# Patient Record
Sex: Female | Born: 1970 | Race: Black or African American | Hispanic: No | Smoking: Never smoker
Health system: Southern US, Community
[De-identification: ages and names within clinical notes are randomized; demographics above are authoritative.]

## PROBLEM LIST (undated history)

## (undated) DIAGNOSIS — M419 Scoliosis, unspecified: Secondary | ICD-10-CM

## (undated) DIAGNOSIS — M1990 Unspecified osteoarthritis, unspecified site: Secondary | ICD-10-CM

## (undated) DIAGNOSIS — Z789 Other specified health status: Secondary | ICD-10-CM

## (undated) DIAGNOSIS — T8859XA Other complications of anesthesia, initial encounter: Secondary | ICD-10-CM

## (undated) DIAGNOSIS — M542 Cervicalgia: Secondary | ICD-10-CM

## (undated) DIAGNOSIS — D219 Benign neoplasm of connective and other soft tissue, unspecified: Secondary | ICD-10-CM

## (undated) DIAGNOSIS — D649 Anemia, unspecified: Secondary | ICD-10-CM

## (undated) DIAGNOSIS — R51 Headache: Secondary | ICD-10-CM

## (undated) DIAGNOSIS — T4145XA Adverse effect of unspecified anesthetic, initial encounter: Secondary | ICD-10-CM

## (undated) DIAGNOSIS — I82409 Acute embolism and thrombosis of unspecified deep veins of unspecified lower extremity: Secondary | ICD-10-CM

## (undated) HISTORY — DX: Anemia, unspecified: D64.9

## (undated) HISTORY — PX: WISDOM TOOTH EXTRACTION: SHX21

## (undated) HISTORY — DX: Cervicalgia: M54.2

## (undated) HISTORY — PX: NECK SURGERY: SHX720

---

## 1999-02-05 ENCOUNTER — Inpatient Hospital Stay (HOSPITAL_COMMUNITY): Admission: AD | Admit: 1999-02-05 | Discharge: 1999-02-05 | Payer: Self-pay | Admitting: Obstetrics

## 1999-02-08 ENCOUNTER — Inpatient Hospital Stay (HOSPITAL_COMMUNITY): Admission: AD | Admit: 1999-02-08 | Discharge: 1999-02-08 | Payer: Self-pay | Admitting: Obstetrics & Gynecology

## 1999-07-05 ENCOUNTER — Emergency Department (HOSPITAL_COMMUNITY): Admission: EM | Admit: 1999-07-05 | Discharge: 1999-07-06 | Payer: Self-pay | Admitting: *Deleted

## 1999-09-16 ENCOUNTER — Inpatient Hospital Stay (HOSPITAL_COMMUNITY): Admission: EM | Admit: 1999-09-16 | Discharge: 1999-09-16 | Payer: Self-pay | Admitting: *Deleted

## 2000-08-30 ENCOUNTER — Other Ambulatory Visit: Admission: RE | Admit: 2000-08-30 | Discharge: 2000-08-30 | Payer: Self-pay | Admitting: *Deleted

## 2001-02-21 ENCOUNTER — Other Ambulatory Visit: Admission: RE | Admit: 2001-02-21 | Discharge: 2001-02-21 | Payer: Self-pay | Admitting: Obstetrics and Gynecology

## 2002-02-27 ENCOUNTER — Other Ambulatory Visit: Admission: RE | Admit: 2002-02-27 | Discharge: 2002-02-27 | Payer: Self-pay | Admitting: Obstetrics and Gynecology

## 2003-03-05 ENCOUNTER — Other Ambulatory Visit: Admission: RE | Admit: 2003-03-05 | Discharge: 2003-03-05 | Payer: Self-pay | Admitting: Obstetrics and Gynecology

## 2003-10-23 ENCOUNTER — Emergency Department (HOSPITAL_COMMUNITY): Admission: EM | Admit: 2003-10-23 | Discharge: 2003-10-24 | Payer: Self-pay | Admitting: Emergency Medicine

## 2004-03-11 ENCOUNTER — Other Ambulatory Visit: Admission: RE | Admit: 2004-03-11 | Discharge: 2004-03-11 | Payer: Self-pay | Admitting: Obstetrics and Gynecology

## 2004-08-23 ENCOUNTER — Emergency Department (HOSPITAL_COMMUNITY): Admission: EM | Admit: 2004-08-23 | Discharge: 2004-08-24 | Payer: Self-pay | Admitting: Family Medicine

## 2004-09-26 ENCOUNTER — Ambulatory Visit (HOSPITAL_COMMUNITY): Admission: RE | Admit: 2004-09-26 | Discharge: 2004-09-26 | Payer: Self-pay | Admitting: Gastroenterology

## 2005-03-12 ENCOUNTER — Other Ambulatory Visit: Admission: RE | Admit: 2005-03-12 | Discharge: 2005-03-12 | Payer: Self-pay | Admitting: Obstetrics and Gynecology

## 2005-11-19 ENCOUNTER — Ambulatory Visit (HOSPITAL_COMMUNITY): Admission: AD | Admit: 2005-11-19 | Discharge: 2005-11-20 | Payer: Self-pay | Admitting: Obstetrics and Gynecology

## 2005-11-19 ENCOUNTER — Encounter (INDEPENDENT_AMBULATORY_CARE_PROVIDER_SITE_OTHER): Payer: Self-pay | Admitting: Specialist

## 2006-04-26 ENCOUNTER — Inpatient Hospital Stay (HOSPITAL_COMMUNITY): Admission: AD | Admit: 2006-04-26 | Discharge: 2006-04-26 | Payer: Self-pay | Admitting: Obstetrics and Gynecology

## 2006-09-12 ENCOUNTER — Emergency Department (HOSPITAL_COMMUNITY): Admission: EM | Admit: 2006-09-12 | Discharge: 2006-09-12 | Payer: Self-pay | Admitting: Emergency Medicine

## 2007-10-03 ENCOUNTER — Emergency Department (HOSPITAL_COMMUNITY): Admission: EM | Admit: 2007-10-03 | Discharge: 2007-10-03 | Payer: Self-pay | Admitting: Emergency Medicine

## 2008-03-23 HISTORY — PX: CERVICAL SPINE SURGERY: SHX589

## 2008-06-11 ENCOUNTER — Emergency Department (HOSPITAL_COMMUNITY): Admission: EM | Admit: 2008-06-11 | Discharge: 2008-06-11 | Payer: Self-pay | Admitting: Family Medicine

## 2009-01-02 ENCOUNTER — Emergency Department (HOSPITAL_COMMUNITY): Admission: EM | Admit: 2009-01-02 | Discharge: 2009-01-02 | Payer: Self-pay | Admitting: Family Medicine

## 2009-01-22 ENCOUNTER — Emergency Department (HOSPITAL_COMMUNITY): Admission: EM | Admit: 2009-01-22 | Discharge: 2009-01-22 | Payer: Self-pay | Admitting: Family Medicine

## 2009-02-01 ENCOUNTER — Encounter
Admission: RE | Admit: 2009-02-01 | Discharge: 2009-03-01 | Payer: Self-pay | Admitting: Physical Medicine and Rehabilitation

## 2009-04-03 ENCOUNTER — Inpatient Hospital Stay (HOSPITAL_COMMUNITY): Admission: RE | Admit: 2009-04-03 | Discharge: 2009-04-05 | Payer: Self-pay | Admitting: Orthopedic Surgery

## 2009-06-07 ENCOUNTER — Encounter: Admission: RE | Admit: 2009-06-07 | Discharge: 2009-06-27 | Payer: Self-pay | Admitting: Orthopedic Surgery

## 2009-06-21 ENCOUNTER — Emergency Department (HOSPITAL_COMMUNITY): Admission: EM | Admit: 2009-06-21 | Discharge: 2009-06-21 | Payer: Self-pay | Admitting: Family Medicine

## 2009-11-01 DIAGNOSIS — E669 Obesity, unspecified: Secondary | ICD-10-CM | POA: Insufficient documentation

## 2009-11-01 DIAGNOSIS — D649 Anemia, unspecified: Secondary | ICD-10-CM | POA: Insufficient documentation

## 2010-03-14 ENCOUNTER — Encounter
Admission: RE | Admit: 2010-03-14 | Discharge: 2010-03-14 | Payer: Self-pay | Source: Home / Self Care | Attending: Family Medicine | Admitting: Family Medicine

## 2010-04-21 DIAGNOSIS — I10 Essential (primary) hypertension: Secondary | ICD-10-CM | POA: Insufficient documentation

## 2010-04-23 DIAGNOSIS — E559 Vitamin D deficiency, unspecified: Secondary | ICD-10-CM | POA: Insufficient documentation

## 2010-05-23 ENCOUNTER — Inpatient Hospital Stay (INDEPENDENT_AMBULATORY_CARE_PROVIDER_SITE_OTHER)
Admission: RE | Admit: 2010-05-23 | Discharge: 2010-05-23 | Disposition: A | Discharge status: Home / Self Care | Payer: Medicaid Other | Source: Ambulatory Visit | Attending: Family Medicine | Admitting: Family Medicine

## 2010-05-23 DIAGNOSIS — S239XXA Sprain of unspecified parts of thorax, initial encounter: Secondary | ICD-10-CM

## 2010-05-29 DIAGNOSIS — M545 Low back pain, unspecified: Secondary | ICD-10-CM | POA: Insufficient documentation

## 2010-06-02 ENCOUNTER — Inpatient Hospital Stay (INDEPENDENT_AMBULATORY_CARE_PROVIDER_SITE_OTHER)
Admission: RE | Admit: 2010-06-02 | Discharge: 2010-06-02 | Disposition: A | Discharge status: Home / Self Care | Payer: Medicaid Other | Source: Ambulatory Visit | Attending: Emergency Medicine | Admitting: Emergency Medicine

## 2010-06-02 DIAGNOSIS — M545 Low back pain: Secondary | ICD-10-CM

## 2010-06-02 LAB — POCT PREGNANCY, URINE: Preg Test, Ur: NEGATIVE

## 2010-06-02 LAB — POCT URINALYSIS DIPSTICK
Ketones, ur: NEGATIVE mg/dL
Protein, ur: NEGATIVE mg/dL
Urobilinogen, UA: 0.2 mg/dL (ref 0.0–1.0)

## 2010-06-08 LAB — POCT I-STAT 4, (NA,K, GLUC, HGB,HCT): Glucose, Bld: 80 mg/dL (ref 70–99)

## 2010-08-08 NOTE — H&P (Signed)
[name], [name]               ACCOUNT NO.:  [id number]   MEDICAL RECORD NO.:  [id number]          PATIENT TYPE:  AMB   LOCATION:  SDC                           FACILITY:  [facility]   PHYSICIAN:  Hal Morales, M.D.DATE OF BIRTH:  August 23, 1970   DATE OF ADMISSION:  11/19/2005  DATE OF DISCHARGE:                                HISTORY & PHYSICAL   HISTORY OF PRESENT ILLNESS:  The patient is a 40 year old black female, para  1-0-0-1, who presents for hysteroscopy and resection of any submucosal  fibroids for the treatment of menorrhagia.  The patient has had problems  with menorrhagia since at least 2005.  At that time, she was placed on oral  contraceptive pills to try and control the amount of bleeding that she had  with her menses.  She was already noted at that time to be anemic and was on  iron.  She did not have the desired improvement on oral contraceptive pills  and was subsequently placed on Depo-Provera in hopes of having improvement  in her periods.  She still had no significant improvement in that she  continued to bleed heavily and with the Depo-Provera, unpredictably.  She  had a complete anemia workup with her primary care physician, Dr. Renae Gloss,  including blood indices and panendoscopy.  These were all within normal  limits and the blood workup showed evidence of iron-deficiency anemia.  She  had a normal TSH as well.  The patient has known about fibroids in her  uterus since 2003.  She had a followup ultrasound for evaluation of those  fibroids in January 2007, at which time 4 fibroids could be measured with 1  thought to be submucosal and measuring 2.2-cm.   The patient wishes to maintain childbearing potential, however, does not  desire pregnancy at this time.  She had her last Depo-Provera injection in  July 2007.   PAST MEDICAL HISTORY:   GYNECOLOGIC:  1. The patient underwent menarche at age 27 and had menses every month,      lasting for approximately 4  days, up until she began having difficulty      with her menses in 2005.  2. She has had normal Pap smears with her last Pap smear done December      2006.  3. She has recently had negative GC and Chlamydia cultures and has no      history of sexually transmitted infection.  4. Her only means of birth control has been condoms, birth control pills,      and Depo-Provera.   MEDICAL HISTORY:  The patient denies history of hypertension or diabetes.   FAMILY HISTORY:  Essentially negative.   SURGICAL HISTORY:  C-section in 1996.   OBSTETRICAL HISTORY:  Single pregnancy, delivered by cesarean section in  1996, without other complications of pregnancy.   CURRENT MEDICATIONS:  1. Iron.  2. Motrin 800 mg p.r.n. dysmenorrhea.  3. Vicodin p.r.n. dysmenorrhea.   SOCIAL HISTORY:  The patient works as a Social worker.  She is single.  She  does not smoke cigarettes nor does she drink  alcohol.   DRUG ALLERGIES:  1. IODINE.  2. IVP CONTRAST.   REVIEW OF SYSTEMS:  Positive for menstrual cramps and the aforementioned  menorrhagia.  The patient specifically denies any lightheadedness or  fatigue.   PHYSICAL EXAMINATION:  GENERAL:  The patient is an obese black female in no  acute distress.  VITAL SIGNS:  The blood pressure is 120/80.  LUNGS:  Clear.  HEART:  Regular rate and rhythm.  ABDOMEN:  Obese without palpable masses or organomegaly, though the  examination is limited by body habitus.  PELVIC:  EG/BUS within normal limits.  The vagina is rugose.  The cervix is  without gross lesions.  The uterus does not feel significantly enlarged  though the examination is limited by body habitus.  Adnexa no separable  masses.  Rectovaginal:  No masses.   IMPRESSION:  1. Menorrhagia which has not been responsive to oral contraceptive pills      or Depo-Provera.  2. Uterine fibroids, probably submucosal.  3. Anemia.  4. Desire to maintain fertility.   DISPOSITION:  Several discussions have  been held with the patient concerning  options for management and she has decided on hysteroscopy D&C with fibroid  resection.  The risks of anesthesia, bleeding, infection, damage to adjacent  organs, and the possibility of uterine perforation have been discussed with  the patient and she wishes to proceed with hysteroscopy D&C, fibroid  resection on November 19, 2005.      Hal Morales, M.D.  Electronically Signed     VPH/MEDQ  D:  11/18/2005  T:  11/18/2005  Job:  [id number]

## 2010-08-08 NOTE — Op Note (Signed)
[name], [name]                ACCOUNT NO.:  [id number]   MEDICAL RECORD NO.:  [id number]          PATIENT TYPE:  AMB   LOCATION:  ENDO                         FACILITY:  [facility]   PHYSICIAN:  Anselmo Rod, M.D.  DATE OF BIRTH:  [birth year]   DATE OF PROCEDURE:  09/26/2004  DATE OF DISCHARGE:                                 OPERATIVE REPORT   PROCEDURE PERFORMED:  Esophagogastroduodenoscopy.   ENDOSCOPIST:  Anselmo Rod, M.D.   INSTRUMENT USED:  Olympus video panendoscope.   INDICATIONS FOR PROCEDURE:  A 40 year old [place] American female with iron  deficiency and rectal bleeding during the prep.  Rule out peptic ulcer  disease, esophagitis, gastritis, etc.   PREPROCEDURE PREPARATION:  Informed consent was procured from the patient.  The patient fasted for eight hours prior to the procedure and prepped with a  bottle of magnesium citrate and a gallon of GoLYTELY the night prior to the  procedure.  Risks and benefits of the procedure were discussed with the  patient as well.   PREPROCEDURE PHYSICAL EXAMINATION:  VITAL SIGNS:  Stable.  NECK:  Supple.  CHEST:  Clear to auscultation.  CARDIOVASCULAR:  S1 and S2 regular.  ABDOMEN: Soft, non-tender with NABS.   DESCRIPTION OF PROCEDURE:  The patient was placed in left lateral decubitus  position, sedated with 50 mg of Demerol and 5 mg of Versed in slow  incremental doses.  Once the patient was adequately sedated and maintained  on low flow oxygen and continuous cardiac monitoring, the Olympus video  panendoscope was advanced through the mouthpiece, over the tongue and into  the esophagus under direct vision.  The entire esophagus appeared normal  with no evidence of ring, stricture, masses, esophagitis or Barrett's  mucosa.  The scope was then advanced into the stomach.  The entire gastric  mucosa and the proximal small-bowel appeared normal.  Retroflexion in the  high cardia revealed no abnormalities.   IMPRESSION:   Normal esophagogastroduodenoscopy.   RECOMMENDATIONS:  Proceed with colonoscopy at this time.  Further  recommendation will be made thereafter.       JNM/MEDQ  D:  09/26/2004  T:  09/26/2004  Job:  [id number]   cc:   Merlene Laughter. Renae Gloss, M.D.  [address]  [address]  [place]  [place] [address]  Fax: [phone]

## 2010-08-08 NOTE — Op Note (Signed)
[name], [name]                ACCOUNT NO.:  [id number]   MEDICAL RECORD NO.:  [id number]          PATIENT TYPE:  AMB   LOCATION:                               FACILITY:  [facility]   PHYSICIAN:  Anselmo Rod, M.D.  DATE OF BIRTH:  [birth year]   DATE OF PROCEDURE:  09/26/2004  DATE OF DISCHARGE:                                 OPERATIVE REPORT   PROCEDURE:  Colonoscopy and endoscopy.   ENDOSCOPIST:  Anselmo Rod, M.D.   INSTRUMENT:  Olympus videocolonoscope.   INDICATIONS FOR PROCEDURE:  A 40 year old African-American female with the  history of iron-deficiency anemia to rule out colonic polyps, masses,  etcetera.  The patient had significant rectal bleeding during the  preparation for the colonoscopy yesterday.   PREPROCEDURE PREPARATION:  Informed consent was procured from the patient.  The patient was fasted for 8 hours prior to the procedure and prepped with a  bottle of magnesium citrate in a gallon of GoLYTELY the night prior to the  procedure.  The risks and benefits of the procedure including a 10% missed  rate of cancer and polyps was discussed with her as well.   PREPROCEDURE PHYSICAL:  VITAL SIGNS: The patient with stable vital signs.  NECK:  Supple.  CHEST:  Clear to auscultation.  HEART:  S1, S2 regular.  ABDOMEN:  Soft with normal bowel sounds.   DESCRIPTION OF PROCEDURE:  The patient was placed in the left lateral  decubitus position and sedated with 50 mg of Demerol and 5 mg of Versed in  addition to the medications she received for the EGD.  Once the patient was  adequately sedated and maintained on low-flow oxygen, and continuous cardiac  monitoring; the Olympus videocolonoscope was advanced from the rectum to the  cecum.  The appendiceal orifice and ileocecal valve were visualized and  photographed.  No masses, polyps, erosions, or ulcerations; except we saw a  couple of diverticula in the right colon.  The rest of the exam was  unremarkable.   Retroflexion in the rectum revealed no small internal  hemorrhoids.  A large external hemorrhoid was seen on anal inspection which  is the source of the patient's rectal bleeding which she had during the  prep.  The patient tolerated the procedure well without complications.   IMPRESSION:  1.  A few right-sided diverticula.  2.  Small internal hemorrhoids.  3.  Large external hemorrhoid.   RECOMMENDATIONS:  1.  Serial CBCs will be done.  2.  Outpatient followup in the next 4 weeks for further recommendations.  3.  Sitz baths along with local application of Anusol HC 2.5% cream has been      advised.       JNM/MEDQ  D:  09/26/2004  T:  09/26/2004  Job:  [id number]   cc:   Merlene Laughter. Renae Gloss, M.D.  [address]  [address]  [place]  [place] [address]  Fax: [phone]

## 2010-08-08 NOTE — Op Note (Signed)
[name], [name]               ACCOUNT NO.:  [id number]   MEDICAL RECORD NO.:  [id number]          PATIENT TYPE:  OBV   LOCATION:  9309                          FACILITY:  [facility]   PHYSICIAN:  Hal Morales, M.D.DATE OF BIRTH:  01/05/1971   DATE OF PROCEDURE:  11/19/2005  DATE OF DISCHARGE:                                 OPERATIVE REPORT   PREOPERATIVE DIAGNOSES:  1. Abnormal uterine bleeding.  2. Uterine fibroids.  3. Left vulvar lesion of 2 days' duration.   POSTOPERATIVE DIAGNOSES:  1. Abnormal uterine bleeding.  2. Uterine fibroids.  3. Left vulvar lesion of 2 days' duration.   OPERATIONS:  1. Hysteroscopy.  2. Uterine fibroid resection.   SURGEON:  Dr. Dierdre Forth.   ANESTHESIA:  General orotracheal.   ESTIMATED BLOOD LOSS:  Less than 50 mL.   COMPLICATIONS:  None.   FINDINGS:  The uterus sounded to 8 cm.  The cavity contained a small,  approximately 2 cm, fibroid in the left posterior uterus.  No other lesions  were noted.   PROCEDURE:  The patient was taken to the operating room, after appropriate  identification, and placed on operating table.  After the attainment of  adequate general anesthesia, she was placed in the lithotomy position.  The  perineum and vagina were prepped with multiple layers of Techni-Care.  A red  Robinson catheter was used to empty the bladder.  Perineum was draped as a  sterile field.  A Graves' speculum was placed in the vagina and a  paracervical block achieved with a total of 10 mL of 2% Xylocaine in the 5  and 7 o'clock positions.  A single-tooth tenaculum was placed on the  anterior cervix.  The uterus was sounded.  This cervix was then dilated to  accommodate the diagnostic hysteroscope, and it was used to document the  above noted findings.  The resectoscope was then placed, after the cervix  had been further dilated, and the fibroid was resected.  A sharp curette was  then used to curet all quadrants of the uterus.   Once hemostasis was noted  to be adequate, all instruments were removed from the vagina.  There was  noted, on the left labium, an ulcerated lesion which was approximately 3 mm.  All instruments were removed from the vagina and the patient was awakened  from general anesthesia and taken to the  recovery room in satisfactory condition, having tolerated the procedure  well, with sponge and instrument counts correct.   SPECIMENS TO PATHOLOGY:  Endometrial curettings and uterine fibroids.      Hal Morales, M.D.  Electronically Signed     VPH/MEDQ  D:  11/19/2005  T:  11/20/2005  Job:  [id number]

## 2011-02-14 ENCOUNTER — Encounter: Payer: Self-pay | Admitting: Emergency Medicine

## 2011-02-14 ENCOUNTER — Emergency Department (HOSPITAL_COMMUNITY)
Admission: EM | Admit: 2011-02-14 | Discharge: 2011-02-14 | Disposition: A | Discharge status: Home / Self Care | Payer: Medicaid Other | Source: Home / Self Care | Attending: Family Medicine | Admitting: Family Medicine

## 2011-02-14 DIAGNOSIS — R609 Edema, unspecified: Secondary | ICD-10-CM

## 2011-02-14 DIAGNOSIS — R600 Localized edema: Secondary | ICD-10-CM

## 2011-02-14 MED ORDER — HYDROCODONE-ACETAMINOPHEN 5-325 MG PO TABS: ORAL_TABLET | ORAL | Status: AC

## 2011-02-14 NOTE — ED Notes (Signed)
C/o right leg pain.  No known injury.  Patient reports right leg pain onset last week, pain from the knee down.  Pain was constant.  Lower leg numb today, now more painful than numb, swollen, pulses present.  Patient and mother noted area of bruising anterior right leg, below knee.

## 2011-02-14 NOTE — ED Provider Notes (Signed)
History     CSN: [id number] Arrival date & time: 02/14/2011  2:57 PM   First MD Initiated Contact with Patient 02/14/11 1442      Chief Complaint  Patient presents with  . 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 female presenting with leg pain. The history is provided by the patient.  Leg Pain  The incident occurred more than 1 week ago. The pain is present in the right leg and right knee. The pain is moderate.    History reviewed. No pertinent past medical history.  Past Surgical History  Procedure Date  . Neck surgery     History reviewed. No pertinent family history.  History  Substance Use Topics  . Smoking status: Never Smoker   . Smokeless tobacco: Not on file  . Alcohol Use: No    OB History    Grav Para Term Preterm Abortions TAB SAB Ect Mult Living                  Review of Systems  Constitutional: Negative.   HENT: Negative.   Eyes: Negative.   Respiratory: Negative.   Cardiovascular: Negative.   Gastrointestinal: Negative.   Genitourinary: Negative.   Musculoskeletal: Positive for myalgias and arthralgias.  Skin: Negative.   Neurological: Negative.   Psychiatric/Behavioral: Negative.     Allergies  Iodine  Home Medications   Current Outpatient Rx  Name Route Sig Dispense Refill  . HYDROCODONE-ACETAMINOPHEN 5-325 MG PO TABS  Take one to two tablets every 4 to 6 hours as needed for pain 20 tablet 0    BP 116/75  Pulse 83  Temp(Src) 98.9 F (37.2 C) (Oral)  Resp 18  SpO2 99%  LMP 12/15/2010  Physical  Exam  Nursing note and vitals reviewed. Constitutional: She is oriented to person, place, and time. She appears well-developed and well-nourished.  HENT:  Head: Normocephalic and atraumatic.  Eyes: EOM are normal.  Neck: Normal range of motion.  Musculoskeletal:       Legs: Neurological: She is alert and oriented to person, place, and time.  Skin: Skin is warm and dry.    ED Course  Procedures (including critical care time)  Labs Reviewed - No data to display No results found.   1. Edema leg       MDM          Richardo Priest, MD 02/14/11 2049

## 2011-03-09 ENCOUNTER — Encounter (HOSPITAL_COMMUNITY): Payer: Self-pay | Admitting: *Deleted

## 2011-03-09 ENCOUNTER — Inpatient Hospital Stay (HOSPITAL_COMMUNITY): Payer: Medicaid Other

## 2011-03-09 ENCOUNTER — Inpatient Hospital Stay (HOSPITAL_COMMUNITY)
Admission: AD | Admit: 2011-03-09 | Discharge: 2011-03-09 | Disposition: A | Discharge status: Home / Self Care | Payer: Medicaid Other | Source: Ambulatory Visit | Attending: Obstetrics & Gynecology | Admitting: Obstetrics & Gynecology

## 2011-03-09 DIAGNOSIS — D219 Benign neoplasm of connective and other soft tissue, unspecified: Secondary | ICD-10-CM

## 2011-03-09 DIAGNOSIS — D259 Leiomyoma of uterus, unspecified: Secondary | ICD-10-CM

## 2011-03-09 DIAGNOSIS — N949 Unspecified condition associated with female genital organs and menstrual cycle: Secondary | ICD-10-CM | POA: Insufficient documentation

## 2011-03-09 DIAGNOSIS — N898 Other specified noninflammatory disorders of vagina: Secondary | ICD-10-CM

## 2011-03-09 DIAGNOSIS — I82409 Acute embolism and thrombosis of unspecified deep veins of unspecified lower extremity: Secondary | ICD-10-CM | POA: Insufficient documentation

## 2011-03-09 DIAGNOSIS — N939 Abnormal uterine and vaginal bleeding, unspecified: Secondary | ICD-10-CM

## 2011-03-09 DIAGNOSIS — N938 Other specified abnormal uterine and vaginal bleeding: Secondary | ICD-10-CM | POA: Insufficient documentation

## 2011-03-09 HISTORY — DX: Benign neoplasm of connective and other soft tissue, unspecified: D21.9

## 2011-03-09 HISTORY — DX: Acute embolism and thrombosis of unspecified deep veins of unspecified lower extremity: I82.409

## 2011-03-09 LAB — DIFFERENTIAL
Basophils Absolute: 0 10*3/uL (ref 0.0–0.1)
Eosinophils Absolute: 0.1 10*3/uL (ref 0.0–0.7)
Eosinophils Relative: 1 % (ref 0–5)

## 2011-03-09 LAB — URINALYSIS, ROUTINE W REFLEX MICROSCOPIC
Glucose, UA: NEGATIVE mg/dL
Specific Gravity, Urine: 1.025 (ref 1.005–1.030)

## 2011-03-09 LAB — PROTIME-INR: Prothrombin Time: 13.2 seconds (ref 11.6–15.2)

## 2011-03-09 LAB — CBC
MCH: 29.5 pg (ref 26.0–34.0)
MCV: 89 fL (ref 78.0–100.0)
Platelets: 208 10*3/uL (ref 150–400)
RDW: 13.2 % (ref 11.5–15.5)

## 2011-03-09 LAB — WET PREP, GENITAL
Trich, Wet Prep: NONE SEEN
Yeast Wet Prep HPF POC: NONE SEEN

## 2011-03-09 LAB — POCT PREGNANCY, URINE: Preg Test, Ur: NEGATIVE

## 2011-03-09 NOTE — Progress Notes (Signed)
Pt states heavy bleeding x2 weeks, changing pad/tampon q65minutes-1hour. Large clots noted. Denies vag d/c changes. Lower abd pain rates 10/10.

## 2011-03-09 NOTE — Progress Notes (Signed)
States bleeding started when IUD removed 2 wks ago. Had for 2 years and was placed by Dr. Pennie Rushing. Removed by Dr. Altamese Dilling.

## 2011-03-09 NOTE — ED Provider Notes (Signed)
History     CSN: [id number] Arrival date & time: 03/09/2011 11:11 AM   None     Chief Complaint  Patient presents with  . Vaginal Bleeding    HPI [name]  Is a 40 y.o. female who presents to [facility] for vaginal bleeding. Last sexual intercourse 4 months ago. Current sex partner x 2 years. No history of STD's. She has used IUD for birth control for almost 3 years. Last month she went to Urgent Care for pain and swelling in her right calf. She was scheduled for doppler study and found to have a DVT. She followed up with her PCP and had the IUD removed and started on Coumadin 2 weeks ago. Since that time she has had vaginal bleeding. She has a history of uterine fibroid that was removed by Dr. Pennie Rushing a few years ago and was having irregular bleeding at that time. Her PCP instructed her to come to [facility] for evaluation since she does not currently have a GYN. The history was provided by the patient.  Past Medical History  Diagnosis Date  . DVT (deep venous thrombosis)   . Fibroids     Past Surgical History  Procedure Date  . Neck surgery   . Cervical spine surgery   . Myomectomy   . Cesarean section   . Wisdom tooth extraction     Family History  Problem Relation Age of Onset  . Anesthesia problems Neg Hx     History  Substance Use Topics  . Smoking status: Never Smoker   . Smokeless tobacco: Never Used  . Alcohol Use: No    OB History    Grav Para Term Preterm Abortions TAB SAB Ect Mult Living   2 1 1  0 1 0 1 0 0 1      Review of Systems  Constitutional: Negative for fever, chills, diaphoresis and fatigue.       Obese   HENT: Negative for ear pain, congestion, sore throat, facial swelling, neck pain, neck stiffness, dental problem and sinus pressure.   Eyes: Negative for photophobia, pain and discharge.  Respiratory: Negative for cough, chest tightness and wheezing.   Cardiovascular: Positive for leg swelling. Negative for chest pain and  palpitations.  Gastrointestinal: Negative for nausea, vomiting, diarrhea, constipation and abdominal distention.       Occasional cramping  Genitourinary: Positive for vaginal bleeding. Negative for dysuria, frequency, flank pain and difficulty urinating.  Musculoskeletal: Negative for myalgias, back pain and gait problem.  Skin: Negative for color change and rash.  Neurological: Negative for dizziness, speech difficulty, weakness, light-headedness, numbness and headaches.  Psychiatric/Behavioral: Negative for confusion and agitation. The patient is not nervous/anxious.     Allergies  Iodine and Shrimp  Home Medications  No current outpatient prescriptions on file.  BP 123/82  Pulse 69  Temp(Src) 98.2 F (36.8 C) (Oral)  Resp 16  Ht 5\' 5"  (1.651 m)  Wt 235 lb 4 oz (106.709 kg)  BMI 39.15 kg/m2  SpO2 97%  LMP 02/21/2011  Physical Exam  Nursing note and vitals reviewed. Constitutional: She is oriented to person, place, and time. She appears well-developed and well-nourished.  HENT:  Head: Normocephalic.  Eyes: EOM are normal.  Neck: Neck supple.  Cardiovascular: Normal rate.   Pulmonary/Chest: Effort normal. No respiratory distress.  Abdominal: Soft. There is no tenderness.  Genitourinary:       External genitalia without lesions. Small amount of blood in vaginal vault. No CMT, no adnexal  mass or tenderness palpated. Uterus non tender.  Musculoskeletal: Normal range of motion. She exhibits no edema.  Neurological: She is alert and oriented to person, place, and time. No cranial nerve deficit.  Skin: Skin is warm and dry.  Psychiatric: She has a normal mood and affect. Her behavior is normal. Judgment and thought content normal.   Results for orders placed during the hospital encounter of 03/09/11 (from the past 24 hour(s))  URINALYSIS, ROUTINE W REFLEX MICROSCOPIC     Status: Abnormal   Collection Time   03/09/11 11:45 AM      Component Value Range   Color, Urine YELLOW   YELLOW    APPearance HAZY (*) CLEAR    Specific Gravity, Urine 1.025  1.005 - 1.030    pH 6.0  5.0 - 8.0    Glucose, UA NEGATIVE  NEGATIVE (mg/dL)   Hgb urine dipstick LARGE (*) NEGATIVE    Bilirubin Urine NEGATIVE  NEGATIVE    Ketones, ur NEGATIVE  NEGATIVE (mg/dL)   Protein, ur NEGATIVE  NEGATIVE (mg/dL)   Urobilinogen, UA 2.0 (*) 0.0 - 1.0 (mg/dL)   Nitrite NEGATIVE  NEGATIVE    Leukocytes, UA NEGATIVE  NEGATIVE   URINE MICROSCOPIC-ADD ON     Status: Abnormal   Collection Time   03/09/11 11:45 AM      Component Value Range   Squamous Epithelial / LPF FEW (*) RARE    Crystals CA OXALATE CRYSTALS (*) NEGATIVE   POCT PREGNANCY, URINE     Status: Normal   Collection Time   03/09/11 11:53 AM      Component Value Range   Preg Test, Ur NEGATIVE    WET PREP, GENITAL     Status: Abnormal   Collection Time   03/09/11 12:09 PM      Component Value Range   Yeast, Wet Prep NONE SEEN  NONE SEEN    Trich, Wet Prep NONE SEEN  NONE SEEN    Clue Cells, Wet Prep FEW (*) NONE SEEN    WBC, Wet Prep HPF POC NONE SEEN  NONE SEEN   CBC     Status: Abnormal   Collection Time   03/09/11 12:12 PM      Component Value Range   WBC 5.4  4.0 - 10.5 (K/uL)   RBC 3.83 (*) 3.87 - 5.11 (MIL/uL)   Hemoglobin 11.3 (*) 12.0 - 15.0 (g/dL)   HCT 40.9 (*) 81.1 - 46.0 (%)   MCV 89.0  78.0 - 100.0 (fL)   MCH 29.5  26.0 - 34.0 (pg)   MCHC 33.1  30.0 - 36.0 (g/dL)   RDW 91.4  78.2 - 95.6 (%)   Platelets 208  150 - 400 (K/uL)  DIFFERENTIAL     Status: Normal   Collection Time   03/09/11 12:12 PM      Component Value Range   Neutrophils Relative 58  43 - 77 (%)   Neutro Abs 3.1  1.7 - 7.7 (K/uL)   Lymphocytes Relative 30  12 - 46 (%)   Lymphs Abs 1.6  0.7 - 4.0 (K/uL)   Monocytes Relative 11  3 - 12 (%)   Monocytes Absolute 0.6  0.1 - 1.0 (K/uL)   Eosinophils Relative 1  0 - 5 (%)   Eosinophils Absolute 0.1  0.0 - 0.7 (K/uL)   Basophils Relative 1  0 - 1 (%)   Basophils Absolute 0.0  0.0 - 0.1 (K/uL)    PROTIME-INR     Status:  Normal   Collection Time   03/09/11 12:12 PM      Component Value Range   Prothrombin Time 13.2  11.6 - 15.2 (seconds)   INR 0.98  0.00 - 1.49     US Transvaginal Non-ob  03/09/2011  *RADIOLOGY REPORT*  Clinical Data: Bleeding for 2 weeks with clots post Mirena removed 02/28/2011  TRANSABDOMINAL AND TRANSVAGINAL ULTRASOUND OF PELVIS Technique:  Both transabdominal and transvaginal ultrasound examinations of the pelvis were performed. Transabdominal technique was performed for global imaging of the pelvis including uterus, ovaries, adnexal regions, and pelvic cul-de-sac.  Comparison: None.   It was necessary to proceed with endovaginal exam following the transabdominal exam to visualize the myometrium, endometrium and adnexa.  Findings:  Uterus: Has a sagittal length of 9.0 cm, AP depth of 4.8 cm and a transverse width of 5.6 cm.  Several focal fibroids are identified with the largest mural, located in the left fundal region measuring 2.6 x 2.6 x 3.6 cm.  Two smaller mural fibroids are identified in the right lateral upper uterine segment measuring 1.8 x 1.7 x 2.1 cm and in the right lateral lower uterine segment measuring 1.9 x 1.8 x 1.7 cm.  Endometrium: Appears thin and echogenic with an AP width of 2.9 mm. No areas of focal thickening or heterogeneity are seen.  Right ovary:  Has a normal appearance measuring 2.3 x 1.3 x 2.0 cm  Left ovary: Has a normal appearance measuring 2.2 x 1.7 x 1.4 cm  Other findings: No pelvic fluid or separate adnexal masses are seen.  IMPRESSION: Fibroid uterus with fibroid sizes and locations as noted above.  Normal endometrial stripe and ovaries.  Original Report Authenticated By: Bertha Stakes, M.D.   US Pelvis Complete  03/09/2011  *RADIOLOGY REPORT*  Clinical Data: Bleeding for 2 weeks with clots post Mirena removed 02/28/2011  TRANSABDOMINAL AND TRANSVAGINAL ULTRASOUND OF PELVIS Technique:  Both transabdominal and transvaginal  ultrasound examinations of the pelvis were performed. Transabdominal technique was performed for global imaging of the pelvis including uterus, ovaries, adnexal regions, and pelvic cul-de-sac.  Comparison: None.   It was necessary to proceed with endovaginal exam following the transabdominal exam to visualize the myometrium, endometrium and adnexa.  Findings:  Uterus: Has a sagittal length of 9.0 cm, AP depth of 4.8 cm and a transverse width of 5.6 cm.  Several focal fibroids are identified with the largest mural, located in the left fundal region measuring 2.6 x 2.6 x 3.6 cm.  Two smaller mural fibroids are identified in the right lateral upper uterine segment measuring 1.8 x 1.7 x 2.1 cm and in the right lateral lower uterine segment measuring 1.9 x 1.8 x 1.7 cm.  Endometrium: Appears thin and echogenic with an AP width of 2.9 mm. No areas of focal thickening or heterogeneity are seen.  Right ovary:  Has a normal appearance measuring 2.3 x 1.3 x 2.0 cm  Left ovary: Has a normal appearance measuring 2.2 x 1.7 x 1.4 cm  Other findings: No pelvic fluid or separate adnexal masses are seen.  IMPRESSION: Fibroid uterus with fibroid sizes and locations as noted above.  Normal endometrial stripe and ovaries.  Original Report Authenticated By: Bertha Stakes, M.D.    ED Course: discussed with Dr. Debroah Loop  Procedures   Assessment: Uterine fibroids   Abnormal vaginal bleeding   DVT right leg (currently under treatment)  Plan:  Follow up with GYN Clinic   Return here as needed. MDM  [place], NP 03/09/11 [address], NP 03/09/11 1446

## 2011-03-10 LAB — GC/CHLAMYDIA PROBE AMP, GENITAL
Chlamydia, DNA Probe: NEGATIVE
GC Probe Amp, Genital: NEGATIVE

## 2011-03-20 ENCOUNTER — Encounter: Payer: Self-pay | Admitting: Obstetrics & Gynecology

## 2011-03-20 ENCOUNTER — Ambulatory Visit (INDEPENDENT_AMBULATORY_CARE_PROVIDER_SITE_OTHER): Payer: Medicaid Other | Admitting: Obstetrics & Gynecology

## 2011-03-20 VITALS — BP 120/82 | HR 85 | Temp 97.1°F | Ht 65.0 in | Wt 234.9 lb

## 2011-03-20 DIAGNOSIS — D259 Leiomyoma of uterus, unspecified: Secondary | ICD-10-CM

## 2011-03-20 NOTE — Patient Instructions (Signed)
Fibroids You have been diagnosed as having a fibroid. Fibroids are smooth muscle lumps (tumors) which can occur any place in a woman's body. They are usually in the womb (uterus). The most common problem (symptom) of fibroids is bleeding. Over time this may cause low red blood cells (anemia). Other symptoms include feelings of pressure and pain in the pelvis. The diagnosis (learning what is wrong) of fibroids is made by physical exam. Sometimes tests such as an ultrasound are used. This is helpful when fibroids are felt around the ovaries and to look for tumors. TREATMENT   Most fibroids do not need surgical or medical treatment. Sometimes a tissue sample (biopsy) of the lining of the uterus is done to rule out cancer. If there is no cancer and only a small amount of bleeding, the problem can be watched.   Hormonal treatment can improve the problem.   When surgery is needed, it can consist of removing the fibroid. Vaginal birth may not be possible after the removal of fibroids. This depends on where they are and the extent of surgery. When pregnancy occurs with fibroids it is usually normal.   Your caregiver can help decide which treatments are best for you.  HOME CARE INSTRUCTIONS   Do not use aspirin as this may increase bleeding problems.   If your periods (menses) are heavy, record the number of pads or tampons used per month. Bring this information to your caregiver. This can help them determine the best treatment for you.  SEEK IMMEDIATE MEDICAL CARE IF:  You have pelvic pain or cramps not controlled with medications, or experience a sudden increase in pain.   You have an increase of pelvic bleeding between and during menses.   You feel lightheaded or have fainting spells.   You develop worsening belly (abdominal) pain.  Document Released: 03/06/2000 Document Revised: 11/19/2010 Document Reviewed: 10/27/2007 [employer] Patient Information 2012 [employer]. 

## 2011-03-20 NOTE — Progress Notes (Signed)
Subjective:    Patient ID: [name], female    DOB: 02-26-1971, 40 y.o.   MRN: [id number]  HPI Patient's last menstrual period was 02/21/2011. [id number] The patient comes today after having her Mirena removed about 2 weeks ago. She bled up until yesterday after that was removed. She also had some lower abdominal pain. She says she is currently not sexually active. She doesn't need any other birth control. Past Medical History  Diagnosis Date  . DVT (deep venous thrombosis)   . Fibroids   . Anemia    Past Surgical History  Procedure Date  . Neck surgery   . Cervical spine surgery   . Myomectomy   . Cesarean section   . Wisdom tooth extraction    Current Outpatient Prescriptions on File Prior to Visit  Medication Sig Dispense Refill  . IRON PO Take 1 tablet by mouth 2 (two) times daily.        Marland Kitchen warfarin (COUMADIN) 5 MG tablet Take 5 mg by mouth at bedtime. Pt takes 5mg  only on Monday, Friday, Saturday & Sundays        Allergies  Allergen Reactions  . Iodine Hives  . Shrimp (Shellfish Allergy) Hives   Family History  Problem Relation Age of Onset  . Anesthesia problems Neg Hx   . Hypertension Maternal Grandmother    History   Social History  . Marital Status: Legally Separated    Spouse Name: N/A    Number of Children: N/A  . Years of Education: N/A   Occupational History  . Not on file.   Social History Main Topics  . Smoking status: Never Smoker   . Smokeless tobacco: Never Used  . Alcohol Use: No  . Drug Use: No  . Sexually Active: Not Currently    Birth Control/ Protection: None     IUD removed 2 wks ago.   Other Topics Concern  . Not on file   Social History Narrative  . No narrative on file   Patient is a nonsmoker and she denies alcohol drug use.      Review of Systems Patient states that with the Mirena in place she would have to menstrual periods a month. No bleeding now. Still has some slight lower abdominal pain. No abnormal  discharge.                             Objective:   Physical Exam Filed Vitals:   03/20/11 1017  Height: 5\' 5"  (1.651 m)  Weight: 234 lb 14.4 oz (106.55 kg)   no acute distress normal affect Pel  *RADIOLOGY REPORT*  Clinical Data: Bleeding for 2 weeks with clots post Mirena removed  02/28/2011  TRANSABDOMINAL AND TRANSVAGINAL ULTRASOUND OF PELVIS  Technique: Both transabdominal and transvaginal ultrasound  examinations of the pelvis were performed. Transabdominal technique  was performed for global imaging of the pelvis including uterus,  ovaries, adnexal regions, and pelvic cul-de-sac.  Comparison: None.  It was necessary to proceed with endovaginal exam following the  transabdominal exam to visualize the myometrium, endometrium and  adnexa.  Findings:  Uterus: Has a sagittal length of 9.0 cm, AP depth of 4.8 cm and a  transverse width of 5.6 cm. Several focal fibroids are identified  with the largest mural, located in the left fundal region measuring  2.6 x 2.6 x 3.6 cm. Two smaller mural fibroids are identified in  the right lateral upper uterine segment  measuring 1.8 x 1.7 x 2.1  cm and in the right lateral lower uterine segment measuring 1.9 x  1.8 x 1.7 cm.  Endometrium: Appears thin and echogenic with an AP width of 2.9 mm.  No areas of focal thickening or heterogeneity are seen.  Right ovary: Has a normal appearance measuring 2.3 x 1.3 x 2.0 cm  Left ovary: Has a normal appearance measuring 2.2 x 1.7 x 1.4 cm  Other findings: No pelvic fluid or separate adnexal masses are  seen.  IMPRESSION:  Fibroid uterus with fibroid sizes and locations as noted above.  Normal endometrial stripe and ovaries.  Original Report Authenticated By: Bertha Stakes, M.D.      vic: External genitalia vagina and cervix appeared normal. Uterus is about 4-6 weeks size no adnexal masses or tenderness.         Assessment & Plan:  Patient has several  intramural fibroids no submucous fibroid on ultrasound. She was usually diagnosed with a blood clot in her right leg patient describes this as being superficial pretibial area. She is currently on Coumadin. She recently had her Mirena removed. She needs never control currently. I asked her to keep a menstrual calendar. She'll followup with her primary care doctors and she may request a consult from a vein specialist. To return here in 2 months. Dr. Scheryl Darter 03/20/2011

## 2011-03-24 HISTORY — PX: MYOMECTOMY: SHX85

## 2011-04-05 ENCOUNTER — Emergency Department (HOSPITAL_COMMUNITY)
Admission: EM | Admit: 2011-04-05 | Discharge: 2011-04-06 | Disposition: A | Discharge status: Home / Self Care | Payer: Medicaid Other | Attending: Emergency Medicine | Admitting: Emergency Medicine

## 2011-04-05 ENCOUNTER — Emergency Department (HOSPITAL_COMMUNITY): Payer: Medicaid Other

## 2011-04-05 ENCOUNTER — Encounter (HOSPITAL_COMMUNITY): Payer: Self-pay | Admitting: *Deleted

## 2011-04-05 DIAGNOSIS — R070 Pain in throat: Secondary | ICD-10-CM | POA: Insufficient documentation

## 2011-04-05 DIAGNOSIS — R05 Cough: Secondary | ICD-10-CM | POA: Insufficient documentation

## 2011-04-05 DIAGNOSIS — Z79899 Other long term (current) drug therapy: Secondary | ICD-10-CM | POA: Insufficient documentation

## 2011-04-05 DIAGNOSIS — R0602 Shortness of breath: Secondary | ICD-10-CM | POA: Insufficient documentation

## 2011-04-05 DIAGNOSIS — R059 Cough, unspecified: Secondary | ICD-10-CM | POA: Insufficient documentation

## 2011-04-05 DIAGNOSIS — N898 Other specified noninflammatory disorders of vagina: Secondary | ICD-10-CM | POA: Insufficient documentation

## 2011-04-05 DIAGNOSIS — M545 Low back pain, unspecified: Secondary | ICD-10-CM | POA: Insufficient documentation

## 2011-04-05 DIAGNOSIS — Z9889 Other specified postprocedural states: Secondary | ICD-10-CM | POA: Insufficient documentation

## 2011-04-05 DIAGNOSIS — R509 Fever, unspecified: Secondary | ICD-10-CM | POA: Insufficient documentation

## 2011-04-05 DIAGNOSIS — B9789 Other viral agents as the cause of diseases classified elsewhere: Secondary | ICD-10-CM | POA: Insufficient documentation

## 2011-04-05 DIAGNOSIS — J988 Other specified respiratory disorders: Secondary | ICD-10-CM

## 2011-04-05 DIAGNOSIS — Z86718 Personal history of other venous thrombosis and embolism: Secondary | ICD-10-CM | POA: Insufficient documentation

## 2011-04-05 MED ORDER — HYDROCODONE-ACETAMINOPHEN 5-325 MG PO TABS
2.0000 | ORAL_TABLET | Freq: Once | ORAL | Status: AC
  Administered 2011-04-05: 2 via ORAL
  Filled 2011-04-05: qty 2

## 2011-04-05 MED ORDER — HYDROMORPHONE HCL PF 2 MG/ML IJ SOLN
2.0000 mg | Freq: Once | INTRAMUSCULAR | Status: AC
  Administered 2011-04-05: 2 mg via INTRAMUSCULAR

## 2011-04-05 MED ORDER — ALBUTEROL SULFATE HFA 108 (90 BASE) MCG/ACT IN AERS
2.0000 | INHALATION_SPRAY | RESPIRATORY_TRACT | Status: DC | PRN
  Administered 2011-04-05: 2 via RESPIRATORY_TRACT
  Filled 2011-04-05: qty 6.7

## 2011-04-05 MED ORDER — HYDROMORPHONE HCL PF 2 MG/ML IJ SOLN
INTRAMUSCULAR | Status: AC
  Administered 2011-04-05: 2 mg via INTRAMUSCULAR
  Filled 2011-04-05: qty 1

## 2011-04-05 NOTE — ED Provider Notes (Signed)
Subjective patient with vomiting multiple times most likely secondary to gastroparesis Objective alert no distress Glasgow Coma Score 15 appears comfortable Assessment based on laboratory value a hypoglycemic episode in the emergency department ., Patient is mildly dehydrated Plan CDU protocol for dehydration, frequent Accu-Chek   Doug Sou, MD 04/05/11 2145

## 2011-04-05 NOTE — ED Notes (Signed)
Patient complaining of lower back pain; states that she came to the ED to rule out pneumonia.  Patient states that the lower back pain began this morning; rates pain 10/10 on the numerical pain scale.  Describes pain as "aching" and "sharp".  Complaining of cough and flu-like symptoms over the last week.  Patient given mask to wear.  Patient states that she has taken Flexeril, so she feels that it is not related to muscle-type pain.  Requesting x-ray of her lower back; patient denies injury to her lower back.  Patient alert and oriented x4; PERRL present.  Will continue to monitor.

## 2011-04-05 NOTE — ED Notes (Signed)
Patient currently sitting up in bed; no respiratory or acute distress noted.  Updated patient on plan of care; informed patient that we are currently waiting on EDP to write orders.  Patient has no other questions or concerns at this time; will continue to monitor.

## 2011-04-05 NOTE — ED Notes (Signed)
She has pain in her lower back since this am when she woke up.  She has had a cough for one week also

## 2011-04-05 NOTE — ED Provider Notes (Signed)
Please void note from 04/05/2011 at 9:43 PM in correct patient  Doug Sou, MD 04/05/11 2146

## 2011-04-05 NOTE — ED Provider Notes (Addendum)
History     CSN: [id number]  Arrival date & time 04/05/11  [name]   First MD Initiated Contact with Patient 04/05/11 2041      Chief Complaint  Patient presents with  . Back Pain    (Consider location/radiation/quality/duration/timing/severity/associated sxs/prior treatment) HPI Complains of nonproductive cough with fever for onset 3 days ago also complains of low nonradiating back pain onset 2 days ago .also complains a sore throat. No other complaint  Back pain is worse with moving improved with lying still no incontinence, no radiation no other associated symptoms pain is moderate to severe. She has treated herself with tussin and Tylenol without adequate relief Past Medical History  Diagnosis Date  . DVT (deep venous thrombosis)   . Fibroids   . Anemia     Past Surgical History  Procedure Date  . Neck surgery   . Cervical spine surgery   . Myomectomy   . Cesarean section   . Wisdom tooth extraction     Family History  Problem Relation Age of Onset  . Anesthesia problems Neg Hx   . Hypertension Maternal Grandmother     History  Substance Use Topics  . Smoking status: Never Smoker   . Smokeless tobacco: Never Used  . Alcohol Use: No    OB History    Grav Para Term Preterm Abortions TAB SAB Ect Mult Living   2 1 1  0 1 0 1 0 0 1      Review of Systems  Constitutional: Negative.   HENT: Negative.        Sore throat  Respiratory: Positive for cough.   Cardiovascular: Negative.   Gastrointestinal: Negative.   Musculoskeletal: Positive for back pain.  Skin: Negative.   Neurological: Negative.   Hematological: Negative.   Psychiatric/Behavioral: Negative.   All other systems reviewed and are negative.    Allergies  Iodine and Shrimp  Home Medications   Current Outpatient Rx  Name Route Sig Dispense Refill  . CYCLOBENZAPRINE HCL 5 MG PO TABS Oral Take 5 mg by mouth daily as needed. For muscle spasms    . IRON PO Oral Take 1 tablet by mouth 2 (two)  times daily.       BP 122/70  Pulse 97  Temp(Src) 98.4 F (36.9 C) (Oral)  Resp 16  SpO2 100%  LMP 04/01/2011  Physical Exam  Nursing note and vitals reviewed. Constitutional: She appears well-developed and well-nourished.  HENT:  Head: Normocephalic and atraumatic.  Eyes: Conjunctivae are normal. Pupils are equal, round, and reactive to light.  Neck: Neck supple. No tracheal deviation present. No thyromegaly present.  Cardiovascular: Normal rate and regular rhythm.   No murmur heard. Pulmonary/Chest: Effort normal. No respiratory distress.       Scant diffuse rhonchi  Abdominal: Soft. Bowel sounds are normal. She exhibits no distension. There is no tenderness.       OBese  Musculoskeletal: Normal range of motion. She exhibits no edema and no tenderness.       Entire spine nontender, and planes of pain at lumbar back when sitting up from a supine position  Neurological: She is alert. She displays normal reflexes. No cranial nerve deficit. Coordination normal.  Skin: Skin is warm and dry. No rash noted.  Psychiatric: She has a normal mood and affect.   gait is normal  ED Course  Procedures (including critical care time) Feels improved after treatment withLabs Reviewed - No data to display No results found. Intramuscular dilaudid and hydrocodone-A.  Pap No diagnosis found.  Results for orders placed during the hospital encounter of 03/09/11  URINALYSIS, ROUTINE W REFLEX MICROSCOPIC      Component Value Range   Color, Urine YELLOW  YELLOW    APPearance HAZY (*) CLEAR    Specific Gravity, Urine 1.025  1.005 - 1.030    pH 6.0  5.0 - 8.0    Glucose, UA NEGATIVE  NEGATIVE (mg/dL)   Hgb urine dipstick LARGE (*) NEGATIVE    Bilirubin Urine NEGATIVE  NEGATIVE    Ketones, ur NEGATIVE  NEGATIVE (mg/dL)   Protein, ur NEGATIVE  NEGATIVE (mg/dL)   Urobilinogen, UA 2.0 (*) 0.0 - 1.0 (mg/dL)   Nitrite NEGATIVE  NEGATIVE    Leukocytes, UA NEGATIVE  NEGATIVE   POCT PREGNANCY, URINE        Component Value Range   Preg Test, Ur NEGATIVE    GC/CHLAMYDIA PROBE AMP, GENITAL      Component Value Range   GC Probe Amp, Genital NEGATIVE  NEGATIVE    Chlamydia, DNA Probe NEGATIVE  NEGATIVE   WET PREP, GENITAL      Component Value Range   Yeast, Wet Prep NONE SEEN  NONE SEEN    Trich, Wet Prep NONE SEEN  NONE SEEN    Clue Cells, Wet Prep FEW (*) NONE SEEN    WBC, Wet Prep HPF POC NONE SEEN  NONE SEEN   CBC      Component Value Range   WBC 5.4  4.0 - 10.5 (K/uL)   RBC 3.83 (*) 3.87 - 5.11 (MIL/uL)   Hemoglobin 11.3 (*) 12.0 - 15.0 (g/dL)   HCT 46.9 (*) 62.9 - 46.0 (%)   MCV 89.0  78.0 - 100.0 (fL)   MCH 29.5  26.0 - 34.0 (pg)   MCHC 33.1  30.0 - 36.0 (g/dL)   RDW 52.8  41.3 - 24.4 (%)   Platelets 208  150 - 400 (K/uL)  DIFFERENTIAL      Component Value Range   Neutrophils Relative 58  43 - 77 (%)   Neutro Abs 3.1  1.7 - 7.7 (K/uL)   Lymphocytes Relative 30  12 - 46 (%)   Lymphs Abs 1.6  0.7 - 4.0 (K/uL)   Monocytes Relative 11  3 - 12 (%)   Monocytes Absolute 0.6  0.1 - 1.0 (K/uL)   Eosinophils Relative 1  0 - 5 (%)   Eosinophils Absolute 0.1  0.0 - 0.7 (K/uL)   Basophils Relative 1  0 - 1 (%)   Basophils Absolute 0.0  0.0 - 0.1 (K/uL)  PROTIME-INR      Component Value Range   Prothrombin Time 13.2  11.6 - 15.2 (seconds)   INR 0.98  0.00 - 1.49   URINE MICROSCOPIC-ADD ON      Component Value Range   Squamous Epithelial / LPF FEW (*) RARE    Crystals CA OXALATE CRYSTALS (*) NEGATIVE    Dg Chest 2 View  04/05/2011  *RADIOLOGY REPORT*  Clinical Data: Shortness of breath, cough, fever  CHEST - 2 VIEW  Comparison: None.  Findings: Decreased lung volumes with basilar atelectasis.  Right hemidiaphragm elevated.  Negative for pneumonia, collapse, consolidation, effusion or pneumothorax.  Lower cervical fusion hardware noted.  Trachea midline.  IMPRESSION: Bibasilar atelectasis.  Low volume exam.  Original Report Authenticated By: Judie Petit. Ruel Favors, M.D.   US  Transvaginal Non-ob  03/09/2011  *RADIOLOGY REPORT*  Clinical Data: Bleeding for 2 weeks with clots post Mirena removed  02/28/2011  TRANSABDOMINAL AND TRANSVAGINAL ULTRASOUND OF PELVIS Technique:  Both transabdominal and transvaginal ultrasound examinations of the pelvis were performed. Transabdominal technique was performed for global imaging of the pelvis including uterus, ovaries, adnexal regions, and pelvic cul-de-sac.  Comparison: None.   It was necessary to proceed with endovaginal exam following the transabdominal exam to visualize the myometrium, endometrium and adnexa.  Findings:  Uterus: Has a sagittal length of 9.0 cm, AP depth of 4.8 cm and a transverse width of 5.6 cm.  Several focal fibroids are identified with the largest mural, located in the left fundal region measuring 2.6 x 2.6 x 3.6 cm.  Two smaller mural fibroids are identified in the right lateral upper uterine segment measuring 1.8 x 1.7 x 2.1 cm and in the right lateral lower uterine segment measuring 1.9 x 1.8 x 1.7 cm.  Endometrium: Appears thin and echogenic with an AP width of 2.9 mm. No areas of focal thickening or heterogeneity are seen.  Right ovary:  Has a normal appearance measuring 2.3 x 1.3 x 2.0 cm  Left ovary: Has a normal appearance measuring 2.2 x 1.7 x 1.4 cm  Other findings: No pelvic fluid or separate adnexal masses are seen.  IMPRESSION: Fibroid uterus with fibroid sizes and locations as noted above.  Normal endometrial stripe and ovaries.  Original Report Authenticated By: Bertha Stakes, M.D.   US Pelvis Complete  03/09/2011  *RADIOLOGY REPORT*  Clinical Data: Bleeding for 2 weeks with clots post Mirena removed 02/28/2011  TRANSABDOMINAL AND TRANSVAGINAL ULTRASOUND OF PELVIS Technique:  Both transabdominal and transvaginal ultrasound examinations of the pelvis were performed. Transabdominal technique was performed for global imaging of the pelvis including uterus, ovaries, adnexal regions, and pelvic cul-de-sac.   Comparison: None.   It was necessary to proceed with endovaginal exam following the transabdominal exam to visualize the myometrium, endometrium and adnexa.  Findings:  Uterus: Has a sagittal length of 9.0 cm, AP depth of 4.8 cm and a transverse width of 5.6 cm.  Several focal fibroids are identified with the largest mural, located in the left fundal region measuring 2.6 x 2.6 x 3.6 cm.  Two smaller mural fibroids are identified in the right lateral upper uterine segment measuring 1.8 x 1.7 x 2.1 cm and in the right lateral lower uterine segment measuring 1.9 x 1.8 x 1.7 cm.  Endometrium: Appears thin and echogenic with an AP width of 2.9 mm. No areas of focal thickening or heterogeneity are seen.  Right ovary:  Has a normal appearance measuring 2.3 x 1.3 x 2.0 cm  Left ovary: Has a normal appearance measuring 2.2 x 1.7 x 1.4 cm  Other findings: No pelvic fluid or separate adnexal masses are seen.  IMPRESSION: Fibroid uterus with fibroid sizes and locations as noted above.  Normal endometrial stripe and ovaries.  Original Report Authenticated By: Bertha Stakes, M.D.     MDM  X-rays of lumbar spine not indicated as patient has no red flags for back pain. Discussed with patient and her mother who agree. Back pain felt to be muscular in etiology. Suspect influenza as etiology of cough Plan prescription and Percocet Albuterol inhaler to go to use 2 puffs every 4 hours when necessary cough or shortness of breath Follow up with Dr. Renae Gloss if not improving in 3 or 4 day Diagnosis #1 viral respiratory illness #2 low back pain        Doug Sou, MD 04/06/11 0004  Doug Sou, MD 04/06/11 0009

## 2011-04-05 NOTE — ED Notes (Signed)
Dr. Jacubowitz at bedside 

## 2011-04-05 NOTE — ED Notes (Signed)
Patient back from x-ray; currently sitting up in bed; no respiratory or acute distress noted.  Patient updated on plan of care; informed patient that we are currently waiting on x-ray results.  Patient has no other questions or concerns at this time.  Will continue to monitor.

## 2011-04-05 NOTE — ED Notes (Signed)
Patient currently sitting up in bed; no respiratory or acute distress noted.  Updated patient on plan of care; informed patient that we are currently waiting for x-ray tech to come and transport patient to x-ray.  Patient has no other questions or concerns at this time.  Will continue to monitor.

## 2011-04-06 MED ORDER — OXYCODONE-ACETAMINOPHEN 5-325 MG PO TABS: 2.0000 | ORAL_TABLET | ORAL | Status: AC | PRN

## 2011-04-06 MED ORDER — AEROCHAMBER PLUS W/MASK MISC
Status: AC
  Administered 2011-04-06
  Filled 2011-04-06: qty 1

## 2011-04-06 NOTE — ED Notes (Signed)
Patient currently sitting up in bed; no respiratory or acute distress noted; updated patient on plan of care. Informed patient that we are currently waiting on EDP to write discharge instructions.  Patient has no other questions or concerns at this time.  Will continue to monitor.

## 2011-04-06 NOTE — ED Notes (Signed)
Patient given copy of discharge paperwork; went over discharge instructions with patient.  Instructed patient to take prescriptions as directed, to follow up with primary care physician, and to return to the ED for new, worsening, or concerning symptoms.

## 2011-04-06 NOTE — ED Notes (Signed)
Verbal order from Dr. Ethelda Chick to give patient spacer to go home with.

## 2011-05-21 ENCOUNTER — Ambulatory Visit (INDEPENDENT_AMBULATORY_CARE_PROVIDER_SITE_OTHER): Payer: Medicaid Other | Admitting: Obstetrics & Gynecology

## 2011-05-21 ENCOUNTER — Encounter: Payer: Self-pay | Admitting: Obstetrics & Gynecology

## 2011-05-21 DIAGNOSIS — I82409 Acute embolism and thrombosis of unspecified deep veins of unspecified lower extremity: Secondary | ICD-10-CM

## 2011-05-21 DIAGNOSIS — N920 Excessive and frequent menstruation with regular cycle: Secondary | ICD-10-CM

## 2011-05-21 DIAGNOSIS — N921 Excessive and frequent menstruation with irregular cycle: Secondary | ICD-10-CM | POA: Insufficient documentation

## 2011-05-21 DIAGNOSIS — D259 Leiomyoma of uterus, unspecified: Secondary | ICD-10-CM

## 2011-05-21 MED ORDER — MEDROXYPROGESTERONE ACETATE 10 MG PO TABS: 20.0000 mg | ORAL_TABLET | Freq: Every day | ORAL | Status: DC

## 2011-05-21 MED ORDER — IBUPROFEN 600 MG PO TABS: 600.0000 mg | ORAL_TABLET | Freq: Four times a day (QID) | ORAL | Status: AC | PRN

## 2011-05-21 NOTE — Progress Notes (Signed)
Patient here for follow up visit from 03/20/11 visit for fibroids, has history of blood clot in leg , not on coumadin any longer.   Declines flu shot

## 2011-05-21 NOTE — Progress Notes (Signed)
History:  41 y.o. G2P1011 here today for follow up discussion about fibroids and menometrorrhagia.  History is remarkable for R leg DVT in 01/2011 and she has completed treatment for this.  Patient underwent a hysteroscopy, D&C, resection of submucosal fibroid by Dr. Pennie Rushing in 2007 and was on Mirena for three years.  She reports her bleeding got worse again over time and she had the Mirena IUD removed a few months ago, as "it was not working".  Bleeding has still been heavy and irregular since Mirena IUD removal.  Patient feels weak, occasional lightheadedness.  She is interested in a supracervical hysterectomy after doing a lot of research about her options.  The following portions of the patient's history were reviewed and updated as appropriate: allergies, current medications, past family history, past medical history, past social history, past surgical history and problem list. She has a history of one cesarean section, normal paps (last one in 2011), no STIs.   Objective:  Physical Exam Blood pressure 124/91, pulse 89, temperature 97.6 F (36.4 C), temperature source Oral, height 5\' 5"  (1.651 m), weight 240 lb (108.863 kg), last menstrual period 02/27/2011. Deferred; patient declines endometrial biopsy today, will return to clinic for biopsy and pap smear.  03/09/11 TRANSABDOMINAL AND TRANSVAGINAL ULTRASOUND OF PELVIS Findings: Uterus: Has a sagittal length of 9.0 cm, AP depth of 4.8 cm and a transverse width of 5.6 cm. Several focal fibroids are identified with the largest mural, located in the left fundal region measuring 2.6 x 2.6 x 3.6 cm. Two smaller mural fibroids are identified in the right lateral upper uterine segment measuring 1.8 x 1.7 x 2.1 cm and in the right lateral lower uterine segment measuring 1.9 x 1.8 x 1.7 cm. Endometrium: Appears thin and echogenic with an AP width of 2.9 mm. No areas of focal thickening or heterogeneity are seen. Right ovary: Has a normal appearance measuring  2.3 x 1.3 x 2.0 cm.  Left ovary: Has a normal appearance measuring 2.2 x 1.7 x 1.4 cm Other findings: No pelvic fluid or separate adnexal masses are seen. IMPRESSION: Fibroid uterus with fibroid sizes and locations as noted above. Normal endometrial stripe and ovaries.   Assessment & Plan:  Patient wants a supracervical hysterectomy.  Recommended robotic assisted [facility] or laparoscopic [facility] with bilateral salpingectomy.  Risks reviewed for both modalities.  Patient is interested in robot assisted [facility].  She will be scheduled to meet with Dr. Nicholaus Bloom for further discussion as she is the only one in our practice who is currently credentialed to perform robot assisted procedures.  She will also need a preoperative visit for endometrial biopsy and pap smear, we will call her with this appointment.  General information about hysterectomy given to the patient to review at home.  She was prescribed Provera as needed for heavy bleeding and Ibuprofen for cramping; CBC and TSH also checked today.  Bleeding and pain precautions reviewed.

## 2011-05-21 NOTE — Patient Instructions (Signed)
Supracervical Hysterectomy The uterus is the female organ that produces menstrual periods and carries a baby. The body of the uterus is the top part that is in the pelvis. The cervix (the opening or neck) is the bottom part that protrudes into the top of the vagina. A supracervical hysterectomy is the removal of the body of the uterus but not the cervix. This procedure can be performed with a large abdominal incision. It can also be done with a long tube with a light (laparoscopy) inserted into two small incisions in the lower abdomen.  It can also be done with robotic assistance.  The tubes and ovaries can be removed during this operation if necessary. You will not have menstrual periods or be able to get pregnant after having this procedure.  Women who are going to have this procedure should be tested to make sure they have a normal Pap test and no signs of precancer or cancer disease of the cervix or uterus. You should also know about the possibility of problems developing on the cervix later, which may need more surgery to remove the cervix.  Reasons this procedure is done:  Endometriosis. This is when the lining of the inside of the uterus is misplaced outside of its normal location.   Adenomyosis. This is when endometrial tissue goes in the muscle of the uterus.   Persistent abnormal bleeding.   Lasting (chronic) pelvic pain.   Uterine prolapse. This is when the uterus falls down into the vagina.  LET YOUR CAREGIVER KNOW ABOUT:  Allergies especially to any medications.   All the medications you are taking including over-the-counter medication, herbs, eye drops and creams.   Past problems with anesthesia or novocaine.   Possibility of being pregnant.   All past surgeries.   History of blood clots or bleeding problems.   Other medical problems (diabetes, kidney, heart or lung problems).  RISKS AND COMPLICATIONS  Blood clots of the leg, heart or lung.   Infection.   Severe  bleeding.   Injury to other surrounding organs.   Early menopause.   Problems with the anesthesia.   More surgery later to remove the cervix if you have problems with the cervix.  BEFORE THE PROCEDURE  Do not take aspirin or blood thinners a week before the surgery or as told by your caregiver.   Do not eat or drink anything the night before the surgery or as told by your caregiver.   Let your caregiver know if you develop a cold or any other infection.   If you are admitted the day of the surgery, show up 60 minutes before the surgery or as directed by your caregiver.  PROCEDURE  An intravenous (IV) will be placed in your arm. You will be given an anesthetic to make you sleep during the surgery. You may be given a spinal anesthesia to numb your body from the waist down. When you wake up, you will still have the IV and also have a Foley catheter in you. A Foley catheter will drain your bladder for a day or two.  AFTER THE PROCEDURE  You will be taken to the recovery room for a couple of hours until it is safe to take you to your hospital room. Usually, you will remain in the hospital for 3 to 5 days. You may be given a medicine that kills germs (antibiotic) during and after the surgery. Pain medication will be ordered for you by your caregiver while you are in the  hospital and for when you go home. HOME CARE INSTRUCTIONS  Healing will take time. You will have discomfort, tenderness, swelling and bruising at the operative site for a couple of weeks. This is normal and will get better as time goes on.   Only take over-the-counter or prescription medicines for pain, discomfort or fever as directed by your caregiver.   Do not take aspirin. It can cause bleeding.   Do not drive when taking pain medication.   Follow your caregiver's advice regarding diet, exercise, lifting, driving and general activities.   Resume your usual diet as directed and allowed.   Get plenty of rest and sleep.     Do not douche, use tampons, or have sexual intercourse until your caregiver gives you permission.   Change your bandages (dressings) as directed.   Take your temperature twice a day. Write it down.   Your caregiver may recommend showers instead of baths for a few weeks.   Do not drink alcohol until your caregiver gives you permission.   If you develop constipation, you may take a mild laxative with your caregiver's permission. Bran foods and drinking fluids helps with constipation problems.   Try to have someone home with you for a week or two to help with the household activities.   Make sure you and your family understands everything about your operation and recovery.   Do not sign any legal documents until you feel normal again.   Keep all your follow-up appointments as recommended by your caregiver.  SEEK MEDICAL CARE IF:   There is swelling, redness or increasing pain in the wound area.   Pus is coming from the wound.   You notice a bad smell from the wound or surgical dressing.   You have pain, redness and swelling from the intravenous site.   The wound is breaking open (the edges are not staying together).   You feel dizzy or feel like fainting.   You develop pain or bleeding when you urinate.   You develop diarrhea.   You develop nausea and vomiting.   You develop abnormal vaginal discharge.   You develop a rash.   You have any type of abnormal reaction or develop an allergy to your medication.   You need stronger pain medication for your pain.  SEEK IMMEDIATE MEDICAL CARE IF:  You have an oral temperature above 102 F (38.9 C), not controlled by medicine.   You develop abdominal pain.   You develop chest pain.   You develop shortness of breath.   You pass out.   You develop pain, swelling or redness of your leg.   You develop heavy vaginal bleeding with or without blood clots.  Document Released: 08/26/2007 Document Revised: 07/04/2010  Document Reviewed: 07/26/2007 [facility] Patient Information 2012 [place], [place].

## 2011-05-22 LAB — CBC
HCT: 35.2 % — ABNORMAL LOW (ref 36.0–46.0)
MCH: 29.6 pg (ref 26.0–34.0)
MCHC: 33.2 g/dL (ref 30.0–36.0)
MCV: 89.1 fL (ref 78.0–100.0)
RDW: 13.2 % (ref 11.5–15.5)

## 2011-06-03 ENCOUNTER — Other Ambulatory Visit (HOSPITAL_COMMUNITY)
Admission: RE | Admit: 2011-06-03 | Discharge: 2011-06-03 | Disposition: A | Discharge status: Home / Self Care | Payer: Medicaid Other | Source: Ambulatory Visit | Attending: Obstetrics & Gynecology | Admitting: Obstetrics & Gynecology

## 2011-06-03 ENCOUNTER — Encounter: Payer: Self-pay | Admitting: Obstetrics & Gynecology

## 2011-06-03 ENCOUNTER — Ambulatory Visit (INDEPENDENT_AMBULATORY_CARE_PROVIDER_SITE_OTHER): Payer: Medicaid Other | Admitting: Obstetrics & Gynecology

## 2011-06-03 VITALS — BP 124/82 | HR 92 | Temp 98.1°F | Resp 20 | Ht 65.0 in | Wt 240.6 lb

## 2011-06-03 DIAGNOSIS — N938 Other specified abnormal uterine and vaginal bleeding: Secondary | ICD-10-CM

## 2011-06-03 DIAGNOSIS — Z01419 Encounter for gynecological examination (general) (routine) without abnormal findings: Secondary | ICD-10-CM | POA: Insufficient documentation

## 2011-06-03 DIAGNOSIS — N939 Abnormal uterine and vaginal bleeding, unspecified: Secondary | ICD-10-CM

## 2011-06-03 DIAGNOSIS — N949 Unspecified condition associated with female genital organs and menstrual cycle: Secondary | ICD-10-CM | POA: Insufficient documentation

## 2011-06-03 NOTE — Progress Notes (Signed)
Addended by: Faythe Casa on: 06/03/2011 02:39 PM   Modules accepted: Orders

## 2011-06-03 NOTE — Progress Notes (Signed)
  Subjective:    Patient ID: [name], female    DOB: July 05, 1970, 41 y.o.   MRN: [id number]  HPI  [name] is here for her pap smear and pre op endometrial biopsy.  Review of Systems     Objective:   Physical Exam  Pap done Cervix prepped with betadine, grasped with tenaculem, Pipelle passed twice with a moderate amount of tissue obtained.      Assessment & Plan:  DUB- despite ablation, Mirena. She wants a [facility] and prefers the robotic approach. Risks discussed. We will leave her ovaries in situ.

## 2011-06-08 ENCOUNTER — Encounter (HOSPITAL_COMMUNITY): Payer: Self-pay | Admitting: Pharmacist

## 2011-06-22 ENCOUNTER — Encounter (HOSPITAL_COMMUNITY): Payer: Self-pay

## 2011-06-22 ENCOUNTER — Encounter (HOSPITAL_COMMUNITY)
Admission: RE | Admit: 2011-06-22 | Discharge: 2011-06-22 | Disposition: A | Discharge status: Home / Self Care | Payer: Medicaid Other | Source: Ambulatory Visit | Attending: Obstetrics & Gynecology | Admitting: Obstetrics & Gynecology

## 2011-06-22 HISTORY — DX: Other specified health status: Z78.9

## 2011-06-22 HISTORY — DX: Headache: R51

## 2011-06-22 LAB — CBC
Hemoglobin: 12.6 g/dL (ref 12.0–15.0)
MCH: 29.4 pg (ref 26.0–34.0)
MCV: 89 fL (ref 78.0–100.0)
RBC: 4.28 MIL/uL (ref 3.87–5.11)

## 2011-06-22 NOTE — Patient Instructions (Addendum)
YOUR PROCEDURE IS SCHEDULED ON:06/29/11 ENTER THROUGH THE MAIN ENTRANCE OF [facility] AT:6am  USE DESK PHONE AND DIAL [phone] TO INFORM us OF YOUR ARRIVAL  CALL [phone] IF YOU HAVE ANY QUESTIONS OR PROBLEMS PRIOR TO YOUR ARRIVAL.  REMEMBER: DO NOT EAT OR DRINK AFTER MIDNIGHT :Sunday  SPECIAL INSTRUCTIONS:   YOU MAY BRUSH YOUR TEETH THE MORNING OF SURGERY   TAKE THESE MEDICINES THE DAY OF SURGERY WITH SIP OF WATER:none   DO NOT WEAR JEWELRY, EYE MAKEUP, LIPSTICK OR DARK FINGERNAIL POLISH DO NOT WEAR LOTIONS  DO NOT SHAVE FOR 48 HOURS PRIOR TO SURGERY  YOU WILL NOT BE ALLOWED TO DRIVE YOURSELF HOME.  NAME OF DRIVER: Mother- [phone]

## 2011-06-29 ENCOUNTER — Encounter (HOSPITAL_COMMUNITY): Payer: Self-pay | Admitting: Anesthesiology

## 2011-06-29 ENCOUNTER — Encounter (HOSPITAL_COMMUNITY)
Admission: RE | Disposition: A | Discharge status: Home / Self Care | Payer: Self-pay | Source: Ambulatory Visit | Attending: Obstetrics & Gynecology

## 2011-06-29 ENCOUNTER — Ambulatory Visit (HOSPITAL_COMMUNITY)
Admission: RE | Admit: 2011-06-29 | Discharge: 2011-06-29 | Disposition: A | Discharge status: Home / Self Care | Payer: Medicaid Other | Source: Ambulatory Visit | Attending: Obstetrics & Gynecology | Admitting: Obstetrics & Gynecology

## 2011-06-29 ENCOUNTER — Ambulatory Visit (HOSPITAL_COMMUNITY): Payer: Medicaid Other | Admitting: Anesthesiology

## 2011-06-29 DIAGNOSIS — D259 Leiomyoma of uterus, unspecified: Secondary | ICD-10-CM | POA: Insufficient documentation

## 2011-06-29 DIAGNOSIS — N949 Unspecified condition associated with female genital organs and menstrual cycle: Secondary | ICD-10-CM | POA: Insufficient documentation

## 2011-06-29 DIAGNOSIS — Z01812 Encounter for preprocedural laboratory examination: Secondary | ICD-10-CM | POA: Insufficient documentation

## 2011-06-29 DIAGNOSIS — N938 Other specified abnormal uterine and vaginal bleeding: Secondary | ICD-10-CM | POA: Insufficient documentation

## 2011-06-29 DIAGNOSIS — N920 Excessive and frequent menstruation with regular cycle: Secondary | ICD-10-CM | POA: Insufficient documentation

## 2011-06-29 DIAGNOSIS — Z01818 Encounter for other preprocedural examination: Secondary | ICD-10-CM | POA: Insufficient documentation

## 2011-06-29 HISTORY — PX: ABDOMINAL HYSTERECTOMY: SHX81

## 2011-06-29 SURGERY — ROBOTIC ASSISTED TOTAL HYSTERECTOMY
Anesthesia: General | Site: Abdomen | Wound class: Clean Contaminated

## 2011-06-29 MED ORDER — LACTATED RINGERS IV SOLN
INTRAVENOUS | Status: DC
  Administered 2011-06-29: 10:00:00 via INTRAVENOUS

## 2011-06-29 MED ORDER — SODIUM CHLORIDE 0.9 % IJ SOLN
INTRAMUSCULAR | Status: DC | PRN
  Administered 2011-06-29: 60 mL via INTRAVENOUS

## 2011-06-29 MED ORDER — ROPIVACAINE HCL 5 MG/ML IJ SOLN
INTRAMUSCULAR | Status: DC | PRN
  Administered 2011-06-29: 60 mL via EPIDURAL

## 2011-06-29 MED ORDER — INDIGOTINDISULFONATE SODIUM 8 MG/ML IJ SOLN
INTRAMUSCULAR | Status: AC
  Filled 2011-06-29: qty 5

## 2011-06-29 MED ORDER — GLYCOPYRROLATE 0.2 MG/ML IJ SOLN
INTRAMUSCULAR | Status: DC | PRN
  Administered 2011-06-29: 0.6 mg via INTRAVENOUS

## 2011-06-29 MED ORDER — INDIGOTINDISULFONATE SODIUM 8 MG/ML IJ SOLN
INTRAMUSCULAR | Status: DC | PRN
  Administered 2011-06-29: 5 mL via INTRAVENOUS

## 2011-06-29 MED ORDER — STERILE WATER FOR IRRIGATION IR SOLN
Status: DC | PRN
  Administered 2011-06-29: 1000 mL

## 2011-06-29 MED ORDER — MIDAZOLAM HCL 2 MG/2ML IJ SOLN
INTRAMUSCULAR | Status: AC
  Filled 2011-06-29: qty 2

## 2011-06-29 MED ORDER — FENTANYL CITRATE 0.05 MG/ML IJ SOLN
INTRAMUSCULAR | Status: AC
  Filled 2011-06-29: qty 2

## 2011-06-29 MED ORDER — ROCURONIUM BROMIDE 100 MG/10ML IV SOLN
INTRAVENOUS | Status: DC | PRN
  Administered 2011-06-29: 50 mg via INTRAVENOUS

## 2011-06-29 MED ORDER — IBUPROFEN 800 MG PO TABS
800.0000 mg | ORAL_TABLET | Freq: Three times a day (TID) | ORAL | Status: DC | PRN
  Administered 2011-06-29: 800 mg via ORAL
  Filled 2011-06-29: qty 1

## 2011-06-29 MED ORDER — ACETAMINOPHEN 10 MG/ML IV SOLN
1000.0000 mg | Freq: Once | INTRAVENOUS | Status: AC
  Administered 2011-06-29: 1000 mg via INTRAVENOUS
  Filled 2011-06-29: qty 100

## 2011-06-29 MED ORDER — DEXAMETHASONE SODIUM PHOSPHATE 10 MG/ML IJ SOLN
INTRAMUSCULAR | Status: DC | PRN
  Administered 2011-06-29: 10 mg via INTRAVENOUS

## 2011-06-29 MED ORDER — SIMETHICONE 80 MG PO CHEW: 80.0000 mg | CHEWABLE_TABLET | Freq: Four times a day (QID) | ORAL | Status: DC | PRN

## 2011-06-29 MED ORDER — ROCURONIUM BROMIDE 50 MG/5ML IV SOLN
INTRAVENOUS | Status: AC
  Filled 2011-06-29: qty 1

## 2011-06-29 MED ORDER — LACTATED RINGERS IV SOLN
INTRAVENOUS | Status: DC
  Administered 2011-06-29: 125 mL/h via INTRAVENOUS
  Administered 2011-06-29 (×2): via INTRAVENOUS

## 2011-06-29 MED ORDER — DEXTROSE IN LACTATED RINGERS 5 % IV SOLN: INTRAVENOUS | Status: DC

## 2011-06-29 MED ORDER — CEFAZOLIN SODIUM 1-5 GM-% IV SOLN
INTRAVENOUS | Status: AC
  Filled 2011-06-29: qty 50

## 2011-06-29 MED ORDER — IBUPROFEN 800 MG PO TABS: 800.0000 mg | ORAL_TABLET | Freq: Three times a day (TID) | ORAL | Status: AC | PRN

## 2011-06-29 MED ORDER — LACTATED RINGERS IR SOLN
Status: DC | PRN
  Administered 2011-06-29: 3000 mL

## 2011-06-29 MED ORDER — SCOPOLAMINE 1 MG/3DAYS TD PT72
1.0000 | MEDICATED_PATCH | TRANSDERMAL | Status: DC
  Administered 2011-06-29: 1.5 mg via TRANSDERMAL

## 2011-06-29 MED ORDER — PROPOFOL 10 MG/ML IV EMUL
INTRAVENOUS | Status: AC
  Filled 2011-06-29: qty 40

## 2011-06-29 MED ORDER — ONDANSETRON HCL 4 MG/2ML IJ SOLN
INTRAMUSCULAR | Status: AC
  Filled 2011-06-29: qty 2

## 2011-06-29 MED ORDER — ONDANSETRON HCL 4 MG/2ML IJ SOLN
INTRAMUSCULAR | Status: DC | PRN
  Administered 2011-06-29: 4 mg via INTRAVENOUS

## 2011-06-29 MED ORDER — SUFENTANIL CITRATE 50 MCG/ML IV SOLN
INTRAVENOUS | Status: AC
  Filled 2011-06-29: qty 1

## 2011-06-29 MED ORDER — SUFENTANIL CITRATE 50 MCG/ML IV SOLN
INTRAVENOUS | Status: DC | PRN
  Administered 2011-06-29 (×3): 10 ug via INTRAVENOUS
  Administered 2011-06-29: 20 ug via INTRAVENOUS

## 2011-06-29 MED ORDER — LIDOCAINE HCL (CARDIAC) 20 MG/ML IV SOLN
INTRAVENOUS | Status: DC | PRN
  Administered 2011-06-29: 60 mg via INTRAVENOUS

## 2011-06-29 MED ORDER — MIDAZOLAM HCL 5 MG/5ML IJ SOLN
INTRAMUSCULAR | Status: DC | PRN
  Administered 2011-06-29: 2 mg via INTRAVENOUS

## 2011-06-29 MED ORDER — SCOPOLAMINE 1 MG/3DAYS TD PT72
MEDICATED_PATCH | TRANSDERMAL | Status: AC
  Administered 2011-06-29: 1.5 mg via TRANSDERMAL
  Filled 2011-06-29: qty 1

## 2011-06-29 MED ORDER — KETOROLAC TROMETHAMINE 30 MG/ML IJ SOLN
INTRAMUSCULAR | Status: AC
  Administered 2011-06-29: 30 mg
  Filled 2011-06-29: qty 1

## 2011-06-29 MED ORDER — LIDOCAINE HCL (CARDIAC) 20 MG/ML IV SOLN
INTRAVENOUS | Status: AC
  Filled 2011-06-29: qty 5

## 2011-06-29 MED ORDER — ARTIFICIAL TEARS OP OINT
TOPICAL_OINTMENT | OPHTHALMIC | Status: DC | PRN
  Administered 2011-06-29: 1 via OPHTHALMIC

## 2011-06-29 MED ORDER — DEXAMETHASONE SODIUM PHOSPHATE 10 MG/ML IJ SOLN
INTRAMUSCULAR | Status: AC
  Filled 2011-06-29: qty 1

## 2011-06-29 MED ORDER — ROPIVACAINE HCL 5 MG/ML IJ SOLN
INTRAMUSCULAR | Status: AC
  Filled 2011-06-29: qty 60

## 2011-06-29 MED ORDER — NEOSTIGMINE METHYLSULFATE 1 MG/ML IJ SOLN
INTRAMUSCULAR | Status: AC
  Filled 2011-06-29: qty 10

## 2011-06-29 MED ORDER — GLYCOPYRROLATE 0.2 MG/ML IJ SOLN
INTRAMUSCULAR | Status: AC
  Filled 2011-06-29: qty 2

## 2011-06-29 MED ORDER — CEFAZOLIN SODIUM 1-5 GM-% IV SOLN
1.0000 g | INTRAVENOUS | Status: AC
  Administered 2011-06-29: 1 g via INTRAVENOUS

## 2011-06-29 MED ORDER — OXYCODONE-ACETAMINOPHEN 5-325 MG PO TABS
1.0000 | ORAL_TABLET | ORAL | Status: DC | PRN
  Administered 2011-06-29 (×3): 2 via ORAL
  Filled 2011-06-29 (×3): qty 2

## 2011-06-29 MED ORDER — NEOSTIGMINE METHYLSULFATE 1 MG/ML IJ SOLN
INTRAMUSCULAR | Status: DC | PRN
  Administered 2011-06-29: 3 mg via INTRAVENOUS

## 2011-06-29 MED ORDER — PROPOFOL 10 MG/ML IV EMUL
INTRAVENOUS | Status: DC | PRN
  Administered 2011-06-29: 170 mg via INTRAVENOUS

## 2011-06-29 MED ORDER — OXYCODONE-ACETAMINOPHEN 5-325 MG PO TABS: 1.0000 | ORAL_TABLET | ORAL | Status: AC | PRN

## 2011-06-29 MED ORDER — FENTANYL CITRATE 0.05 MG/ML IJ SOLN
25.0000 ug | INTRAMUSCULAR | Status: DC | PRN
  Administered 2011-06-29 (×3): 50 ug via INTRAVENOUS

## 2011-06-29 SURGICAL SUPPLY — 68 items
BAG URINE DRAINAGE (UROLOGICAL SUPPLIES) ×2 IMPLANT
BARRIER ADHS 3X4 INTERCEED (GAUZE/BANDAGES/DRESSINGS) IMPLANT
BENZOIN TINCTURE PRP APPL 2/3 (GAUZE/BANDAGES/DRESSINGS) ×2 IMPLANT
BLADE LAPAROSCOPIC MORCELL KIT (BLADE) IMPLANT
CABLE HIGH FREQUENCY MONO STRZ (ELECTRODE) ×2 IMPLANT
CATH FOLEY 3WAY  5CC 16FR (CATHETERS) ×1
CATH FOLEY 3WAY 5CC 16FR (CATHETERS) ×1 IMPLANT
CHLORAPREP W/TINT 26ML (MISCELLANEOUS) ×2 IMPLANT
CLOTH BEACON ORANGE TIMEOUT ST (SAFETY) ×2 IMPLANT
CONT PATH 16OZ SNAP LID 3702 (MISCELLANEOUS) ×2 IMPLANT
COVER MAYO STAND STRL (DRAPES) ×2 IMPLANT
COVER TABLE BACK 60X90 (DRAPES) ×4 IMPLANT
COVER TIP SHEARS 8 DVNC (MISCELLANEOUS) ×1 IMPLANT
COVER TIP SHEARS 8MM DA VINCI (MISCELLANEOUS) ×1
DECANTER SPIKE VIAL GLASS SM (MISCELLANEOUS) ×2 IMPLANT
DERMABOND ADVANCED (GAUZE/BANDAGES/DRESSINGS) ×1
DERMABOND ADVANCED .7 DNX12 (GAUZE/BANDAGES/DRESSINGS) ×1 IMPLANT
DEVICE TROCAR PUNCTURE CLOSURE (ENDOMECHANICALS) ×2 IMPLANT
DRAPE HUG U DISPOSABLE (DRAPE) ×2 IMPLANT
DRAPE LG THREE QUARTER DISP (DRAPES) ×4 IMPLANT
DRAPE MONITOR DA VINCI (DRAPE) IMPLANT
DRAPE WARM FLUID 44X44 (DRAPE) ×2 IMPLANT
ELECT REM PT RETURN 9FT ADLT (ELECTROSURGICAL) ×2
ELECTRODE REM PT RTRN 9FT ADLT (ELECTROSURGICAL) ×1 IMPLANT
EVACUATOR SMOKE 8.L (FILTER) ×2 IMPLANT
GAUZE VASELINE 3X9 (GAUZE/BANDAGES/DRESSINGS) IMPLANT
GLOVE BIO SURGEON STRL SZ 6.5 (GLOVE) ×6 IMPLANT
GLOVE ECLIPSE 6.5 STRL STRAW (GLOVE) ×6 IMPLANT
GOWN STRL REIN XL XLG (GOWN DISPOSABLE) ×12 IMPLANT
KIT ACCESSORY DA VINCI DISP (KITS) ×1
KIT ACCESSORY DVNC DISP (KITS) ×1 IMPLANT
KIT DISP ACCESSORY 4 ARM (KITS) IMPLANT
MANIPULATOR UTERINE 4.5 ZUMI (MISCELLANEOUS) IMPLANT
NEEDLE INSUFFLATION 14GA 120MM (NEEDLE) ×2 IMPLANT
NEEDLE INSUFFLATION 14GA 150MM (NEEDLE) ×2 IMPLANT
NEEDLE SPNL 18GX3.5 QUINCKE PK (NEEDLE) IMPLANT
NS IRRIG 1000ML POUR BTL (IV SOLUTION) ×6 IMPLANT
OCCLUDER COLPOPNEUMO (BALLOONS) ×4 IMPLANT
PACK LAVH (CUSTOM PROCEDURE TRAY) ×2 IMPLANT
PAD PREP 24X48 CUFFED NSTRL (MISCELLANEOUS) ×4 IMPLANT
PLUG CATH AND CAP STER (CATHETERS) ×2 IMPLANT
PROTECTOR NERVE ULNAR (MISCELLANEOUS) ×4 IMPLANT
SET CYSTO W/LG BORE CLAMP LF (SET/KITS/TRAYS/PACK) ×2 IMPLANT
SET IRRIG TUBING LAPAROSCOPIC (IRRIGATION / IRRIGATOR) ×2 IMPLANT
SOLUTION ELECTROLUBE (MISCELLANEOUS) ×2 IMPLANT
SPONGE LAP 18X18 X RAY DECT (DISPOSABLE) IMPLANT
STRIP CLOSURE SKIN 1/2X4 (GAUZE/BANDAGES/DRESSINGS) ×2 IMPLANT
SUT VIC AB 0 CT1 27 (SUTURE) ×2
SUT VIC AB 0 CT1 27XBRD ANBCTR (SUTURE) ×2 IMPLANT
SUT VIC AB 0 CT1 27XBRD ANTBC (SUTURE) IMPLANT
SUT VIC AB 2-0 CT2 27 (SUTURE) IMPLANT
SUT VICRYL 0 UR6 27IN ABS (SUTURE) ×4 IMPLANT
SUT VICRYL RAPIDE 4/0 PS 2 (SUTURE) ×4 IMPLANT
SUT VLOC 180 0 9IN  GS21 (SUTURE) ×1
SUT VLOC 180 0 9IN GS21 (SUTURE) ×1 IMPLANT
SYR 50ML LL SCALE MARK (SYRINGE) ×2 IMPLANT
SYSTEM CONVERTIBLE TROCAR (TROCAR) IMPLANT
TIP UTERINE 5.1X6CM LAV DISP (MISCELLANEOUS) IMPLANT
TIP UTERINE 6.7X10CM GRN DISP (MISCELLANEOUS) IMPLANT
TIP UTERINE 6.7X6CM WHT DISP (MISCELLANEOUS) IMPLANT
TIP UTERINE 6.7X8CM BLUE DISP (MISCELLANEOUS) ×2 IMPLANT
TOWEL OR 17X24 6PK STRL BLUE (TOWEL DISPOSABLE) ×4 IMPLANT
TROCAR DISP BLADELESS 8 DVNC (TROCAR) ×1 IMPLANT
TROCAR DISP BLADELESS 8MM (TROCAR) ×1
TROCAR XCEL 12X100 BLDLESS (ENDOMECHANICALS) ×2 IMPLANT
TROCAR Z-THREAD 12X150 (TROCAR) ×2 IMPLANT
TROCAR Z-THREAD BLADED 12X100M (TROCAR) IMPLANT
TUBING FILTER THERMOFLATOR (ELECTROSURGICAL) ×2 IMPLANT

## 2011-06-29 NOTE — Progress Notes (Signed)
Discharge instructions given to and reviewed with patient.  Pt verbalized understanding of discharge instructions.2nd copy of discharge instructions signed and place in patient shadow chart. Pt escorted out by Porsche Cates, Nurse Tech.                      .  

## 2011-06-29 NOTE — Discharge Instructions (Signed)
Hysterectomy Information  A hysterectomy is a procedure where your uterus is surgically removed. It will no longer be possible to have menstrual periods or to become pregnant. The tubes and ovaries can be removed (bilateral salpingo-oopherectomy) during this surgery as well.  REASONS FOR A HYSTERECTOMY  Persistent, abnormal bleeding.   Lasting (chronic) pelvic pain or infection.   The lining of the uterus (endometrium) starts growing outside the uterus (endometriosis).   The endometrium starts growing in the muscle of the uterus (adenomyosis).   The uterus falls down into the vagina (pelvic organ prolapse).   Symptomatic uterine fibroids.   Precancerous cells.   Cervical cancer or uterine cancer.  TYPES OF HYSTERECTOMIES  Supracervical hysterectomy. This type removes the top part of the uterus, but not the cervix.   Total hysterectomy. This type removes the uterus and cervix.   Radical hysterectomy. This type removes the uterus, cervix, and the fibrous tissue that holds the uterus in place in the pelvis (parametrium).  WAYS A HYSTERECTOMY CAN BE PERFORMED  Abdominal hysterectomy. A large surgical cut (incision) is made in the abdomen. The uterus is removed through this incision.   Vaginal hysterectomy. An incision is made in the vagina. The uterus is removed through this incision. There are no abdominal incisions.   Conventional laparoscopic hysterectomy. A thin, lighted tube with a camera (laparoscope) is inserted into 3 or 4 small incisions in the abdomen. The uterus is cut into small pieces. The small pieces are removed through the incisions, or they are removed through the vagina.   Laparoscopic assisted vaginal hysterectomy (LAVH). Three or four small incisions are made in the abdomen. Part of the surgery is performed laparoscopically and part vaginally. The uterus is removed through the vagina.   Robot-assisted laparoscopic hysterectomy. A laparoscope is inserted into 3 or 4  small incisions in the abdomen. A computer-controlled device is used to give the surgeon a 3D image. This allows for more precise movements of surgical instruments. The uterus is cut into small pieces and removed through the incisions or removed through the vagina.  RISKS OF HYSTERECTOMY   Bleeding and risk of blood transfusion. Tell your caregiver if you do not want to receive any blood products.   Blood clots in the legs or lung.   Infection.   Injury to surrounding organs.   Anesthesia problems or side effects.   Conversion to an abdominal hysterectomy.  WHAT TO EXPECT AFTER A HYSTERECTOMY  You will be given pain medicine.   You will need to have someone with you for the first 3 to 5 days after you go home.   You will need to follow up with your surgeon in 2 to 4 weeks after surgery to evaluate your progress.   You may have early menopause symptoms like hot flashes, night sweats, and insomnia.   If you had a hysterectomy for a problem that was not a cancer or a condition that could lead to cancer, then you no longer need Pap tests. However, even if you no longer need a Pap test, a regular exam is a good idea to make sure no other problems are starting.  Document Released: 09/02/2000 Document Revised: 02/26/2011 Document Reviewed: 10/18/2010 [employer] Patient Information 2012 [employer]. 

## 2011-06-29 NOTE — Anesthesia Postprocedure Evaluation (Signed)
Anesthesia Post Note  Patient: [name]  Procedure(s) Performed: Procedure(s) (LRB): ROBOTIC ASSISTED TOTAL HYSTERECTOMY (N/A)  Anesthesia type: GA  Patient location: PACU  Post pain: Pain level controlled  Post assessment: Post-op Vital signs reviewed  Last Vitals:  Filed Vitals:   06/29/11 0930  BP: 122/50  Pulse: 94  Temp:   Resp: 17    Post vital signs: Reviewed  Level of consciousness: sedated  Complications: No apparent anesthesia complications

## 2011-06-29 NOTE — H&P (Addendum)
China A Ewart is an 41 y.o. female. She is single, G2P1A1 who is here today for a RATH. 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 She has tried a [employer] fast streamer for 3 years without success. She is currently on provera.  Pertinent Gynecological History: Menses: flow is excessive with use of 10 pads or tampons on heaviest days Bleeding: dysfunctional uterine bleeding Contraception: abstinence DES exposure: denies Blood transfusions: none Sexually transmitted diseases: no past history Previous GYN Procedures: endometrial ablation  Last mammogram: normal Date: 2011 Last pap: normal Date: 2013 OB History: G2, P1A1   Menstrual History: Menarche age: 22 No LMP recorded.    Past Medical History  Diagnosis Date  . DVT (deep venous thrombosis)     ? vs lupus  . Fibroids   . Anemia   . Headache   . Difficult intravenous access     hands are usually best for blood draws and IV starts    Past Surgical History  Procedure Date  . Neck surgery   . Cervical spine surgery   . Myomectomy   . Wisdom tooth extraction   . Cesarean section 1996    Family History  Problem Relation Age of Onset  . Anesthesia problems Neg Hx   . Hypertension Maternal Grandmother     Social History:  reports that she has never smoked. She has never used smokeless tobacco. She reports that she does not drink alcohol or use illicit drugs.  Allergies:  Allergies  Allergen Reactions  . Iodine Hives  . Shrimp (Shellfish Allergy) Hives    Prescriptions prior to admission  Medication Sig Dispense Refill  . cholecalciferol (VITAMIN D) 1000 UNITS tablet Take 2,000 Units by mouth every morning.       . Ferrous Sulfate (SLOW RELEASE IRON PO) Take 1 tablet by mouth every morning.      Marland Kitchen ibuprofen (ADVIL,MOTRIN) 600 MG tablet Take 600 mg by mouth every 8 (eight) hours as needed. For pain      . medroxyPROGESTERone (PROVERA) 10 MG tablet Take 2 tablets (20 mg total) by mouth daily.  60  tablet  2    ROS Abstinent for 2 [employer] at [facility] [employer] Blood pressure 116/81, pulse 74, temperature 98.8 F (37.1 C), temperature source Oral, resp. rate 16, height 5\' 5"  (1.651 m), weight 108.863 kg (240 lb), SpO2 100.00%. Physical Exam Heart-rrr Lungs-CTAB abd-obese, benign  U/S 2 small fibroids on u/s No results found for this or any previous visit (from the past 24 hour(s)).  No results found.  Assessment/Plan: DUB despite multiple treatments. Fibroids on U/S. We will proceed with robotic assisted total hysterectomy. Initially, I had planned on doing a RASH, but she is not willing to accept the small risk that she may have some cyclic spotting with a cervix in place. She understands the risks of surgery including infection, bleeding, damage to bowel, bladder, ureters.  Avryl Roehm C. 06/29/2011, 6:43 AM

## 2011-06-29 NOTE — Transfer of Care (Signed)
Immediate Anesthesia Transfer of Care Note  Patient: [name]  Procedure(s) Performed: Procedure(s) (LRB): ROBOTIC ASSISTED TOTAL HYSTERECTOMY (N/A)  Patient Location: PACU  Anesthesia Type: General  Level of Consciousness: awake  Airway & Oxygen Therapy: Patient Spontanous Breathing and Patient connected to nasal cannula oxygen  Post-op Assessment: Report given to PACU RN and Post -op Vital signs reviewed and stable  Post vital signs: stable  Complications: No apparent anesthesia complications

## 2011-06-29 NOTE — Op Note (Signed)
06/29/2011  9:12 AM  PATIENT:  [name]  41 y.o. female  PRE-OPERATIVE DIAGNOSIS:  Fibroids and menometrorrhagia  POST-OPERATIVE DIAGNOSIS:  fibroids, menometrorrhagia  PROCEDURE:  Procedure(s) (LRB): ROBOTIC ASSISTED TOTAL HYSTERECTOMY (N/A)  SURGEON:  Surgeon(s) and Role:    * Allie Bossier, MD - Primary      PHYSICIAN ASSISTANT:   ASSISTANTS: Jaynie Collins, MD   ANESTHESIA:   local and general  EBL:  Total I/O In: 1000 [I.V.:1000] Out: 175 [Urine:100; Blood:75]  BLOOD ADMINISTERED:none  DRAINS: none   LOCAL MEDICATIONS USED:  OTHER  ropivicaine  SPECIMEN:  Source of Specimen:  uterus, oviducts  DISPOSITION OF SPECIMEN:  PATHOLOGY  COUNTS:  YES  TOURNIQUET:  * No tourniquets in log *  DICTATION: .Dragon Dictation  PLAN OF CARE: outpatient with extended recovery  PATIENT DISPOSITION:  PACU - hemodynamically stable.   Delay start of Pharmacological VTE agent (>24hrs) due to surgical blood loss or risk of bleeding: not applicable  The risks, benefits, and alternatives of surgery were explained, understood, and accepted. Consents were signed. All questions were answered. She was taken to the operating room and general anesthesia was applied without complication. She was placed in the dorsal lithotomy position and her abdomen and vagina were prepped and draped after she had been carefully positioned on the table. A bimanual exam revealed a NSSA uterus that was mobile. Her adnexa were not enlarged. The cervix was measured and the uterus was sounded to 9 cm. A Rumi uterine manipulator was placed without difficulty. A Foley catheter was placed and it drained clear throughout the case. Gloves were changed and attention was turned to the abdomen. A vertical umbilical incision was made and a Veress needle was placed intraperitoneally. CO2 was used to insufflate the abdomen to approximately 6 L. After good pneumoperitoneum was established, a 12 mm trocar was placed.  Laparoscopy confirmed correct placement. She was placed in Trendelenburg position and ports were placed in appropriate positions on her abdomen to allow maximum exposure during the robotic case. Specifically there was an 12 mm assistant port placed in the right lower quadrant under direct laproscopic visualization. A 8 mm port was placed about 10 cm lateral to the umbilical port on each side under direct laparoscopic visualization. Prior to all incisions, each site was injected with ropivicaine. The robot was attached to the ports and I proceeded with a robotic portion of the case. The pelvis was inspected and an and an alpha manner. There were some adhesions of the left ovary to the posterior side of the uterus. These were taken down with electrosurgical method. The remainder of her pelvis appeared normal as did the upper abdomen. The ureters and the infundibulopelvic ligaments were identified. The infundibulopelvic ligament on each side was cauterized and cut. The PK/gyrus instrument was used for this portion the round ligaments were identified, cauterized and ligated.a bladder flap was created anteriorly. The uterine vessels were identified and cauterized and then cut.The bladder was pushed out of the operative site and an anterior colpotomy was made. The colpotomy incision was extended circumferentially, following the blue outline of the Rumi manipulator. All pedicles were hemostatic. The uterus was removed through the vagina. I then excised the oviducts on each side using the PK device.The vaginal cuff was closed with a 0 vicryl V lock.  Excellent hemostasis was noted throughout. The pelvis was irrigated. The robot was undocked. At this point I performed cystoscopy. The cystoscopy revealed blue ejection from both ureters. Through the  laparoscope I was able to see the ureters functioning normally and appearing of normal caliber. At this point we used the laparoscopic fascial closure device on the 12mm assistant  port site. The CO2 was allowed to escape from the abdomen and the remainder of the ports were removed. The fascia at the umbilicus was elevated and closed in a figure-of-eight fashion with a 0 Vicryl suture. A subcuticular closure was done at all incision sites with 4-0 Vicryl suture. She was extubated and taken to recovery in stable condition.

## 2011-06-29 NOTE — Anesthesia Preprocedure Evaluation (Addendum)
Anesthesia Evaluation  Patient identified by MRN, date of birth, ID band Patient awake    Reviewed: Allergy & Precautions, H&P , NPO status , Patient's Chart, lab work & pertinent test results  Airway Mallampati: III TM Distance: >3 FB Neck ROM: Full    Dental No notable dental hx. (+) Teeth Intact   Pulmonary neg pulmonary ROS,    Pulmonary exam normal       Cardiovascular negative cardio ROS  Rhythm:Regular Rate:Normal  Hx/o DVT 11/12 took Coumadin, but then told she didn't have a DVT ????   Neuro/Psych  Headaches, negative psych ROS   GI/Hepatic negative GI ROS, Neg liver ROS,   Endo/Other  Morbid obesity  Renal/GU negative Renal ROS  negative genitourinary   Musculoskeletal negative musculoskeletal ROS (+)   Abdominal (+) + obese,  Abdomen: soft.    Peds  Hematology negative hematology ROS (+)   Anesthesia Other Findings   Reproductive/Obstetrics negative OB ROS                          Anesthesia Physical Anesthesia Plan  ASA: III  Anesthesia Plan: General   Post-op Pain Management:    Induction: Intravenous  Airway Management Planned: Oral ETT  Additional Equipment:   Intra-op Plan:   Post-operative Plan: Extubation in OR  Informed Consent: I have reviewed the patients History and Physical, chart, labs and discussed the procedure including the risks, benefits and alternatives for the proposed anesthesia with the patient or authorized representative who has indicated his/her understanding and acceptance.   Dental advisory given  Plan Discussed with: CRNA, Anesthesiologist and Surgeon  Anesthesia Plan Comments:         Anesthesia Quick Evaluation

## 2011-08-06 ENCOUNTER — Encounter: Payer: Self-pay | Admitting: Obstetrics & Gynecology

## 2011-08-06 ENCOUNTER — Ambulatory Visit (INDEPENDENT_AMBULATORY_CARE_PROVIDER_SITE_OTHER): Payer: Medicaid Other | Admitting: Obstetrics & Gynecology

## 2011-08-06 DIAGNOSIS — Z09 Encounter for follow-up examination after completed treatment for conditions other than malignant neoplasm: Secondary | ICD-10-CM

## 2011-08-06 MED ORDER — METRONIDAZOLE 500 MG PO TABS: 500.0000 mg | ORAL_TABLET | Freq: Three times a day (TID) | ORAL | Status: AC

## 2011-08-06 NOTE — Progress Notes (Signed)
  Subjective:    Patient ID: [name], female    DOB: 06-18-1970, 41 y.o.   MRN: [id number]  HPI  She is now 6 weeks status post RATH/bilateral salpinjectomy/cystoscopy. She went back to work 4 days post op. She has not had sex yet. She does complain of a smelly vaginal discharge    Review of Systems     Objective:   Physical Exam Well- healed abdominal scars Normal bimanual Well-healed vaginal cuff Discharge consistent with BV      Assessment & Plan:  Post op doing well BV-flagyl

## 2011-08-07 LAB — WET PREP, GENITAL: Trich, Wet Prep: NONE SEEN

## 2011-08-28 ENCOUNTER — Emergency Department (HOSPITAL_COMMUNITY): Payer: Medicaid Other

## 2011-08-28 ENCOUNTER — Inpatient Hospital Stay (HOSPITAL_COMMUNITY)
Admission: EM | Admit: 2011-08-28 | Discharge: 2011-09-03 | DRG: 490 | Disposition: A | Discharge status: Rehabilitation Facility | Payer: Medicaid Other | Attending: Internal Medicine | Admitting: Internal Medicine

## 2011-08-28 ENCOUNTER — Encounter (HOSPITAL_COMMUNITY): Payer: Self-pay | Admitting: *Deleted

## 2011-08-28 DIAGNOSIS — M48061 Spinal stenosis, lumbar region without neurogenic claudication: Secondary | ICD-10-CM

## 2011-08-28 DIAGNOSIS — R209 Unspecified disturbances of skin sensation: Secondary | ICD-10-CM | POA: Diagnosis present

## 2011-08-28 DIAGNOSIS — R109 Unspecified abdominal pain: Secondary | ICD-10-CM

## 2011-08-28 DIAGNOSIS — R1084 Generalized abdominal pain: Secondary | ICD-10-CM

## 2011-08-28 DIAGNOSIS — I82409 Acute embolism and thrombosis of unspecified deep veins of unspecified lower extremity: Secondary | ICD-10-CM

## 2011-08-28 DIAGNOSIS — R32 Unspecified urinary incontinence: Secondary | ICD-10-CM | POA: Diagnosis present

## 2011-08-28 DIAGNOSIS — B952 Enterococcus as the cause of diseases classified elsewhere: Secondary | ICD-10-CM | POA: Diagnosis present

## 2011-08-28 DIAGNOSIS — Z90710 Acquired absence of both cervix and uterus: Secondary | ICD-10-CM

## 2011-08-28 DIAGNOSIS — N921 Excessive and frequent menstruation with irregular cycle: Secondary | ICD-10-CM

## 2011-08-28 DIAGNOSIS — M5126 Other intervertebral disc displacement, lumbar region: Principal | ICD-10-CM

## 2011-08-28 DIAGNOSIS — R0609 Other forms of dyspnea: Secondary | ICD-10-CM | POA: Diagnosis not present

## 2011-08-28 DIAGNOSIS — Z79899 Other long term (current) drug therapy: Secondary | ICD-10-CM

## 2011-08-28 DIAGNOSIS — Z86718 Personal history of other venous thrombosis and embolism: Secondary | ICD-10-CM

## 2011-08-28 DIAGNOSIS — Z6841 Body Mass Index (BMI) 40.0 and over, adult: Secondary | ICD-10-CM

## 2011-08-28 DIAGNOSIS — R0989 Other specified symptoms and signs involving the circulatory and respiratory systems: Secondary | ICD-10-CM | POA: Diagnosis not present

## 2011-08-28 DIAGNOSIS — D259 Leiomyoma of uterus, unspecified: Secondary | ICD-10-CM

## 2011-08-28 DIAGNOSIS — R112 Nausea with vomiting, unspecified: Secondary | ICD-10-CM

## 2011-08-28 DIAGNOSIS — E860 Dehydration: Secondary | ICD-10-CM

## 2011-08-28 DIAGNOSIS — N390 Urinary tract infection, site not specified: Secondary | ICD-10-CM

## 2011-08-28 DIAGNOSIS — E876 Hypokalemia: Secondary | ICD-10-CM

## 2011-08-28 DIAGNOSIS — R079 Chest pain, unspecified: Secondary | ICD-10-CM | POA: Diagnosis not present

## 2011-08-28 HISTORY — DX: Adverse effect of unspecified anesthetic, initial encounter: T41.45XA

## 2011-08-28 HISTORY — DX: Scoliosis, unspecified: M41.9

## 2011-08-28 HISTORY — DX: Other complications of anesthesia, initial encounter: T88.59XA

## 2011-08-28 LAB — URINALYSIS, ROUTINE W REFLEX MICROSCOPIC
Bilirubin Urine: NEGATIVE
Hgb urine dipstick: NEGATIVE
Specific Gravity, Urine: 1.027 (ref 1.005–1.030)
Urobilinogen, UA: 1 mg/dL (ref 0.0–1.0)

## 2011-08-28 LAB — COMPREHENSIVE METABOLIC PANEL
ALT: 13 U/L (ref 0–35)
Alkaline Phosphatase: 82 U/L (ref 39–117)
CO2: 22 mEq/L (ref 19–32)
GFR calc Af Amer: >90 mL/min (ref >90)
GFR calc non Af Amer: >90 mL/min (ref >90)
Glucose, Bld: 110 mg/dL — ABNORMAL HIGH (ref 70–99)
Potassium: 3.4 mEq/L — ABNORMAL LOW (ref 3.5–5.1)
Sodium: 135 mEq/L (ref 135–145)
Total Protein: 8.9 g/dL — ABNORMAL HIGH (ref 6.0–8.3)

## 2011-08-28 LAB — DIFFERENTIAL
Lymphocytes Relative: 6 % — ABNORMAL LOW (ref 12–46)
Lymphs Abs: 0.8 10*3/uL (ref 0.7–4.0)
Neutrophils Relative %: 88 % — ABNORMAL HIGH (ref 43–77)

## 2011-08-28 LAB — CBC
Platelets: 232 10*3/uL (ref 150–400)
RBC: 4.65 MIL/uL (ref 3.87–5.11)
WBC: 13.3 10*3/uL — ABNORMAL HIGH (ref 4.0–10.5)

## 2011-08-28 MED ORDER — HYOSCYAMINE SULFATE 0.5 MG/ML IJ SOLN
0.1250 mg | Freq: Once | INTRAMUSCULAR | Status: AC
  Administered 2011-08-28: 0.125 mg via INTRAVENOUS
  Filled 2011-08-28: qty 0.25

## 2011-08-28 MED ORDER — ONDANSETRON HCL 4 MG/2ML IJ SOLN
4.0000 mg | Freq: Four times a day (QID) | INTRAMUSCULAR | Status: DC | PRN
  Administered 2011-08-28: 4 mg via INTRAVENOUS
  Filled 2011-08-28: qty 2

## 2011-08-28 MED ORDER — ALUM & MAG HYDROXIDE-SIMETH 200-200-20 MG/5ML PO SUSP: 30.0000 mL | Freq: Four times a day (QID) | ORAL | Status: DC | PRN

## 2011-08-28 MED ORDER — HYDROMORPHONE HCL PF 1 MG/ML IJ SOLN
1.0000 mg | INTRAMUSCULAR | Status: DC | PRN
  Administered 2011-08-28: 1 mg via INTRAVENOUS
  Filled 2011-08-28 (×2): qty 1

## 2011-08-28 MED ORDER — ONDANSETRON HCL 4 MG/2ML IJ SOLN
4.0000 mg | Freq: Four times a day (QID) | INTRAMUSCULAR | Status: DC | PRN
  Administered 2011-08-29 – 2011-08-30 (×2): 4 mg via INTRAVENOUS
  Filled 2011-08-28 (×2): qty 2

## 2011-08-28 MED ORDER — ONDANSETRON HCL 4 MG/2ML IJ SOLN
4.0000 mg | Freq: Once | INTRAMUSCULAR | Status: AC
  Administered 2011-08-28: 4 mg via INTRAVENOUS
  Filled 2011-08-28: qty 2

## 2011-08-28 MED ORDER — HYDROMORPHONE HCL PF 1 MG/ML IJ SOLN: 0.5000 mg | INTRAMUSCULAR | Status: DC | PRN

## 2011-08-28 MED ORDER — HYDROMORPHONE HCL PF 1 MG/ML IJ SOLN
0.5000 mg | INTRAMUSCULAR | Status: DC | PRN
  Administered 2011-08-28: 0.5 mg via INTRAVENOUS
  Filled 2011-08-28 (×2): qty 1

## 2011-08-28 MED ORDER — ONDANSETRON HCL 4 MG PO TABS: 4.0000 mg | ORAL_TABLET | Freq: Four times a day (QID) | ORAL | Status: DC

## 2011-08-28 MED ORDER — SODIUM CHLORIDE 0.9 % IV SOLN
Freq: Once | INTRAVENOUS | Status: AC
  Administered 2011-08-28 (×2): via INTRAVENOUS

## 2011-08-28 MED ORDER — POTASSIUM CHLORIDE IN NACL 20-0.9 MEQ/L-% IV SOLN
INTRAVENOUS | Status: AC
  Administered 2011-08-29: 02:00:00 via INTRAVENOUS
  Filled 2011-08-28: qty 1000

## 2011-08-28 MED ORDER — ONDANSETRON HCL 4 MG PO TABS: 4.0000 mg | ORAL_TABLET | Freq: Four times a day (QID) | ORAL | Status: DC | PRN

## 2011-08-28 MED ORDER — LORAZEPAM 2 MG/ML IJ SOLN
1.0000 mg | Freq: Once | INTRAMUSCULAR | Status: AC
  Administered 2011-08-28: 1 mg via INTRAVENOUS
  Filled 2011-08-28: qty 1

## 2011-08-28 MED ORDER — MORPHINE SULFATE 4 MG/ML IJ SOLN
4.0000 mg | Freq: Once | INTRAMUSCULAR | Status: AC
  Administered 2011-08-28: 4 mg via INTRAVENOUS
  Filled 2011-08-28: qty 1

## 2011-08-28 MED ORDER — HYDROMORPHONE HCL PF 1 MG/ML IJ SOLN
1.0000 mg | Freq: Once | INTRAMUSCULAR | Status: AC
  Administered 2011-08-28: 1 mg via INTRAVENOUS
  Filled 2011-08-28: qty 1

## 2011-08-28 MED ORDER — OXYCODONE-ACETAMINOPHEN 5-325 MG PO TABS: 1.0000 | ORAL_TABLET | ORAL | Status: DC | PRN

## 2011-08-28 NOTE — ED Notes (Signed)
Pt family states pt is still in pain. When rn entered the room pt was falling asleep. Pt was falling asleep while taking to rn and edpa. 0.5mg  of dilaudid was not given. Pt family states pt can not stand and walk. States pt is unable to go home due to pain

## 2011-08-28 NOTE — Discharge Instructions (Signed)
YOU CAN BE DISCHARGED HOME TO FOLLOW UP WITH DR. SHELTON FOR RECHECK OF ABDOMINAL PAIN ON MONDAY. CALL DR. DOVE TO FOLLOW UP WITH CT FINDING OF FLUID COLLECTION IN RIGHT PELVIS, NOT THOUGHT TO BE CONTRIBUTORY TO YOUR CURRENT ABDOMINAL DISCOMFORT. TAKE MEDICATIONS AS PRESCRIBED. RETURN HERE WITH ANY HIGH FEVER, UNCONTROLLED VOMITING OR NEW CONCERN.  Abdominal Pain Abdominal pain can be caused by many things. Your caregiver decides the seriousness of your pain by an examination and possibly blood tests and X-rays. Many cases can be observed and treated at home. Most abdominal pain is not caused by a disease and will probably improve without treatment. However, in many cases, more time must pass before a clear cause of the pain can be found. Before that point, it may not be known if you need more testing, or if hospitalization or surgery is needed. HOME CARE INSTRUCTIONS   Do not take laxatives unless directed by your caregiver.   Take pain medicine only as directed by your caregiver.   Only take over-the-counter or prescription medicines for pain, discomfort, or fever as directed by your caregiver.   Try a clear liquid diet (broth, tea, or water) for as long as directed by your caregiver. Slowly move to a bland diet as tolerated.  SEEK IMMEDIATE MEDICAL CARE IF:   The pain does not go away.   You have a fever.   You keep throwing up (vomiting).   The pain is felt only in portions of the abdomen. Pain in the right side could possibly be appendicitis. In an adult, pain in the left lower portion of the abdomen could be colitis or diverticulitis.   You pass bloody or black tarry stools.  MAKE SURE YOU:   Understand these instructions.   Will watch your condition.   Will get help right away if you are not doing well or get worse.  Document Released: 12/17/2004 Document Revised: 02/26/2011 Document Reviewed: 10/26/2007 [facility] Patient Information 2012 [place], [place].

## 2011-08-28 NOTE — ED Notes (Signed)
WUJ:[id number]<JY> Expected date:<BR> Expected time: 9:33 AM<BR> Means of arrival:Ambulance<BR> Comments:<BR> 41yF abd pain, nausea

## 2011-08-28 NOTE — ED Notes (Signed)
Per ems: pt is from home. Around 5am pt was in the bed and started to have sharp abdominal pain. Constant in the center of her lower abdomin. Nausea not emesis. Normal bm. No blood in stool. Urine darker than normal.

## 2011-08-28 NOTE — ED Notes (Signed)
Pt states she is unable to void at this time.

## 2011-08-28 NOTE — ED Notes (Signed)
Pt  Will Tx  To room 1319 report given to [facility]

## 2011-08-28 NOTE — ED Notes (Signed)
Attempted iv stick, unsuccessful iv notified

## 2011-08-28 NOTE — H&P (Signed)
PCP:   Alva Garnet., MD, MD   Chief Complaint:  Abdominal pain and generalized per day with nausea and vomiting  HPI: 41 year old female who is a vague historian and sedated right now from pain medications who presents emergency department because of approximately 24 hours and generalized abdominal pain. Initially she said this is new however looking at her records it seems as if she's got chronic abdominal pain which she recently had a total hysterectomy in April of 2013. She says that this pain is much different than the pain she is to have associated with her periods. This pain is generalized and constant with associated nausea and vomiting. She denies any diarrhea. Denies any fevers. She denies any pelvic pain. She did have a postoperative vaginal infection for which she was treated for BV by her gynecologist in May of 2013. She says her vaginal discharge has since resolved. She denies any localization of the abdominal pain in any specific quadrant. She feels like she's been very bloated for the last several days. He denies any blood in her vomit or any bowel movements. She denies any sick contacts. She currently says the pain is still 10 out of 10 however history patient dozes off frequently.  Review of Systems:  Otherwise negative  Past Medical History: Past Medical History  Diagnosis Date  . DVT (deep venous thrombosis)     ? vs lupus  . Fibroids   . Anemia   . Headache   . Difficult intravenous access     hands are usually best for blood draws and IV starts  . Complication of anesthesia     slow to wake up  . Scoliosis    Past Surgical History  Procedure Date  . Neck surgery   . Cervical spine surgery   . Myomectomy   . Wisdom tooth extraction   . Cesarean section 1996  . Abdominal hysterectomy 06/29/11    Robotic Assisted Hysterectomy    Medications: Prior to Admission medications   Medication Sig Start Date End Date Taking? Authorizing Provider    cholecalciferol (VITAMIN D) 1000 UNITS tablet Take 2,000 Units by mouth every morning.    Yes Historical Provider, MD  ondansetron (ZOFRAN) 4 MG tablet Take 1 tablet (4 mg total) by mouth every 6 (six) hours. 08/28/11 09/04/11  Rodena Medin, PA-C  oxyCODONE-acetaminophen (PERCOCET) 5-325 MG per tablet Take 1 tablet by mouth every 4 (four) hours as needed for pain. 08/28/11 09/07/11  Rodena Medin, PA-C    Allergies:   Allergies  Allergen Reactions  . Iodine Hives  . Shrimp (Shellfish Allergy) Hives    Social History:  reports that she has never smoked. She has never used smokeless tobacco. She reports that she does not drink alcohol or use illicit drugs.  Family History: Family History  Problem Relation Age of Onset  . Anesthesia problems Neg Hx   . Hypertension Maternal Grandmother     Physical Exam: Filed Vitals:   08/28/11 0947 08/28/11 1336 08/28/11 1815  BP: 128/86 128/76 99/71  Pulse: 95  108  Temp: 97.7 F (36.5 C)    TempSrc: Oral    Resp: 24 18   SpO2: 100% 99%    General appearance: cooperative and no distress Lungs: clear to auscultation bilaterally Heart: regular rate and rhythm, S1, S2 normal, no murmur, click, rub or gallop Abdomen: diffuse ttp, nd pos bs no r/g Extremities: extremities normal, atraumatic, no cyanosis or edema Pulses: 2+ and symmetric Skin: Skin color, texture,  turgor normal. No rashes or lesions Neurologic: Grossly normal    Labs on Admission:   [facility] 08/28/11 1055  NA 135  K 3.4*  CL 100  CO2 22  GLUCOSE 110*  BUN 11  CREATININE 0.70  CALCIUM 9.4  MG --  PHOS --    Basename 08/28/11 1055  AST 17  ALT 13  ALKPHOS 82  BILITOT 0.4  PROT 8.9*  ALBUMIN 4.1    Basename 08/28/11 1055  LIPASE 16  AMYLASE --    Basename 08/28/11 1055  WBC 13.3*  NEUTROABS 11.7*  HGB 13.7  HCT 40.4  MCV 86.9  PLT 232    Radiological Exams on Admission: Ct Abdomen Pelvis Wo Contrast  08/28/2011  **ADDENDUM** CREATED:  08/28/2011 17:06:10  Original report read by Dr. Constance Goltz.  Addendum by Dr. Purcell Mouton. Review of the study was requested by the emergency department at this patient who reportedly underwent total hysterectomy on 06/29/2011. On review of the pathology from that surgical procedure, there is no mention of ovarian tissue in the specimen. Therefore, the right adnexal finding could be related to an ovarian remnant. Alternately, this could reflect a postsurgical finding.  As stated, pelvic ultrasound may be helpful for further evaluation as clinically warranted.  **END ADDENDUM** SIGNED BY: Gerrianne Scale, M.D.   08/28/2011  *RADIOLOGY REPORT*  Clinical Data: Central lower abdominal pain.  CT ABDOMEN AND PELVIS WITHOUT CONTRAST  Technique:  Multidetector CT imaging of the abdomen and pelvis was performed following the standard protocol without intravenous contrast.  Comparison: None.  Findings: Within the right aspect of the pelvis, there is a complex slightly dense 3.4 x 3.6 x 3.3 cm lesion which may represent a complex ovarian process possibly a hemorrhagic cyst.  Sonogram would prove helpful for further delineation.  There is a minimal amount of adjacent fluid which extends along the sigmoid colon. This may be related to the ovarian process rather than primary sigmoid colitis.  The cecum is displaced medially and superiorly.  No inflammation surrounds the appendix.  Scattered top normal size loops of small bowel without obstruction noted.  Evaluation of solid abdominal viscera is limited by lack of IV contrast.  Taking this limitation into account no focal hepatic, splenic, pancreatic, renal or adrenal lesion.  No calcified gallstones.  No free intraperitoneal air.  Minimal atelectatic changes lung bases.  No abdominal aortic aneurysm.  Small hiatal hernia.  Degenerative changes lower lumbar spine.  IMPRESSION: Within the right aspect of the pelvis, there is a complex slightly dense 3.4 x 3.6 x 3.3 cm lesion which may  represent a complex ovarian process possibly a hemorrhagic cyst.  Sonogram would prove helpful for further delineation.  There is a minimal amount of adjacent fluid which extends along the sigmoid colon.  This may be related to the ovarian process rather than primary sigmoid colitis.  The cecum is displaced medially and superiorly.  No inflammation surrounds the appendix.  Scattered top normal size loops of small bowel without obstruction noted.  Original Report Authenticated By: Gerrianne Scale, M.D.   Dg Abd Acute W/chest  08/28/2011  *RADIOLOGY REPORT*  Clinical Data: Abdominal pain, shortness of breath  ACUTE ABDOMEN SERIES (ABDOMEN 2 VIEW & CHEST 1 VIEW)  Comparison: Chest x-ray of 04/05/2011 and lumbar spine film of 07/09/2011  Findings: The lungs are poorly aerated and there is compression of markings at the lung bases.  No definite active process is seen. Heart size is normal.  A lower anterior cervical  spine fusion plate is present.  Supine and left lateral decubitus films of the abdomen show both large and small bowel gas to be present.  No definite bowel obstruction is seen.  No free intraperitoneal air is noted.  No opaque calculi are seen.  IMPRESSION:  1.  Poor inspiration.  No active process. 2.  No evidence of bowel obstruction.  Both large and small bowel gas is present.  No free air.  Original Report Authenticated By: Juline Patch, M.D.    Assessment/Plan Present on Admission:  41 year old female with acute generalized abdominal pain of unclear etiology  .Abdominal pain, acute, generalized .Nausea and vomiting .Dehydration .DVT of right leg (deep venous thrombosis) in 01/2011 .S/P hysterectomy 4/13  Exam pelvic is going to be done by the emergency room mid level. She cannot receive IV contrast because of the allergy in the form of rash. I'm going to obtain an abdominal ultrasound. Her LFTs and lipase are normal. General surgery has been consulted and called already by the emergency  room. We'll provide her with some IV fluids and keep n.p.o. for now until seen by general surgery. Will need to be careful with  IV narcotics as patient is already sedated in the emergency department.   Mariacristina Aday A [phone] 08/28/2011, 7:03 PM

## 2011-08-28 NOTE — ED Provider Notes (Signed)
Medical screening examination/treatment/procedure(s) were performed by non-physician practitioner and as supervising physician I was immediately available for consultation/collaboration.  Flint Melter, MD 08/28/11 2129

## 2011-08-28 NOTE — ED Provider Notes (Signed)
History     CSN: [id number]  Arrival date & time 08/28/11  9562   First MD Initiated Contact with Patient 08/28/11 1011      Chief Complaint  Patient presents with  . Abdominal Pain    (Consider location/radiation/quality/duration/timing/severity/associated sxs/prior treatment) Patient is a 41 y.o. female presenting with abdominal pain. The history is provided by the patient.  Abdominal Pain The primary symptoms of the illness include abdominal pain, fever, nausea and vomiting. The primary symptoms of the illness do not include shortness of breath, diarrhea, dysuria or vaginal bleeding. The onset of the illness was sudden. The problem has not changed since onset. The abdominal pain radiates to the periumbilical region. The abdominal pain is relieved by nothing. The abdominal pain is exacerbated by movement and coughing.  The patient states that she believes she is currently not pregnant. The patient has not had a change in bowel habit. Additional symptoms associated with the illness include diaphoresis. Symptoms associated with the illness do not include back pain. Associated symptoms comments: Sudden onset of pain around center of abdomen that has been constant since 5:00 a.m. 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 Pain began after she had returned home. She has had nausea with vomiting, reports sweating episodes. She took something at home to relieve abdominal gas x 2 but this neither produced any gas nor relieved any pain. She had a recent total hysterectomy but reports she has had several weeks of uncomplicated post-operative course without pain, bleeding or other symptom. .    Past Medical History  Diagnosis Date  . DVT (deep venous thrombosis)     ? vs lupus  . Fibroids   . Anemia   . Headache   . Difficult intravenous access     hands are usually best for blood draws and IV starts    Past Surgical History  Procedure Date  . Neck surgery     . Cervical spine surgery   . Myomectomy   . Wisdom tooth extraction   . Cesarean section 1996  . Abdominal hysterectomy 06/29/11    Robotic Assisted Hysterectomy    Family History  Problem Relation Age of Onset  . Anesthesia problems Neg Hx   . Hypertension Maternal Grandmother     History  Substance Use Topics  . Smoking status: Never Smoker   . Smokeless tobacco: Never Used  . Alcohol Use: No    OB History    Grav Para Term Preterm Abortions TAB SAB Ect Mult Living   2 1 1  0 1 0 1 0 0 1      Review of Systems  Constitutional: Positive for fever and diaphoresis.  Respiratory: Negative for cough and shortness of breath.   Cardiovascular: Negative for chest pain.  Gastrointestinal: Positive for nausea, vomiting and abdominal pain. Negative for diarrhea.  Genitourinary: Negative for dysuria, flank pain and vaginal bleeding.  Musculoskeletal: Negative for back pain.    Allergies  Iodine and Shrimp  Home Medications   Current Outpatient Rx  Name Route Sig Dispense Refill  . VITAMIN D 1000 UNITS PO TABS Oral Take 2,000 Units by mouth every morning.       BP 128/86  Pulse 95  Temp(Src) 97.7 F (36.5 C) (Oral)  Resp 24  SpO2 100%  LMP 02/27/2011  Physical Exam  Constitutional: She is oriented to person, place, and time. She appears well-developed and well-nourished. No distress.  HENT:  Mouth/Throat: Oropharynx is clear  and moist.  Cardiovascular: Normal rate.   No murmur heard. Pulmonary/Chest: Effort normal. She has no wheezes. She has no rales. She exhibits no tenderness.  Abdominal: She exhibits distension. There is tenderness.       Abdomen generally tender in all quadrants equally. Slightly distended and firm but not rigid. BS hypoactive.   Musculoskeletal: Normal range of motion.  Neurological: She is alert and oriented to person, place, and time.  Skin: Skin is dry. She is not diaphoretic.    ED Course  Procedures (including critical care  time)   Labs Reviewed  CBC  DIFFERENTIAL  COMPREHENSIVE METABOLIC PANEL  LIPASE, BLOOD  URINALYSIS, ROUTINE W REFLEX MICROSCOPIC   No results found. Results for orders placed during the hospital encounter of 08/28/11  CBC      Component Value Range   WBC 13.3 (*) 4.0 - 10.5 (K/uL)   RBC 4.65  3.87 - 5.11 (MIL/uL)   Hemoglobin 13.7  12.0 - 15.0 (g/dL)   HCT 65.7  84.6 - 96.2 (%)   MCV 86.9  78.0 - 100.0 (fL)   MCH 29.5  26.0 - 34.0 (pg)   MCHC 33.9  30.0 - 36.0 (g/dL)   RDW 95.2  84.1 - 32.4 (%)   Platelets 232  150 - 400 (K/uL)  DIFFERENTIAL      Component Value Range   Neutrophils Relative 88 (*) 43 - 77 (%)   Neutro Abs 11.7 (*) 1.7 - 7.7 (K/uL)   Lymphocytes Relative 6 (*) 12 - 46 (%)   Lymphs Abs 0.8  0.7 - 4.0 (K/uL)   Monocytes Relative 6  3 - 12 (%)   Monocytes Absolute 0.7  0.1 - 1.0 (K/uL)   Eosinophils Relative 0  0 - 5 (%)   Eosinophils Absolute 0.0  0.0 - 0.7 (K/uL)   Basophils Relative 0  0 - 1 (%)   Basophils Absolute 0.0  0.0 - 0.1 (K/uL)  COMPREHENSIVE METABOLIC PANEL      Component Value Range   Sodium 135  135 - 145 (mEq/L)   Potassium 3.4 (*) 3.5 - 5.1 (mEq/L)   Chloride 100  96 - 112 (mEq/L)   CO2 22  19 - 32 (mEq/L)   Glucose, Bld 110 (*) 70 - 99 (mg/dL)   BUN 11  6 - 23 (mg/dL)   Creatinine, Ser 4.01  0.50 - 1.10 (mg/dL)   Calcium 9.4  8.4 - 02.7 (mg/dL)   Total Protein 8.9 (*) 6.0 - 8.3 (g/dL)   Albumin 4.1  3.5 - 5.2 (g/dL)   AST 17  0 - 37 (U/L)   ALT 13  0 - 35 (U/L)   Alkaline Phosphatase 82  39 - 117 (U/L)   Total Bilirubin 0.4  0.3 - 1.2 (mg/dL)   GFR calc non Af Amer >90  >90 (mL/min)   GFR calc Af Amer >90  >90 (mL/min)  LIPASE, BLOOD      Component Value Range   Lipase 16  11 - 59 (U/L)  URINALYSIS, ROUTINE W REFLEX MICROSCOPIC      Component Value Range   Color, Urine YELLOW  YELLOW    APPearance CLEAR  CLEAR    Specific Gravity, Urine 1.027  1.005 - 1.030    pH 6.0  5.0 - 8.0    Glucose, UA NEGATIVE  NEGATIVE (mg/dL)    Hgb urine dipstick NEGATIVE  NEGATIVE    Bilirubin Urine NEGATIVE  NEGATIVE    Ketones, ur 15 (*) NEGATIVE (mg/dL)  Protein, ur NEGATIVE  NEGATIVE (mg/dL)   Urobilinogen, UA 1.0  0.0 - 1.0 (mg/dL)   Nitrite NEGATIVE  NEGATIVE    Leukocytes, UA NEGATIVE  NEGATIVE    Ct Abdomen Pelvis Wo Contrast  08/28/2011  **ADDENDUM** CREATED: 08/28/2011 17:06:10  Original report read by Dr. Constance Goltz.  Addendum by Dr. Purcell Mouton. Review of the study was requested by the emergency department at this patient who reportedly underwent total hysterectomy on 06/29/2011. On review of the pathology from that surgical procedure, there is no mention of ovarian tissue in the specimen. Therefore, the right adnexal finding could be related to an ovarian remnant. Alternately, this could reflect a postsurgical finding.  As stated, pelvic ultrasound may be helpful for further evaluation as clinically warranted.  **END ADDENDUM** SIGNED BY: Gerrianne Scale, M.D.   08/28/2011  *RADIOLOGY REPORT*  Clinical Data: Central lower abdominal pain.  CT ABDOMEN AND PELVIS WITHOUT CONTRAST  Technique:  Multidetector CT imaging of the abdomen and pelvis was performed following the standard protocol without intravenous contrast.  Comparison: None.  Findings: Within the right aspect of the pelvis, there is a complex slightly dense 3.4 x 3.6 x 3.3 cm lesion which may represent a complex ovarian process possibly a hemorrhagic cyst.  Sonogram would prove helpful for further delineation.  There is a minimal amount of adjacent fluid which extends along the sigmoid colon. This may be related to the ovarian process rather than primary sigmoid colitis.  The cecum is displaced medially and superiorly.  No inflammation surrounds the appendix.  Scattered top normal size loops of small bowel without obstruction noted.  Evaluation of solid abdominal viscera is limited by lack of IV contrast.  Taking this limitation into account no focal hepatic, splenic, pancreatic, renal  or adrenal lesion.  No calcified gallstones.  No free intraperitoneal air.  Minimal atelectatic changes lung bases.  No abdominal aortic aneurysm.  Small hiatal hernia.  Degenerative changes lower lumbar spine.  IMPRESSION: Within the right aspect of the pelvis, there is a complex slightly dense 3.4 x 3.6 x 3.3 cm lesion which may represent a complex ovarian process possibly a hemorrhagic cyst.  Sonogram would prove helpful for further delineation.  There is a minimal amount of adjacent fluid which extends along the sigmoid colon.  This may be related to the ovarian process rather than primary sigmoid colitis.  The cecum is displaced medially and superiorly.  No inflammation surrounds the appendix.  Scattered top normal size loops of small bowel without obstruction noted.  Original Report Authenticated By: Gerrianne Scale, M.D.   Dg Abd Acute W/chest  08/28/2011  *RADIOLOGY REPORT*  Clinical Data: Abdominal pain, shortness of breath  ACUTE ABDOMEN SERIES (ABDOMEN 2 VIEW & CHEST 1 VIEW)  Comparison: Chest x-ray of 04/05/2011 and lumbar spine film of 07/09/2011  Findings: The lungs are poorly aerated and there is compression of markings at the lung bases.  No definite active process is seen. Heart size is normal.  A lower anterior cervical spine fusion plate is present.  Supine and left lateral decubitus films of the abdomen show both large and small bowel gas to be present.  No definite bowel obstruction is seen.  No free intraperitoneal air is noted.  No opaque calculi are seen.  IMPRESSION:  1.  Poor inspiration.  No active process. 2.  No evidence of bowel obstruction.  Both large and small bowel gas is present.  No free air.  Original Report Authenticated By: Juline Patch, M.D.  No diagnosis found.  1. Abdominal pain   MDM  Pain initially difficult to manage with complaint of persistent pain by patient, however, patient sleeping frequently and appears comfortable and NAD. CT scan showing ?ovarian  cyst. Discussed finding with Dr. Steele Berg who reviews CT again given additional information of total hysterectomy on 06/29/11 - ? Post-op fluid collection vs. Hematoma. Pain is diffuse abdomen and does not correlate to pelvic finding. She can be discharged home to follow up with primary care on Monday and return if worse.   Patient, on discharge, reports not able to go home due to persistent pain. Re-evaluation - mild to mod. Distention, diffuse tenderness recurrent/persistent. Decision made to admit the patient and obtain a fully contrasted study with allergy prep. [facility] to admit.Marland Kitchen Requested surgical consult - call made.        Rodena Medin, PA-C 08/28/11 1756  Rodena Medin, PA-C 08/28/11 1933

## 2011-08-29 ENCOUNTER — Inpatient Hospital Stay (HOSPITAL_COMMUNITY): Payer: Medicaid Other

## 2011-08-29 DIAGNOSIS — R112 Nausea with vomiting, unspecified: Secondary | ICD-10-CM

## 2011-08-29 DIAGNOSIS — R1084 Generalized abdominal pain: Secondary | ICD-10-CM

## 2011-08-29 DIAGNOSIS — M545 Low back pain: Secondary | ICD-10-CM

## 2011-08-29 LAB — CBC
HCT: 33.4 % — ABNORMAL LOW (ref 36.0–46.0)
MCHC: 32.3 g/dL (ref 30.0–36.0)
MCV: 88.8 fL (ref 78.0–100.0)
Platelets: 199 10*3/uL (ref 150–400)
RDW: 13.2 % (ref 11.5–15.5)
WBC: 10.7 10*3/uL — ABNORMAL HIGH (ref 4.0–10.5)

## 2011-08-29 LAB — COMPREHENSIVE METABOLIC PANEL
ALT: 8 U/L (ref 0–35)
Albumin: 2.8 g/dL — ABNORMAL LOW (ref 3.5–5.2)
Alkaline Phosphatase: 63 U/L (ref 39–117)
BUN: 8 mg/dL (ref 6–23)
Chloride: 104 mEq/L (ref 96–112)
Glucose, Bld: 97 mg/dL (ref 70–99)
Potassium: 3.4 mEq/L — ABNORMAL LOW (ref 3.5–5.1)
Sodium: 135 mEq/L (ref 135–145)
Total Bilirubin: 0.6 mg/dL (ref 0.3–1.2)

## 2011-08-29 LAB — URINALYSIS, ROUTINE W REFLEX MICROSCOPIC
Bilirubin Urine: NEGATIVE
Leukocytes, UA: NEGATIVE
Nitrite: NEGATIVE
Specific Gravity, Urine: 1.022 (ref 1.005–1.030)
Urobilinogen, UA: 1 mg/dL (ref 0.0–1.0)
pH: 6 (ref 5.0–8.0)

## 2011-08-29 LAB — CK: Total CK: 99 U/L (ref 7–177)

## 2011-08-29 MED ORDER — HYDROMORPHONE HCL PF 1 MG/ML IJ SOLN
1.0000 mg | INTRAMUSCULAR | Status: DC | PRN
  Administered 2011-08-29 – 2011-08-31 (×16): 1 mg via INTRAVENOUS
  Filled 2011-08-29 (×15): qty 1

## 2011-08-29 MED ORDER — DIPHENHYDRAMINE HCL 50 MG/ML IJ SOLN
12.5000 mg | INTRAMUSCULAR | Status: DC | PRN
  Administered 2011-08-29 – 2011-08-31 (×7): 12.5 mg via INTRAVENOUS
  Filled 2011-08-29 (×7): qty 1

## 2011-08-29 MED ORDER — DOCUSATE SODIUM 100 MG PO CAPS
100.0000 mg | ORAL_CAPSULE | Freq: Two times a day (BID) | ORAL | Status: DC
  Administered 2011-08-29 – 2011-08-30 (×3): 100 mg via ORAL
  Filled 2011-08-29 (×6): qty 1

## 2011-08-29 MED ORDER — GADOBENATE DIMEGLUMINE 529 MG/ML IV SOLN
20.0000 mL | Freq: Once | INTRAVENOUS | Status: AC | PRN
  Administered 2011-08-29: 20 mL via INTRAVENOUS

## 2011-08-29 MED ORDER — SODIUM CHLORIDE 0.9 % IV SOLN: INTRAVENOUS | Status: DC

## 2011-08-29 MED ORDER — ACETAMINOPHEN 325 MG PO TABS: 650.0000 mg | ORAL_TABLET | Freq: Four times a day (QID) | ORAL | Status: DC | PRN

## 2011-08-29 MED ORDER — DIPHENHYDRAMINE HCL 50 MG/ML IJ SOLN
12.5000 mg | Freq: Four times a day (QID) | INTRAMUSCULAR | Status: DC | PRN
  Administered 2011-08-29 (×2): 12.5 mg via INTRAVENOUS
  Filled 2011-08-29 (×2): qty 1

## 2011-08-29 MED ORDER — PROMETHAZINE HCL 25 MG/ML IJ SOLN: 12.5000 mg | Freq: Four times a day (QID) | INTRAMUSCULAR | Status: DC | PRN

## 2011-08-29 MED ORDER — SODIUM CHLORIDE 0.9 % IV SOLN
INTRAVENOUS | Status: DC
  Administered 2011-08-30: 09:00:00 via INTRAVENOUS
  Filled 2011-08-29 (×3): qty 1000

## 2011-08-29 NOTE — Progress Notes (Addendum)
Patient ID: [name]  female  WUJ:[id number]    DOB: 1970-08-16    DOA: 08/28/2011  PCP: Alva Garnet., MD, MD  Subjective: Complaining of right-sided abdominal pain Also c/o urinary incontinence, lower back pain, bilateral lower extremity numbness and tingling which started yesterday however improved today, also feels lower extremely weakness  Objective: Weight change:   Intake/Output Summary (Last 24 hours) at 08/29/11 1424 Last data filed at 08/29/11 1300  Gross per 24 hour  Intake      0 ml  Output    125 ml  Net   -125 ml   Blood pressure 127/86, pulse 109, temperature 99.6 F (37.6 C), temperature source Oral, resp. rate 18, height 5\' 4"  (1.626 m), weight 107.8 kg (237 lb 10.5 oz), last menstrual period 02/27/2011, SpO2 95.00%.  Physical Exam: General: Alert and awake, oriented x3, not in any acute distress. HEENT: anicteric sclera, pupils reactive to light and accommodation, EOMI CVS: S1-S2 clear, no murmur rubs or gallops Chest: clear to auscultation bilaterally, no wheezing, rales or rhonchi Abdomen: soft, diffusely tender, nondistended, normal bowel sounds, no organomegaly Extremities: no cyanosis, clubbing or edema noted bilaterally Neuro: Cranial nerves II-XII intact, patient unable to ambulate, states feels weak   Lab Results: Basic Metabolic Panel:  Lab 08/29/11 9562 08/28/11 1055  NA 135 135  K 3.4* 3.4*  CL 104 100  CO2 23 22  GLUCOSE 97 110*  BUN 8 11  CREATININE 0.70 0.70  CALCIUM 8.0* 9.4  MG -- --  PHOS -- --   Liver Function Tests:  Lab 08/29/11 0415 08/28/11 1055  AST 11 17  ALT 8 13  ALKPHOS 63 82  BILITOT 0.6 0.4  PROT 6.6 8.9*  ALBUMIN 2.8* 4.1    Lab 08/28/11 1055  LIPASE 16  AMYLASE --   CBC:  Lab 08/29/11 0415 08/28/11 1055  WBC 10.7* 13.3*  NEUTROABS -- 11.7*  HGB 10.8* 13.7  HCT 33.4* 40.4  MCV 88.8 86.9  PLT 199 232    Micro Results: No results found for this or any previous visit (from the past 240  hour(s)).  Studies/Results: Ct Abdomen Pelvis Wo Contrast  08/28/2011  **ADDENDUM** CREATED: 08/28/2011 17:06:10  Original report read by Dr. Constance Goltz.  Addendum by Dr. Purcell Mouton. Review of the study was requested by the emergency department at this patient who reportedly underwent total hysterectomy on 06/29/2011. On review of the pathology from that surgical procedure, there is no mention of ovarian tissue in the specimen. Therefore, the right adnexal finding could be related to an ovarian remnant. Alternately, this could reflect a postsurgical finding.  As stated, pelvic ultrasound may be helpful for further evaluation as clinically warranted.  **END ADDENDUM** SIGNED BY: Gerrianne Scale, M.D.   08/28/2011  *RADIOLOGY REPORT*  Clinical Data: Central lower abdominal pain.  CT ABDOMEN AND PELVIS WITHOUT CONTRAST  Technique:  Multidetector CT imaging of the abdomen and pelvis was performed following the standard protocol without intravenous contrast.  Comparison: None.  Findings: Within the right aspect of the pelvis, there is a complex slightly dense 3.4 x 3.6 x 3.3 cm lesion which may represent a complex ovarian process possibly a hemorrhagic cyst.  Sonogram would prove helpful for further delineation.  There is a minimal amount of adjacent fluid which extends along the sigmoid colon. This may be related to the ovarian process rather than primary sigmoid colitis.  The cecum is displaced medially and superiorly.  No inflammation surrounds the appendix.  Scattered top  normal size loops of small bowel without obstruction noted.  Evaluation of solid abdominal viscera is limited by lack of IV contrast.  Taking this limitation into account no focal hepatic, splenic, pancreatic, renal or adrenal lesion.  No calcified gallstones.  No free intraperitoneal air.  Minimal atelectatic changes lung bases.  No abdominal aortic aneurysm.  Small hiatal hernia.  Degenerative changes lower lumbar spine.  IMPRESSION: Within the right  aspect of the pelvis, there is a complex slightly dense 3.4 x 3.6 x 3.3 cm lesion which may represent a complex ovarian process possibly a hemorrhagic cyst.  Sonogram would prove helpful for further delineation.  There is a minimal amount of adjacent fluid which extends along the sigmoid colon.  This may be related to the ovarian process rather than primary sigmoid colitis.  The cecum is displaced medially and superiorly.  No inflammation surrounds the appendix.  Scattered top normal size loops of small bowel without obstruction noted.  Original Report Authenticated By: Gerrianne Scale, M.D.   Dg Abd Acute W/chest  08/28/2011  *RADIOLOGY REPORT*  Clinical Data: Abdominal pain, shortness of breath  ACUTE ABDOMEN SERIES (ABDOMEN 2 VIEW & CHEST 1 VIEW)  Comparison: Chest x-ray of 04/05/2011 and lumbar spine film of 07/09/2011  Findings: The lungs are poorly aerated and there is compression of markings at the lung bases.  No definite active process is seen. Heart size is normal.  A lower anterior cervical spine fusion plate is present.  Supine and left lateral decubitus films of the abdomen show both large and small bowel gas to be present.  No definite bowel obstruction is seen.  No free intraperitoneal air is noted.  No opaque calculi are seen.  IMPRESSION:  1.  Poor inspiration.  No active process. 2.  No evidence of bowel obstruction.  Both large and small bowel gas is present.  No free air.  Original Report Authenticated By: Juline Patch, M.D.    Medications: Scheduled Meds:   . docusate sodium  100 mg Oral BID  .  HYDROmorphone (DILAUDID) injection  1 mg Intravenous Once  . LORazepam  1 mg Intravenous Once   Continuous Infusions:   . 0.9 % NaCl with KCl 20 mEq / L 75 mL/hr at 08/29/11 0147  . sodium chloride 0.9 % 1,000 mL with potassium chloride 20 mEq infusion 75 mL/hr at 08/29/11 1400  . DISCONTD: sodium chloride       Assessment/Plan: Principal Problem:  *Abdominal pain, acute,  generalized: Unclear etiology -CT abdomen and pelvis done this morning shows no acute intra-abdominal pathology however has complex slightly dense 3.4x3.6x3.3 lesion which may represent a complex ovarian process possibly a hemorrhagic cyst. - Pelvic and transvaginal ultrasound ordered - Continue IV fluids and pain control   Bilateral lower extremity weakness with urine continence: Patient is a vague historian however her mother reports that she had a fall a month ago.  -UA and culture to rule out UTI, MRI lumbar spine to r/o DJD, nerve root compression  Active Problems:  DVT of right leg (deep venous thrombosis) in 01/2011: Not on any anticoagulation   Nausea and vomiting: Continue Zofran and Phenergan when necessary   Dehydration: Continued gentle hydration   S/P hysterectomy 4/13  DVT Prophylaxis: SCDs  Code Status:  Disposition: Not medically ready   LOS: 1 day   Anaira Seay M.D. [facility] 08/29/2011, 2:24 PM Pager: [phone]   Addendum:  MRI lumbar spine reviewed done today 08/29/2011 IMPRESSION:  1. Central disc extrusion  at L5-S1 could encroach on either S1  nerve root.  2. No other significant disc space findings. There is mild disc  bulging at L3-L4 and L4-L5.  3. The distal thoracic cord and conus medullaris appear normal.  4. Probable right adnexal hematoma as demonstrated on CT  yesterday. If the patient still has a right ovary, this could  reflect a hemorrhagic cyst. If the ovaries were removed at  hysterectomy, this could reflect a postoperative finding.  I discussed in detail with Dr. Venetia Maxon (Neurosurgery on call) who reviewed the MRI films and recommended no neurosurgical intervention at this time. Per Dr. Venetia Maxon, it is likely chronic and patient can follow-up in office if she continues to have back pain and above symptoms.  - Will obtain PT/OT evaluation - awaiting pelvic/TV ultrasound for the right adnexal ?cyst vs hematoma (history of recent  hysterectomy) - UA to r/o UTI   Elayjah Chaney M.D. [facility] 08/29/2011, 4:11 PM  Pager: [phone]

## 2011-08-30 ENCOUNTER — Inpatient Hospital Stay (HOSPITAL_COMMUNITY): Payer: Medicaid Other

## 2011-08-30 DIAGNOSIS — M79609 Pain in unspecified limb: Secondary | ICD-10-CM

## 2011-08-30 DIAGNOSIS — M7989 Other specified soft tissue disorders: Secondary | ICD-10-CM

## 2011-08-30 DIAGNOSIS — R0902 Hypoxemia: Secondary | ICD-10-CM

## 2011-08-30 DIAGNOSIS — R112 Nausea with vomiting, unspecified: Secondary | ICD-10-CM

## 2011-08-30 DIAGNOSIS — R1084 Generalized abdominal pain: Secondary | ICD-10-CM

## 2011-08-30 DIAGNOSIS — M545 Low back pain: Secondary | ICD-10-CM

## 2011-08-30 LAB — DIFFERENTIAL
Lymphocytes Relative: 10 % — ABNORMAL LOW (ref 12–46)
Lymphs Abs: 1.1 10*3/uL (ref 0.7–4.0)
Monocytes Relative: 10 % (ref 3–12)
Neutrophils Relative %: 79 % — ABNORMAL HIGH (ref 43–77)

## 2011-08-30 LAB — CARDIAC PANEL(CRET KIN+CKTOT+MB+TROPI)
CK, MB: 2.2 ng/mL (ref 0.3–4.0)
Relative Index: INVALID (ref 0.0–2.5)
Total CK: 79 U/L (ref 7–177)

## 2011-08-30 LAB — BASIC METABOLIC PANEL
Calcium: 8.1 mg/dL — ABNORMAL LOW (ref 8.4–10.5)
GFR calc Af Amer: >90 mL/min (ref >90)
GFR calc non Af Amer: >90 mL/min (ref >90)
Glucose, Bld: 83 mg/dL (ref 70–99)
Potassium: 3.9 mEq/L (ref 3.5–5.1)
Sodium: 131 mEq/L — ABNORMAL LOW (ref 135–145)

## 2011-08-30 LAB — SEDIMENTATION RATE: Sed Rate: 46 mm/hr — ABNORMAL HIGH (ref 0–22)

## 2011-08-30 LAB — CBC
Hemoglobin: 10.9 g/dL — ABNORMAL LOW (ref 12.0–15.0)
MCH: 29.6 pg (ref 26.0–34.0)
MCHC: 33.8 g/dL (ref 30.0–36.0)
MCV: 88.4 fL (ref 78.0–100.0)
MCV: 87.5 fL (ref 78.0–100.0)
Platelets: 183 10*3/uL (ref 150–400)
Platelets: 201 10*3/uL (ref 150–400)
RBC: 3.7 MIL/uL — ABNORMAL LOW (ref 3.87–5.11)
WBC: 11.1 10*3/uL — ABNORMAL HIGH (ref 4.0–10.5)

## 2011-08-30 LAB — D-DIMER, QUANTITATIVE: D-Dimer, Quant: 2.53 ug/mL-FEU — ABNORMAL HIGH (ref 0.00–0.48)

## 2011-08-30 LAB — STREP PNEUMONIAE URINARY ANTIGEN: Strep Pneumo Urinary Antigen: NEGATIVE

## 2011-08-30 LAB — PROTIME-INR: Prothrombin Time: 14.4 seconds (ref 11.6–15.2)

## 2011-08-30 MED ORDER — TECHNETIUM TO 99M ALBUMIN AGGREGATED
3.0000 | Freq: Once | INTRAVENOUS | Status: AC | PRN
  Administered 2011-08-30: 3 via INTRAVENOUS

## 2011-08-30 MED ORDER — SODIUM CHLORIDE 0.9 % IJ SOLN
10.0000 mL | Freq: Two times a day (BID) | INTRAMUSCULAR | Status: DC
  Administered 2011-08-30 – 2011-08-31 (×2): 10 mL

## 2011-08-30 MED ORDER — FUROSEMIDE 10 MG/ML IJ SOLN
40.0000 mg | Freq: Once | INTRAMUSCULAR | Status: AC
  Administered 2011-08-30: 40 mg via INTRAVENOUS
  Filled 2011-08-30: qty 4

## 2011-08-30 MED ORDER — MOXIFLOXACIN HCL IN NACL 400 MG/250ML IV SOLN
400.0000 mg | INTRAVENOUS | Status: DC
  Administered 2011-08-30 – 2011-08-31 (×2): 400 mg via INTRAVENOUS
  Filled 2011-08-30 (×2): qty 250

## 2011-08-30 MED ORDER — LORAZEPAM 2 MG/ML IJ SOLN
1.0000 mg | Freq: Once | INTRAMUSCULAR | Status: AC
  Administered 2011-08-30: 1 mg via INTRAVENOUS

## 2011-08-30 MED ORDER — NITROGLYCERIN 0.3 MG SL SUBL
0.3000 mg | SUBLINGUAL_TABLET | SUBLINGUAL | Status: AC | PRN
  Administered 2011-08-30 (×3): 0.3 mg via SUBLINGUAL
  Filled 2011-08-30: qty 100

## 2011-08-30 MED ORDER — LEVALBUTEROL HCL 0.63 MG/3ML IN NEBU
0.6300 mg | INHALATION_SOLUTION | Freq: Four times a day (QID) | RESPIRATORY_TRACT | Status: DC | PRN
  Filled 2011-08-30: qty 3

## 2011-08-30 MED ORDER — SODIUM CHLORIDE 0.9 % IJ SOLN: 10.0000 mL | INTRAMUSCULAR | Status: DC | PRN

## 2011-08-30 MED ORDER — GI COCKTAIL ~~LOC~~
30.0000 mL | Freq: Once | ORAL | Status: AC
  Administered 2011-08-30: 30 mL via ORAL
  Filled 2011-08-30: qty 30

## 2011-08-30 MED ORDER — LORAZEPAM 2 MG/ML IJ SOLN
INTRAMUSCULAR | Status: AC
  Filled 2011-08-30: qty 1

## 2011-08-30 NOTE — Progress Notes (Signed)
VASCULAR LAB PRELIMINARY  PRELIMINARY  PRELIMINARY  PRELIMINARY  Bilateral lower extremity venous Dopplers completed.    Preliminary report:  There is no DVT or SVT noted in the bilateral lower extremities.  Sherren Kerns Butler, 08/30/2011, 1:09 PM

## 2011-08-30 NOTE — Progress Notes (Signed)
Pt complaining on SOB when I walked into the room. Her oxygen saturation was 80 on RA after taking pulse ox reading on patient's right toe. I placed 2L of oxygen on the patient and demonstrated the proper use of the incentive spirometer. I instructed her to use it 10 times every hour when she was awake to help expand her lungs.

## 2011-08-30 NOTE — Consult Note (Signed)
Reason for Consult:Weakness in lowers and urinary incontinence. Referring Physician: Letitia Caul [name] [name] is an 41 y.o. female.  HPI: Low back pain and weakness in lowers and "urinary Incontinence"  Past Medical History  Diagnosis Date  . DVT (deep venous thrombosis)     ? vs lupus  . Fibroids   . Anemia   . Headache   . Difficult intravenous access     hands are usually best for blood draws and IV starts  . Complication of anesthesia     slow to wake up  . Scoliosis     Past Surgical History  Procedure Date  . Neck surgery   . Cervical spine surgery   . Myomectomy   . Wisdom tooth extraction   . Cesarean section 1996  . Abdominal hysterectomy 06/29/11    Robotic Assisted Hysterectomy    Family History  Problem Relation Age of Onset  . Anesthesia problems Neg Hx   . Hypertension Maternal Grandmother     Social History:  reports that she has never smoked. She has never used smokeless tobacco. She reports that she does not drink alcohol or use illicit drugs.  Allergies:  Allergies  Allergen Reactions  . Iodine Hives  . Shrimp (Shellfish Allergy) Hives    Medications: I have reviewed the patient's current medications.  Results for orders placed during the hospital encounter of 08/28/11 (from the past 48 hour(s))  LACTIC ACID, PLASMA     Status: Normal   Collection Time   08/28/11 11:10 PM      Component Value Range Comment   Lactic Acid, Venous 0.7  0.5 - 2.2 (mmol/L)   COMPREHENSIVE METABOLIC PANEL     Status: Abnormal   Collection Time   08/29/11  4:15 AM      Component Value Range Comment   Sodium 135  135 - 145 (mEq/L)    Potassium 3.4 (*) 3.5 - 5.1 (mEq/L)    Chloride 104  96 - 112 (mEq/L)    CO2 23  19 - 32 (mEq/L)    Glucose, Bld 97  70 - 99 (mg/dL)    BUN 8  6 - 23 (mg/dL)    Creatinine, Ser 1.61  0.50 - 1.10 (mg/dL)    Calcium 8.0 (*) 8.4 - 10.5 (mg/dL)    Total Protein 6.6  6.0 - 8.3 (g/dL)    Albumin 2.8 (*) 3.5 - 5.2 (g/dL)    AST 11  0 - 37  (U/L)    ALT 8  0 - 35 (U/L)    Alkaline Phosphatase 63  39 - 117 (U/L)    Total Bilirubin 0.6  0.3 - 1.2 (mg/dL)    GFR calc non Af Amer >90  >90 (mL/min)    GFR calc Af Amer >90  >90 (mL/min)   CBC     Status: Abnormal   Collection Time   08/29/11  4:15 AM      Component Value Range Comment   WBC 10.7 (*) 4.0 - 10.5 (K/uL)    RBC 3.76 (*) 3.87 - 5.11 (MIL/uL)    Hemoglobin 10.8 (*) 12.0 - 15.0 (g/dL)    HCT 09.6 (*) 04.5 - 46.0 (%)    MCV 88.8  78.0 - 100.0 (fL)    MCH 28.7  26.0 - 34.0 (pg)    MCHC 32.3  30.0 - 36.0 (g/dL)    RDW 40.9  81.1 - 91.4 (%)    Platelets 199  150 - 400 (K/uL)  CK     Status: Normal   Collection Time   08/29/11  4:45 PM      Component Value Range Comment   Total CK 99  7 - 177 (U/L)   C-REACTIVE PROTEIN     Status: Abnormal   Collection Time   08/29/11  6:35 PM      Component Value Range Comment   CRP 12.65 (*) <0.60 (mg/dL)   URINALYSIS, ROUTINE W REFLEX MICROSCOPIC     Status: Abnormal   Collection Time   08/29/11 10:37 PM      Component Value Range Comment   Color, Urine YELLOW  YELLOW     APPearance CLOUDY (*) CLEAR     Specific Gravity, Urine 1.022  1.005 - 1.030     pH 6.0  5.0 - 8.0     Glucose, UA NEGATIVE  NEGATIVE (mg/dL)    Hgb urine dipstick NEGATIVE  NEGATIVE     Bilirubin Urine NEGATIVE  NEGATIVE     Ketones, ur 40 (*) NEGATIVE (mg/dL)    Protein, ur NEGATIVE  NEGATIVE (mg/dL)    Urobilinogen, UA 1.0  0.0 - 1.0 (mg/dL)    Nitrite NEGATIVE  NEGATIVE     Leukocytes, UA NEGATIVE  NEGATIVE  MICROSCOPIC NOT DONE ON URINES WITH NEGATIVE PROTEIN, BLOOD, LEUKOCYTES, NITRITE, OR GLUCOSE <1000 mg/dL.  SEDIMENTATION RATE     Status: Abnormal   Collection Time   08/29/11 11:28 PM      Component Value Range Comment   Sed Rate 46 (*) 0 - 22 (mm/hr)   CBC     Status: Abnormal   Collection Time   08/29/11 11:28 PM      Component Value Range Comment   WBC 11.1 (*) 4.0 - 10.5 (K/uL)    RBC 3.70 (*) 3.87 - 5.11 (MIL/uL)    Hemoglobin 10.9 (*) 12.0  - 15.0 (g/dL)    HCT 21.3 (*) 08.6 - 46.0 (%)    MCV 88.4  78.0 - 100.0 (fL)    MCH 29.5  26.0 - 34.0 (pg)    MCHC 33.3  30.0 - 36.0 (g/dL)    RDW 57.8  46.9 - 62.9 (%)    Platelets 183  150 - 400 (K/uL)   DIFFERENTIAL     Status: Abnormal   Collection Time   08/29/11 11:28 PM      Component Value Range Comment   Neutrophils Relative 79 (*) 43 - 77 (%)    Neutro Abs 8.8 (*) 1.7 - 7.7 (K/uL)    Lymphocytes Relative 10 (*) 12 - 46 (%)    Lymphs Abs 1.1  0.7 - 4.0 (K/uL)    Monocytes Relative 10  3 - 12 (%)    Monocytes Absolute 1.2 (*) 0.1 - 1.0 (K/uL)    Eosinophils Relative 0  0 - 5 (%)    Eosinophils Absolute 0.0  0.0 - 0.7 (K/uL)    Basophils Relative 0  0 - 1 (%)    Basophils Absolute 0.0  0.0 - 0.1 (K/uL)   CARDIAC PANEL(CRET KIN+CKTOT+MB+TROPI)     Status: Normal   Collection Time   08/30/11  8:29 AM      Component Value Range Comment   Total CK 79  7 - 177 (U/L)    CK, MB 2.2  0.3 - 4.0 (ng/mL)    Troponin I <0.30  <0.30 (ng/mL)    Relative Index RELATIVE INDEX IS INVALID  0.0 - 2.5    D-DIMER, QUANTITATIVE  Status: Abnormal   Collection Time   08/30/11  8:29 AM      Component Value Range Comment   D-Dimer, Quant 2.53 (*) 0.00 - 0.48 (ug/mL-FEU)   BASIC METABOLIC PANEL     Status: Abnormal   Collection Time   08/30/11  8:30 AM      Component Value Range Comment   Sodium 131 (*) 135 - 145 (mEq/L)    Potassium 3.9  3.5 - 5.1 (mEq/L)    Chloride 99  96 - 112 (mEq/L)    CO2 20  19 - 32 (mEq/L)    Glucose, Bld 83  70 - 99 (mg/dL)    BUN 5 (*) 6 - 23 (mg/dL)    Creatinine, Ser 0.98  0.50 - 1.10 (mg/dL)    Calcium 8.1 (*) 8.4 - 10.5 (mg/dL)    GFR calc non Af Amer >90  >90 (mL/min)    GFR calc Af Amer >90  >90 (mL/min)     Ct Abdomen Pelvis Wo Contrast  08/28/2011  **ADDENDUM** CREATED: 08/28/2011 17:06:10  Original report read by Dr. Constance Goltz.  Addendum by Dr. Purcell Mouton. Review of the study was requested by the emergency department at this patient who reportedly underwent total  hysterectomy on 06/29/2011. On review of the pathology from that surgical procedure, there is no mention of ovarian tissue in the specimen. Therefore, the right adnexal finding could be related to an ovarian remnant. Alternately, this could reflect [name] postsurgical finding.  As stated, pelvic ultrasound may be helpful for further evaluation as clinically warranted.  **END ADDENDUM** SIGNED BY: Gerrianne Scale, M.D.   08/28/2011  *RADIOLOGY REPORT*  Clinical Data: Central lower abdominal pain.  CT ABDOMEN AND PELVIS WITHOUT CONTRAST  Technique:  Multidetector CT imaging of the abdomen and pelvis was performed following the standard protocol without intravenous contrast.  Comparison: None.  Findings: Within the right aspect of the pelvis, there is [name] complex slightly dense 3.4 x 3.6 x 3.3 cm lesion which may represent [name] complex ovarian process possibly [name] hemorrhagic cyst.  Sonogram would prove helpful for further delineation.  There is [name] minimal amount of adjacent fluid which extends along the sigmoid colon. This may be related to the ovarian process rather than primary sigmoid colitis.  The cecum is displaced medially and superiorly.  No inflammation surrounds the appendix.  Scattered top normal size loops of small bowel without obstruction noted.  Evaluation of solid abdominal viscera is limited by lack of IV contrast.  Taking this limitation into account no focal hepatic, splenic, pancreatic, renal or adrenal lesion.  No calcified gallstones.  No free intraperitoneal air.  Minimal atelectatic changes lung bases.  No abdominal aortic aneurysm.  Small hiatal hernia.  Degenerative changes lower lumbar spine.  IMPRESSION: Within the right aspect of the pelvis, there is [name] complex slightly dense 3.4 x 3.6 x 3.3 cm lesion which may represent [name] complex ovarian process possibly [name] hemorrhagic cyst.  Sonogram would prove helpful for further delineation.  There is [name] minimal amount of adjacent fluid which extends along the  sigmoid colon.  This may be related to the ovarian process rather than primary sigmoid colitis.  The cecum is displaced medially and superiorly.  No inflammation surrounds the appendix.  Scattered top normal size loops of small bowel without obstruction noted.  Original Report Authenticated By: Gerrianne Scale, M.D.   Mr Lumbar Spine W Wo Contrast  08/29/2011  *RADIOLOGY REPORT*  Clinical Data: Severe abdominal pain status post hysterectomy 2  months ago.  Now with bilateral leg numbness and urinary incontinence.  MRI LUMBAR SPINE WITHOUT AND WITH CONTRAST  Technique:  Multiplanar and multiecho pulse sequences of the lumbar spine were obtained without and with intravenous contrast.  Contrast: 20mL MULTIHANCE GADOBENATE DIMEGLUMINE 529 MG/ML IV SOLN  Comparison: Abdominal pelvic CT 08/28/2011.  Findings: There are five lumbar type vertebral bodies.  There is [name] mild convex right scoliosis.  No fracture or pars defect is present.  The conus medullaris extends to the upper L2 level and appears normal. There are no paraspinal abnormalities. There is no abnormal intradural enhancement.  As demonstrated on yesterday's CT, there is [name] right adnexal lesion measuring approximately 3.8 x 2.1 x 3.0 cm.  This demonstrates central high T1 signal, most consistent with [name] hematoma.  There is mild surrounding soft tissue edema.  There are no significant disc space findings from T11-T12 through L2-L3.  L3-L4:  Minimal disc bulge and mild facet hypertrophy.  No spinal stenosis or nerve root encroachment.  L4-L5:  Mild disc bulging and mild bilateral facet hypertrophy.  No spinal stenosis or nerve root encroachment.  L5-S1:  There is [name] moderate sized central disc extrusion with mild cranial extension of disc material within the ventral epidural space posterior to the L5 vertebral body.  This is largely contained within the ventral epidural fat but could encroach on either S1 nerve root.  There is no foraminal compromise or L5 nerve  root encroachment.  There is mild bilateral facet hypertrophy.  IMPRESSION:  1.  Central disc extrusion at L5-S1 could encroach on either S1 nerve root. 2.  No other significant disc space findings.  There is mild disc bulging at L3-L4 and L4-L5. 3.  The distal thoracic cord and conus medullaris appear normal. 4.  Probable right adnexal hematoma as demonstrated on CT yesterday.  If the patient still has [name] right ovary, this could reflect [name] hemorrhagic cyst.  If the ovaries were removed at hysterectomy, this could reflect [name] postoperative finding.  Original Report Authenticated By: Gerrianne Scale, M.D.   US Transvaginal Non-ob  08/29/2011  *RADIOLOGY REPORT*  Clinical Data: 3.6 cm right adnexal lesion seen on noncontrast CT of the abdomen pelvis.  TRANSABDOMINAL AND TRANSVAGINAL ULTRASOUND OF PELVIS Technique:  Both transabdominal and transvaginal ultrasound examinations of the pelvis were performed. Transabdominal technique was performed for global imaging of the pelvis including uterus, ovaries, adnexal regions, and pelvic cul-de-sac.  Comparison: CT abdomen pelvis without contrast 08/28/2011.  Pelvic ultrasound 03/09/2011   It was necessary to proceed with endovaginal exam following the transabdominal exam to visualize the ovaries and adnexa.  Findings:  Uterus: Surgically absent.  Endometrium: Surgically absent.  Right ovary:  Right ovary measures 3.7 x 2.2 x 1.9 cm and has some simple appearing follicles, or potentially resolving cysts within it.  Appears within normal limits for [name] 41 year old patient. Color Doppler flow is identified to the right ovary.  Left ovary: Normal appearance/no adnexal mass.  Measures 3.3 x 1.9 x 2.1 cm and contains [name] few small follicles. Color Doppler flow is identified to the left ovary.  Other findings: Trace amount of free pelvic fluid.  IMPRESSION:  1.  NoBoth ovaries are within normal limits for [name] 41 year old patient.  No suspicious findings are seen on ultrasound. 2.   Hysterectomy.  Original Report Authenticated By: Britta Mccreedy, M.D.   US Pelvis Complete  08/29/2011  *RADIOLOGY REPORT*  Clinical Data: 3.6 cm right adnexal lesion seen on noncontrast CT of the  abdomen pelvis.  TRANSABDOMINAL AND TRANSVAGINAL ULTRASOUND OF PELVIS Technique:  Both transabdominal and transvaginal ultrasound examinations of the pelvis were performed. Transabdominal technique was performed for global imaging of the pelvis including uterus, ovaries, adnexal regions, and pelvic cul-de-sac.  Comparison: CT abdomen pelvis without contrast 08/28/2011.  Pelvic ultrasound 03/09/2011   It was necessary to proceed with endovaginal exam following the transabdominal exam to visualize the ovaries and adnexa.  Findings:  Uterus: Surgically absent.  Endometrium: Surgically absent.  Right ovary:  Right ovary measures 3.7 x 2.2 x 1.9 cm and has some simple appearing follicles, or potentially resolving cysts within it.  Appears within normal limits for [name] 40 year old patient. Color Doppler flow is identified to the right ovary.  Left ovary: Normal appearance/no adnexal mass.  Measures 3.3 x 1.9 x 2.1 cm and contains [name] few small follicles. Color Doppler flow is identified to the left ovary.  Other findings: Trace amount of free pelvic fluid.  IMPRESSION:  1.  NoBoth ovaries are within normal limits for [name] 41 year old patient.  No suspicious findings are seen on ultrasound. 2.  Hysterectomy.  Original Report Authenticated By: Britta Mccreedy, M.D.   Dg Chest Port 1 View  08/30/2011  *RADIOLOGY REPORT*  Clinical Data: Chest pain and dyspnea  PORTABLE CHEST - 1 VIEW  Comparison: 08/29/2011  Findings: The heart size is within normal limits for portable technique.  Diffuse bilateral opacities, most prominent in the right upper lung and left lung base medially are suspicious for bilateral airspace disease.  The pulmonary vascularity is prominent, but may be accentuated by low lung volumes.  No visible pleural effusion.  No  acute bony abnormality identified.  IMPRESSION: Bilateral airspace disease.  This could reflect multifocal pneumonia, aspiration, or less likely pulmonary edema.  Original Report Authenticated By: Britta Mccreedy, M.D.   Dg Chest Port 1 View  08/30/2011  *RADIOLOGY REPORT*  Clinical Data: Mid chest pain and fever.  PORTABLE CHEST - 1 VIEW  Comparison: 04/05/2011  Findings: Portable view of the chest demonstrates low lung volumes. There is vascular crowding in the hilar regions.  Focal density at the right lung base may represent atelectasis.  Trachea is midline. Heart size is accentuated by the technique and low lung volumes. Previous surgery in the lower cervical spine.  There are retrocardiac densities that could represent atelectasis.  IMPRESSION: Low lung volumes with bibasilar densities.  Findings may be related to atelectasis.  Lung bases could be better evaluated with a PA and lateral chest examination.  Original Report Authenticated By: Richarda Overlie, M.D.    Review of Systems  HENT: Negative.   Eyes: Negative.   Respiratory: Negative.   Cardiovascular: Positive for chest pain.  Gastrointestinal: Positive for constipation.  Genitourinary: Negative.   Musculoskeletal: Positive for back pain.  Skin: Negative.   Neurological: Positive for tingling, sensory change, focal weakness and weakness.  Endo/Heme/Allergies: Negative.   Psychiatric/Behavioral: Negative.    Blood pressure 113/50, pulse 139, temperature 98.9 F (37.2 C), temperature source Oral, resp. rate 20, height 5\' 4"  (1.626 m), weight 107.8 kg (237 lb 10.5 oz), last menstrual period 02/27/2011, SpO2 93.00%. Physical Exam  Constitutional: She appears well-developed.  HENT:  Head: Normocephalic.  Eyes: EOM are normal. Pupils are equal, round, and reactive to light.  Neck: Normal range of motion. Neck supple.  Cardiovascular: Normal rate and normal heart sounds.        Normal dorsalis pedal pulses in lowers.  Respiratory: No  respiratory distress.  GI: Soft. There  is no tenderness.  Genitourinary:       Rectal exam shows that she can contract on my gloved-finger and she has normal perirectal sensation  Musculoskeletal:       Normal motor function in lowers.  Skin: Skin is warm.       Very bizarre and inconsistent sensory exam response in lowers to light touch and pin prick.    Assessment/Plan: MRI shows HNP and will schedule for surgery.  Brooke [name] [name] 08/30/2011, 12:47 PM

## 2011-08-30 NOTE — Progress Notes (Addendum)
Patient ID: [name]  female  EAV:[id number]    DOB: 09/15/1970    DOA: 08/28/2011  PCP: Alva Garnet., MD, MD  Subjective: Complaining of chest pain, SOB today, groggy, EKG stat negative, cardiac enzyme x1 neg,  -stat CXR and D-Dimer ordered Also c/o urinary incontinence, now foley placed, lower back pain, bilateral lower extremity numbness and tingling,  lower extremely weakness  Objective: Weight change:   Intake/Output Summary (Last 24 hours) at 08/30/11 1128 Last data filed at 08/30/11 0900  Gross per 24 hour  Intake      0 ml  Output   1525 ml  Net  -1525 ml   Blood pressure 113/50, pulse 139, temperature 98.9 F (37.2 C), temperature source Oral, resp. rate 20, height 5\' 4"  (1.626 m), weight 107.8 kg (237 lb 10.5 oz), last menstrual period 02/27/2011, SpO2 93.00%.  Physical Exam: General: Alert and awake, oriented x3, not in any acute distress. HEENT: anicteric sclera, pupils reactive to light and accommodation, EOMI CVS: S1-S2 clear, no murmur rubs or gallops Chest: clear to auscultation bilaterally, no wheezing, rales or rhonchi Abdomen: soft, diffusely tender, nondistended, normal bowel sounds, no organomegaly Extremities: no cyanosis, clubbing or edema noted bilaterally   Lab Results: Basic Metabolic Panel:  Lab 08/30/11 7829 08/29/11 0415  NA 131* 135  K 3.9 3.4*  CL 99 104  CO2 20 23  GLUCOSE 83 97  BUN 5* 8  CREATININE 0.66 0.70  CALCIUM 8.1* 8.0*  MG -- --  PHOS -- --   Liver Function Tests:  Lab 08/29/11 0415 08/28/11 1055  AST 11 17  ALT 8 13  ALKPHOS 63 82  BILITOT 0.6 0.4  PROT 6.6 8.9*  ALBUMIN 2.8* 4.1    Lab 08/28/11 1055  LIPASE 16  AMYLASE --   CBC:  Lab 08/29/11 2328 08/29/11 0415  WBC 11.1* 10.7*  NEUTROABS 8.8* --  HGB 10.9* 10.8*  HCT 32.7* 33.4*  MCV 88.4 88.8  PLT 183 199     Studies/Results: Ct Abdomen Pelvis Wo Contrast  08/28/2011  **ADDENDUM** CREATED: 08/28/2011 17:06:10  Original report read by Dr.  Constance Goltz.  Addendum by Dr. Purcell Mouton. Review of the study was requested by the emergency department at this patient who reportedly underwent total hysterectomy on 06/29/2011. On review of the pathology from that surgical procedure, there is no mention of ovarian tissue in the specimen. Therefore, the right adnexal finding could be related to an ovarian remnant. Alternately, this could reflect a postsurgical finding.  As stated, pelvic ultrasound may be helpful for further evaluation as clinically warranted.  **END ADDENDUM** SIGNED BY: Gerrianne Scale, M.D.   08/28/2011  *RADIOLOGY REPORT*  Clinical Data: Central lower abdominal pain.  CT ABDOMEN AND PELVIS WITHOUT CONTRAST  Technique:  Multidetector CT imaging of the abdomen and pelvis was performed following the standard protocol without intravenous contrast.  Comparison: None.  Findings: Within the right aspect of the pelvis, there is a complex slightly dense 3.4 x 3.6 x 3.3 cm lesion which may represent a complex ovarian process possibly a hemorrhagic cyst.  Sonogram would prove helpful for further delineation.  There is a minimal amount of adjacent fluid which extends along the sigmoid colon. This may be related to the ovarian process rather than primary sigmoid colitis.  The cecum is displaced medially and superiorly.  No inflammation surrounds the appendix.  Scattered top normal size loops of small bowel without obstruction noted.  Evaluation of solid abdominal viscera is limited by lack  of IV contrast.  Taking this limitation into account no focal hepatic, splenic, pancreatic, renal or adrenal lesion.  No calcified gallstones.  No free intraperitoneal air.  Minimal atelectatic changes lung bases.  No abdominal aortic aneurysm.  Small hiatal hernia.  Degenerative changes lower lumbar spine.  IMPRESSION: Within the right aspect of the pelvis, there is a complex slightly dense 3.4 x 3.6 x 3.3 cm lesion which may represent a complex ovarian process possibly a  hemorrhagic cyst.  Sonogram would prove helpful for further delineation.  There is a minimal amount of adjacent fluid which extends along the sigmoid colon.  This may be related to the ovarian process rather than primary sigmoid colitis.  The cecum is displaced medially and superiorly.  No inflammation surrounds the appendix.  Scattered top normal size loops of small bowel without obstruction noted.  Original Report Authenticated By: Gerrianne Scale, M.D.   Dg Abd Acute W/chest  08/28/2011  *RADIOLOGY REPORT*  Clinical Data: Abdominal pain, shortness of breath  ACUTE ABDOMEN SERIES (ABDOMEN 2 VIEW & CHEST 1 VIEW)  Comparison: Chest x-ray of 04/05/2011 and lumbar spine film of 07/09/2011  Findings: The lungs are poorly aerated and there is compression of markings at the lung bases.  No definite active process is seen. Heart size is normal.  A lower anterior cervical spine fusion plate is present.  Supine and left lateral decubitus films of the abdomen show both large and small bowel gas to be present.  No definite bowel obstruction is seen.  No free intraperitoneal air is noted.  No opaque calculi are seen.  IMPRESSION:  1.  Poor inspiration.  No active process. 2.  No evidence of bowel obstruction.  Both large and small bowel gas is present.  No free air.  Original Report Authenticated By: Juline Patch, M.D.    Medications: Scheduled Meds:    . docusate sodium  100 mg Oral BID  . gi cocktail  30 mL Oral Once  . LORazepam      . LORazepam  1 mg Intravenous Once   Continuous Infusions:    . sodium chloride 0.9 % 1,000 mL with potassium chloride 20 mEq infusion 75 mL/hr at 08/30/11 0832  . DISCONTD: sodium chloride     MRI lumbar spine reviewed done  08/29/2011 IMPRESSION:  1. Central disc extrusion at L5-S1 could encroach on either S1  nerve root.  2. No other significant disc space findings. There is mild disc  bulging at L3-L4 and L4-L5.  3. The distal thoracic cord and conus medullaris  appear normal.  4. Probable right adnexal hematoma as demonstrated on CT  yesterday. If the patient still has a right ovary, this could  reflect a hemorrhagic cyst. If the ovaries were removed at  hysterectomy, this could reflect a postoperative finding.    Assessment/Plan: Principal Problem: Chest pain and dyspnea/hypoxia: - No EKG  Changes, cardiac enzymes neg, Elevated D Dimer, so ordered stat VQ scan, Doppler US of LE - Patient's mother reports that patient had ?DVT last year and was treated to warfarin taper (??) for a week.  - CXR also suggests pulm edema vs multifocal PNA, so started on IV avelox, follow cultures     *Abdominal pain, acute, generalized: Unclear etiology  -CT abdomen and pelvis done this morning shows no acute intra-abdominal pathology however has complex slightly dense 3.4x3.6x3.3 lesion which may represent a complex ovarian process possibly a hemorrhagic cyst. - Pelvic and transvaginal ultrasound also negative for any  ovarian cyst/torsion   Bilateral lower extremity weakness with urine continence: Patient is a vague historian however her mother reports that she had a fall a month ago.  - UA and culture negative for UTI but + for ketones - MRI lumbar spine showed central disc extrusion at L5-S1 could encroach on either S1 nerve root however per Nsx, Dr Venetia Maxon, no neurosurgical intervention.  - Patient's family frustrated with her symptoms and requested Dr Shon Baton (Ortho-spine who had operated on her C Spine previously), will consult per family's request.  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 [facility] 08/30/2011, 11:28 AM Pager: [phone]   Addendum:  Discussed in detail with Dr. Darrelyn Hillock (Orthopedics) who will review films and discuss with Dr Shon Baton for further recommendations.   Finlee Milo M.D. [facility] 08/30/2011, 12:05 PM  Pager: [phone]

## 2011-08-30 NOTE — Progress Notes (Signed)
Pt received ativan, sublingual nitro 3 times and a GI cocktail after complaining of chest pain/ SOB/ chest tightness. Pt EKG showed sinus tachycardia. After blood drawn from foot by lab,  Results showed d-dimer elevated. She will be getting a VQ scan this afternoon to rule out PE. IV nurse called to insert a PICC line due to pt needing blood work frequently and being a difficult stick. MD came to pt bedside and is ordering more procedures to find out what is going on. Pt resting comfortably in bed and is stable.

## 2011-08-30 NOTE — Progress Notes (Signed)
Peripherally Inserted Central Catheter/Midline Placement  The IV Nurse has discussed with the patient and/or persons authorized to consent for the patient, the purpose of this procedure and the potential benefits and risks involved with this procedure.  The benefits include less needle sticks, lab draws from the catheter and patient may be discharged home with the catheter.  Risks include, but not limited to, infection, bleeding, blood clot (thrombus formation), and puncture of an artery; nerve damage and irregular heat beat.  Alternatives to this procedure were also discussed.  PICC/Midline Placement Documentation  PICC / Midline Double Lumen 08/30/11 PICC Right Brachial (Active)       Anwar Sakata, Lajean Manes 08/30/2011, 12:36 PM

## 2011-08-31 ENCOUNTER — Inpatient Hospital Stay (HOSPITAL_COMMUNITY): Payer: Medicaid Other | Admitting: *Deleted

## 2011-08-31 ENCOUNTER — Inpatient Hospital Stay (HOSPITAL_COMMUNITY): Payer: Medicaid Other

## 2011-08-31 ENCOUNTER — Encounter (HOSPITAL_COMMUNITY)
Admission: EM | Disposition: A | Discharge status: Rehabilitation Facility | Payer: Self-pay | Source: Home / Self Care | Attending: Internal Medicine

## 2011-08-31 ENCOUNTER — Encounter (HOSPITAL_COMMUNITY): Payer: Self-pay | Admitting: *Deleted

## 2011-08-31 DIAGNOSIS — R0902 Hypoxemia: Secondary | ICD-10-CM

## 2011-08-31 DIAGNOSIS — R112 Nausea with vomiting, unspecified: Secondary | ICD-10-CM

## 2011-08-31 DIAGNOSIS — R1084 Generalized abdominal pain: Secondary | ICD-10-CM

## 2011-08-31 DIAGNOSIS — M48061 Spinal stenosis, lumbar region without neurogenic claudication: Secondary | ICD-10-CM | POA: Diagnosis present

## 2011-08-31 DIAGNOSIS — M5126 Other intervertebral disc displacement, lumbar region: Secondary | ICD-10-CM | POA: Diagnosis present

## 2011-08-31 DIAGNOSIS — M545 Low back pain: Secondary | ICD-10-CM

## 2011-08-31 HISTORY — PX: LUMBAR LAMINECTOMY: SHX95

## 2011-08-31 LAB — BASIC METABOLIC PANEL
CO2: 27 mEq/L (ref 19–32)
Calcium: 8.3 mg/dL — ABNORMAL LOW (ref 8.4–10.5)
Chloride: 98 mEq/L (ref 96–112)
Creatinine, Ser: 0.76 mg/dL (ref 0.50–1.10)
Glucose, Bld: 96 mg/dL (ref 70–99)
Sodium: 135 mEq/L (ref 135–145)

## 2011-08-31 LAB — CBC
Hemoglobin: 11 g/dL — ABNORMAL LOW (ref 12.0–15.0)
MCH: 29.3 pg (ref 26.0–34.0)
MCV: 86.9 fL (ref 78.0–100.0)
RBC: 3.75 MIL/uL — ABNORMAL LOW (ref 3.87–5.11)
WBC: 8 10*3/uL (ref 4.0–10.5)

## 2011-08-31 LAB — LEGIONELLA ANTIGEN, URINE: Legionella Antigen, Urine: NEGATIVE

## 2011-08-31 LAB — PROTIME-INR
INR: 1.25 (ref 0.00–1.49)
Prothrombin Time: 16 seconds — ABNORMAL HIGH (ref 11.6–15.2)

## 2011-08-31 LAB — URINE CULTURE: Culture  Setup Time: 201306081818

## 2011-08-31 SURGERY — MICRODISCECTOMY LUMBAR LAMINECTOMY
Anesthesia: General | Site: Back | Wound class: Clean

## 2011-08-31 MED ORDER — HYDROCODONE-ACETAMINOPHEN 5-325 MG PO TABS
1.0000 | ORAL_TABLET | ORAL | Status: DC | PRN
  Administered 2011-09-01 – 2011-09-02 (×2): 2 via ORAL
  Administered 2011-09-03: 1 via ORAL
  Filled 2011-08-31 (×4): qty 1

## 2011-08-31 MED ORDER — MEPERIDINE HCL 50 MG/ML IJ SOLN: 6.2500 mg | INTRAMUSCULAR | Status: DC | PRN

## 2011-08-31 MED ORDER — LACTATED RINGERS IV SOLN: INTRAVENOUS | Status: DC

## 2011-08-31 MED ORDER — SODIUM CHLORIDE 0.9 % IR SOLN
Status: DC | PRN
  Administered 2011-08-31: 18:00:00

## 2011-08-31 MED ORDER — ONDANSETRON HCL 4 MG/2ML IJ SOLN
INTRAMUSCULAR | Status: DC | PRN
  Administered 2011-08-31 (×2): 2 mg via INTRAVENOUS

## 2011-08-31 MED ORDER — SODIUM CHLORIDE 0.9 % IV SOLN
INTRAVENOUS | Status: DC | PRN
  Administered 2011-08-31: 17:00:00 via INTRAVENOUS

## 2011-08-31 MED ORDER — POLYETHYLENE GLYCOL 3350 17 G PO PACK
17.0000 g | PACK | Freq: Every day | ORAL | Status: DC | PRN
  Filled 2011-08-31: qty 1

## 2011-08-31 MED ORDER — BISACODYL 10 MG RE SUPP: 10.0000 mg | Freq: Every day | RECTAL | Status: DC | PRN

## 2011-08-31 MED ORDER — LACTATED RINGERS IV SOLN
INTRAVENOUS | Status: DC
  Administered 2011-08-31 – 2011-09-01 (×2): via INTRAVENOUS

## 2011-08-31 MED ORDER — MENTHOL 3 MG MT LOZG
1.0000 | LOZENGE | OROMUCOSAL | Status: DC | PRN
  Filled 2011-08-31: qty 9

## 2011-08-31 MED ORDER — METHOCARBAMOL 500 MG PO TABS
500.0000 mg | ORAL_TABLET | Freq: Four times a day (QID) | ORAL | Status: DC | PRN
  Administered 2011-09-02 – 2011-09-03 (×4): 500 mg via ORAL
  Filled 2011-08-31 (×4): qty 1

## 2011-08-31 MED ORDER — METHOCARBAMOL 100 MG/ML IJ SOLN
500.0000 mg | Freq: Four times a day (QID) | INTRAVENOUS | Status: DC | PRN
  Administered 2011-08-31: 500 mg via INTRAVENOUS
  Filled 2011-08-31: qty 5

## 2011-08-31 MED ORDER — CEFAZOLIN SODIUM 1-5 GM-% IV SOLN
INTRAVENOUS | Status: DC | PRN
  Administered 2011-08-31: 1 g via INTRAVENOUS

## 2011-08-31 MED ORDER — CISATRACURIUM BESYLATE (PF) 10 MG/5ML IV SOLN
INTRAVENOUS | Status: DC | PRN
  Administered 2011-08-31: 6 mg via INTRAVENOUS
  Administered 2011-08-31: 2 mg via INTRAVENOUS

## 2011-08-31 MED ORDER — LACTATED RINGERS IV SOLN
INTRAVENOUS | Status: DC | PRN
  Administered 2011-08-31: 19:00:00 via INTRAVENOUS

## 2011-08-31 MED ORDER — BUPIVACAINE LIPOSOME 1.3 % IJ SUSP
20.0000 mL | Freq: Once | INTRAMUSCULAR | Status: DC
  Filled 2011-08-31: qty 20

## 2011-08-31 MED ORDER — OXYCODONE-ACETAMINOPHEN 5-325 MG PO TABS
1.0000 | ORAL_TABLET | ORAL | Status: DC | PRN
  Administered 2011-09-03: 2 via ORAL
  Filled 2011-08-31: qty 2

## 2011-08-31 MED ORDER — NEOSTIGMINE METHYLSULFATE 1 MG/ML IJ SOLN
INTRAMUSCULAR | Status: DC | PRN
  Administered 2011-08-31: 2 mg via INTRAVENOUS

## 2011-08-31 MED ORDER — DEXAMETHASONE SODIUM PHOSPHATE 10 MG/ML IJ SOLN
INTRAMUSCULAR | Status: DC | PRN
  Administered 2011-08-31: 10 mg via INTRAVENOUS

## 2011-08-31 MED ORDER — DIPHENHYDRAMINE HCL 50 MG/ML IJ SOLN
25.0000 mg | Freq: Once | INTRAMUSCULAR | Status: AC
  Administered 2011-08-31: 25 mg via INTRAVENOUS

## 2011-08-31 MED ORDER — ACETAMINOPHEN 325 MG PO TABS: 650.0000 mg | ORAL_TABLET | ORAL | Status: DC | PRN

## 2011-08-31 MED ORDER — THROMBIN 5000 UNITS EX SOLR
CUTANEOUS | Status: DC | PRN
  Administered 2011-08-31: 5000 [IU] via TOPICAL

## 2011-08-31 MED ORDER — DIPHENHYDRAMINE HCL 25 MG PO CAPS
25.0000 mg | ORAL_CAPSULE | Freq: Four times a day (QID) | ORAL | Status: DC | PRN
  Administered 2011-08-31 – 2011-09-02 (×7): 25 mg via ORAL
  Filled 2011-08-31 (×7): qty 1

## 2011-08-31 MED ORDER — ONDANSETRON HCL 4 MG/2ML IJ SOLN
4.0000 mg | INTRAMUSCULAR | Status: DC | PRN
  Administered 2011-09-01: 4 mg via INTRAVENOUS
  Filled 2011-08-31: qty 2

## 2011-08-31 MED ORDER — MIDAZOLAM HCL 5 MG/5ML IJ SOLN
INTRAMUSCULAR | Status: DC | PRN
  Administered 2011-08-31 (×2): 1 mg via INTRAVENOUS

## 2011-08-31 MED ORDER — PROPOFOL 10 MG/ML IV EMUL
INTRAVENOUS | Status: DC | PRN
  Administered 2011-08-31: 200 mg via INTRAVENOUS

## 2011-08-31 MED ORDER — LIDOCAINE HCL (CARDIAC) 20 MG/ML IV SOLN
INTRAVENOUS | Status: DC | PRN
  Administered 2011-08-31: 50 mg via INTRAVENOUS

## 2011-08-31 MED ORDER — PHENOL 1.4 % MT LIQD
1.0000 | OROMUCOSAL | Status: DC | PRN
  Filled 2011-08-31: qty 177

## 2011-08-31 MED ORDER — POTASSIUM CHLORIDE 10 MEQ/100ML IV SOLN
10.0000 meq | INTRAVENOUS | Status: DC
  Filled 2011-08-31 (×3): qty 100

## 2011-08-31 MED ORDER — CEFAZOLIN SODIUM 1-5 GM-% IV SOLN
1.0000 g | Freq: Three times a day (TID) | INTRAVENOUS | Status: AC
  Administered 2011-08-31 – 2011-09-01 (×3): 1 g via INTRAVENOUS
  Filled 2011-08-31 (×3): qty 50

## 2011-08-31 MED ORDER — POLYETHYLENE GLYCOL 3350 17 G PO PACK
17.0000 g | PACK | Freq: Every day | ORAL | Status: DC | PRN
  Administered 2011-08-31 – 2011-09-02 (×3): 17 g via ORAL
  Filled 2011-08-31 (×2): qty 1

## 2011-08-31 MED ORDER — PROMETHAZINE HCL 25 MG/ML IJ SOLN: 6.2500 mg | INTRAMUSCULAR | Status: DC | PRN

## 2011-08-31 MED ORDER — FENTANYL CITRATE 0.05 MG/ML IJ SOLN
INTRAMUSCULAR | Status: DC | PRN
  Administered 2011-08-31: 100 ug via INTRAVENOUS
  Administered 2011-08-31 (×5): 50 ug via INTRAVENOUS

## 2011-08-31 MED ORDER — EPHEDRINE SULFATE 50 MG/ML IJ SOLN
INTRAMUSCULAR | Status: DC | PRN
  Administered 2011-08-31: 5 mg via INTRAVENOUS

## 2011-08-31 MED ORDER — LACTATED RINGERS IV SOLN
INTRAVENOUS | Status: DC | PRN
  Administered 2011-08-31 (×2): via INTRAVENOUS

## 2011-08-31 MED ORDER — HYDROMORPHONE HCL PF 1 MG/ML IJ SOLN
0.5000 mg | INTRAMUSCULAR | Status: DC | PRN
  Administered 2011-08-31 – 2011-09-03 (×19): 1 mg via INTRAVENOUS
  Filled 2011-08-31 (×19): qty 1

## 2011-08-31 MED ORDER — HYDROMORPHONE HCL PF 1 MG/ML IJ SOLN
0.2500 mg | INTRAMUSCULAR | Status: DC | PRN
  Administered 2011-08-31 (×4): 0.5 mg via INTRAVENOUS

## 2011-08-31 MED ORDER — ACETAMINOPHEN 650 MG RE SUPP: 650.0000 mg | RECTAL | Status: DC | PRN

## 2011-08-31 MED ORDER — ACETAMINOPHEN 10 MG/ML IV SOLN
INTRAVENOUS | Status: DC | PRN
  Administered 2011-08-31: 1000 mg via INTRAVENOUS

## 2011-08-31 MED ORDER — FLEET ENEMA 7-19 GM/118ML RE ENEM: 1.0000 | ENEMA | Freq: Once | RECTAL | Status: AC | PRN

## 2011-08-31 MED ORDER — SUCCINYLCHOLINE CHLORIDE 20 MG/ML IJ SOLN
INTRAMUSCULAR | Status: DC | PRN
  Administered 2011-08-31: 100 mg via INTRAVENOUS

## 2011-08-31 MED ORDER — GLYCOPYRROLATE 0.2 MG/ML IJ SOLN
INTRAMUSCULAR | Status: DC | PRN
  Administered 2011-08-31 (×2): 0.1 mg via INTRAVENOUS

## 2011-08-31 MED ORDER — BUPIVACAINE LIPOSOME 1.3 % IJ SUSP
INTRAMUSCULAR | Status: DC | PRN
  Administered 2011-08-31: 20 mL

## 2011-08-31 SURGICAL SUPPLY — 48 items
BAG ZIPLOCK 12X15 (MISCELLANEOUS) ×2 IMPLANT
BENZOIN TINCTURE PRP APPL 2/3 (GAUZE/BANDAGES/DRESSINGS) IMPLANT
BUR SURG 4X8 MED (BURR) IMPLANT
BURR SURG 4X8 MED (BURR)
CHLORAPREP W/TINT 26ML (MISCELLANEOUS) ×2 IMPLANT
CLEANER TIP ELECTROSURG 2X2 (MISCELLANEOUS) ×2 IMPLANT
CLOTH BEACON ORANGE TIMEOUT ST (SAFETY) ×2 IMPLANT
CONT SPECI 4OZ STER CLIK (MISCELLANEOUS) ×6 IMPLANT
DECANTER SPIKE VIAL GLASS SM (MISCELLANEOUS) ×2 IMPLANT
DRAPE MICROSCOPE LEICA (MISCELLANEOUS) ×2 IMPLANT
DRAPE POUCH INSTRU U-SHP 10X18 (DRAPES) ×2 IMPLANT
DRAPE SURG 17X11 SM STRL (DRAPES) ×2 IMPLANT
DRSG ADAPTIC 3X8 NADH LF (GAUZE/BANDAGES/DRESSINGS) ×2 IMPLANT
DRSG PAD ABDOMINAL 8X10 ST (GAUZE/BANDAGES/DRESSINGS) ×4 IMPLANT
DRSG TELFA 4X5 ISLAND ADH (GAUZE/BANDAGES/DRESSINGS) IMPLANT
DURAPREP 26ML APPLICATOR (WOUND CARE) IMPLANT
ELECT REM PT RETURN 9FT ADLT (ELECTROSURGICAL) ×2
ELECTRODE REM PT RTRN 9FT ADLT (ELECTROSURGICAL) ×1 IMPLANT
GLOVE BIOGEL PI IND STRL 8 (GLOVE) ×2 IMPLANT
GLOVE BIOGEL PI INDICATOR 8 (GLOVE) ×2
GLOVE ECLIPSE 8.0 STRL XLNG CF (GLOVE) ×4 IMPLANT
GOWN PREVENTION PLUS LG XLONG (DISPOSABLE) IMPLANT
GOWN STRL REIN XL XLG (GOWN DISPOSABLE) ×6 IMPLANT
KIT BASIN OR (CUSTOM PROCEDURE TRAY) ×2 IMPLANT
KIT POSITIONING SURG ANDREWS (MISCELLANEOUS) ×2 IMPLANT
MANIFOLD NEPTUNE II (INSTRUMENTS) IMPLANT
NEEDLE SPNL 18GX3.5 QUINCKE PK (NEEDLE) ×6 IMPLANT
NS IRRIG 1000ML POUR BTL (IV SOLUTION) ×2 IMPLANT
PATTIES SURGICAL .5 X.5 (GAUZE/BANDAGES/DRESSINGS) IMPLANT
PATTIES SURGICAL .75X.75 (GAUZE/BANDAGES/DRESSINGS) IMPLANT
PATTIES SURGICAL 1X1 (DISPOSABLE) IMPLANT
POSITIONER SURGICAL ARM (MISCELLANEOUS) IMPLANT
SPONGE GAUZE 4X4 12PLY (GAUZE/BANDAGES/DRESSINGS) ×4 IMPLANT
SPONGE LAP 4X18 X RAY DECT (DISPOSABLE) ×2 IMPLANT
SPONGE SURGIFOAM ABS GEL 100 (HEMOSTASIS) ×2 IMPLANT
STRIP CLOSURE SKIN 1/2X4 (GAUZE/BANDAGES/DRESSINGS) IMPLANT
SUT VIC AB 0 CT1 27 (SUTURE)
SUT VIC AB 0 CT1 27XBRD ANTBC (SUTURE) IMPLANT
SUT VIC AB 0 UR5 27 (SUTURE) IMPLANT
SUT VIC AB 1 CT1 27 (SUTURE) ×2
SUT VIC AB 1 CT1 27XBRD ANTBC (SUTURE) ×2 IMPLANT
SUT VIC AB 2-0 CT1 27 (SUTURE)
SUT VIC AB 2-0 CT1 27XBRD (SUTURE) IMPLANT
SUT VIC AB 4-0 PS2 27 (SUTURE) IMPLANT
SUT VICRYL 0 UR6 27IN ABS (SUTURE) IMPLANT
TOWEL OR 17X26 10 PK STRL BLUE (TOWEL DISPOSABLE) ×4 IMPLANT
TRAY LAMINECTOMY (CUSTOM PROCEDURE TRAY) ×2 IMPLANT
WATER STERILE IRR 1500ML POUR (IV SOLUTION) IMPLANT

## 2011-08-31 NOTE — Anesthesia Procedure Notes (Addendum)
Performed by: Edison Pace   Procedure Name: Intubation Date/Time: 08/31/2011 5:19 PM Performed by: Edison Pace Pre-anesthesia Checklist: Patient identified, Timeout performed, Emergency Drugs available, Suction available and Patient being monitored Patient Re-evaluated:Patient Re-evaluated prior to inductionOxygen Delivery Method: Circle system utilized Preoxygenation: Pre-oxygenation with 100% oxygen Intubation Type: IV induction Ventilation: Mask ventilation without difficulty Laryngoscope Size: Mac and 3 Grade View: Grade I Tube type: Oral Tube size: 7.5 mm Number of attempts: 1 Airway Equipment and Method: Stylet Placement Confirmation: ETT inserted through vocal cords under direct vision,  positive ETCO2 and breath sounds checked- equal and bilateral Secured at: 21 cm Dental Injury: Teeth and Oropharynx as per pre-operative assessment

## 2011-08-31 NOTE — H&P (View-Only) (Signed)
Patient ID: [name]  female  YNW:[id number]    DOB: 1970/05/18    DOA: 08/28/2011  PCP: Alva Garnet., MD, MD  Subjective: Chest pain resolved, awaiting surgery today  Objective: Weight change:   Intake/Output Summary (Last 24 hours) at 08/31/11 1431 Last data filed at 08/31/11 0845  Gross per 24 hour  Intake    240 ml  Output   2700 ml  Net  -2460 ml   Blood pressure 109/76, pulse 102, temperature 98.5 F (36.9 C), temperature source Oral, resp. rate 18, height 5\' 4"  (1.626 m), weight 107.8 kg (237 lb 10.5 oz), last menstrual period 02/27/2011, SpO2 94.00%.  Physical Exam: General: Alert and awake, oriented x3, not in any acute distress. HEENT: anicteric sclera, pupils reactive to light and accommodation, EOMI CVS: S1-S2 clear, no murmur rubs or gallops Chest: clear to auscultation bilaterally, no wheezing, rales or rhonchi Abdomen: soft, non- tender, nondistended, normal bowel sounds, no organomegaly Extremities: no cyanosis, clubbing or edema noted bilaterally   Lab Results: Basic Metabolic Panel:  Lab 08/31/11 6578 08/30/11 0830  NA 135 131*  K 3.4* 3.9  CL 98 99  CO2 27 20  GLUCOSE 96 83  BUN 6 5*  CREATININE 0.76 0.66  CALCIUM 8.3* 8.1*  MG -- --  PHOS -- --   Liver Function Tests:  Lab 08/29/11 0415 08/28/11 1055  AST 11 17  ALT 8 13  ALKPHOS 63 82  BILITOT 0.6 0.4  PROT 6.6 8.9*  ALBUMIN 2.8* 4.1    Lab 08/28/11 1055  LIPASE 16  AMYLASE --   CBC:  Lab 08/31/11 0545 08/30/11 1253 08/29/11 2328  WBC 8.0 12.7* --  NEUTROABS -- -- 8.8*  HGB 11.0* 11.4* --  HCT 32.6* 33.7* --  MCV 86.9 87.5 --  PLT 186 201 --     Studies/Results: Ct Abdomen Pelvis Wo Contrast  08/28/2011  **ADDENDUM** CREATED: 08/28/2011 17:06:10  Original report read by Dr. Constance Goltz.  Addendum by Dr. Purcell Mouton. Review of the study was requested by the emergency department at this patient who reportedly underwent total hysterectomy on 06/29/2011. On review of the pathology  from that surgical procedure, there is no mention of ovarian tissue in the specimen. Therefore, the right adnexal finding could be related to an ovarian remnant. Alternately, this could reflect a postsurgical finding.  As stated, pelvic ultrasound may be helpful for further evaluation as clinically warranted.  **END ADDENDUM** SIGNED BY: Gerrianne Scale, M.D.   08/28/2011  *RADIOLOGY REPORT*  Clinical Data: Central lower abdominal pain.  CT ABDOMEN AND PELVIS WITHOUT CONTRAST  Technique:  Multidetector CT imaging of the abdomen and pelvis was performed following the standard protocol without intravenous contrast.  Comparison: None.  Findings: Within the right aspect of the pelvis, there is a complex slightly dense 3.4 x 3.6 x 3.3 cm lesion which may represent a complex ovarian process possibly a hemorrhagic cyst.  Sonogram would prove helpful for further delineation.  There is a minimal amount of adjacent fluid which extends along the sigmoid colon. This may be related to the ovarian process rather than primary sigmoid colitis.  The cecum is displaced medially and superiorly.  No inflammation surrounds the appendix.  Scattered top normal size loops of small bowel without obstruction noted.  Evaluation of solid abdominal viscera is limited by lack of IV contrast.  Taking this limitation into account no focal hepatic, splenic, pancreatic, renal or adrenal lesion.  No calcified gallstones.  No free intraperitoneal air.  Minimal atelectatic changes lung bases.  No abdominal aortic aneurysm.  Small hiatal hernia.  Degenerative changes lower lumbar spine.  IMPRESSION: Within the right aspect of the pelvis, there is a complex slightly dense 3.4 x 3.6 x 3.3 cm lesion which may represent a complex ovarian process possibly a hemorrhagic cyst.  Sonogram would prove helpful for further delineation.  There is a minimal amount of adjacent fluid which extends along the sigmoid colon.  This may be related to the ovarian process  rather than primary sigmoid colitis.  The cecum is displaced medially and superiorly.  No inflammation surrounds the appendix.  Scattered top normal size loops of small bowel without obstruction noted.  Original Report Authenticated By: Gerrianne Scale, M.D.   Dg Abd Acute W/chest  08/28/2011  *RADIOLOGY REPORT*  Clinical Data: Abdominal pain, shortness of breath  ACUTE ABDOMEN SERIES (ABDOMEN 2 VIEW & CHEST 1 VIEW)  Comparison: Chest x-ray of 04/05/2011 and lumbar spine film of 07/09/2011  Findings: The lungs are poorly aerated and there is compression of markings at the lung bases.  No definite active process is seen. Heart size is normal.  A lower anterior cervical spine fusion plate is present.  Supine and left lateral decubitus films of the abdomen show both large and small bowel gas to be present.  No definite bowel obstruction is seen.  No free intraperitoneal air is noted.  No opaque calculi are seen.  IMPRESSION:  1.  Poor inspiration.  No active process. 2.  No evidence of bowel obstruction.  Both large and small bowel gas is present.  No free air.  Original Report Authenticated By: Juline Patch, M.D.    Medications: Scheduled Meds:    . docusate sodium  100 mg Oral BID  . moxifloxacin  400 mg Intravenous Q24H  . sodium chloride  10-40 mL Intracatheter Q12H   Continuous Infusions:   MRI lumbar spine reviewed done  08/29/2011 IMPRESSION:  1. Central disc extrusion at L5-S1 could encroach on either S1  nerve root.  2. No other significant disc space findings. There is mild disc  bulging at L3-L4 and L4-L5.  3. The distal thoracic cord and conus medullaris appear normal.  4. Probable right adnexal hematoma as demonstrated on CT  yesterday. If the patient still has a right ovary, this could  reflect a hemorrhagic cyst. If the ovaries were removed at  hysterectomy, this could reflect a postoperative finding.    Assessment/Plan: Principal Problem:  Chest pain and  dyspnea/hypoxia: Resolved ? Acute anxiety attack versus aspiration - No EKG  Changes, cardiac enzymes neg, Elevated D Dimer, so V/Q scan was done which was negative, Doppler US of LE negative for any DVT - CXR done yesterday suggested pulm edema vs multifocal PNA, so started on IV avelox, follow cultures     *Abdominal pain, acute, generalized: Unclear etiology, improving  -CT abdomen and pelvis showed no acute intra-abdominal pathology however has complex slightly dense 3.4x3.6x3.3 lesion which may represent a complex ovarian process possibly a hemorrhagic cyst. - Pelvic and transvaginal ultrasound also negative for any ovarian cyst/torsion   Bilateral lower extremity weakness with urine continence: UA negative for any UTI - MRI lumbar spine showed central disc extrusion at L5-S1 could encroach on either S1 nerve root however per Nsx, Dr Venetia Maxon, no neurosurgical intervention.  - Highly appreciate orthopedics, Dr. Darrelyn Hillock recommendations, patient scheduled for OR today.   Questionable DVT of right leg (deep venous thrombosis) in 01/2011: Not on any anticoagulation,  please see #1, Doppler ultrasounds done yesterday are negative for any DVT   Nausea and vomiting: Continue Zofran and Phenergan when necessary   S/P hysterectomy 4/13  DVT Prophylaxis: SCDs  Code Status: full code  Disposition: Not medically ready   LOS: 3 days   Owain Eckerman M.D. [facility] 08/31/2011, 2:31 PM Pager: [phone]   Addendum:  Discussed in detail with Dr. Darrelyn Hillock (Orthopedics) who will review films and discuss with Dr Shon Baton for further recommendations.   Vista Sawatzky M.D. [facility] 08/31/2011, 2:31 PM  Pager: [phone]

## 2011-08-31 NOTE — Preoperative (Signed)
Beta Blockers   Reason not to administer Beta Blockers:Not Applicable 

## 2011-08-31 NOTE — Anesthesia Preprocedure Evaluation (Addendum)
Anesthesia Evaluation  Patient identified by MRN, date of birth, ID band Patient awake    Reviewed: Allergy & Precautions, H&P , NPO status , Patient's Chart, lab work & pertinent test results  Airway Mallampati: I TM Distance: >3 FB Neck ROM: Full    Dental No notable dental hx. (+) Teeth Intact   Pulmonary neg pulmonary ROS,    Pulmonary exam normal       Cardiovascular negative cardio ROS  Rhythm:Regular Rate:Normal  Hx/o DVT 11/12 took Coumadin, but then told she didn't have a DVT ????   Neuro/Psych  Headaches, negative psych ROS   GI/Hepatic negative GI ROS, Neg liver ROS,   Endo/Other  Morbid obesity  Renal/GU negative Renal ROS  negative genitourinary   Musculoskeletal negative musculoskeletal ROS (+)   Abdominal (+) - obese,   Peds  Hematology negative hematology ROS (+)   Anesthesia Other Findings   Reproductive/Obstetrics negative OB ROS                          Anesthesia Physical  Anesthesia Plan  ASA: III  Anesthesia Plan: General   Post-op Pain Management:    Induction: Intravenous  Airway Management Planned: Oral ETT  Additional Equipment:   Intra-op Plan:   Post-operative Plan: Extubation in OR  Informed Consent: I have reviewed the patients History and Physical, chart, labs and discussed the procedure including the risks, benefits and alternatives for the proposed anesthesia with the patient or authorized representative who has indicated his/her understanding and acceptance.   Dental advisory given  Plan Discussed with: CRNA, Anesthesiologist and Surgeon  Anesthesia Plan Comments:         Anesthesia Quick Evaluation

## 2011-08-31 NOTE — Progress Notes (Signed)
PT Cancellation Note  Treatment cancelled today due to medical issues with patient which prohibited therapy.  Pt is scheduled for back surgery this afternoon, therefore will not initiate PT.   PLEASE REORDER PHYSICAL THERAPY AFTER SURGERY.  Thank you!  Bayard Hugger. Manson Passey, [place] [phone] 08/31/2011, 9:53 AM

## 2011-08-31 NOTE — Addendum Note (Signed)
Addendum  created 08/31/11 2010 by Phillips Grout, MD   Modules edited:Orders

## 2011-08-31 NOTE — Interval H&P Note (Signed)
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 After consideration of risks, benefits and other options for treatment, the patient has consented to  Procedure(s) (LRB): MICRODISCECTOMY LUMBAR LAMINECTOMY (N/A) as a surgical intervention .  The patients' history has been reviewed, patient examined, no change in status, stable for surgery.  I have reviewed the patients' chart and labs.  Questions were answered to the patient's satisfaction.     [name]

## 2011-08-31 NOTE — Brief Op Note (Signed)
08/28/2011 - 08/31/2011  7:32 PM  PATIENT:  [name]  41 y.o. 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  PROCEDURE:  Procedure(s) (LRB): MICRODISCECTOMY LUMBAR LAMINECTOMY ,complete ,at L-5--S-1  SURGEON:  Surgeon(s) and Role:    * Jacki Cones, MD - Primary    * Javier Docker, MD - Assisting     ASSISTANTS: Dr. Paula Libra MD   ANESTHESIA:   general  EBL:  Total I/O In: 800 [I.V.:800] Out: -   BLOOD ADMINISTERED:none  DRAINS: none   LOCAL MEDICATIONS USED:  BUPIVICAINE 20cc mixed with 10cc of Normal Saline.  SPECIMEN:  Source of Specimen:  Disc Material from L-5--S-1  DISPOSITION OF SPECIMEN:  PATHOLOGY  COUNTS:  YES  TOURNIQUET:  * No tourniquets in log *  DICTATION: .Other Dictation: Dictation Number [id number]  PLAN OF CARE: Admit to inpatient   PATIENT DISPOSITION:  PACU - hemodynamically stable.   Delay start of Pharmacological VTE agent (>24hrs) due to surgical blood loss or risk of bleeding: yes

## 2011-08-31 NOTE — Progress Notes (Signed)
Patient ID: [name]  female  MRN:[id number]    DOB: 07/16/1970    DOA: 08/28/2011  PCP: SHELTON,KIMBERLY R., MD, MD  Subjective: Chest pain resolved, awaiting surgery today  Objective: Weight change:   Intake/Output Summary (Last 24 hours) at 08/31/11 1431 Last data filed at 08/31/11 0845  Gross per 24 hour  Intake    240 ml  Output   2700 ml  Net  -2460 ml   Blood pressure 109/76, pulse 102, temperature 98.5 F (36.9 C), temperature source Oral, resp. rate 18, height 5' 4" (1.626 m), weight 107.8 kg (237 lb 10.5 oz), last menstrual period 02/27/2011, SpO2 94.00%.  Physical Exam: General: Alert and awake, oriented x3, not in any acute distress. HEENT: anicteric sclera, pupils reactive to light and accommodation, EOMI CVS: S1-S2 clear, no murmur rubs or gallops Chest: clear to auscultation bilaterally, no wheezing, rales or rhonchi Abdomen: soft, non- tender, nondistended, normal bowel sounds, no organomegaly Extremities: no cyanosis, clubbing or edema noted bilaterally   Lab Results: Basic Metabolic Panel:  Lab 08/31/11 0545 08/30/11 0830  NA 135 131*  K 3.4* 3.9  CL 98 99  CO2 27 20  GLUCOSE 96 83  BUN 6 5*  CREATININE 0.76 0.66  CALCIUM 8.3* 8.1*  MG -- --  PHOS -- --   Liver Function Tests:  Lab 08/29/11 0415 08/28/11 1055  AST 11 17  ALT 8 13  ALKPHOS 63 82  BILITOT 0.6 0.4  PROT 6.6 8.9*  ALBUMIN 2.8* 4.1    Lab 08/28/11 1055  LIPASE 16  AMYLASE --   CBC:  Lab 08/31/11 0545 08/30/11 1253 08/29/11 2328  WBC 8.0 12.7* --  NEUTROABS -- -- 8.8*  HGB 11.0* 11.4* --  HCT 32.6* 33.7* --  MCV 86.9 87.5 --  PLT 186 201 --     Studies/Results: Ct Abdomen Pelvis Wo Contrast  08/28/2011  **ADDENDUM** CREATED: 08/28/2011 17:06:10  Original report read by Dr. Olson.  Addendum by Dr. Veazey. Review of the study was requested by the emergency department at this patient who reportedly underwent total hysterectomy on 06/29/2011. On review of the pathology  from that surgical procedure, there is no mention of ovarian tissue in the specimen. Therefore, the right adnexal finding could be related to an ovarian remnant. Alternately, this could reflect a postsurgical finding.  As stated, pelvic ultrasound may be helpful for further evaluation as clinically warranted.  **END ADDENDUM** SIGNED BY: William B. Veazey, M.D.   08/28/2011  *RADIOLOGY REPORT*  Clinical Data: Central lower abdominal pain.  CT ABDOMEN AND PELVIS WITHOUT CONTRAST  Technique:  Multidetector CT imaging of the abdomen and pelvis was performed following the standard protocol without intravenous contrast.  Comparison: None.  Findings: Within the right aspect of the pelvis, there is a complex slightly dense 3.4 x 3.6 x 3.3 cm lesion which may represent a complex ovarian process possibly a hemorrhagic cyst.  Sonogram would prove helpful for further delineation.  There is a minimal amount of adjacent fluid which extends along the sigmoid colon. This may be related to the ovarian process rather than primary sigmoid colitis.  The cecum is displaced medially and superiorly.  No inflammation surrounds the appendix.  Scattered top normal size loops of small bowel without obstruction noted.  Evaluation of solid abdominal viscera is limited by lack of IV contrast.  Taking this limitation into account no focal hepatic, splenic, pancreatic, renal or adrenal lesion.  No calcified gallstones.  No free intraperitoneal air.    Minimal atelectatic changes lung bases.  No abdominal aortic aneurysm.  Small hiatal hernia.  Degenerative changes lower lumbar spine.  IMPRESSION: Within the right aspect of the pelvis, there is a complex slightly dense 3.4 x 3.6 x 3.3 cm lesion which may represent a complex ovarian process possibly a hemorrhagic cyst.  Sonogram would prove helpful for further delineation.  There is a minimal amount of adjacent fluid which extends along the sigmoid colon.  This may be related to the ovarian process  rather than primary sigmoid colitis.  The cecum is displaced medially and superiorly.  No inflammation surrounds the appendix.  Scattered top normal size loops of small bowel without obstruction noted.  Original Report Authenticated By: WILLIAM B. VEAZEY, M.D.   Dg Abd Acute W/chest  08/28/2011  *RADIOLOGY REPORT*  Clinical Data: Abdominal pain, shortness of breath  ACUTE ABDOMEN SERIES (ABDOMEN 2 VIEW & CHEST 1 VIEW)  Comparison: Chest x-ray of 04/05/2011 and lumbar spine film of 07/09/2011  Findings: The lungs are poorly aerated and there is compression of markings at the lung bases.  No definite active process is seen. Heart size is normal.  A lower anterior cervical spine fusion plate is present.  Supine and left lateral decubitus films of the abdomen show both large and small bowel gas to be present.  No definite bowel obstruction is seen.  No free intraperitoneal air is noted.  No opaque calculi are seen.  IMPRESSION:  1.  Poor inspiration.  No active process. 2.  No evidence of bowel obstruction.  Both large and small bowel gas is present.  No free air.  Original Report Authenticated By: PAUL D. BARRY, M.D.    Medications: Scheduled Meds:    . docusate sodium  100 mg Oral BID  . moxifloxacin  400 mg Intravenous Q24H  . sodium chloride  10-40 mL Intracatheter Q12H   Continuous Infusions:   MRI lumbar spine reviewed done  08/29/2011 IMPRESSION:  1. Central disc extrusion at L5-S1 could encroach on either S1  nerve root.  2. No other significant disc space findings. There is mild disc  bulging at L3-L4 and L4-L5.  3. The distal thoracic cord and conus medullaris appear normal.  4. Probable right adnexal hematoma as demonstrated on CT  yesterday. If the patient still has a right ovary, this could  reflect a hemorrhagic cyst. If the ovaries were removed at  hysterectomy, this could reflect a postoperative finding.    Assessment/Plan: Principal Problem:  Chest pain and  dyspnea/hypoxia: Resolved ? Acute anxiety attack versus aspiration - No EKG  Changes, cardiac enzymes neg, Elevated D Dimer, so V/Q scan was done which was negative, Doppler US of LE negative for any DVT - CXR done yesterday suggested pulm edema vs multifocal PNA, so started on IV avelox, follow cultures     *Abdominal pain, acute, generalized: Unclear etiology, improving  -CT abdomen and pelvis showed no acute intra-abdominal pathology however has complex slightly dense 3.4x3.6x3.3 lesion which may represent a complex ovarian process possibly a hemorrhagic cyst. - Pelvic and transvaginal ultrasound also negative for any ovarian cyst/torsion   Bilateral lower extremity weakness with urine continence: UA negative for any UTI - MRI lumbar spine showed central disc extrusion at L5-S1 could encroach on either S1 nerve root however per Nsx, Dr Stern, no neurosurgical intervention.  - Highly appreciate orthopedics, Dr. Gioffre recommendations, patient scheduled for OR today.   Questionable DVT of right leg (deep venous thrombosis) in 01/2011: Not on any anticoagulation,   please see #1, Doppler ultrasounds done yesterday are negative for any DVT   Nausea and vomiting: Continue Zofran and Phenergan when necessary   S/P hysterectomy 4/13  DVT Prophylaxis: SCDs  Code Status: full code  Disposition: Not medically ready   LOS: 3 days   [name] M.D. [facility] 08/31/2011, 2:31 PM Pager: [phone]   Addendum:  Discussed in detail with Dr. Gioffre (Orthopedics) who will review films and discuss with Dr Brooks for further recommendations.   [name] M.D. [facility] 08/31/2011, 2:31 PM  Pager: [phone]          

## 2011-08-31 NOTE — Anesthesia Postprocedure Evaluation (Signed)
  Anesthesia Post-op Note  Patient: [name]  Procedure(s) Performed: Procedure(s) (LRB): MICRODISCECTOMY LUMBAR LAMINECTOMY (N/A)  Patient Location: PACU  Anesthesia Type: General  Level of Consciousness: awake and alert   Airway and Oxygen Therapy: Patient Spontanous Breathing  Post-op Pain: mild  Post-op Assessment: Post-op Vital signs reviewed, Patient's Cardiovascular Status Stable, Respiratory Function Stable, Patent Airway and No signs of Nausea or vomiting  Post-op Vital Signs: stable  Complications: No apparent anesthesia complications

## 2011-08-31 NOTE — Transfer of Care (Signed)
Immediate Anesthesia Transfer of Care Note  Patient: [name]  Procedure(s) Performed: Procedure(s) (LRB): MICRODISCECTOMY LUMBAR LAMINECTOMY (N/A)  Patient Location: PACU  Anesthesia Type: General  Level of Consciousness: awake, oriented, patient cooperative, lethargic and responds to stimulation  Airway & Oxygen Therapy: Patient Spontanous Breathing and Patient connected to face mask oxygen  Post-op Assessment: Report given to PACU RN, Post -op Vital signs reviewed and stable and Patient moving all extremities  Post vital signs: Reviewed and stable  Complications: No apparent anesthesia complications

## 2011-09-01 ENCOUNTER — Telehealth: Payer: Self-pay | Admitting: *Deleted

## 2011-09-01 ENCOUNTER — Encounter (HOSPITAL_COMMUNITY): Payer: Self-pay | Admitting: Orthopedic Surgery

## 2011-09-01 DIAGNOSIS — R0902 Hypoxemia: Secondary | ICD-10-CM

## 2011-09-01 DIAGNOSIS — R5381 Other malaise: Secondary | ICD-10-CM

## 2011-09-01 DIAGNOSIS — IMO0002 Reserved for concepts with insufficient information to code with codable children: Secondary | ICD-10-CM

## 2011-09-01 DIAGNOSIS — M545 Low back pain: Secondary | ICD-10-CM

## 2011-09-01 DIAGNOSIS — M961 Postlaminectomy syndrome, not elsewhere classified: Secondary | ICD-10-CM

## 2011-09-01 DIAGNOSIS — R1084 Generalized abdominal pain: Secondary | ICD-10-CM

## 2011-09-01 DIAGNOSIS — R112 Nausea with vomiting, unspecified: Secondary | ICD-10-CM

## 2011-09-01 LAB — URINE CULTURE: Colony Count: >=100000

## 2011-09-01 MED ORDER — SODIUM CHLORIDE 0.9 % IV SOLN
1.0000 g | Freq: Four times a day (QID) | INTRAVENOUS | Status: DC
  Administered 2011-09-01 – 2011-09-03 (×8): 1 g via INTRAVENOUS
  Filled 2011-09-01 (×10): qty 1000

## 2011-09-01 NOTE — Telephone Encounter (Signed)
Pt left message stating that she was admitted to [facility] on 6/7 because of loss of sensation in her lower extremities.  She had surgery for a herniated disk and then was found to be incontinent of urine. She states she was asked to contact her Gyn MD to see if the incontinence is related to her hysterectomy.  I returned pt call and we discussed her situation. I told her that there is no indication that the incontinence has anything to do with her robotic hysterectomy. I further stated that she was not having any problems with urination @ the time of her post op visit with Dr. Marice Potter. Pt agrees and states that she did not have any problem with urination until she was in the hospital. I informed pt that her current doctor can order a Gyn consult if it is needed. I also stated that her doctor has access to all of Dr. Ellin Saba notes in the electronic chart. Pt voiced understanding.

## 2011-09-01 NOTE — Progress Notes (Signed)
ANTIBIOTIC CONSULT NOTE - INITIAL  Pharmacy Consult for ampicillin Indication: enterococcus UTI  Allergies  Allergen Reactions  . Iodine Hives  . Shrimp (Shellfish Allergy) Hives    Patient Measurements: Height: 5\' 4"  (162.6 cm) Weight: 237 lb 10.5 oz (107.8 kg) IBW/kg (Calculated) : 54.7   Vital Signs: Temp: 98.4 F (36.9 C) (06/11 0551) Temp src: Oral (06/11 0551) BP: 111/70 mmHg (06/11 0551) Pulse Rate: 90  (06/11 0551) Intake/Output from previous day: 06/10 0701 - 06/11 0700 In: 2600 [I.V.:2550; IV Piggyback:50] Out: 475 [Urine:375; Blood:100] Intake/Output from this shift:    Labs:  Basename 08/31/11 0545 08/30/11 1253 08/30/11 0830 08/29/11 2328  WBC 8.0 12.7* -- 11.1*  HGB 11.0* 11.4* -- 10.9*  PLT 186 201 -- 183  LABCREA -- -- -- --  CREATININE 0.76 -- 0.66 --   Estimated Creatinine Clearance: 113.1 ml/min (by C-G formula based on Cr of 0.76). No results found for this basename: VANCOTROUGH:2,VANCOPEAK:2,VANCORANDOM:2,GENTTROUGH:2,GENTPEAK:2,GENTRANDOM:2,TOBRATROUGH:2,TOBRAPEAK:2,TOBRARND:2,AMIKACINPEAK:2,AMIKACINTROU:2,AMIKACIN:2, in the last 72 hours   Microbiology: Recent Results (from the past 720 hour(s))  WET PREP, GENITAL     Status: Abnormal   Collection Time   08/06/11  4:46 PM      Component Value Range Status Comment   Yeast Wet Prep HPF POC NONE SEEN  NONE SEEN  Final    Trich, Wet Prep NONE SEEN  NONE SEEN  Final    Clue Cells Wet Prep HPF POC FEW (*) NONE SEEN  Final    WBC, Wet Prep HPF POC FEW  NONE SEEN  Final   URINE CULTURE     Status: Normal   Collection Time   08/29/11  1:49 PM      Component Value Range Status Comment   Specimen Description URINE, CLEAN CATCH   Final    Special Requests NONE   Final    Culture  Setup Time [id number]   Final    Colony Count >=100,000 COLONIES/ML   Final    Culture ENTEROCOCCUS SPECIES   Final    Report Status 08/31/2011 FINAL   Final    Organism ID, Bacteria ENTEROCOCCUS SPECIES   Final     CULTURE, BLOOD (ROUTINE X 2)     Status: Normal (Preliminary result)   Collection Time   08/29/11  9:12 PM      Component Value Range Status Comment   Specimen Description BLOOD RIGHT WRIST   Final    Special Requests BOTTLES DRAWN AEROBIC ONLY  7CC ART STICK   Final    Culture  Setup Time [id number]   Final    Culture     Final    Value:        BLOOD CULTURE RECEIVED NO GROWTH TO DATE CULTURE WILL BE HELD FOR 5 DAYS BEFORE ISSUING A FINAL NEGATIVE REPORT   Report Status PENDING   Incomplete   URINE CULTURE     Status: Normal   Collection Time   08/29/11 10:38 PM      Component Value Range Status Comment   Specimen Description URINE, CATHETERIZED   Final    Special Requests NONE   Final    Culture  Setup Time [id number]   Final    Colony Count >=100,000 COLONIES/ML   Final    Culture ENTEROCOCCUS SPECIES   Final    Report Status 09/01/2011 FINAL   Final    Organism ID, Bacteria ENTEROCOCCUS SPECIES   Final   CULTURE, BLOOD (ROUTINE X 2)     Status: Normal (Preliminary result)  Collection Time   08/29/11 11:28 PM      Component Value Range Status Comment   Specimen Description BLOOD RIGHT WRIST   Final    Special Requests BOTTLES DRAWN AEROBIC AND ANAEROBIC  5CC ART STICK   Final    Culture  Setup Time [id number]   Final    Culture     Final    Value:        BLOOD CULTURE RECEIVED NO GROWTH TO DATE CULTURE WILL BE HELD FOR 5 DAYS BEFORE ISSUING A FINAL NEGATIVE REPORT   Report Status PENDING   Incomplete   SURGICAL PCR SCREEN     Status: Abnormal   Collection Time   08/30/11  2:01 PM      Component Value Range Status Comment   MRSA, PCR INVALID RESULTS, SPECIMEN SENT FOR CULTURE (*) NEGATIVE  Final Loreli Dollar [id number] @ 1530 BY J SCOTTON   Staphylococcus aureus INVALID RESULTS, SPECIMEN SENT FOR CULTURE (*) NEGATIVE  Final   SURGICAL PCR SCREEN     Status: Normal   Collection Time   08/30/11  3:45 PM      Component Value Range Status Comment   MRSA, PCR NEGATIVE  NEGATIVE  Final  [place] CHECK NOTED   Staphylococcus aureus NEGATIVE  NEGATIVE  Final     Medical History: Past Medical History  Diagnosis Date  . DVT (deep venous thrombosis)     ? vs lupus  . Fibroids   . Anemia   . Headache   . Difficult intravenous access     hands are usually best for blood draws and IV starts  . Complication of anesthesia     slow to wake up  . Scoliosis     Medications:  Scheduled:    .  ceFAZolin (ANCEF) IV  1 g Intravenous Q8H  . diphenhydrAMINE  25 mg Intravenous Once  . DISCONTD: bupivacaine liposome  20 mL Infiltration Once  . DISCONTD: docusate sodium  100 mg Oral BID  . DISCONTD: moxifloxacin  400 mg Intravenous Q24H  . DISCONTD: potassium chloride  10 mEq Intravenous Q1 Hr x 3  . DISCONTD: sodium chloride  10-40 mL Intracatheter Q12H   Infusions:    . lactated ringers 100 mL/hr at 09/01/11 0004  . DISCONTD: lactated ringers    . DISCONTD: lactated ringers    . DISCONTD: lactated ringers     Assessment: 41 yo female with enterococcus UTI pending sensitivities. Per discussion with Dr. Isidoro Donning, will start IV ampicillin today and start diet with plan to hopefully change to PO ampicillin tomorrow.  Goal of Therapy:  adjustment per renal function  Plan:  1. Ampicillin 1g IV q6 2. Monitor ability to tolerate PO diet and plan change to PO ampicillin tomorrow if possible   Hessie Knows, PharmD, BCPS Pager [phone] 09/01/2011 9:39 AM

## 2011-09-01 NOTE — Progress Notes (Signed)
Patient ID: [name]  female  YNW:[id number]    DOB: 1970-12-05    DOA: 08/28/2011  PCP: Alva Garnet., MD, MD  Subjective: Postop day 1, decompressive lumbar laminectomy and microdiskectomy at L5-S1, pain controlled, patient very pleasant and cooperative today. Nausea and vomiting resolving   Objective: Weight change:   Intake/Output Summary (Last 24 hours) at 09/01/11 1101 Last data filed at 09/01/11 1057  Gross per 24 hour  Intake   2840 ml  Output    675 ml  Net   2165 ml   Blood pressure 111/70, pulse 90, temperature 98.4 F (36.9 C), temperature source Oral, resp. rate 20, height 5\' 4"  (1.626 m), weight 107.8 kg (237 lb 10.5 oz), last menstrual period 02/27/2011, SpO2 93.00%.  Physical Exam: General: Alert and awake, oriented x3, not in any acute distress. HEENT: anicteric sclera, pupils reactive to light and accommodation, EOMI CVS: S1-S2 clear, no murmur rubs or gallops Chest: clear to auscultation bilaterally, no wheezing, rales or rhonchi Abdomen: soft, non- tender, nondistended, normal bowel sounds, no organomegaly Extremities: no cyanosis, clubbing or edema noted bilaterally   Lab Results: Basic Metabolic Panel:  Lab 08/31/11 6578 08/30/11 0830  NA 135 131*  K 3.4* 3.9  CL 98 99  CO2 27 20  GLUCOSE 96 83  BUN 6 5*  CREATININE 0.76 0.66  CALCIUM 8.3* 8.1*  MG -- --  PHOS -- --   Liver Function Tests:  Lab 08/29/11 0415 08/28/11 1055  AST 11 17  ALT 8 13  ALKPHOS 63 82  BILITOT 0.6 0.4  PROT 6.6 8.9*  ALBUMIN 2.8* 4.1    Lab 08/28/11 1055  LIPASE 16  AMYLASE --   CBC:  Lab 08/31/11 0545 08/30/11 1253 08/29/11 2328  WBC 8.0 12.7* --  NEUTROABS -- -- 8.8*  HGB 11.0* 11.4* --  HCT 32.6* 33.7* --  MCV 86.9 87.5 --  PLT 186 201 --     Studies/Results: Ct Abdomen Pelvis Wo Contrast  08/28/2011  **ADDENDUM** CREATED: 08/28/2011 17:06:10  Original report read by Dr. Constance Goltz.  Addendum by Dr. Purcell Mouton. Review of the study was requested by  the emergency department at this patient who reportedly underwent total hysterectomy on 06/29/2011. On review of the pathology from that surgical procedure, there is no mention of ovarian tissue in the specimen. Therefore, the right adnexal finding could be related to an ovarian remnant. Alternately, this could reflect a postsurgical finding.  As stated, pelvic ultrasound may be helpful for further evaluation as clinically warranted.  **END ADDENDUM** SIGNED BY: Gerrianne Scale, M.D.   08/28/2011  *RADIOLOGY REPORT*  Clinical Data: Central lower abdominal pain.  CT ABDOMEN AND PELVIS WITHOUT CONTRAST  Technique:  Multidetector CT imaging of the abdomen and pelvis was performed following the standard protocol without intravenous contrast.  Comparison: None.  Findings: Within the right aspect of the pelvis, there is a complex slightly dense 3.4 x 3.6 x 3.3 cm lesion which may represent a complex ovarian process possibly a hemorrhagic cyst.  Sonogram would prove helpful for further delineation.  There is a minimal amount of adjacent fluid which extends along the sigmoid colon. This may be related to the ovarian process rather than primary sigmoid colitis.  The cecum is displaced medially and superiorly.  No inflammation surrounds the appendix.  Scattered top normal size loops of small bowel without obstruction noted.  Evaluation of solid abdominal viscera is limited by lack of IV contrast.  Taking this limitation into account no  focal hepatic, splenic, pancreatic, renal or adrenal lesion.  No calcified gallstones.  No free intraperitoneal air.  Minimal atelectatic changes lung bases.  No abdominal aortic aneurysm.  Small hiatal hernia.  Degenerative changes lower lumbar spine.  IMPRESSION: Within the right aspect of the pelvis, there is a complex slightly dense 3.4 x 3.6 x 3.3 cm lesion which may represent a complex ovarian process possibly a hemorrhagic cyst.  Sonogram would prove helpful for further delineation.   There is a minimal amount of adjacent fluid which extends along the sigmoid colon.  This may be related to the ovarian process rather than primary sigmoid colitis.  The cecum is displaced medially and superiorly.  No inflammation surrounds the appendix.  Scattered top normal size loops of small bowel without obstruction noted.  Original Report Authenticated By: Gerrianne Scale, M.D.   Dg Abd Acute W/chest  08/28/2011  *RADIOLOGY REPORT*  Clinical Data: Abdominal pain, shortness of breath  ACUTE ABDOMEN SERIES (ABDOMEN 2 VIEW & CHEST 1 VIEW)  Comparison: Chest x-ray of 04/05/2011 and lumbar spine film of 07/09/2011  Findings: The lungs are poorly aerated and there is compression of markings at the lung bases.  No definite active process is seen. Heart size is normal.  A lower anterior cervical spine fusion plate is present.  Supine and left lateral decubitus films of the abdomen show both large and small bowel gas to be present.  No definite bowel obstruction is seen.  No free intraperitoneal air is noted.  No opaque calculi are seen.  IMPRESSION:  1.  Poor inspiration.  No active process. 2.  No evidence of bowel obstruction.  Both large and small bowel gas is present.  No free air.  Original Report Authenticated By: Juline Patch, M.D.    Medications: Scheduled Meds:    . ampicillin (OMNIPEN) IV  1 g Intravenous Q6H  .  ceFAZolin (ANCEF) IV  1 g Intravenous Q8H  . diphenhydrAMINE  25 mg Intravenous Once  . DISCONTD: bupivacaine liposome  20 mL Infiltration Once  . DISCONTD: docusate sodium  100 mg Oral BID  . DISCONTD: moxifloxacin  400 mg Intravenous Q24H  . DISCONTD: potassium chloride  10 mEq Intravenous Q1 Hr x 3  . DISCONTD: sodium chloride  10-40 mL Intracatheter Q12H   Continuous Infusions:    . lactated ringers 100 mL/hr at 09/01/11 0004  . DISCONTD: lactated ringers    . DISCONTD: lactated ringers    . DISCONTD: lactated ringers     MRI lumbar spine reviewed done   08/29/2011 IMPRESSION:  1. Central disc extrusion at L5-S1 could encroach on either S1  nerve root.  2. No other significant disc space findings. There is mild disc  bulging at L3-L4 and L4-L5.  3. The distal thoracic cord and conus medullaris appear normal.  4. Probable right adnexal hematoma as demonstrated on CT  yesterday. If the patient still has a right ovary, this could  reflect a hemorrhagic cyst. If the ovaries were removed at  hysterectomy, this could reflect a postoperative finding.    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  - Highly appreciate orthopedics, Dr. Darrelyn Hillock recommendations and assistance with the patient care, postop day #1 decompressive lumbar laminectomy and microdiskectomy at L5-S1 - Patient doing  well with PT eval, placed a CIR consult for inpatient rehabilitation  Chest pain and dyspnea/hypoxia: Resolved ? Acute anxiety attack versus aspiration - No EKG  Changes, cardiac enzymes neg, Elevated D Dimer, so V/Q scan was done which was negative, Doppler US of LE negative for any DVT - CXR done on 08/30/2011 suggested pulm edema vs multifocal PNA, however lungs clear, currently on antibiotics    *Abdominal pain, acute, generalized: Resolved, likely referred from herniated disc pain  -CT abdomen and pelvis showed no acute intra-abdominal pathology however has complex slightly dense 3.4x3.6x3.3 lesion which may represent a complex ovarian process possibly a hemorrhagic cyst. - Pelvic and transvaginal ultrasound also negative for any ovarian cyst/torsion  Enterococcus UTI: - Placed on IV ampicillin today, however if the patient tolerates oral/solid diet, can transition to oral amoxicillin tomorrow for 7  days   Questionable DVT of right leg (deep venous thrombosis) in 01/2011: Not on any anticoagulation, please see #1, Doppler ultrasounds done yesterday are negative for any DVT   Nausea and vomiting: Resolving, continue Zofran and Phenergan when necessary, advance diet today   S/P hysterectomy 4/13  DVT Prophylaxis: SCDs  Code Status: full code  Disposition: Not medically ready   LOS: 4 days   Laisa Larrick M.D. [facility] 09/01/2011, 11:01 AM Pager: [phone]

## 2011-09-01 NOTE — Evaluation (Signed)
Occupational Therapy Evaluation Patient Details Name: [name] MRN: [id number] DOB: Jan 12, 1971 Today's Date: 09/01/2011 Time: 4098-1191 OT Time Calculation (min): 26 min  OT Assessment / Plan / Recommendation Clinical Impression  This 41 year old female was admitted with generalized abdominal pain.  She had a recent TAH in April and was found to have a herniated disc at L5/S1:  she is s/p decompression.  Pt presents with LB weakness and sensory impairment and requires A x 2 for safety with standing;  a stedy was used for SPT as pt could not weight shift.  She also needs A x 2 for LB ADLs at this time.  She is appropriate for skilled OT to educate for back precautions with adls and work on strengthening and activity tolerance for adls    OT Assessment  Patient needs continued OT Services    Follow Up Recommendations  Inpatient Rehab    Barriers to Discharge      Equipment Recommendations  Rolling walker with 5" wheels;3 in 1 bedside comode;Other (comment) (will further assess shower dme)    Recommendations for Other Services Rehab consult  Frequency  Min 2X/week    Precautions / Restrictions Precautions Precautions: Back Restrictions Weight Bearing Restrictions: No Other Position/Activity Restrictions: bed to chair 6/11, progress to gait 6/12   Pertinent Vitals/Pain Premedicated. Pain increases with standing/weight bearing    ADL  Eating/Feeding: Simulated;Independent Where Assessed - Eating/Feeding: Chair Grooming: Simulated;Supervision/safety Where Assessed - Grooming: Unsupported sitting Upper Body Bathing: Simulated;Supervision/safety Where Assessed - Upper Body Bathing: Unsupported sitting Lower Body Bathing: Simulated;+2 Total assistance Lower Body Bathing: Patient Percentage:  (25%) Where Assessed - Lower Body Bathing: Supported sit to stand Upper Body Dressing: Simulated;Minimal assistance (LINES) Where Assessed - Upper Body Dressing: Unsupported  sitting Lower Body Dressing: Simulated;+2 Total assistance Lower Body Dressing: Patient Percentage: 10% Where Assessed - Lower Body Dressing: Sopported sit to stand Toileting - Clothing Manipulation and Hygiene: Simulated;+2 Total assistance Toileting - Clothing Manipulation and Hygiene: Patient Percentage: 20% Where Assessed - Toileting Clothing Manipulation and Hygiene: Sit to stand from 3-in-1 or toilet Equipment Used: Rolling walker  And (STEADY) Transfers/Ambulation Related to ADLs: cotx with PT.  Stood with walker with knees blocked.  Used steady to chair.  Pt unable to weight shift.  With LB weakness and sensory impairment ADL Comments: Educated on use of AE but pt has not practiced yet.  Reviewed back precautions with ADLS    OT Diagnosis: Generalized weakness;Acute pain  OT Problem List: Decreased strength;Decreased activity tolerance;Decreased knowledge of use of DME or AE;Decreased knowledge of precautions;Impaired sensation;Pain OT Treatment Interventions: Self-care/ADL training;DME and/or AE instruction;Therapeutic activities;Patient/family education   OT Goals Acute Rehab OT Goals OT Goal Formulation: With patient Time For Goal Achievement: 09/08/11 Potential to Achieve Goals: Good ADL Goals Pt Will Perform Grooming: with min assist;Standing at sink (min guard) ADL Goal: Grooming - Progress: Goal set today Pt Will Perform Lower Body Bathing: with min assist;Sit to stand from bed;with adaptive equipment ADL Goal: Lower Body Bathing - Progress: Goal set today Pt Will Perform Lower Body Dressing: with min assist;Sit to stand from bed;with adaptive equipment ADL Goal: Lower Body Dressing - Progress: Goal set today Pt Will Transfer to Toilet: with min assist;Stand pivot transfer;3-in-1 ADL Goal: Toilet Transfer - Progress: Goal set today Pt Will Perform Toileting - Hygiene: with min assist;Standing at 3-in-1/toilet;with adaptive equipment (min guard) ADL Goal: Toileting -  Hygiene - Progress: Goal set today  Visit Information  Last OT Received On:  09/01/11 Assistance Needed: +2    Subjective Data      Prior Functioning  Home Living Lives With: Alone Available Help at Discharge: Family;Available PRN/intermittently Type of Home: House Home Access: Stairs to enter [employer] of Steps: 4 Entrance Stairs-Rails: Right;Left;Can reach both Home Layout: One level Firefighter: Standard Home Adaptive Equipment: None Prior Function Level of Independence: Independent Able to Take Stairs?: Yes 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    Cognition  Overall Cognitive Status: Appears within functional limits for tasks assessed/performed Arousal/Alertness: Awake/alert Orientation Level: Oriented X4 / Intact Behavior During Session: WFL for tasks performed    Extremity/Trunk Assessment Right Upper Extremity Assessment RUE ROM/Strength/Tone: [facility] for tasks assessed (tested to 90 due to back precautions) Left Upper Extremity Assessment LUE ROM/Strength/Tone: WFL for tasks assessed (90 back precautions) Right Lower Extremity Assessment RLE ROM/Strength/Tone: Deficits RLE ROM/Strength/Tone Deficits: hip flexion, abd/ addcution 2+/5, quads 3/5 foot and andkle 4/5 (strength may be greater, but pt with pain in back with isometric tests.) pt feels right leg will buckle in standing RLE Sensation: Deficits;WFL - Light Touch;WFL - Proprioception RLE Sensation Deficits: pt reports hypersensitivity and decreased stereognosis below knees Left Lower Extremity Assessment LLE ROM/Strength/Tone: Deficits LLE ROM/Strength/Tone Deficits: stength 3/5 in hip flex add. quads 3+/5 foot and ankle 4/5 LLE Sensation: WFL - Light Touch;WFL - Proprioception;Deficits LLE Sensation Deficits: hepersensitivity in lower leg with decreased stereognosis Trunk Assessment Trunk Assessment: Lordotic;Other exceptions Trunk  Exceptions: large dressing over low back, but lordosis still evident.  pt with weakness in abdominal anc glute muscles   Mobility Bed Mobility Bed Mobility: Rolling Right;Right Sidelying to Sit Rolling Right: 4: Min assist;With rail Right Sidelying to Sit: 3: Mod assist Transfers Sit to Stand: 4: Min guard;From elevated surface;4: Min assist (min A with RW; min guard with stedy) Stand to Sit: 4: Min assist Transfer via Lift Equipment: Stedy Details for Transfer Assistance: pt with increased pain in standing with lordosis evident. pt with weakness and lower core instability with attempting to unweight and step especially with weight bearing through right leg     Exercise    Balance Balance Balance Assessed: Yes Static Sitting Balance Static Sitting - Balance Support: No upper extremity supported Static Sitting - Level of Assistance: 7: Independent Static Sitting - Comment/# of Minutes: 3  End of Session OT - End of Session Activity Tolerance: Patient tolerated treatment well Patient left: in chair;with call bell/phone within reach Nurse Communication: Mobility status;Other (comment) (return to bed in 30 minutes)   Braxson Hollingsworth 09/01/2011, 11:19 AM Marica Otter, OTR/L [phone] 09/01/2011

## 2011-09-01 NOTE — Progress Notes (Signed)
Subjective: Better this A.M. Will keep foley in today because of her Pre-Op Incontinence and risk of wound issues if she leaks urine. Will plan on Dc Foley tomorrow when she is ambulating.Some sensation in her legs is returning. Her motor function is intact.   Objective: Vital signs in last 24 hours:Som sensation is returning in her legs. Motor function is intact Temp:  [97.9 F (36.6 C)-98.6 F (37 C)] 98.4 F (36.9 C) (06/11 0551) Pulse Rate:  [86-106] 90  (06/11 0551) Resp:  [18-24] 20  (06/11 0551) BP: (98-134)/(44-78) 111/70 mmHg (06/11 0551) SpO2:  [93 %-100 %] 93 % (06/11 0551)  Intake/Output from previous day: 06/10 0701 - 06/11 0700 In: 2600 [I.V.:2550; IV Piggyback:50] Out: 475 [Urine:375; Blood:100] Intake/Output this shift:     Basename 08/31/11 0545 08/30/11 1253 08/29/11 2328  HGB 11.0* 11.4* 10.9*    Basename 08/31/11 0545 08/30/11 1253  WBC 8.0 12.7*  RBC 3.75* 3.85*  HCT 32.6* 33.7*  PLT 186 201    Basename 08/31/11 0545 08/30/11 0830  NA 135 131*  K 3.4* 3.9  CL 98 99  CO2 27 20  BUN 6 5*  CREATININE 0.76 0.66  GLUCOSE 96 83  CALCIUM 8.3* 8.1*    Basename 08/31/11 0545 08/30/11 1400  LABPT -- --  INR 1.25 1.10    Dorsiflexion/Plantar flexion intact Compartment soft  Assessment/Plan: Bed to chair today and up ambulating tomorrow.   Chailyn Racette A 09/01/2011, 7:08 AM

## 2011-09-01 NOTE — Op Note (Signed)
[name], [name]               ACCOUNT NO.:  [id number]  MEDICAL RECORD NO.:  [id number]  LOCATION:  1319                         FACILITY:  [facility]  PHYSICIAN:  Georges Lynch. Rolla Servidio, M.D.DATE OF BIRTH:  01/20/1971  DATE OF PROCEDURE: DATE OF DISCHARGE:                              OPERATIVE REPORT   SURGEON:  Bhavik Cabiness A. Darrelyn Hillock, M.D.  ASSISTANT:  Jene Every, M.D.  PREOPERATIVE DIAGNOSES:  MORBIDLY OBESE 1. Central spinal stenosis. 2. 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 3. Partial cauda equina syndrome.  HISTORY:  I was called to see her for the first time on Sunday, she was admitted Friday with a history of progressive loss of sensation in both lower extremities, and apparently on Friday I was told that she rolled over in bed and did not realize that she had urinary incontinence, that was the first episode.  A Foley catheter was placed in her on Friday. She initially came in on medical service because of abdominal pain and was totally worked up.  An MRI was done by the medical staff and she had excellent treatment.  I was called in on Sunday to evaluate her because of this history.  The history of mine is well documented.  POSTOPERATIVE DIAGNOSES: 1. Central spinal stenosis. 2. Large herniated disk at L5-S1. 3. Partial cauda equina syndrome.  OPERATION: 1. Central decompressive lumbar laminectomy at L5-S1 for stenosis. 2. Microdiskectomy at L5-S1 bilaterally. Note this was a complex case.  She was morbidly obese.  PROCEDURE:  Under general anesthesia with the patient on a spinal frame, she first had 1 g of IV Ancef.  At this time, the appropriate time-out was carried out.  The back was not marked initially because we were going centrally in a disk with bilateral herniation.  Note, at this time we did an initial prep followed by a sterile ChloraPrep because she was allergic to iodine.  Then 2 needles were placed in the back.  After we did the  appropriate time-out x-ray was taken to verify the position. Note, she was quite obese.  At this time, an incision was made over the L5-S1 interspace, bleeder identified and cauterized.  She had a large area of adipose tissue we had to go through, we did order the extra-long McCullough retractors ahead of time.  The incision there was carried down to both sides of the spinous process at L5-S1.  Another x-ray was taken with the instruments in place.  We then inserted our McCullough retractors.  We thoroughly irrigated out the area.  Good hemostasis was maintained.  At this particular time, we then removed the spinous process of L5 and did a central decompressive lumbar laminectomy, and had excellent exposure.  We brought the microscope in and we identified the dura.  I then started on the right side and gently retracted the dura, identified the S1 root, cauterized lateral recess veins and then a needle was placed in the L5-S1 space for localization purposes.  After the space was established we then made a cruciate incision, carried out our microdiskectomy and reached all the way across both sides.  Note we did not need to open the posterior longitudinal  ligament on the opposite side, the disk was central, we were able to easily go across the midline and decompressed all the midline disk herniation material down in the space and did a complete diskectomy.  We then did explore the S1 root on both sides.  The root was totally free and also at the dura bilaterally. Both recesses were decompressed as well.  The S1 roots bilaterally and the dura were easily movable.  We had good hemostasis.  We thoroughly irrigated out the area.  A specimen was sent to the lab.  We then loosely applied some thrombin-soaked Gelfoam.  I closed the wound in layers in the usual fashion.  I did leave a small distal and proximal deep part of the wound open for drainage purposes.  The subcu was closed in 2 layers of #1  Vicryl.  Skin was closed with metal staples and a sterile Neosporin dressing was applied.  Prior to closing the wound I injected a mixture of 20 mL of Exparel and 10 mL of normal saline.  The patient left the operating room in satisfactory condition after sterile dressings were applied.          ______________________________ Georges Lynch Darrelyn Hillock, M.D.     RAG/MEDQ  D:  08/31/2011  T:  09/01/2011  Job:  [id number]  cc:   Dr. Redge Gainer

## 2011-09-01 NOTE — Evaluation (Signed)
Physical Therapy Evaluation Patient Details Name: BRANDICE BUSSER MRN: [id number] DOB: [birth year] Today's Date: 09/01/2011 Time: 4696-2952 PT Time Calculation (min): 20 min  PT Assessment / Plan / Recommendation Clinical Impression  41 yo female with recent robotic hysterectomy adm with abdominal pain. She was found to have UTI and HNP with urinary incontinence, weakness and numbness.  She underwent lumbar decompression 08/31/11.  Pt will progress slowly due to  neuro involvement.  She may benefit from short stay at [facility]  since she will be alone at times at D/C and does have a supportive familiy.    PT Assessment  Patient needs continued PT services    Follow Up Recommendations  Inpatient Rehab    Barriers to Discharge Decreased caregiver support      lEquipment Recommendations  Rolling walker with 5" wheels;3 in 1 bedside comode;Other (comment) (will further assess shower dme)    Recommendations for Other Services Rehab consult   Frequency Min 4X/week    Precautions / Restrictions Precautions Precautions: Back Restrictions Weight Bearing Restrictions: No Other Position/Activity Restrictions: bed to chair 6/11, progress to gait 6/12   Pertinent Vitals/Pain Pt reports increased pain in standing despite premed      Mobility  Bed Mobility Bed Mobility: Rolling Right;Right Sidelying to Sit Rolling Right: 4: Min assist;With rail Right Sidelying to Sit: 3: Mod assist Transfers Transfers: Sit to Stand;Stand to Sit Sit to Stand: 4: Min guard;From elevated surface;4: Min assist (min A with RW; min guard with stedy) Stand to Sit: 4: Min assist Transfer via Lift Equipment: Stedy Details for Transfer Assistance: pt with increased pain in standing with lordosis evident. pt with weakness and lower core instability with attempting to unweight and step especially with weight bearing through right leg   Ambulation/Gait Ambulation/Gait Assistance: Not tested (comment) (due to pain,  weakness, bed to chair limitation today) Assistive device: Rolling walker (stand only) Stairs: No Wheelchair Mobility Wheelchair Mobility: No    Exercises     PT Diagnosis: Difficulty walking;Generalized weakness;Acute pain  PT Problem List: Decreased strength;Decreased activity tolerance;Decreased mobility;Decreased knowledge of use of DME;Obesity;Pain PT Treatment Interventions: Functional mobility training;Gait training;Therapeutic activities;Therapeutic exercise;Balance training;DME instruction;Stair training;Patient/family education   PT Goals Acute Rehab PT Goals PT Goal Formulation: With patient Time For Goal Achievement: 09/15/11 Potential to Achieve Goals: Good Pt will go Supine/Side to Sit: with modified independence PT Goal: Supine/Side to Sit - Progress: Goal set today Pt will go Sit to Stand: with modified independence PT Goal: Sit to Stand - Progress: Goal set today Pt will Ambulate: >150 feet;with modified independence;with least restrictive assistive device PT Goal: Ambulate - Progress: Goal set today Pt will Perform Home Exercise Program: Independently PT Goal: Perform Home Exercise Program - Progress: Goal set today  Visit Information  Last PT Received On: 09/01/11 Assistance Needed: +2 PT/OT Co-Evaluation/Treatment: Yes    Subjective Data  Subjective: I think the feeling is coming back in my legs   Prior Functioning  Home Living Lives With: Alone Available Help at Discharge: Family;Available PRN/intermittently Type of Home: House Home Access: Stairs to enter [employer] of Steps: 4 Entrance Stairs-Rails: Right;Left;Can reach both Home Layout: One level Firefighter: Standard Home Adaptive Equipment: None Prior Function Level of Independence: Independent Able to Take Stairs?: Yes 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    Cognition  Overall Cognitive Status: Appears within  functional limits for tasks assessed/performed Arousal/Alertness: Awake/alert Orientation Level: Oriented X4 / Intact Behavior During Session: [facility] for tasks  performed    Extremity/Trunk Assessment Right Upper Extremity Assessment RUE ROM/Strength/Tone: WFL for tasks assessed (tested to 90 due to back precautions) Left Upper Extremity Assessment LUE ROM/Strength/Tone: [facility] for tasks assessed (90 back precautions) Right Lower Extremity Assessment RLE ROM/Strength/Tone: Deficits RLE ROM/Strength/Tone Deficits: hip flexion, abd/ addcution 2+/5, quads 3/5 foot and andkle 4/5 (strength may be greater, but pt with pain in back with isometric tests.) pt feels right leg will buckle in standing RLE Sensation: Deficits;WFL - Light Touch;WFL - Proprioception RLE Sensation Deficits: pt reports hypersensitivity and decreased stereognosis below knees Left Lower Extremity Assessment LLE ROM/Strength/Tone: Deficits LLE ROM/Strength/Tone Deficits: stength 3/5 in hip flex add. quads 3+/5 foot and ankle 4/5 LLE Sensation: WFL - Light Touch;WFL - Proprioception;Deficits LLE Sensation Deficits: hepersensitivity in lower leg with decreased stereognosis Trunk Assessment Trunk Assessment: Lordotic;Other exceptions Trunk Exceptions: large dressing over low back, but lordosis still evident.  pt with weakness in abdominal anc glute muscles   Balance Balance Balance Assessed: Yes Static Sitting Balance Static Sitting - Balance Support: No upper extremity supported Static Sitting - Level of Assistance: 7: Independent Static Sitting - Comment/# of Minutes: 3  End of Session PT - End of Session Activity Tolerance: Patient limited by pain Patient left: in chair Nurse Communication: Mobility status;Need for lift equipment   Donnetta Hail 09/01/2011, 12:05 PM

## 2011-09-01 NOTE — Consult Note (Signed)
Physical Medicine and Rehabilitation Consult Reason for Consult: BLE weakness and numbness Referring Physician:  Dr. Isidoro Donning   HPI: [name] is a 41 y.o. female with history of scoliosis, abdominal pain with hysterectomy 4/13 and BV treated 5/13, admitted on 06/07 with abdominal pain with N/V. Treated with IVF. Patient reported urinary incontinence with BLE numbness and tingling with weakness on 06/08. MRI L-spine done revealing right adnexal lesion likely hematoma, central disc extrusion L5-S1 could encroach on S1 nerve root.  Pelvic/transvaginal ultrasound without evidence of ovarian cyst. Patient developed CP with dyspnea with ?anxiety attack v/s aspiration PNA.  Started on IV antibiotics as CXR with question of multifocal PNA.  Evaluated by Dr. Darrelyn Hillock and patient underwent microdiskectomy at L5-S1 for decompressive laminectomy. PT/OT evaluations done today revealing patient to have difficulty weight bearing and instability of RLE.  MD, PT, OT recommending CIR.  The patient states her legs were totally numb for a couple days, now they feel like they are waking up Patient still has Foley catheter in. She had urinary retention prior to surgery  Review of Systems  Eyes: Negative for blurred vision and double vision.  Respiratory: Negative for shortness of breath and wheezing. Sputum production:  prior to admission.   Cardiovascular: Negative for chest pain.  Gastrointestinal: Positive for constipation.  Genitourinary: Negative for urgency and frequency.  Musculoskeletal: Positive for myalgias and back pain.  Neurological: Positive for tingling (right> left foot.). Negative for headaches.   Past Medical History  Diagnosis Date  . DVT (deep venous thrombosis)     ? vs lupus  . Fibroids   . Anemia   . Headache   . Difficult intravenous access     hands are usually best for blood draws and IV starts  . Complication of anesthesia     slow to wake up  . Scoliosis    Past Surgical  History  Procedure Date  . Neck surgery   . Cervical spine surgery   . Myomectomy   . Wisdom tooth extraction   . Cesarean section 1996  . Abdominal hysterectomy 06/29/11    Robotic Assisted Hysterectomy   Family History  Problem Relation Age of Onset  . Anesthesia problems Neg Hx   . Hypertension Maternal Grandmother    Social History:  Lives with 54 year old son.  Works  as a Interior and spatial designer.  She reports that she has never smoked. She has never used smokeless tobacco. She reports that she does not drink alcohol or use illicit drugs.Has a supportive family that can check in past discharge.  Allergies  Allergen Reactions  . Iodine Hives  . Shrimp (Shellfish Allergy) Hives   Medications Prior to Admission  Medication Sig Dispense Refill  . cholecalciferol (VITAMIN D) 1000 UNITS tablet Take 2,000 Units by mouth every morning.         Home: Home Living Lives With: Alone Available Help at Discharge: Family;Available PRN/intermittently Type of Home: House Home Access: Stairs to enter [employer] of Steps: 4 Entrance Stairs-Rails: Right;Left;Can reach both Home Layout: One level Firefighter: Standard Home Adaptive Equipment: None  Functional History: Prior Function Able to Take Stairs?: Yes Vocation: Full time employment Comments: hairdresser Functional Status:  Mobility: Bed Mobility Bed Mobility: Rolling Right;Right Sidelying to Sit Rolling Right: 4: Min assist;With rail Right Sidelying to Sit: 3: Mod assist Transfers Transfers: Sit to Stand;Stand to Sit Sit to Stand: 4: Min guard;From elevated surface;4: Min assist (min A with RW; min guard with stedy) Stand to  Sit: 4: Min assist Transfer via Lift Equipment: Stedy Ambulation/Gait Ambulation/Gait Assistance: Not tested (comment) (due to pain, weakness, bed to chair limitation today) Assistive device: Rolling walker (stand only) Stairs: No Wheelchair Mobility Wheelchair Mobility:  No  ADL: ADL Eating/Feeding: Simulated;Independent Where Assessed - Eating/Feeding: Chair Grooming: Simulated;Supervision/safety Where Assessed - Grooming: Unsupported sitting Upper Body Bathing: Simulated;Supervision/safety Where Assessed - Upper Body Bathing: Unsupported sitting Lower Body Bathing: Simulated;+2 Total assistance Where Assessed - Lower Body Bathing: Supported sit to stand Upper Body Dressing: Simulated;Minimal assistance (LINES) Where Assessed - Upper Body Dressing: Unsupported sitting Lower Body Dressing: Simulated;+2 Total assistance Where Assessed - Lower Body Dressing: Sopported sit to stand Equipment Used: Rolling walker (STEADY) Transfers/Ambulation Related to ADLs: cotx with PT.  Stood with walker with knees blocked.  Used steady to chair.  Pt unable to weight shift.  With LB weakness and sensory impairment ADL Comments: Educated on use of AE but pt has not practiced yet.  Reviewed back precautions with ADLS  Cognition: Cognition Arousal/Alertness: Awake/alert Orientation Level: Oriented X4 Cognition Overall Cognitive Status: Appears within functional limits for tasks assessed/performed Arousal/Alertness: Awake/alert Orientation Level: Oriented X4 / Intact Behavior During Session: [facility] for tasks performed  Blood pressure 111/70, pulse 90, temperature 98.4 F (36.9 C), temperature source Oral, resp. rate 20, height 5\' 4"  (1.626 m), weight 107.8 kg (237 lb 10.5 oz), last menstrual period 02/27/2011, SpO2 93.00%. Physical Exam  Nursing note and vitals reviewed. Constitutional: She is oriented to person, place, and time. She appears well-developed and well-nourished.       obese  HENT:  Head: Normocephalic and atraumatic.  Eyes: Pupils are equal, round, and reactive to light.  Neck: Normal range of motion.  Cardiovascular: Normal rate and regular rhythm.   Pulmonary/Chest: Effort normal and breath sounds normal.  Abdominal: Soft. Bowel sounds are normal.   Musculoskeletal: She exhibits no edema and no tenderness.  Neurological: She is alert and oriented to person, place, and time.       Decreased sensation RLE>LLE.  Skin: Skin is warm and dry.   Sensation is reduced to light touch in both ankles feet and toes. Intact at the knees. 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 1. Does the need for close, 24 hr/day medical supervision in concert with the patient's rehab needs make it unreasonable for this patient to be served in a less intensive setting? Potentially 2. Co-Morbidities requiring supervision/potential complications: morbid obesity, history of DVT, and in history of vaginal bleeding 3. Due to bladder management, bowel management, safety, skin/wound care, disease management, medication administration, pain management and patient education, does the patient require 24 hr/day rehab nursing? Potentially 4. Does the patient require coordinated care of a physician, rehab nurse, PT (1-2 hrs/day, 5 days/week) and OT (1-2 hrs/day, 5 days/week) to address physical and functional deficits in the context of the above medical diagnosis(es)? Potentially Addressing deficits in the following areas: balance, endurance, locomotion, strength, transferring, bowel/bladder control, bathing, dressing and toileting 5. Can the patient actively participate in an intensive therapy program of at least 3 hrs of therapy per day at least 5 days per week? Potentially 6. The potential for patient to make measurable gains while on inpatient rehab is good 7. Anticipated functional  outcomes upon discharge from inpatient rehab are supervision to modified  independent mobility with PT, supervision to modified independent ADLs with OT, not applicable with SLP. 8. Estimated rehab length of stay to reach the above functional goals is: 7 days 9. Does the patient have adequate social supports to accommodate these discharge functional goals? Potentially 10. Anticipated D/C setting: Home 11. Anticipated post D/C treatments: HH therapy 12. Overall Rehab/Functional Prognosis: good  RECOMMENDATIONS: This patient's condition is appropriate for continued rehabilitative care in the following setting: rehabilitation RN to followup on therapy progress. Also will determine whether patient has voiding difficulties post Foley discontinuation on 6/12.if the patient still requires at least minimal physical assistance for mobility and ADLs postoperative day 2, would recommend a short inpatient rehabilitation stay. Patient has agreed to participate in recommended program. Potentially Note that insurance prior authorization may be required for reimbursement for recommended care.  Comment:    09/01/2011

## 2011-09-02 DIAGNOSIS — R0902 Hypoxemia: Secondary | ICD-10-CM

## 2011-09-02 DIAGNOSIS — R1084 Generalized abdominal pain: Secondary | ICD-10-CM

## 2011-09-02 DIAGNOSIS — E876 Hypokalemia: Secondary | ICD-10-CM

## 2011-09-02 DIAGNOSIS — M545 Low back pain: Secondary | ICD-10-CM

## 2011-09-02 LAB — BASIC METABOLIC PANEL
GFR calc Af Amer: >90 mL/min (ref >90)
GFR calc non Af Amer: >90 mL/min (ref >90)
Potassium: 3.2 mEq/L — ABNORMAL LOW (ref 3.5–5.1)
Sodium: 136 mEq/L (ref 135–145)

## 2011-09-02 LAB — CBC
MCHC: 33.3 g/dL (ref 30.0–36.0)
RDW: 13 % (ref 11.5–15.5)

## 2011-09-02 MED ORDER — MENTHOL 3 MG MT LOZG
1.0000 | LOZENGE | OROMUCOSAL | Status: DC | PRN
  Administered 2011-09-02: 3 mg via ORAL
  Filled 2011-09-02: qty 9

## 2011-09-02 MED ORDER — POTASSIUM CHLORIDE CRYS ER 20 MEQ PO TBCR
40.0000 meq | EXTENDED_RELEASE_TABLET | ORAL | Status: AC
  Administered 2011-09-02: 40 meq via ORAL
  Filled 2011-09-02 (×2): qty 2

## 2011-09-02 MED ORDER — DIPHENHYDRAMINE HCL 25 MG PO CAPS
25.0000 mg | ORAL_CAPSULE | ORAL | Status: DC | PRN
  Administered 2011-09-02 – 2011-09-03 (×5): 25 mg via ORAL
  Filled 2011-09-02 (×5): qty 1

## 2011-09-02 MED FILL — Bacitracin Zinc Oint 500 Unit/GM: CUTANEOUS | Qty: 15 | Status: AC

## 2011-09-02 NOTE — Progress Notes (Addendum)
Patient ID: [name]  female  ZOX:[id number]    DOB: [birth year]    DOA: 08/28/2011  PCP: Alva Garnet., MD  Subjective: s/p decompressive lumbar laminectomy and microdiskectomy at L5-S1 -Postop day 2. Up working with PT today, asking about CIR. Requesting benadryl to be changed to q4 as needed. Denies n/v. Chart reviewed Objective: Weight change:   Intake/Output Summary (Last 24 hours) at 09/02/11 0915 Last data filed at 09/02/11 0500  Gross per 24 hour  Intake    480 ml  Output    850 ml  Net   -370 ml   Blood pressure 90/57, pulse 115, temperature 99.5 F (37.5 C), temperature source Oral, resp. rate 18, height 5\' 4"  (1.626 m), weight 107.8 kg (237 lb 10.5 oz), last menstrual period 02/27/2011, SpO2 92.00%.  Physical Exam: General: Alert and awake, oriented x3, not in any acute distress. HEENT: anicteric sclera, pupils reactive to light and accommodation, EOMI CVS: S1-S2 clear, no murmur rubs or gallops Chest: clear to auscultation bilaterally, no wheezing, rales or rhonchi Abdomen: soft, non- tender, nondistended, normal bowel sounds, no organomegaly Extremities: no cyanosis, clubbing or edema noted bilaterally   Lab Results: Basic Metabolic Panel:  Lab 09/02/11 8119 08/31/11 0545  NA 136 135  K 3.2* 3.4*  CL 101 98  CO2 29 27  GLUCOSE 124* 96  BUN 7 6  CREATININE 0.75 0.76  CALCIUM 7.9* 8.3*  MG -- --  PHOS -- --   Liver Function Tests:  Lab 08/29/11 0415 08/28/11 1055  AST 11 17  ALT 8 13  ALKPHOS 63 82  BILITOT 0.6 0.4  PROT 6.6 8.9*  ALBUMIN 2.8* 4.1    Lab 08/28/11 1055  LIPASE 16  AMYLASE --   CBC:  Lab 09/02/11 0645 08/31/11 0545 08/29/11 2328  WBC 8.6 8.0 --  NEUTROABS -- -- 8.8*  HGB 9.4* 11.0* --  HCT 28.2* 32.6* --  MCV 87.6 86.9 --  PLT 236 186 --     Studies/Results: Ct Abdomen Pelvis Wo Contrast  08/28/2011  **ADDENDUM** CREATED: 08/28/2011 17:06:10  Original report read by Dr. Constance Goltz.  Addendum by Dr. Purcell Mouton. Review of the  study was requested by the emergency department at this patient who reportedly underwent total hysterectomy on 06/29/2011. On review of the pathology from that surgical procedure, there is no mention of ovarian tissue in the specimen. Therefore, the right adnexal finding could be related to an ovarian remnant. Alternately, this could reflect a postsurgical finding.  As stated, pelvic ultrasound may be helpful for further evaluation as clinically warranted.  **END ADDENDUM** SIGNED BY: Gerrianne Scale, M.D.   08/28/2011  *RADIOLOGY REPORT*  Clinical Data: Central lower abdominal pain.  CT ABDOMEN AND PELVIS WITHOUT CONTRAST  Technique:  Multidetector CT imaging of the abdomen and pelvis was performed following the standard protocol without intravenous contrast.  Comparison: None.  Findings: Within the right aspect of the pelvis, there is a complex slightly dense 3.4 x 3.6 x 3.3 cm lesion which may represent a complex ovarian process possibly a hemorrhagic cyst.  Sonogram would prove helpful for further delineation.  There is a minimal amount of adjacent fluid which extends along the sigmoid colon. This may be related to the ovarian process rather than primary sigmoid colitis.  The cecum is displaced medially and superiorly.  No inflammation surrounds the appendix.  Scattered top normal size loops of small bowel without obstruction noted.  Evaluation of solid abdominal viscera is limited by lack of IV  contrast.  Taking this limitation into account no focal hepatic, splenic, pancreatic, renal or adrenal lesion.  No calcified gallstones.  No free intraperitoneal air.  Minimal atelectatic changes lung bases.  No abdominal aortic aneurysm.  Small hiatal hernia.  Degenerative changes lower lumbar spine.  IMPRESSION: Within the right aspect of the pelvis, there is a complex slightly dense 3.4 x 3.6 x 3.3 cm lesion which may represent a complex ovarian process possibly a hemorrhagic cyst.  Sonogram would prove helpful for  further delineation.  There is a minimal amount of adjacent fluid which extends along the sigmoid colon.  This may be related to the ovarian process rather than primary sigmoid colitis.  The cecum is displaced medially and superiorly.  No inflammation surrounds the appendix.  Scattered top normal size loops of small bowel without obstruction noted.  Original Report Authenticated By: Gerrianne Scale, M.D.   Dg Abd Acute W/chest  08/28/2011  *RADIOLOGY REPORT*  Clinical Data: Abdominal pain, shortness of breath  ACUTE ABDOMEN SERIES (ABDOMEN 2 VIEW & CHEST 1 VIEW)  Comparison: Chest x-ray of 04/05/2011 and lumbar spine film of 07/09/2011  Findings: The lungs are poorly aerated and there is compression of markings at the lung bases.  No definite active process is seen. Heart size is normal.  A lower anterior cervical spine fusion plate is present.  Supine and left lateral decubitus films of the abdomen show both large and small bowel gas to be present.  No definite bowel obstruction is seen.  No free intraperitoneal air is noted.  No opaque calculi are seen.  IMPRESSION:  1.  Poor inspiration.  No active process. 2.  No evidence of bowel obstruction.  Both large and small bowel gas is present.  No free air.  Original Report Authenticated By: Juline Patch, M.D.    Medications: Scheduled Meds:    . ampicillin (OMNIPEN) IV  1 g Intravenous Q6H  .  ceFAZolin (ANCEF) IV  1 g Intravenous Q8H  . potassium chloride  40 mEq Oral Q4H   Continuous Infusions:    . lactated ringers 100 mL/hr at 09/01/11 0004   MRI lumbar spine reviewed done  08/29/2011 IMPRESSION:  1. Central disc extrusion at L5-S1 could encroach on either S1  nerve root.  2. No other significant disc space findings. There is mild disc  bulging at L3-L4 and L4-L5.  3. The distal thoracic cord and conus medullaris appear normal.  4. Probable right adnexal hematoma as demonstrated on CT  yesterday. If the patient still has a right ovary,  this could  reflect a hemorrhagic cyst. If the ovaries were removed at  hysterectomy, this could reflect a postoperative finding.    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  - appreciate orthopedics/Dr. Darrelyn Hillock recommendations and assistance with the patient care, postop day #2 decompressive lumbar laminectomy and microdiskectomy at L5-S1 - Patient doing well with PT eval -await CIR f/u final decision  Chest pain and dyspnea/hypoxia: Resolved ? Acute anxiety attack versus aspiration - No EKG  Changes, cardiac enzymes neg, Elevated D Dimer, so V/Q scan was done which was negative, Doppler US of LE negative for any DVT - CXR done on 08/30/2011 suggested pulm edema vs multifocal PNA, however lungs clear, currently on antibiotics    *  Abdominal pain, acute, generalized: Resolved, likely referred from herniated disc pain  -CT abdomen and pelvis showed no acute intra-abdominal pathology however has complex slightly dense 3.4x3.6x3.3 lesion which may represent a complex ovarian process possibly a hemorrhagic cyst. - Pelvic and transvaginal ultrasound also negative for any ovarian cyst/torsion  Enterococcus UTI: - on IV ampicillin today,follow and transition to oral amoxicillin for  7 days   Questionable DVT of right leg (deep venous thrombosis) in 01/2011: Not on any anticoagulation, please see #1, Doppler ultrasounds done yesterday are negative for any DVT   Nausea and vomiting: Resolved, continue Zofran and Phenergan when necessary, advance diet today   S/P hysterectomy 4/13  Hypokalemia- replace k DVT Prophylaxis: SCDs  Code Status: full code  Disposition: To Rehab when ok with ORTHO   LOS: 5 days   Kela Millin  M.D. [facility] 09/02/2011, 9:15 AM Pager: [phone]

## 2011-09-02 NOTE — Progress Notes (Signed)
Occupational Therapy Treatment Patient Details Name: [name] MRN: [id number] DOB: September 05, 1970 Today's Date: 09/02/2011 Time: 4098-1191 OT Time Calculation (min): 25 min  OT Assessment / Plan / Recommendation Comments on Treatment Session MD came at the end of OT/PT cotx.  Will return for AE/ADLs    Follow Up Recommendations  Inpatient Rehab    Barriers to Discharge       Equipment Recommendations  Defer to next venue    Recommendations for Other Services    Frequency Min 2X/week   Plan Discharge plan remains appropriate    Precautions / Restrictions Precautions Precautions: Back Restrictions Other Position/Activity Restrictions: weakness/sensory changes R leg, prone to buckle   Pertinent Vitals/Pain 10 back pain--premedicated    ADL  Toilet Transfer: Simulated;+2 Total assistance (help to advance legs/weight shift) Toilet Transfer: Patient Percentage: 60% Transfers/Ambulation Related to ADLs: Pt walked to door, +2 for safety.  See PT's note ADL Comments: Pt recalled 3 back precautions functionally    OT Diagnosis:    OT Problem List:   OT Treatment Interventions:     OT Goals Acute Rehab OT Goals Time For Goal Achievement: 09/08/11 ADL Goals Pt Will Transfer to Toilet: with min assist;Stand pivot transfer;3-in-1 ADL Goal: Toilet Transfer - Progress: Progressing toward goals  Visit Information  Last OT Received On: 09/02/11 Assistance Needed: +2 (safety) PT/OT Co-Evaluation/Treatment: Yes    Subjective Data      Prior Functioning       Cognition  Overall Cognitive Status: Appears within functional limits for tasks assessed/performed Behavior During Session: [facility] for tasks performed    Mobility Bed Mobility Rolling Right: 4: Min assist;With rail Transfers Sit to Stand: 4: Min assist;From elevated surface;Other (comment) Stand to Sit: 4: Min assist;To chair/3-in-1 Details for Transfer Assistance: VC for pushing from recliner and to reach to  recliner., VC for being aware of R leg   Exercises   Balance Static Sitting Balance Static Sitting - Balance Support: No upper extremity supported Static Sitting - Level of Assistance: 7: Independent  End of Session OT - End of Session Equipment Utilized During Treatment: Gait belt (rolling walker) Activity Tolerance: Patient tolerated treatment well   Arwen Haseley 09/02/2011, 11:48 AM Marica Otter, OTR/L [phone] 09/02/2011

## 2011-09-02 NOTE — Progress Notes (Signed)
Subjective: Up ambulating today. Sensation slowly improving in her legs. Some pain in her Right lower leg.[facility] evaluating her for transfer.Dressinf changed and wound looks fine.   Objective: Vital signs in last 24 hours: Temp:  [98.2 F (36.8 C)-99.5 F (37.5 C)] 99.5 F (37.5 C) (06/12 0630) Pulse Rate:  [100-115] 115  (06/12 0630) Resp:  [16-20] 18  (06/12 0630) BP: (90-112)/(57-81) 90/57 mmHg (06/12 0630) SpO2:  [92 %] 92 % (06/12 0630)  Intake/Output from previous day: 06/11 0701 - 06/12 0700 In: 480 [P.O.:480] Out: 1150 [Urine:1150] Intake/Output this shift: Total I/O In: 120 [P.O.:120] Out: 250 [Urine:250]   Basename 09/02/11 0645 08/31/11 0545  HGB 9.4* 11.0*    Basename 09/02/11 0645 08/31/11 0545  WBC 8.6 8.0  RBC 3.22* 3.75*  HCT 28.2* 32.6*  PLT 236 186    Basename 09/02/11 0645 08/31/11 0545  NA 136 135  K 3.2* 3.4*  CL 101 98  CO2 29 27  BUN 7 6  CREATININE 0.75 0.76  GLUCOSE 124* 96  CALCIUM 7.9* 8.3*    Basename 08/31/11 0545 08/30/11 1400  LABPT -- --  INR 1.25 1.10    Dorsiflexion/Plantar flexion intact No cellulitis present  Assessment/Plan: Rehab tomorrow.   Nester Bachus A 09/02/2011, 1:35 PM

## 2011-09-02 NOTE — Progress Notes (Signed)
Per rehab MD consult on 09/01/11, patient would be good CIR candidate. She worked with PT and OT today and was able to ambulate. Her foley is to be removed today and will anticipate her being ready for CIR tomorrow on 09/03/11. Discussed this with patient and she is very motivated and agreeable to CIR. Will f/u in am. Marina Goodell adm coordinator [phone].

## 2011-09-02 NOTE — Progress Notes (Signed)
Occupational Therapy Treatment Patient Details Name: [name] MRN: [id number] DOB: June 29, 1970 Today's Date: 09/02/2011 Time: 4696-2952 OT Time Calculation (min): 31 min  OT Assessment / Plan / Recommendation Comments on Treatment Session MD came at the end of OT/PT cotx.  Will return for AE/ADLs    Follow Up Recommendations  Inpatient Rehab    Barriers to Discharge       Equipment Recommendations  Defer to next venue    Recommendations for Other Services    Frequency Min 2X/week   Plan Discharge plan remains appropriate    Precautions / Restrictions Precautions Precautions: Back Restrictions Other Position/Activity Restrictions: weakness/sensory changes R leg, prone to buckle   Pertinent Vitals/Pain Back pain, moderate; RLE tingling--repositioned    ADL  Lower Body Bathing: Simulated;Moderate assistance (WITH LONG SPONGE) Where Assessed - Lower Body Bathing: Supported sit to stand Lower Body Dressing: Performed;Simulated;Moderate assistance (WITH AE;; PERFORMED SOCK) Where Assessed - Lower Body Dressing: Sopported sit to stand Toilet Transfer: Simulated;+2 Total assistance Toilet Transfer: Patient Percentage: 60% Toilet Transfer Method: Stand pivot (CHAIR TO BED) Transfers/Ambulation Related to ADLs: Pt had difficulty advancing legs with transfer back to bed.   Did standing rest break before doing ADL activities ADL Comments: Pt recalled 3 back precautions functionally    OT Diagnosis:    OT Problem List:   OT Treatment Interventions:     OT Goals Acute Rehab OT Goals Time For Goal Achievement: 09/08/11 ADL Goals Pt Will Perform Lower Body Bathing: with min assist;Sit to stand from bed;with adaptive equipment ADL Goal: Lower Body Bathing - Progress: Progressing toward goals Pt Will Perform Lower Body Dressing: with min assist;Sit to stand from bed;with adaptive equipment ADL Goal: Lower Body Dressing - Progress: Progressing toward goals Pt Will Transfer to  Toilet: with min assist;Stand pivot transfer;3-in-1 ADL Goal: Toilet Transfer - Progress: Progressing toward goals  Visit Information  Last OT Received On: 09/02/11 Assistance Needed: +2 PT/OT Co-Evaluation/Treatment: Yes    Subjective Data      Prior Functioning       Cognition  Overall Cognitive Status: Appears within functional limits for tasks assessed/performed Behavior During Session: [facility] for tasks performed    Mobility  Sit to Stand: 4: Min assist   Exercises    Balance    End of Session OT - End of Session Equipment Utilized During Treatment: Gait belt (rolling walker) Activity Tolerance: Patient limited by pain   Phyllis Whitefield 09/02/2011, 2:39 PM Marica Otter, OTR/L [phone] 09/02/2011

## 2011-09-02 NOTE — Progress Notes (Signed)
Physical Therapy Treatment Patient Details Name: [name] MRN: [id number] DOB: 12/15/1970 Today's Date: 09/02/2011 Time: 8413-2440 PT Time Calculation (min): 22 min  PT Assessment / Plan / Recommendation Comments on Treatment Session  pt  reports that R foot continues to tingle, that she does not feel R leg from midthigh distally while walking. pt is motivated, continues w/ significant sensory/motor deficits of R>L Le's. requires +2 for safety w/ gait training.  R knee does not profoundly buckle at stance but is noted to be unsteady w/ precautions taken. recommend CIR to return to Modified Independence.    Follow Up Recommendations  Inpatient Rehab    Barriers to Discharge        Equipment Recommendations  Defer to next venue    Recommendations for Other Services Rehab consult  Frequency Min 4X/week   Plan Discharge plan remains appropriate;Frequency remains appropriate    Precautions / Restrictions Precautions Precautions: Back Restrictions Other Position/Activity Restrictions: weakness/sensory changes R leg, prone to buckle   Pertinent Vitals/Pain Premedicated, back pain controlled    Mobility  Transfers Transfers: Sit to Stand;Stand to Sit Sit to Stand: 4: Min assist;From elevated surface;Other (comment) (at [facility]) Stand to Sit: 4: Min assist;To chair/3-in-1 Details for Transfer Assistance: VC for pushing from recliner and to reach to recliner., VC for being aware of R leg Ambulation/Gait Ambulation/Gait Assistance: 1: +2 Total assist Ambulation/Gait: Patient Percentage: 60% Ambulation Distance (Feet): 8 Feet Assistive device: Rolling walker Ambulation/Gait Assistance Details: manual support to R knee to protect from buckling during stance. Manual  assitance for advancing R LE during swing for 75% of R  steps due to noted decreased ability to advance RLE, tends to flex knee w/ difficulty extending knee during swing. Also note that R foot is inverting during swing and  stance. Gait Pattern: Step-through pattern;Decreased stance time - right;Decreased dorsiflexion - right;Right genu recurvatum Gait velocity: very slow and cautious    Exercises General Exercises - Lower Extremity Ankle Circles/Pumps: AROM;Right;Left;10 reps;Seated Long Arc Quad: AROM;Right;Left;10 reps;Seated Hip Flexion/Marching: AROM;Right;Left;10 reps   PT Diagnosis:    PT Problem List:   PT Treatment Interventions:     PT Goals Acute Rehab PT Goals Pt will go Sit to Stand: with modified independence PT Goal: Sit to Stand - Progress: Progressing toward goal Pt will Ambulate: >150 feet;with modified independence;with least restrictive assistive device PT Goal: Ambulate - Progress: Progressing toward goal Pt will Perform Home Exercise Program: Independently PT Goal: Perform Home Exercise Program - Progress: Progressing toward goal  Visit Information  Last PT Received On: 09/02/11 Assistance Needed: +2    Subjective Data  Subjective: My R leg has no feeling   Cognition  Overall Cognitive Status: Appears within functional limits for tasks assessed/performed    Balance  Static Sitting Balance Static Sitting - Balance Support: No upper extremity supported Static Sitting - Level of Assistance: 7: Independent  End of Session PT - End of Session Equipment Utilized During Treatment: Gait belt Activity Tolerance: Patient tolerated treatment well Patient left: in chair;with call bell/phone within reach Nurse Communication: Mobility status    Rada Hay 09/02/2011, 10:34 AM [phone]

## 2011-09-03 ENCOUNTER — Inpatient Hospital Stay (HOSPITAL_COMMUNITY)
Admission: RE | Admit: 2011-09-03 | Discharge: 2011-09-08 | DRG: 945 | Disposition: A | Discharge status: Home / Self Care | Payer: Medicaid Other | Source: Ambulatory Visit | Attending: Physical Medicine & Rehabilitation | Admitting: Physical Medicine & Rehabilitation

## 2011-09-03 DIAGNOSIS — M4120 Other idiopathic scoliosis, site unspecified: Secondary | ICD-10-CM

## 2011-09-03 DIAGNOSIS — Z5189 Encounter for other specified aftercare: Secondary | ICD-10-CM

## 2011-09-03 DIAGNOSIS — R0902 Hypoxemia: Secondary | ICD-10-CM

## 2011-09-03 DIAGNOSIS — R32 Unspecified urinary incontinence: Secondary | ICD-10-CM

## 2011-09-03 DIAGNOSIS — R112 Nausea with vomiting, unspecified: Secondary | ICD-10-CM

## 2011-09-03 DIAGNOSIS — N390 Urinary tract infection, site not specified: Secondary | ICD-10-CM | POA: Diagnosis present

## 2011-09-03 DIAGNOSIS — R5381 Other malaise: Secondary | ICD-10-CM

## 2011-09-03 DIAGNOSIS — Z86718 Personal history of other venous thrombosis and embolism: Secondary | ICD-10-CM

## 2011-09-03 DIAGNOSIS — E876 Hypokalemia: Secondary | ICD-10-CM

## 2011-09-03 DIAGNOSIS — G834 Cauda equina syndrome: Secondary | ICD-10-CM

## 2011-09-03 DIAGNOSIS — K5900 Constipation, unspecified: Secondary | ICD-10-CM

## 2011-09-03 DIAGNOSIS — B952 Enterococcus as the cause of diseases classified elsewhere: Secondary | ICD-10-CM

## 2011-09-03 DIAGNOSIS — Z9889 Other specified postprocedural states: Secondary | ICD-10-CM

## 2011-09-03 DIAGNOSIS — R1084 Generalized abdominal pain: Secondary | ICD-10-CM

## 2011-09-03 DIAGNOSIS — Z79899 Other long term (current) drug therapy: Secondary | ICD-10-CM

## 2011-09-03 DIAGNOSIS — D62 Acute posthemorrhagic anemia: Secondary | ICD-10-CM

## 2011-09-03 DIAGNOSIS — M545 Low back pain: Secondary | ICD-10-CM

## 2011-09-03 DIAGNOSIS — K592 Neurogenic bowel, not elsewhere classified: Secondary | ICD-10-CM

## 2011-09-03 DIAGNOSIS — M5126 Other intervertebral disc displacement, lumbar region: Secondary | ICD-10-CM

## 2011-09-03 LAB — BASIC METABOLIC PANEL
CO2: 29 mEq/L (ref 19–32)
GFR calc non Af Amer: >90 mL/min (ref >90)
Glucose, Bld: 106 mg/dL — ABNORMAL HIGH (ref 70–99)
Potassium: 3.6 mEq/L (ref 3.5–5.1)
Sodium: 134 mEq/L — ABNORMAL LOW (ref 135–145)

## 2011-09-03 LAB — CBC
Hemoglobin: 9.6 g/dL — ABNORMAL LOW (ref 12.0–15.0)
RBC: 3.26 MIL/uL — ABNORMAL LOW (ref 3.87–5.11)
WBC: 7.9 10*3/uL (ref 4.0–10.5)

## 2011-09-03 MED ORDER — HYDROCODONE-ACETAMINOPHEN 5-325 MG PO TABS
1.0000 | ORAL_TABLET | ORAL | Status: DC | PRN
  Administered 2011-09-03 – 2011-09-04 (×5): 2 via ORAL
  Administered 2011-09-05: 1 via ORAL
  Administered 2011-09-05: 2 via ORAL
  Administered 2011-09-05: 1 via ORAL
  Administered 2011-09-05: 2 via ORAL
  Administered 2011-09-06: 1 via ORAL
  Administered 2011-09-07 – 2011-09-08 (×2): 2 via ORAL
  Filled 2011-09-03: qty 1
  Filled 2011-09-03 (×5): qty 2
  Filled 2011-09-03: qty 1
  Filled 2011-09-03: qty 2
  Filled 2011-09-03: qty 1
  Filled 2011-09-03 (×2): qty 2
  Filled 2011-09-03: qty 1
  Filled 2011-09-03: qty 2

## 2011-09-03 MED ORDER — METHOCARBAMOL 500 MG PO TABS
500.0000 mg | ORAL_TABLET | Freq: Four times a day (QID) | ORAL | Status: DC | PRN
  Administered 2011-09-04 – 2011-09-07 (×5): 500 mg via ORAL
  Filled 2011-09-03 (×5): qty 1

## 2011-09-03 MED ORDER — SORBITOL 70 % SOLN
30.0000 mL | Freq: Every day | Status: DC | PRN
  Administered 2011-09-03: 30 mL via ORAL
  Filled 2011-09-03: qty 30

## 2011-09-03 MED ORDER — AMPICILLIN 500 MG PO CAPS
500.0000 mg | ORAL_CAPSULE | Freq: Four times a day (QID) | ORAL | Status: DC
  Administered 2011-09-03: 500 mg via ORAL
  Filled 2011-09-03 (×4): qty 1

## 2011-09-03 MED ORDER — ONDANSETRON HCL 4 MG PO TABS: 4.0000 mg | ORAL_TABLET | Freq: Four times a day (QID) | ORAL | Status: DC | PRN

## 2011-09-03 MED ORDER — AMOXICILLIN 250 MG PO CAPS
250.0000 mg | ORAL_CAPSULE | Freq: Three times a day (TID) | ORAL | Status: DC
  Administered 2011-09-03 – 2011-09-08 (×15): 250 mg via ORAL
  Filled 2011-09-03 (×18): qty 1

## 2011-09-03 MED ORDER — ACETAMINOPHEN 325 MG PO TABS: 325.0000 mg | ORAL_TABLET | ORAL | Status: DC | PRN

## 2011-09-03 MED ORDER — POLYETHYLENE GLYCOL 3350 17 G PO PACK
17.0000 g | PACK | Freq: Every day | ORAL | Status: DC | PRN
  Administered 2011-09-05: 17 g via ORAL
  Filled 2011-09-03 (×2): qty 1

## 2011-09-03 MED ORDER — ONDANSETRON HCL 4 MG/2ML IJ SOLN: 4.0000 mg | Freq: Four times a day (QID) | INTRAMUSCULAR | Status: DC | PRN

## 2011-09-03 NOTE — H&P (Signed)
Physical Medicine and Rehabilitation Admission H&P    No chief complaint on file. : HPI: [name] is a 41 y.o. female with history of scoliosis, abdominal pain with hysterectomy 4/13 and BV treated 5/13, admitted on 06/7/routine with abdominal pain with N/V. Treated with IVF. Patient reported urinary incontinence with BLE numbness and tingling with weakness on 06/08. MRI L-spine done revealing right adnexal lesion likely hematoma, central disc extrusion L5-S1 could encroach on S1 nerve root. Pelvic/transvaginal ultrasound without evidence of ovarian cyst. Patient developed CP with dyspnea with ?anxiety attack v/s aspiration PNA. Started on IV antibiotics as CXR with question of multifocal PNA. Evaluated by Dr. Darrelyn Hillock and patient underwent microdiskectomy at L5-S1 for decompressive laminectomy. Postoperative pain management. Routine back precautions. Venous Doppler studies lower extremity 08/30/2011 negative for DVT. Placed on amoxicillin for enterococcus UTI x7 days and Foley catheter tube recently removed. PT/OT evaluations done today revealing patient to have difficulty weight bearing and instability of RLE. MD, PT, OT recommending CIR. Patient was admitted for comprehensive rehabilitation program  Review of Systems  Eyes: Negative for blurred vision and double vision.  Respiratory: Negative for shortness of breath and wheezing. Sputum production: prior to admission.  Cardiovascular: Negative for chest pain.  Gastrointestinal: Positive for constipation.  Genitourinary: Negative for urgency and frequency.  Musculoskeletal: Positive for myalgias and back pain.  Neurological: Positive for tingling (right> left foot.). Negative for headaches   Past Medical History  Diagnosis Date  . DVT (deep venous thrombosis)     ? vs lupus  . Fibroids   . Anemia   . Headache   . Difficult intravenous access     hands are usually best for blood draws and IV starts  . Complication of anesthesia    slow to wake up  . Scoliosis    Past Surgical History  Procedure Date  . Neck surgery   . Cervical spine surgery   . Myomectomy   . Wisdom tooth extraction   . Cesarean section 1996  . Abdominal hysterectomy 06/29/11    Robotic Assisted Hysterectomy  . Lumbar laminectomy 08/31/2011    Procedure: MICRODISCECTOMY LUMBAR LAMINECTOMY;  Surgeon: Jacki Cones, MD;  Location: [facility];  Service: Orthopedics;  Laterality: N/A;  lumbar five sacral one central microdisectomy   Family History  Problem Relation Age of Onset  . Anesthesia problems Neg Hx   . Hypertension Maternal Grandmother    Social History:  reports that she has never smoked. She has never used smokeless tobacco. She reports that she does not drink alcohol or use illicit drugs. Allergies:  Allergies  Allergen Reactions  . Iodine Hives  . Shrimp (Shellfish Allergy) Hives   Medications Prior to Admission  Medication Sig Dispense Refill  . cholecalciferol (VITAMIN D) 1000 UNITS tablet Take 2,000 Units by mouth every morning.         Home:     Functional History:    Functional Status:  Mobility:          ADL:    Cognition: Cognition Orientation Level: Oriented X4     Blood pressure 108/72, pulse 93, temperature 98 F (36.7 C), temperature source Oral, resp. rate 20, height 5\' 4"  (1.626 m), weight 107.956 kg (238 lb), last menstrual period 02/27/2011, SpO2 100.00%. Physical Exam  Nursing note and vitals reviewed.  Constitutional: She is oriented to person, place, and time. She appears well-developed and well-nourished.  obese  HENT:  Head: Normocephalic and atraumatic.  Eyes: Pupils are equal, round, and  reactive to light.  Neck: Normal range of motion.  Cardiovascular: Normal rate and regular rhythm.  Pulmonary/Chest: Effort normal and breath sounds normal.  Abdominal: Soft. Bowel sounds are normal.  Musculoskeletal: She exhibits no edema and no tenderness.  Neurological: She is alert and oriented  to person, place, and time.  Decreased sensation RLE>LLE.  Skin: Skin is warm and dry. Lumbar spine incision without evidence of erythema, drainage, staples are intact  Sensation is reduced to light touch in right ankle foot and toes. Intact at the knees. Proprioception at the toes is intact  Motor strength is 5/5 in bilateral deltoid, biceps, triceps, grip, 4/5 in bilateral hip flexors,3 minus bilateral knee extensors, 3 minus bilateral ankle dorsiflexors and plantar flexors   Results for orders placed during the hospital encounter of 08/28/11 (from the past 48 hour(s))  CBC     Status: Abnormal   Collection Time   09/02/11  6:45 AM      Component Value Range Comment   WBC 8.6  4.0 - 10.5 K/uL    RBC 3.22 (*) 3.87 - 5.11 MIL/uL    Hemoglobin 9.4 (*) 12.0 - 15.0 g/dL    HCT 16.1 (*) 09.6 - 46.0 %    MCV 87.6  78.0 - 100.0 fL    MCH 29.2  26.0 - 34.0 pg    MCHC 33.3  30.0 - 36.0 g/dL    RDW 04.5  40.9 - 81.1 %    Platelets 236  150 - 400 K/uL   BASIC METABOLIC PANEL     Status: Abnormal   Collection Time   09/02/11  6:45 AM      Component Value Range Comment   Sodium 136  135 - 145 mEq/L    Potassium 3.2 (*) 3.5 - 5.1 mEq/L    Chloride 101  96 - 112 mEq/L    CO2 29  19 - 32 mEq/L    Glucose, Bld 124 (*) 70 - 99 mg/dL    BUN 7  6 - 23 mg/dL    Creatinine, Ser 9.14  0.50 - 1.10 mg/dL    Calcium 7.9 (*) 8.4 - 10.5 mg/dL    GFR calc non Af Amer >90  >90 mL/min    GFR calc Af Amer >90  >90 mL/min   CBC     Status: Abnormal   Collection Time   09/03/11  6:20 AM      Component Value Range Comment   WBC 7.9  4.0 - 10.5 K/uL    RBC 3.26 (*) 3.87 - 5.11 MIL/uL    Hemoglobin 9.6 (*) 12.0 - 15.0 g/dL    HCT 78.2 (*) 95.6 - 46.0 %    MCV 88.3  78.0 - 100.0 fL    MCH 29.4  26.0 - 34.0 pg    MCHC 33.3  30.0 - 36.0 g/dL    RDW 21.3  08.6 - 57.8 %    Platelets 227  150 - 400 K/uL   BASIC METABOLIC PANEL     Status: Abnormal   Collection Time   09/03/11  6:20 AM      Component Value  Range Comment   Sodium 134 (*) 135 - 145 mEq/L    Potassium 3.6  3.5 - 5.1 mEq/L    Chloride 98  96 - 112 mEq/L    CO2 29  19 - 32 mEq/L    Glucose, Bld 106 (*) 70 - 99 mg/dL    BUN 5 (*) 6 - 23  mg/dL    Creatinine, Ser 1.61  0.50 - 1.10 mg/dL    Calcium 8.3 (*) 8.4 - 10.5 mg/dL    GFR calc non Af Amer >90  >90 mL/min    GFR calc Af Amer >90  >90 mL/min    No results found.  Post Admission Physician Evaluation: 1. Functional deficits secondary  to cauda equina syndrome due to herniated disc with right greater than left lower extremity weakness and numbness as well as neurogenic bladder postoperative day #5. 2. Patient is admitted to receive collaborative, interdisciplinary care between the physiatrist, rehab nursing staff, and therapy team. 3. Patient's level of medical complexity and substantial therapy needs in context of that medical necessity cannot be provided at a lesser intensity of care such as a SNF. 4. Patient has experienced substantial functional loss from his/her baseline which was documented above under the "Functional History" and "Functional Status" headings.  Judging by the patient's diagnosis, physical exam, and functional history, the patient has potential for functional progress which will result in measurable gains while on inpatient rehab.  These gains will be of substantial and practical use upon discharge  in facilitating mobility and self-care at the household level. 5. Physiatrist will provide 24 hour management of medical needs as well as oversight of the therapy plan/treatment and provide guidance as appropriate regarding the interaction of the two. 6. 24 hour rehab nursing will assist with bladder management, bowel management, safety, skin/wound care, disease management, medication administration, pain management and patient education  and help integrate therapy concepts, techniques,education, etc. 7. PT will assess and treat for:  Pre-gait training gait training and  endurance safety equipment.  Goals are: modified independent with all mobility. 8. OT will assess and treat for: ADLs, equipment, safety, endurance.   Goals are: I find independent ADLs. 9. SLP will assess and treat for: not applicable.  Goals are: not applicable. 10. Case Management and Social Worker will assess and treat for psychological issues and discharge planning. 11. Team conference will be held weekly to assess progress toward goals and to determine barriers to discharge. 12. Patient will receive at least 3 hours of therapy per day at least 5 days per week. 13. ELOS: 7-10 days      Prognosis:  excellent   Medical Problem List and Plan: 1. 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 Status post decompressive laminectomy 2. DVT Prophylaxis/Anticoagulation: SCDs. Venous Doppler study 08/30/2011 negative 3. Pain Management: Robaxin, Percocet. Monitor with increased mobility 4. Enterococcus UTI. Amoxicillin x7 days 5. Hypokalemia. Followup labs   09/03/2011, 5:06 PM

## 2011-09-03 NOTE — PMR Pre-admission (Signed)
PMR Admission Coordinator Pre-Admission Assessment  Patient: [name] is an 41 y.o., female MRN: [id number] DOB: Sep 05, 1970 Height: 5\' 4"  (162.6 cm) Weight: 107.8 kg (237 lb 10.5 oz)  Insurance Information HMO:     PPO:      PCP:      IPA:      80/20:      OTHER: x PRIMARY: Medicaid      Policy#: [id number]      Subscriber: patient- dependents in home CM Name:       Phone#:      Fax#:  Pre-Cert#:       Employer: works as a Advice worker:  Phone #: [phone]     Name: automated line Eff. Date: 09/02/11     Deduct:       Out of Pocket Max:       Life Max:  CIR:       SNF:  Outpatient:      Co-Pay:  Home Health:      Co-Pay:  DME:      Co-Pay:  Providers:  SECONDARY:       Policy#:       Subscriber:  CM Name:       Phone#:      Fax#:  Pre-Cert#:      Employer:  Benefits:  Phone #:      Name:  Eff. Date:      Deduct:       Out of Pocket Max:       Life Max:  CIR:       SNF:  Outpatient:      Co-Pay:  Home Health:       Co-Pay:  DME:      Co-Pay:   Medicaid Application Date:       Case Manager:  Disability Application Date:       Case Worker:   Emergency Contact Information Contact Information    Name Relation Home Work Mobile   Huang,Kathy Mother [phone]     Current Medical History  Patient Admitting Diagnosis:cauda equina syndrome do to extruded L5-S1 disc status post decompressive laminectomy   History of Present Illness:[name] is a 41 y.o. female with history of scoliosis, abdominal pain with hysterectomy 4/13 and BV treated 5/13, admitted on 06/7/routine with abdominal pain with N/V. Treated with IVF. Patient reported urinary incontinence with BLE numbness and tingling with weakness on 06/08. MRI L-spine done revealing right adnexal lesion likely hematoma, central disc extrusion L5-S1 could encroach on S1 nerve root. Pelvic/transvaginal ultrasound without evidence of ovarian cyst. Patient developed CP with dyspnea with  ?anxiety attack v/s aspiration PNA. Started on IV antibiotics as CXR with question of multifocal PNA. Evaluated by Dr. Darrelyn Hillock and patient underwent microdiskectomy at L5-S1 for decompressive laminectomy. Postoperative pain management. Routine back precautions. Venous Doppler studies lower extremity 08/30/2011 negative for DVT. Placed on amoxicillin for enterococcus UTI x7 days and Foley catheter tube recently removed. PT/OT evaluations done today revealing patient to have difficulty weight bearing and instability of RLE. MD, PT, OT recommending CIR. Patient was admitted for comprehensive rehabilitation program        Past Medical History  Past Medical History  Diagnosis Date  . DVT (deep venous thrombosis)     ? vs lupus  . Fibroids   . Anemia   . Headache   . Difficult intravenous access     hands are usually best for blood draws and IV starts  . Complication  of anesthesia     slow to wake up  . Scoliosis     Family History  family history includes Hypertension in her maternal grandmother.  There is no history of Anesthesia problems.  Prior Rehab/Hospitalizations: no prior CIR stays   Current Medications  Current facility-administered medications:acetaminophen (TYLENOL) suppository 650 mg, 650 mg, Rectal, Q4H PRN, Jacki Cones, MD;  acetaminophen (TYLENOL) tablet 650 mg, 650 mg, Oral, Q4H PRN, Jacki Cones, MD;  ampicillin (OMNIPEN) 1 g in sodium chloride 0.9 % 50 mL IVPB, 1 g, Intravenous, Q6H, Berkley Harvey, PHARMD, 1 g at 09/03/11 0231;  bisacodyl (DULCOLAX) suppository 10 mg, 10 mg, Rectal, Daily PRN, Jacki Cones, MD diphenhydrAMINE (BENADRYL) capsule 25 mg, 25 mg, Oral, Q4H PRN, Kela Millin, MD, 25 mg at 09/03/11 1610;  HYDROcodone-acetaminophen (NORCO) 5-325 MG per tablet 1-2 tablet, 1-2 tablet, Oral, Q4H PRN, Jacki Cones, MD, 2 tablet at 09/02/11 0825;  HYDROmorphone (DILAUDID) injection 0.5-1 mg, 0.5-1 mg, Intravenous, Q2H PRN, Jacki Cones, MD,  1 mg at 09/03/11 9604 lactated ringers infusion, , Intravenous, Continuous, Jacki Cones, MD, Last Rate: 100 mL/hr at 09/01/11 0004;  levalbuterol (XOPENEX) nebulizer solution 0.63 mg, 0.63 mg, Nebulization, Q6H PRN, Ripudeep K Rai, MD;  menthol-cetylpyridinium (CEPACOL) lozenge 3 mg, 1 lozenge, Oral, PRN, Kela Millin, MD, 3 mg at 09/02/11 0932 methocarbamol (ROBAXIN) 500 mg in dextrose 5 % 50 mL IVPB, 500 mg, Intravenous, Q6H PRN, Jacki Cones, MD, 500 mg at 08/31/11 2015;  methocarbamol (ROBAXIN) tablet 500 mg, 500 mg, Oral, Q6H PRN, Jacki Cones, MD, 500 mg at 09/02/11 2241;  ondansetron (ZOFRAN) injection 4 mg, 4 mg, Intravenous, Q4H PRN, Jacki Cones, MD, 4 mg at 09/01/11 1011 oxyCODONE-acetaminophen (PERCOCET) 5-325 MG per tablet 1-2 tablet, 1-2 tablet, Oral, Q4H PRN, Jacki Cones, MD;  phenol (CHLORASEPTIC) mouth spray 1 spray, 1 spray, Mouth/Throat, PRN, Jacki Cones, MD;  polyethylene glycol (MIRALAX / GLYCOLAX) packet 17 g, 17 g, Oral, Daily PRN, Jacki Cones, MD, 17 g at 09/02/11 2133 potassium chloride SA (K-DUR,KLOR-CON) CR tablet 40 mEq, 40 mEq, Oral, Q4H, Adeline C Viyuoh, MD, 40 mEq at 09/02/11 1511;  DISCONTD: diphenhydrAMINE (BENADRYL) capsule 25 mg, 25 mg, Oral, Q6H PRN, Caroline More, NP, 25 mg at 09/02/11 0930;  DISCONTD: menthol-cetylpyridinium (CEPACOL) lozenge 3 mg, 1 lozenge, Oral, PRN, Jacki Cones, MD  Patients Current Diet: General  Precautions / Restrictions Precautions Precautions: Back Restrictions Weight Bearing Restrictions: No Other Position/Activity Restrictions: weakness/sensory changes R leg, prone to buckle   Prior Activity Level Community (5-7x/wk): active; worked PTA Journalist, newspaper / Equipment Home Assistive Devices/Equipment: None Home Adaptive Equipment: None  Prior Functional Level Prior Function Level of Independence: Independent Able to Take Stairs?: Yes Driving: Yes Vocation: Full time  employment Comments: hairdresser  Current Functional Level Cognition  Arousal/Alertness: Awake/alert Overall Cognitive Status: Appears within functional limits for tasks assessed/performed Orientation Level: Oriented X4    Extremity Assessment (includes Sensation/Coordination)  RUE ROM/Strength/Tone: WFL for tasks assessed (tested to 90 due to back precautions)  RLE ROM/Strength/Tone: Deficits RLE ROM/Strength/Tone Deficits: hip flexion, abd/ addcution 2+/5, quads 3/5 foot and andkle 4/5 (strength may be greater, but pt with pain in back with isometric tests.) pt feels right leg will buckle in standing RLE Sensation: Deficits;WFL - Light Touch;WFL - Proprioception RLE Sensation Deficits: pt reports hypersensitivity and decreased stereognosis below knees    ADLs  Eating/Feeding: Simulated;Independent Where Assessed - Eating/Feeding: Chair Grooming:  Simulated;Supervision/safety Where Assessed - Grooming: Unsupported sitting Upper Body Bathing: Simulated;Supervision/safety Where Assessed - Upper Body Bathing: Unsupported sitting Lower Body Bathing: Simulated;Moderate assistance (WITH LONG SPONGE) Lower Body Bathing: Patient Percentage:  (25%) Where Assessed - Lower Body Bathing: Supported sit to stand Upper Body Dressing: Simulated;Minimal assistance (LINES) Where Assessed - Upper Body Dressing: Unsupported sitting Lower Body Dressing: Performed;Simulated;Moderate assistance (WITH AE;; PERFORMED SOCK) Lower Body Dressing: Patient Percentage: 10% Where Assessed - Lower Body Dressing: Sopported sit to stand Toilet Transfer: Simulated;+2 Total assistance Toilet Transfer: Patient Percentage: 60% Toilet Transfer Method: Stand pivot (CHAIR TO BED) Toileting - Clothing Manipulation and Hygiene: Simulated;+2 Total assistance Toileting - Clothing Manipulation and Hygiene: Patient Percentage: 20% Where Assessed - Toileting Clothing Manipulation and Hygiene: Sit to stand from 3-in-1 or  toilet Equipment Used: Rolling walker (STEADY) Transfers/Ambulation Related to ADLs: Pt had difficulty advancing legs with transfer back to bed.   Did standing rest break before doing ADL activities ADL Comments: Pt recalled 3 back precautions functionally    Mobility  Bed Mobility: Rolling Right;Right Sidelying to Sit Rolling Right: 4: Min assist;With rail Right Sidelying to Sit: 3: Mod assist Sit to Sidelying Left: 3: Mod assist    Transfers  Transfers: Sit to Stand;Stand to Sit Sit to Stand: 4: Min assist Stand to Sit: 4: Min assist;To chair/3-in-1 Transfer via Lift Equipment: Stedy    Ambulation / Gait / Stairs / Psychologist, prison and probation services  Ambulation/Gait Ambulation/Gait Assistance: 1: +2 Total assist Ambulation/Gait: Patient Percentage: 60% Ambulation Distance (Feet): 8 Feet Assistive device: Rolling walker Ambulation/Gait Assistance Details: manual support to R knee to protect from buckling during stance. Manual  assitance for advancing R LE during swing for 75% of R  steps due to noted decreased ability to advance RLE, tends to flex knee w/ difficulty extending knee during swing. Also note that R foot is inverting during swing and stance. Gait Pattern: Step-through pattern;Decreased stance time - right;Decreased dorsiflexion - right;Right genu recurvatum Gait velocity: very slow and cautious Stairs: No Wheelchair Mobility Wheelchair Mobility: No    Posture / Balance Static Sitting Balance Static Sitting - Balance Support: No upper extremity supported Static Sitting - Level of Assistance: 7: Independent Static Sitting - Comment/# of Minutes: 3     Previous Home Environment Living Arrangements: Children (16yo son) Lives With: Family;Son (13 yo son) Available Help at Discharge: Family;Available PRN/intermittently Type of Home: House Home Layout: One level Home Access: Stairs to enter Entrance Stairs-Rails: Right;Left;Can reach both Entrance Stairs-Number of Steps: 3 steps in  garage; 5 at front [employer] Shower/Tub: Health visitor: Standard Bathroom Accessibility: Yes How Accessible: Accessible via walker Home Care Services: No  Discharge Living Setting Plans for Discharge Living Setting: Patient's home;House;Lives with (comment) (16yo son) Type of Home at Discharge: House Discharge Home Layout: One level Discharge Home Access: Stairs to enter Entrance Stairs-Rails: Right;Left;Can reach both Entrance Stairs-Number of Steps: 3 in garage; 5 at front Discharge Bathroom Shower/Tub: Walk-in shower Discharge Bathroom Toilet: Standard Discharge Bathroom Accessibility: Yes How Accessible: Accessible via walker Do you have any problems obtaining your medications?: No  Social/Family/Support Systems Patient Roles: Advertising account executive (employee) Contact Information: [phone] Anticipated Caregiver: Mother-Kathy and neice Breonna Anticipated Caregiver's Contact Information: mother cell [phone]; work [phone] Ability/Limitations of Caregiver: mother works, but will have intermittent supervision Caregiver Availability: Intermittent Discharge Plan Discussed with Primary Caregiver: Yes Is Caregiver In Agreement with Plan?: Yes Does Caregiver/Family have Issues with Lodging/Transportation while Pt is in Rehab?: No  Goals/Additional Needs Patient/Family Goal for Rehab:  S-Mod I PT; S-Mod I OT Expected length of stay: 7 days Cultural Considerations: none Dietary Needs: Regular diet Equipment Needs: to be determined Pt/Family Agrees to Admission and willing to participate: Yes Program Orientation Provided & Reviewed with Pt/Caregiver Including Roles  & Responsibilities: Yes  Patient Condition: This patient's condition remains as documented in the Consult dated 09/01/11 @ 1646, in which the Rehabilitation Physician determined and documented that the patient's condition is appropriate for intensive rehabilitative care in an inpatient rehabilitation  facility.  Preadmission Screen Completed By:  Oletta Darter, 09/03/2011 7:39 AM ______________________________________________________________________   Discussed status with Dr. Riley Kill on 6/13/13at 0730 and received telephone approval for admission today.  Admission Coordinator:  Oletta Darter, time0745/ Date 09/03/11

## 2011-09-03 NOTE — Progress Notes (Signed)
PT Cancellation Note  Treatment cancelled today due to pt planning to D/C to CIR today and has been up to [facility] this morning. Pt defers PT to CIR  Donnetta Hail 09/03/2011, 10:04 AM

## 2011-09-03 NOTE — Progress Notes (Signed)
OT Cancellation Note  Treatment cancelled today due to pt with plans to d/c to CIR today, MD d/c summary on chart.  Will defer ongoing OT to CIR.  Evern Bio 09/03/2011, 1:32 PM [phone]

## 2011-09-03 NOTE — Discharge Summary (Addendum)
Discharge Note  Name: [name] MRN: [id number] DOB: 11-Jun-1970 41 y.o.  Date of Admission: 08/28/2011  9:38 AM Date of Discharge: 09/03/2011 Attending Physician: Kela Millin, MD  Discharge Diagnosis: *Abdominal pain, acute, generalized Enterococcus species urinary tract infection Active Problems:  Lumbar herniated disc -postop day #3, status post decompressive lumbar laminectomy and microdiscectomy at L5-S1  ?DVT of right leg (deep venous thrombosis) in 01/2011-Doppler ultrasound on 6/9 /13with no evidence of DVT  Nausea and vomiting  Dehydration  S/P hysterectomy 4/13  Spinal stenosis of lumbar region   Discharge Medications: Medication List  As of 09/03/2011  9:32 AM   TAKE these medications         cholecalciferol 1000 UNITS tablet   Commonly known as: VITAMIN D   Take 2,000 Units by mouth every morning.       Marland Kitchen ampicillin  500 mg Oral Q6H  acetaminophen, bisacodyl, diphenhydrAMINE, HYDROcodone-acetaminophen, levalbuterol, menthol-cetylpyridinium, methocarbamol, ondansetron (ZOFRAN) IV, oxyCODONE-acetaminophen, phenol, polyethylene glycol, DISCONTD: acetaminophen, DISCONTD: diphenhydrAMINE, DISCONTD:  HYDROmorphone (DILAUDID) injection, DISCONTD: methocarbamol (ROBAXIN) IV    Disposition and follow-up:   Ms.[name] was discharged from [facility] in improve/stable condition.    Follow-up Appointments:  outpatient followup to be indicated per rehabilitation M.D. when she is discharged from CIR.  Consultations:    Procedures Performed:  Ct Abdomen Pelvis Wo Contrast  08/28/2011  **ADDENDUM** CREATED: 08/28/2011 17:06:10  Original report read by Dr. Constance Goltz.  Addendum by Dr. Purcell Mouton. Review of the study was requested by the emergency department at this patient who reportedly underwent total hysterectomy on 06/29/2011. On review of the pathology from that surgical procedure, there is no mention of ovarian tissue in the specimen. Therefore, the  right adnexal finding could be related to an ovarian remnant. Alternately, this could reflect a postsurgical finding.  As stated, pelvic ultrasound may be helpful for further evaluation as clinically warranted.  **END ADDENDUM** SIGNED BY: Gerrianne Scale, M.D.   08/28/2011  *RADIOLOGY REPORT*  Clinical Data: Central lower abdominal pain.  CT ABDOMEN AND PELVIS WITHOUT CONTRAST  Technique:  Multidetector CT imaging of the abdomen and pelvis was performed following the standard protocol without intravenous contrast.  Comparison: None.  Findings: Within the right aspect of the pelvis, there is a complex slightly dense 3.4 x 3.6 x 3.3 cm lesion which may represent a complex ovarian process possibly a hemorrhagic cyst.  Sonogram would prove helpful for further delineation.  There is a minimal amount of adjacent fluid which extends along the sigmoid colon. This may be related to the ovarian process rather than primary sigmoid colitis.  The cecum is displaced medially and superiorly.  No inflammation surrounds the appendix.  Scattered top normal size loops of small bowel without obstruction noted.  Evaluation of solid abdominal viscera is limited by lack of IV contrast.  Taking this limitation into account no focal hepatic, splenic, pancreatic, renal or adrenal lesion.  No calcified gallstones.  No free intraperitoneal air.  Minimal atelectatic changes lung bases.  No abdominal aortic aneurysm.  Small hiatal hernia.  Degenerative changes lower lumbar spine.  IMPRESSION: Within the right aspect of the pelvis, there is a complex slightly dense 3.4 x 3.6 x 3.3 cm lesion which may represent a complex ovarian process possibly a hemorrhagic cyst.  Sonogram would prove helpful for further delineation.  There is a minimal amount of adjacent fluid which extends along the sigmoid colon.  This may be related to the ovarian process rather than  primary sigmoid colitis.  The cecum is displaced medially and superiorly.  No  inflammation surrounds the appendix.  Scattered top normal size loops of small bowel without obstruction noted.  Original Report Authenticated By: Gerrianne Scale, M.D.   Mr Lumbar Spine W Wo Contrast  08/29/2011  *RADIOLOGY REPORT*  Clinical Data: Severe abdominal pain status post hysterectomy 2 months ago.  Now with bilateral leg numbness and urinary incontinence.  MRI LUMBAR SPINE WITHOUT AND WITH CONTRAST  Technique:  Multiplanar and multiecho pulse sequences of the lumbar spine were obtained without and with intravenous contrast.  Contrast: 20mL MULTIHANCE GADOBENATE DIMEGLUMINE 529 MG/ML IV SOLN  Comparison: Abdominal pelvic CT 08/28/2011.  Findings: There are five lumbar type vertebral bodies.  There is a mild convex right scoliosis.  No fracture or pars defect is present.  The conus medullaris extends to the upper L2 level and appears normal. There are no paraspinal abnormalities. There is no abnormal intradural enhancement.  As demonstrated on yesterday's CT, there is a right adnexal lesion measuring approximately 3.8 x 2.1 x 3.0 cm.  This demonstrates central high T1 signal, most consistent with a hematoma.  There is mild surrounding soft tissue edema.  There are no significant disc space findings from T11-T12 through L2-L3.  L3-L4:  Minimal disc bulge and mild facet hypertrophy.  No spinal stenosis or nerve root encroachment.  L4-L5:  Mild disc bulging and mild bilateral facet hypertrophy.  No spinal stenosis or nerve root encroachment.  L5-S1:  There is a moderate sized central disc extrusion with mild cranial extension of disc material within the ventral epidural space posterior to the L5 vertebral body.  This is largely contained within the ventral epidural fat but could encroach on either S1 nerve root.  There is no foraminal compromise or L5 nerve root encroachment.  There is mild bilateral facet hypertrophy.  IMPRESSION:  1.  Central disc extrusion at L5-S1 could encroach on either S1 nerve  root. 2.  No other significant disc space findings.  There is mild disc bulging at L3-L4 and L4-L5. 3.  The distal thoracic cord and conus medullaris appear normal. 4.  Probable right adnexal hematoma as demonstrated on CT yesterday.  If the patient still has a right ovary, this could reflect a hemorrhagic cyst.  If the ovaries were removed at hysterectomy, this could reflect a postoperative finding.  Original Report Authenticated By: Gerrianne Scale, M.D.   Nm Pulmonary Perfusion  08/30/2011  *RADIOLOGY REPORT*  Clinical Data: Elevated D-dimer.  Chest pain.  NM PULMONARY PERFUSION PARTICULATE  Radiopharmaceutical: CURIE MAA TECHNETIUM TO 13M ALBUMIN AGGREGATED  Comparison: Chest radiograph 08/30/2011  Findings: No focal pleural based defects are identified within either lung to suggest pulmonary embolism.  IMPRESSION: Low probability for pulmonary embolism.  Original Report Authenticated By: Britta Mccreedy, M.D.   US Transvaginal Non-ob  08/29/2011  *RADIOLOGY REPORT*  Clinical Data: 3.6 cm right adnexal lesion seen on noncontrast CT of the abdomen pelvis.  TRANSABDOMINAL AND TRANSVAGINAL ULTRASOUND OF PELVIS Technique:  Both transabdominal and transvaginal ultrasound examinations of the pelvis were performed. Transabdominal technique was performed for global imaging of the pelvis including uterus, ovaries, adnexal regions, and pelvic cul-de-sac.  Comparison: CT abdomen pelvis without contrast 08/28/2011.  Pelvic ultrasound 03/09/2011   It was necessary to proceed with endovaginal exam following the transabdominal exam to visualize the ovaries and adnexa.  Findings:  Uterus: Surgically absent.  Endometrium: Surgically absent.  Right ovary:  Right ovary measures 3.7 x 2.2  x 1.9 cm and has some simple appearing follicles, or potentially resolving cysts within it.  Appears within normal limits for a 41 year old patient. Color Doppler flow is identified to the right ovary.  Left ovary: Normal appearance/no  adnexal mass.  Measures 3.3 x 1.9 x 2.1 cm and contains a few small follicles. Color Doppler flow is identified to the left ovary.  Other findings: Trace amount of free pelvic fluid.  IMPRESSION:  1.  NoBoth ovaries are within normal limits for a 41 year old patient.  No suspicious findings are seen on ultrasound. 2.  Hysterectomy.  Original Report Authenticated By: Britta Mccreedy, M.D.   US Pelvis Complete  08/29/2011  *RADIOLOGY REPORT*  Clinical Data: 3.6 cm right adnexal lesion seen on noncontrast CT of the abdomen pelvis.  TRANSABDOMINAL AND TRANSVAGINAL ULTRASOUND OF PELVIS Technique:  Both transabdominal and transvaginal ultrasound examinations of the pelvis were performed. Transabdominal technique was performed for global imaging of the pelvis including uterus, ovaries, adnexal regions, and pelvic cul-de-sac.  Comparison: CT abdomen pelvis without contrast 08/28/2011.  Pelvic ultrasound 03/09/2011   It was necessary to proceed with endovaginal exam following the transabdominal exam to visualize the ovaries and adnexa.  Findings:  Uterus: Surgically absent.  Endometrium: Surgically absent.  Right ovary:  Right ovary measures 3.7 x 2.2 x 1.9 cm and has some simple appearing follicles, or potentially resolving cysts within it.  Appears within normal limits for a 41 year old patient. Color Doppler flow is identified to the right ovary.  Left ovary: Normal appearance/no adnexal mass.  Measures 3.3 x 1.9 x 2.1 cm and contains a few small follicles. Color Doppler flow is identified to the left ovary.  Other findings: Trace amount of free pelvic fluid.  IMPRESSION:  1.  NoBoth ovaries are within normal limits for a 41 year old patient.  No suspicious findings are seen on ultrasound. 2.  Hysterectomy.  Original Report Authenticated By: Britta Mccreedy, M.D.   Dg Chest Port 1 View  08/30/2011  *RADIOLOGY REPORT*  Clinical Data: Chest pain and dyspnea  PORTABLE CHEST - 1 VIEW  Comparison: 08/29/2011  Findings: The heart  size is within normal limits for portable technique.  Diffuse bilateral opacities, most prominent in the right upper lung and left lung base medially are suspicious for bilateral airspace disease.  The pulmonary vascularity is prominent, but may be accentuated by low lung volumes.  No visible pleural effusion.  No acute bony abnormality identified.  IMPRESSION: Bilateral airspace disease.  This could reflect multifocal pneumonia, aspiration, or less likely pulmonary edema.  Original Report Authenticated By: Britta Mccreedy, M.D.   Dg Chest Port 1 View  08/30/2011  *RADIOLOGY REPORT*  Clinical Data: Mid chest pain and fever.  PORTABLE CHEST - 1 VIEW  Comparison: 04/05/2011  Findings: Portable view of the chest demonstrates low lung volumes. There is vascular crowding in the hilar regions.  Focal density at the right lung base may represent atelectasis.  Trachea is midline. Heart size is accentuated by the technique and low lung volumes. Previous surgery in the lower cervical spine.  There are retrocardiac densities that could represent atelectasis.  IMPRESSION: Low lung volumes with bibasilar densities.  Findings may be related to atelectasis.  Lung bases could be better evaluated with a PA and lateral chest examination.  Original Report Authenticated By: Richarda Overlie, M.D.   Dg Spine Portable 1 View  08/31/2011  *RADIOLOGY REPORT*  Clinical Data: L5-S1 decompression, intraoperative  PORTABLE SPINE - 1 VIEW  Comparison: Portable exam #3 at [employer]  hours compared to 1805 hours  Findings: Prior MRI lumbar spine 08/29/2011 labeled with five lumbar vertebrae. Metallic probe via posterior approach projects over the posterior portion of the L5-S1 disc space.  IMPRESSION: Posterior localization of the L5-S1 disc space level by a metallic probe.  Original Report Authenticated By: Lollie Marrow, M.D.   Dg Spine Portable 1 View  08/31/2011  *RADIOLOGY REPORT*  Clinical Data: Disc protrusion at L5-S1.  PORTABLE SPINE - 1 VIEW   Comparison: MRI dated 08/29/2011  Findings: Instruments are at L5 S1.  IMPRESSION: Instruments at L5-S1.  Original Report Authenticated By: Gwynn Burly, M.D.   Dg Spine Portable 1 View  08/31/2011  *RADIOLOGY REPORT*  Clinical Data: Decompression at L5-S1.  PORTABLE SPINE - 1 VIEW  Comparison: MRI of 08/29/2011  Findings: Presuming the nomenclature of the 08/29/2011 MRI, surgical devices are identified projecting posterior to the upper sacrum.  Maintenance of the imaged vertebral body height.  Contrast is identified within the colon.  IMPRESSION: Intraoperative localization of the upper sacrum.  Original Report Authenticated By: Consuello Bossier, M.D.   Dg Abd Acute W/chest  08/28/2011  *RADIOLOGY REPORT*  Clinical Data: Abdominal pain, shortness of breath  ACUTE ABDOMEN SERIES (ABDOMEN 2 VIEW & CHEST 1 VIEW)  Comparison: Chest x-ray of 04/05/2011 and lumbar spine film of 07/09/2011  Findings: The lungs are poorly aerated and there is compression of markings at the lung bases.  No definite active process is seen. Heart size is normal.  A lower anterior cervical spine fusion plate is present.  Supine and left lateral decubitus films of the abdomen show both large and small bowel gas to be present.  No definite bowel obstruction is seen.  No free intraperitoneal air is noted.  No opaque calculi are seen.  IMPRESSION:  1.  Poor inspiration.  No active process. 2.  No evidence of bowel obstruction.  Both large and small bowel gas is present.  No free air.  Original Report Authenticated By: Juline Patch, M.D.    Lower extremity Doppler Summary:  - No evidence of deep vein or superficial thrombosis involving the right lower extremity and left lower extremity. - No evidence of Baker's cyst on the right or left.     Admission HPI  41 year old female who is a vague historian and sedated right now from pain medications who presents emergency department because of approximately 24 hours and generalized  abdominal pain. Initially she said this is new however looking at her records it seems as if she's got chronic abdominal pain which she recently had a total hysterectomy in April of 2013. She says that this pain is much different than the pain she is to have associated with her periods. This pain is generalized and constant with associated nausea and vomiting. She denies any diarrhea. Denies any fevers. She denies any pelvic pain. She did have a postoperative vaginal infection for which she was treated for BV by her gynecologist in May of 2013. She says her vaginal discharge has since resolved. She denies any localization of the abdominal pain in any specific quadrant. She feels like she's been very bloated for the last several days. He denies any blood in her vomit or any bowel movements. She denies any sick contacts. She currently says the pain is still 10 out of 10 however history patient dozes off frequently. She was admitted for further evaluation and management.  Hospital Course by problem list: Principal Problem:  *Abdominal pain, acute, generalized Enterococcus  species urinary tract infection Active Problems:  Lumbar herniated disc -postop day #3, status post decompressive lumbar laminectomy and microdiscectomy at L5-S1  ?DVT of right leg (deep venous thrombosis) in 01/2011-Doppler ultrasound on 6/9 /13with no evidence of DVT  Nausea and vomiting  Dehydration  S/P hysterectomy 4/13  Spinal stenosis of lumbar region  Bilateral lower extremity weakness with urine continence: From herniated disc L5-S1 with nerve root compression  - The patient reported urinary incontinence, lower back pain and bilateral lower extremity numbness and weakness on 6/8 and an MRI of lumbar spine and it showed central disc extrusion at L5-S1 could encroach on either S1 nerve root. Neurosurgery was consulted and Dr. Venetia Maxon stated no neurosurgical intervention was indicated. Orthopedics was consulted and Dr. Darrelyn Hillock saw  patient and his impression was that the MRI showed herniated nucleus propulsus and she was scheduled for surgery -decompressive lumbar laminectomy and microdiskectomy at L5-S1   on 6/10 and patient tolerated the procedure well. PT OT was consulted and followed patient in CIR was recommended and they evaluated patient and she'll be transferred today her for further rehabilitation. Dr. Darrelyn Hillock will follow patient at rehabilitation.  Enterococcus UTI:  Patient had a urinalysis/urine culture which grew enterococcus species sensitive to amoxicillin. She was initially treated with IV ampicillin, and was changed to oral ampicillin prior to transfer to rehabilitation to complete 7 more days. She has remained afebrile and hemodynamically stable, her last white cell count prior to discharge to rehabilitation was 8.0.  *Abdominal pain, acute, generalized: Resolved, likely referred from herniated disc pain and urinary tract infection as listed above  -CT abdomen and pelvis showed no acute intra-abdominal pathology however has complex slightly dense 3.4x3.6x3.3 lesion which may represent a complex ovarian process possibly a hemorrhagic cyst.  - Pelvic and transvaginal ultrasound also negative for any ovarian cyst/torsion  Nausea and vomiting: Resolved, Likely secondary to urinary tract infection. Tolerating by mouth swab Chest pain and dyspnea/hypoxia: Resolved ? Acute anxiety attack versus aspiration  - No EKG Changes, cardiac enzymes neg, Elevated D Dimer, so V/Q scan was done which was negative, Doppler US of LE negative for any DVT  - CXR done on 08/30/2011 suggested pulm edema vs multifocal PNA, however lungs clear.  S/P hysterectomy 4/13  Hypokalemia- resolved, her potassium was replaced in the hospital Questionable DVT of right leg (deep venous thrombosis) in 01/2011: Not on any anticoagulation, Doppler ultrasounds done yesterday are negative for any DVT   Discharge Vitals:  BP 123/79  Pulse 108  Temp  99.1 F (37.3 C) (Oral)  Resp 18  Ht 5\' 4"  (1.626 m)  Wt 107.8 kg (237 lb 10.5 oz)  BMI 40.79 kg/m2  SpO2 80%  LMP 02/27/2011  Discharge Labs:  Results for orders placed during the hospital encounter of 08/28/11 (from the past 24 hour(s))  CBC     Status: Abnormal   Collection Time   09/03/11  6:20 AM      Component Value Range   WBC 7.9  4.0 - 10.5 K/uL   RBC 3.26 (*) 3.87 - 5.11 MIL/uL   Hemoglobin 9.6 (*) 12.0 - 15.0 g/dL   HCT 11.9 (*) 14.7 - 82.9 %   MCV 88.3  78.0 - 100.0 fL   MCH 29.4  26.0 - 34.0 pg   MCHC 33.3  30.0 - 36.0 g/dL   RDW 56.2  13.0 - 86.5 %   Platelets 227  150 - 400 K/uL  BASIC METABOLIC PANEL     Status:  Abnormal   Collection Time   09/03/11  6:20 AM      Component Value Range   Sodium 134 (*) 135 - 145 mEq/L   Potassium 3.6  3.5 - 5.1 mEq/L   Chloride 98  96 - 112 mEq/L   CO2 29  19 - 32 mEq/L   Glucose, Bld 106 (*) 70 - 99 mg/dL   BUN 5 (*) 6 - 23 mg/dL   Creatinine, Ser 1.61  0.50 - 1.10 mg/dL   Calcium 8.3 (*) 8.4 - 10.5 mg/dL   GFR calc non Af Amer >90  >90 mL/min   GFR calc Af Amer >90  >90 mL/min   Time spent coordinating discharge: 45 minutes Signed: Genowefa Morga C 09/03/2011, 9:23 AM

## 2011-09-03 NOTE — Progress Notes (Signed)
Pt transferred to rehab from [facility]. Orientated to unit and rehab expectations. Provided therapy schedule and resource handout. Pt declined viewing fall video as she had previously viewed at [employer]. Pt's butterfly wish is to get back to work.

## 2011-09-03 NOTE — Progress Notes (Signed)
Subjective: Now has control of her bladder since Foley was Dcd.Still has decrease of her sensation in lowers but motor strength is improved. Will follow in Rehab   Objective: Vital signs in last 24 hours: Temp:  [97.9 F (36.6 C)-99.1 F (37.3 C)] 99.1 F (37.3 C) (06/13 0602) Pulse Rate:  [106-111] 108  (06/13 0602) Resp:  [16-20] 18  (06/13 0602) BP: (113-123)/(77-79) 123/79 mmHg (06/13 0602) SpO2:  [80 %-94 %] 80 % (06/13 0602)  Intake/Output from previous day: 06/12 0701 - 06/13 0700 In: 420 [P.O.:420] Out: 600 [Urine:600] Intake/Output this shift:     Basename 09/03/11 0620 09/02/11 0645  HGB 9.6* 9.4*    Basename 09/03/11 0620 09/02/11 0645  WBC 7.9 8.6  RBC 3.26* 3.22*  HCT 28.8* 28.2*  PLT 227 236    Basename 09/03/11 0620 09/02/11 0645  NA 134* 136  K 3.6 3.2*  CL 98 101  CO2 29 29  BUN 5* 7  CREATININE 0.70 0.75  GLUCOSE 106* 124*  CALCIUM 8.3* 7.9*   No results found for this basename: LABPT:2,INR:2 in the last 72 hours  Dorsiflexion/Plantar flexion intact  Assessment/Plan: To Rehab.   [name] A 09/03/2011, 7:30 AM

## 2011-09-04 DIAGNOSIS — G834 Cauda equina syndrome: Secondary | ICD-10-CM

## 2011-09-04 DIAGNOSIS — R5381 Other malaise: Secondary | ICD-10-CM

## 2011-09-04 DIAGNOSIS — Z5189 Encounter for other specified aftercare: Secondary | ICD-10-CM

## 2011-09-04 LAB — COMPREHENSIVE METABOLIC PANEL
ALT: 8 U/L (ref 0–35)
Alkaline Phosphatase: 57 U/L (ref 39–117)
CO2: 27 mEq/L (ref 19–32)
Chloride: 102 mEq/L (ref 96–112)
GFR calc Af Amer: >90 mL/min (ref >90)
Glucose, Bld: 100 mg/dL — ABNORMAL HIGH (ref 70–99)
Potassium: 3.6 mEq/L (ref 3.5–5.1)
Sodium: 139 mEq/L (ref 135–145)
Total Bilirubin: 0.2 mg/dL — ABNORMAL LOW (ref 0.3–1.2)
Total Protein: 6.2 g/dL (ref 6.0–8.3)

## 2011-09-04 LAB — DIFFERENTIAL
Eosinophils Absolute: 0.1 10*3/uL (ref 0.0–0.7)
Eosinophils Relative: 2 % (ref 0–5)
Lymphs Abs: 1.8 10*3/uL (ref 0.7–4.0)

## 2011-09-04 LAB — CBC
Hemoglobin: 9.9 g/dL — ABNORMAL LOW (ref 12.0–15.0)
MCHC: 32.8 g/dL (ref 30.0–36.0)
RBC: 3.46 MIL/uL — ABNORMAL LOW (ref 3.87–5.11)
WBC: 6.4 10*3/uL (ref 4.0–10.5)

## 2011-09-04 MED ORDER — GABAPENTIN 100 MG PO CAPS
100.0000 mg | ORAL_CAPSULE | Freq: Three times a day (TID) | ORAL | Status: DC
  Administered 2011-09-04 – 2011-09-08 (×13): 100 mg via ORAL
  Filled 2011-09-04 (×18): qty 1

## 2011-09-04 MED ORDER — TAB-A-VITE/IRON PO TABS
1.0000 | ORAL_TABLET | Freq: Every day | ORAL | Status: DC
  Administered 2011-09-04 – 2011-09-08 (×5): 1 via ORAL
  Filled 2011-09-04 (×7): qty 1

## 2011-09-04 NOTE — Care Management (Signed)
[facility] Individual Statement of Services  Patient Name:  [name]  Date:  09/04/2011  Welcome to the [facility].  Our goal is to provide you with an individualized program based on your diagnosis and situation, designed to meet your specific needs.  With this comprehensive rehabilitation program, you will be expected to participate in at least 3 hours of rehabilitation therapies Monday-Friday, with modified therapy programming on the weekends.  Your rehabilitation program will include the following services:  Physical Therapy (PT), Occupational Therapy (OT), 24 hour per day rehabilitation nursing, Therapeutic Recreaction (TR), Case Management (RN and Child psychotherapist), Rehabilitation Medicine, Nutrition Services and Pharmacy Services  Weekly team conferences will be held on  Tuesday  to discuss your progress.  Your RN Case Designer, television/film set will talk with you frequently to get your input and to update you on team discussions.  Team conferences with you and your family in attendance may also be held.  Expected length of stay: 7-10 days    Overall anticipated outcome: Modified Independent--occ Supervision  Depending on your progress and recovery, your program may change.  Your RN Case Estate agent will coordinate services and will keep you informed of any changes.  Your RN Sports coach and SW names and contact numbers are listed  below.  The following services may also be recommended but are not provided by the [facility]:   Driving Evaluations  Home Health Rehabiltiation Services  Outpatient Rehabilitatation [facility]  Vocational Rehabilitation   Arrangements will be made to provide these services after discharge if needed.  Arrangements include referral to agencies that provide these services.  Your insurance has been verified to be:  Medicaid Your primary doctor is:  Dr. Andi Devon  Pertinent information will be shared with your doctor and your insurance company.  Case Manager: Melanee Spry, [facility] [phone]  Social Worker:  [place], [place] [phone]  Information discussed with and copy given to patient by: Brock Ra, 09/04/2011, 2:15 PM

## 2011-09-04 NOTE — Evaluation (Signed)
Physical Therapy Assessment and Plan  Patient Details  Name: [name] MRN: [id number] Date of Birth: Dec 14, 1970  PT Diagnosis: Difficulty walking, Impaired sensation, Low back pain, Muscle weakness and Pain in low back and RLE Rehab Potential: Good ELOS: 7-10 LOS   Today's Date: 09/04/2011  Problem List:  Patient Active Problem List  Diagnosis  . Fibroid uterus  . Menometrorrhagia  . DVT of right leg (deep venous thrombosis) in 01/2011  . Abdominal pain, acute, generalized  . Nausea and vomiting  . Dehydration  . S/P hysterectomy 4/13  . Spinal stenosis of lumbar region  . Lumbar herniated disc  . UTI (urinary tract infection)  . Hypokalemia  . Cauda equina syndrome    Past Medical History:  Past Medical History  Diagnosis Date  . DVT (deep venous thrombosis)     ? vs lupus  . Fibroids   . Anemia   . Headache   . Difficult intravenous access     hands are usually best for blood draws and IV starts  . Complication of anesthesia     slow to wake up  . Scoliosis    Past Surgical History:  Past Surgical History  Procedure Date  . Neck surgery   . Cervical spine surgery   . Myomectomy   . Wisdom tooth extraction   . Cesarean section 1996  . Abdominal hysterectomy 06/29/11    Robotic Assisted Hysterectomy  . Lumbar laminectomy 08/31/2011    Procedure: MICRODISCECTOMY LUMBAR LAMINECTOMY;  Surgeon: Jacki Cones, MD;  Location: [facility];  Service: Orthopedics;  Laterality: N/A;  lumbar five sacral one central microdisectomy    Assessment & Plan Clinical Impression: Patient is a 41 y.o. year old female with history of scoliosis, abdominal pain with hysterectomy 4/13 and BV treated 5/13, admitted on 06/7/routine with abdominal pain with N/V. Treated with IVF. Patient reported urinary incontinence with BLE numbness and tingling with weakness on 06/08. MRI L-spine done revealing right adnexal lesion likely hematoma, central disc extrusion L5-S1 could encroach on S1  nerve root. Pelvic/transvaginal ultrasound without evidence of ovarian cyst. Patient developed CP with dyspnea with ?anxiety attack v/s aspiration PNA. Started on IV antibiotics as CXR with question of multifocal PNA. Evaluated by Dr. Darrelyn Hillock and patient underwent microdiskectomy at L5-S1 for decompressive laminectomy. Postoperative pain management. Routine back precautions. Venous Doppler studies lower extremity 08/30/2011 negative for DVT. Placed on amoxicillin for enterococcus UTI x7 days and Foley catheter tube recently removed. Patient has difficulty weight bearing and instability of RLE. Patient transferred to CIR on 09/03/2011 .    Patient currently requires min assist with mobility secondary to impaired timing and sequencing, decreased sensation, and muscle weakness and decreased standing balance and difficulty maintaining precautions.  Prior to hospitalization, patient was independent with mobility and lived with Son in a House home.  Home access is 3 STE; then threshold from porch through doorStairs to enter.  Patient will benefit from skilled PT intervention to maximize safe functional mobility, minimize fall risk and decrease caregiver burden for planned discharge home with intermittent assist.  Anticipate patient will benefit from follow up OP at discharge.  PT - End of Session Activity Tolerance: Decreased this session Endurance Deficit: Yes PT Assessment Rehab Potential: Good Barriers to Discharge: Decreased caregiver support PT Plan PT Frequency: 1-2 X/day, 60-90 minutes Estimated Length of Stay: 7-10 LOS PT Treatment/Interventions: Ambulation/gait training;Balance/vestibular training;Community reintegration;Discharge planning;Disease management/prevention;DME/adaptive equipment instruction;Functional mobility training;Neuromuscular re-education;Pain management;Patient/family education;Psychosocial support;Skin care/wound management;Splinting/orthotics;Stair training;Therapeutic  Activities;Therapeutic Exercise;UE/LE Strength  taining/ROM;UE/LE Coordination activities PT Recommendation Follow Up Recommendations: Outpatient PT Equipment Recommended: Rolling walker with 5" wheels  PT Evaluation Precautions/Restrictions Precautions Precautions: Back Precaution Comments: Pt able to recall 2/3 back precautions, cueing for "arching" Restrictions Weight Bearing Restrictions: No Other Position/Activity Restrictions: weakness/sensory changes RLE Home Living/Prior Functioning Home Living Lives With: Son Available Help at Discharge: Family;Available PRN/intermittently (niece, mom) Type of Home: House Home Access: Stairs to enter [employer] of Steps: 3 STE; then threshold from porch through door Entrance Stairs-Rails: Left Home Layout: One level Bathroom Shower/Tub: Health visitor: Standard Bathroom Accessibility: Yes How Accessible: Accessible via wheelchair;Accessible via walker Home Adaptive Equipment: None Prior Function Level of Independence: Independent with basic ADLs;Independent with homemaking with ambulation;Independent with gait;Independent with transfers Able to Take Stairs?: Yes Driving: Yes Vocation: Full time employment Oceanographer; paper route) Cognition Overall Cognitive Status: Appears within functional limits for tasks assessed Arousal/Alertness: Awake/alert Orientation Level: Oriented X4 Awareness: Appears intact Problem Solving: Appears intact Safety/Judgment: Appears intact Sensation Sensation Light Touch: Impaired by gross assessment (decreased sensation in RLE L4, L5 S1; otherwise equal/intact) Proprioception: Appears Intact Coordination Gross Motor Movements are Fluid and Coordinated: Yes Fine Motor Movements are Fluid and Coordinated: Yes Motor  Motor Motor: Within Functional Limits  Locomotion  Gait Gait: Yes Gait Pattern: Impaired Gait Pattern: Step-to pattern;Decreased step length -  right;Decreased step length - left;Decreased stride length;Decreased hip/knee flexion - right;Decreased hip/knee flexion - left;Decreased dorsiflexion - right;Shuffle;Narrow base of support Gait velocity: very slow and cautious Stairs / Additional Locomotion Stairs: Yes Stairs Assistance: 4: Min assist Stairs Assistance Details: Verbal cues for precautions/safety;Verbal cues for technique;Verbal cues for sequencing;Verbal cues for gait pattern Stair Management Technique: Two rails;Step to pattern;Forwards Corporate treasurer: Yes Wheelchair Assistance: 4: Administrator, sports Details: Verbal cues for sequencing;Verbal cues for technique;Verbal cues for Engineer, drilling: Both upper extremities Wheelchair Parts Management: Needs assistance  Trunk/Postural Assessment  Cervical Assessment Cervical Assessment: Within Film/video editor Assessment: Within Functional Limits Lumbar Assessment Lumbar Assessment: Exceptions to [facility] (incision with dressing intact; back precautions) Postural Control Postural Control: Within Functional Limits  Balance Balance Balance Assessed: Yes Static Sitting Balance Static Sitting - Balance Support: No upper extremity supported Static Sitting - Level of Assistance: 7: Independent Dynamic Sitting Balance Dynamic Sitting - Level of Assistance: 5: Stand by assistance Static Standing Balance Static Standing - Level of Assistance: 4: Min assist Dynamic Standing Balance Dynamic Standing - Level of Assistance: 4: Min assist Extremity Assessment  RLE Assessment RLE Assessment: Exceptions to [facility] RLE Strength RLE Overall Strength Comments: 4-/5 overall RLE; 3+/5 DF and PF LLE Assessment LLE Assessment: Exceptions to [facility] LLE Strength LLE Overall Strength Comments: 4/5 throughout; 4-/5 knee flexors  See FIM for current functional status Refer to Care Plan for Long Term  Goals  Recommendations for other services: None  Discharge Criteria: Patient will be discharged from PT if patient refuses treatment 3 consecutive times without medical reason, if treatment goals not met, if there is a change in medical status, if patient makes no progress towards goals or if patient is discharged from hospital.  The above assessment, treatment plan, treatment alternatives and goals were discussed and mutually agreed upon: by patient  Skilled Therapy Session 1: Time: 7:30-8:35 (65 min)   Individual session.  Pt denies any pain but reports decreased sensation along bottom of R foot.  Pt received sitting up on [facility], reporting that she asks to go to the bathroom when she "feels  pressure" but has yet to "have the urge" to go.  Pt is unaware.  Pt reports that she has started to keep track of time in order to help determine if she should go to the bathroom or not.  Pt transferred BSC-->w/c with min A.  Pt educated on w/c mobility, management, and parts.  Gait x 8' Mod HHA +2 without AD in controlled environment.  Pt presents with shuffling gait with decreased hip flexion, knee ext, and bilateral ankle DF and decreased step length (R<L) in step-to gait.  Pt educated on proper sit-->supine technique in order to follow back precautions, demonstrating ability to complete with min A.  Pt rolled to each side obeying back precautions.  Focused on standing balance with patient maintaining balance with NBOS (eyes open and closed) and tandem stance (eyes open and closed).  Increased postural sway with eyes closed with pt demonstrating appropriate postural and protective reactions. Leading RLE tandem stance time> LLE (~8 sec).  Gait x 69' with RW with cueing for increased RLE step length.  Pt reports feeling "pull" in L low back when performing step-to gait leading with RLE first and preferred to amb leading with LLE.  Pt completed stairs with Mod A, with cueing and education for proper technique and  sequencing.  Pt self propelled w/c back to room for improve w/c mobility and endurance and transferred to recliner, agreeing to sit up until next therapy session.  Pt was in good spirits and motivated throughout evaluation.  Skilled Therapy Session 2: Time: 1330-1415 (45 min)  Individual therapy session.  Pt c/o 6/10 "soreness" in left low back and buttocks.  Offered to notify RN for pain medication but pt reports she can tolerate it for now.  Pt reports walking all the way from room to gym and back with OT.  Pt exercised on Nustep x15 minutes at workload 4 for improved strength, endurance, cardiorespiratory fitness, and activity tolerance.  Remainder of treatment focused on dynamic gait with obstacle avoidance and stepping over items using RW.  Focused on standing balance on complaint surface in parallel bars, working on static balance with eyes closed and SLS with 1 UE support to improve postural reactions, ankle strength, and protective reactions.  Pt tol activity well but c/o increased pain in L low back with SLS on LLE. Returned to room and pt transferred to recliner with call bell in reach.  Deirdre Pippins 09/04/2011, 8:54 AM

## 2011-09-04 NOTE — Progress Notes (Signed)
Social Work  Social Work Assessment and Plan  Patient Details  Name: [name] MRN: [id number] Date of Birth: Aug 08, 1970  Today's Date: 09/04/2011  Problem List:  Patient Active Problem List  Diagnosis  . Fibroid uterus  . Menometrorrhagia  . DVT of right leg (deep venous thrombosis) in 01/2011  . Abdominal pain, acute, generalized  . Nausea and vomiting  . Dehydration  . S/P hysterectomy 4/13  . Spinal stenosis of lumbar region  . Lumbar herniated disc  . UTI (urinary tract infection)  . Hypokalemia  . Cauda equina syndrome   Past Medical History:  Past Medical History  Diagnosis Date  . DVT (deep venous thrombosis)     ? vs lupus  . Fibroids   . Anemia   . Headache   . Difficult intravenous access     hands are usually best for blood draws and IV starts  . Complication of anesthesia     slow to wake up  . Scoliosis    Past Surgical History:  Past Surgical History  Procedure Date  . Neck surgery   . Cervical spine surgery   . Myomectomy   . Wisdom tooth extraction   . Cesarean section 1996  . Abdominal hysterectomy 06/29/11    Robotic Assisted Hysterectomy  . Lumbar laminectomy 08/31/2011    Procedure: MICRODISCECTOMY LUMBAR LAMINECTOMY;  Surgeon: Jacki Cones, MD;  Location: [facility];  Service: Orthopedics;  Laterality: N/A;  lumbar five sacral one central microdisectomy   Social History:  reports that she has never smoked. She has never used smokeless tobacco. She reports that she does not drink alcohol or use illicit drugs.  Family / Support Systems Marital Status: Divorced How Long?: 2 years Patient Roles: Advertising account executive (employee) Children: pt has a 72 yr old son who is leaving this Sunday for a 6 week outward bound course  Anticipated Caregiver: Mother, [name], (C) [phone] and (W) [phone] Ability/Limitations of Caregiver: mother works, but will have intermittent supervision Caregiver Availability: Intermittent Family Dynamics: Pt  describes very supportive family but all limited just by their work schedules.  Pt notes her local aunt is not working and can be the person to check on her during day hours.  Mother willing to stay at night if needed.  Social History Preferred language: English Religion: Christian Cultural Background: NA Education: Earned AA degree at [employer] Read: Yes Write: Yes Employment Status: Employed Name of Employer: self-employed Producer, television/film/video - owns own Sports administrator of Employment: 20  Return to Work Plans: Pt hopeful she can return to work "by mid July" Fish farm manager Issues: none Guardian/Conservator: none   Abuse/Neglect Physical Abuse: Denies Verbal Abuse: Denies Sexual Abuse: Denies Exploitation of patient/patient's resources: Denies Self-Neglect: Denies  Emotional Status Pt's affect, behavior adn adjustment status: Very pleasant, talkative woman who shows a good understanding of her medical issues and with very positive, optimistic outlook for recovery.  Laughs easily with this SW throughout interview and denies and s/s of depression or anxiety.  Admits some concern about her return to work and the financial strains she may have, but is hopeful. Recent Psychosocial Issues: none Pyschiatric History: none Substance Abuse History: none  Patient / Family Perceptions, Expectations & Goals Pt/Family understanding of illness & functional limitations: Pt with good understanding of surgery performed and current functional limitations. 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, business owner Anticipated changes in roles/activities/participation: little change anticipated for the longer term Pt/family expectations/goals: Pt hopeful she can reach some level of  independence as she will be alone at home and to return to work in 5-6 weeks  [employer]: None Premorbid Home Care/DME Agencies: None Transportation available at discharge:  yes  Discharge Planning Living Arrangements: Children (16yo son) Support Systems: Parent;Other relatives Type of Residence: Private residence Insurance Resources: Medicaid (specify county) ([place] Financial Resources: Employment Surveyor, quantity Screen Referred: No Living Expenses: Database administrator Management: Patient Do you have any problems obtaining your medications?: No Home Management: pt and son Patient/Family Preliminary Plans: Pt plans to return to her own home with intermittent assist of family Social Work Anticipated Follow Up Needs: HH/OP Expected length of stay: 7 days  Clinical Impression Pleasant, fully oriented woman here after back surgery.  Very motivated for therapy and eventual return to work.  No s/s of emotional distress but will monitor.  [name] 09/04/2011, 1:47 PM

## 2011-09-04 NOTE — Evaluation (Signed)
Reviewed and in agreement with treatment provided.  

## 2011-09-04 NOTE — Evaluation (Signed)
Occupational Therapy Assessment and Plan & Session Notes  Patient Details  Name: [name] MRN: [id number] Date of Birth: 1971-03-07  OT Diagnosis: acute pain, lumbago (low back pain) and muscle weakness (generalized) Rehab Potential: Rehab Potential: Excellent ELOS: ~7 days   Today's Date: 09/04/2011  ASSESSMENT AND PLAN  Problem List:  Patient Active Problem List  Diagnosis  . Fibroid uterus  . Menometrorrhagia  . DVT of right leg (deep venous thrombosis) in 01/2011  . Abdominal pain, acute, generalized  . Nausea and vomiting  . Dehydration  . S/P hysterectomy 4/13  . Spinal stenosis of lumbar region  . Lumbar herniated disc  . UTI (urinary tract infection)  . Hypokalemia  . Cauda equina syndrome    Past Medical History:  Past Medical History  Diagnosis Date  . DVT (deep venous thrombosis)     ? vs lupus  . Fibroids   . Anemia   . Headache   . Difficult intravenous access     hands are usually best for blood draws and IV starts  . Complication of anesthesia     slow to wake up  . Scoliosis    Past Surgical History:  Past Surgical History  Procedure Date  . Neck surgery   . Cervical spine surgery   . Myomectomy   . Wisdom tooth extraction   . Cesarean section 1996  . Abdominal hysterectomy 06/29/11    Robotic Assisted Hysterectomy  . Lumbar laminectomy 08/31/2011    Procedure: MICRODISCECTOMY LUMBAR LAMINECTOMY;  Surgeon: Jacki Cones, MD;  Location: [facility];  Service: Orthopedics;  Laterality: N/A;  lumbar five sacral one central microdisectomy   Clinical Impression: [name] is a 41 y.o. female with history of scoliosis, abdominal pain with hysterectomy 4/13 and BV treated 5/13, admitted on 06/7/routine with abdominal pain with N/V. Treated with IVF. Patient reported urinary incontinence with BLE numbness and tingling with weakness on 06/08. MRI L-spine done revealing right adnexal lesion likely hematoma, central disc extrusion L5-S1 could  encroach on S1 nerve root. Pelvic/transvaginal ultrasound without evidence of ovarian cyst. Patient developed CP with dyspnea with ?anxiety attack v/s aspiration PNA. Started on IV antibiotics as CXR with question of multifocal PNA. Evaluated by Dr. Darrelyn Hillock and patient underwent microdiskectomy at L5-S1 for decompressive laminectomy. Postoperative pain management. Routine back precautions. Venous Doppler studies lower extremity 08/30/2011 negative for DVT. Placed on amoxicillin for enterococcus UTI x7 days and Foley catheter tube recently removed. Patient transferred to CIR on 09/03/2011 .    Patient currently requires min with basic self-care skills and IADL secondary to muscle weakness and decreased standing balance, decreased postural control, decreased balance strategies and difficulty maintaining precautions.  Prior to hospitalization, patient could complete ADLs & IADLs independently and driving independently.   Patient will benefit from skilled intervention to increase independence with basic self-care skills prior to discharge home with son and family members will be able to provide supervision intermittently.  Anticipate patient will require intermittent supervision and no further OT follow recommended.  OT - End of Session Activity Tolerance: Tolerates 30+ min activity with multiple rests Endurance Deficit: Yes OT Assessment Rehab Potential: Excellent Barriers to Discharge: None (none known) OT Plan OT Frequency: 1-2 X/day, 60-90 minutes Estimated Length of Stay: ~7 days OT Treatment/Interventions: Balance/vestibular training;Community reintegration;Discharge planning;DME/adaptive equipment instruction;Functional mobility training;Neuromuscular re-education;Pain management;Patient/family education;Psychosocial support;Self Care/advanced ADL retraining;Skin care/wound managment;Splinting/orthotics;Therapeutic Activities;Therapeutic Exercise;UE/LE Strength taining/ROM;UE/LE Coordination  activities;Wheelchair propulsion/positioning OT Recommendation Follow Up Recommendations: None Equipment Recommended: 3 in 1  bedside comode;Tub/shower seat  Precautions/Restrictions  Precautions Precautions: Back;Fall Precaution Comments: during OT eval patient able to recall 3/3 back precautions Restrictions Weight Bearing Restrictions: No Other Position/Activity Restrictions: weakness/sensory changes RLE  General Chart Reviewed: Yes Family/Caregiver Present: No  Pain Pain Assessment Pain Assessment: 0-10 Pain Score:   6 Faces Pain Scale: No hurt Pain Type: Surgical pain Pain Location: Back Pain Orientation: Mid;Lower Pain Descriptors: Aching Pain Intervention(s): RN made aware  Home Living/Prior Functioning Home Living Lives With: Son (46  years old) Available Help at Discharge: Family;Available PRN/intermittently Type of Home: House Home Access: Stairs to enter [employer] of Steps: 3 STE; then threshold from porch through door Entrance Stairs-Rails: Left Home Layout: One level Bathroom Shower/Tub: Walk-in shower;Door [employer] Toilet: Standard Bathroom Accessibility: Yes How Accessible: Accessible via wheelchair;Accessible via walker Home Adaptive Equipment: Built-in shower seat IADL History Homemaking Responsibilities: Yes Meal Prep Responsibility: Primary Laundry Responsibility: Primary Cleaning Responsibility: Primary Current License: Yes Type of Occupation: Hairdresser Prior Function Level of Independence: Independent with basic ADLs;Independent with homemaking with ambulation;Independent with gait;Independent with transfers Able to Take Stairs?: Yes Driving: Yes Vocation: Full time employment Oceanographer; paper route)  ADL - See FIM  Vision/Perception  Vision - History Baseline Vision: No visual deficits Patient Visual Report: No change from baseline Vision - Assessment Eye Alignment: Within Functional Limits Perception Perception:  Within Functional Limits Praxis Praxis: Intact   Cognition Overall Cognitive Status: Appears within functional limits for tasks assessed Arousal/Alertness: Awake/alert Orientation Level: Oriented X4 Memory: Appears intact Awareness: Appears intact Problem Solving: Appears intact Safety/Judgment: Appears intact  Sensation Sensation Light Touch: Impaired by gross assessment (decreased sensation in RLE L4, L5 S1; otherwise equal/intact) Proprioception: Appears Intact Additional Comments: UEs appear intact Coordination Gross Motor Movements are Fluid and Coordinated: Yes Fine Motor Movements are Fluid and Coordinated: Yes  Motor  Motor Motor: Within Functional Limits  Trunk/Postural Assessment  Cervical Assessment Cervical Assessment: Within Functional Limits Thoracic Assessment Thoracic Assessment: Within Functional Limits Lumbar Assessment Lumbar Assessment: Exceptions to [facility] (incision with dressing intact; back precautions) Postural Control Postural Control: Within Functional Limits   Balance Balance Balance Assessed: Yes Static Sitting Balance Static Sitting - Balance Support: No upper extremity supported Static Sitting - Level of Assistance: 7: Independent Dynamic Sitting Balance Dynamic Sitting - Level of Assistance: 5: Stand by assistance Static Standing Balance Static Standing - Level of Assistance: 4: Min assist Dynamic Standing Balance Dynamic Standing - Level of Assistance: 4: Min assist  Extremity/Trunk Assessment RUE Assessment RUE Assessment: Within Functional Limits (strength grossly 5/5) LUE Assessment LUE Assessment: Within Functional Limits (strength grossly 4+/5)  See FIM for current functional status  Refer to Care Plan for Long Term Goals  Recommendations for other services: None  Discharge Criteria: Patient will be discharged from OT if patient refuses treatment 3 consecutive times without medical reason, if treatment goals not met, if  there is a change in medical status, if patient makes no progress towards goals or if patient is discharged from hospital.  The above assessment, treatment plan, treatment alternatives and goals were discussed and mutually agreed upon: by patient  SESSION NOTES  Session #1 1030-1130 - 60 Minutes Individual Therapy Patient with 6/10 pain in back; RN aware Initial 1:1 occupational therapy completed. Focused skilled intervention on ADL retraining at sink level, overall activity tolerance/endurance, sit/stands, functional ambulation using rolling walker, toilet transfer onto BSC, adhering to back precautions during functional tasks, and grooming tasks at sink level. Patient left seated in recliner with BLEs  elevated and call bell & phone within reach.   Session #2 1300-1330 - 30 Minutes Individual Therapy No complaints of pain  Skilled intervention focusing on functional ambulation throughout room and hallway using rolling walker, toilet transfer, toileting (peri care & clothing management), simulated walk-in shower transfer (simulated like at home), and overall activity tolerance/endurance. Also discussed safety throughout the house with use of a rolling walker, energy conservation while she works, and adhering to back precautions during all tasks. Patient left in gym for next therapy session.   Brevin Mcfadden 09/04/2011, 11:44 AM

## 2011-09-04 NOTE — Plan of Care (Signed)
Overall Plan of Care [facility]) Patient Details Name: [name] MRN: [id number] DOB: Jan 22, 1971  Diagnosis:  L5-S1 HNP with stenosis and myelopathy, Cauda Equina syndrome  Primary Diagnosis:    Cauda equina syndrome Co-morbidities: neurogenic bowel, pain, UTI, hypokalemia  Functional Problem List  Patient demonstrates impairments in the following areas: Balance, Bladder, Bowel, Endurance, Motor, Nutrition, Pain, Sensory  and Skin Integrity  Basic ADL's: grooming, bathing, dressing and toileting Advanced ADL's: simple meal preparation and light housekeeping  Transfers:  bed mobility, bed to chair, toilet, tub/shower, car and furniture Locomotion:  ambulation, wheelchair mobility and stairs  Additional Impairments:  Leisure Awareness  Anticipated Outcomes Item Anticipated Outcome  Eating/Swallowing    Basic self-care  Mod I  Tolieting  Mod I  Bowel/Bladder    Transfers  Mod I  Locomotion  Mod I gait; supervision stairs  Communication    Cognition    Pain    Safety/Judgment    Other     Therapy Plan: PT Frequency: 1-2 X/day, 60-90 minutes OT Frequency: 1-2 X/day, 60-90 minutes     Team Interventions: Item RN PT OT SLP SW TR Other  Self Care/Advanced ADL Retraining  x x      Neuromuscular Re-Education  x x      Therapeutic Activities  x x      UE/LE Strength Training/ROM  x x      UE/LE Coordination Activities  x x      Visual/Perceptual Remediation/Compensation         DME/Adaptive Equipment Instruction  x x      Therapeutic Exercise  x x      Balance/Vestibular Training  x x      Patient/Family Education x x x      Cognitive Remediation/Compensation         Functional Mobility Training  x x      Ambulation/Gait Training  x x      Stair Training  x       Wheelchair Propulsion/Positioning  x x      Functional Tourist information centre manager Reintegration  x x      Dysphagia/Aspiration Musician         Bladder Management x        Bowel Management x        Disease Management/Prevention         Pain Management x x x      Medication Management         Skin Care/Wound Management x x x      Splinting/Orthotics  x x      Discharge Planning  x x      Psychosocial Support  x x                         Team Discharge Planning: Destination:  Home Projected Follow-up:  PT and Outpatient, None for OT Projected Equipment Needs:  Walker, [facility] Patient/family involved in discharge planning:  Yes  MD ELOS: 7-10 Medical Rehab Prognosis:  Excellent Assessment: Pt has been admitted for CIR therapies. The team will be focusing on fxnl mobility, NMR, safety, ADl's, bowel and bladder mgt, pain, and adaptive equipment. Goals are set at mod I. Pt is quite motivated.

## 2011-09-04 NOTE — Plan of Care (Signed)
Problem: RH BOWEL ELIMINATION Goal: RH STG MANAGE BOWEL W/MEDICATION W/ASSISTANCE STG Manage Bowel with Medication with Assistance. Mod I  Outcome: Not Progressing Pt. Has not had BM in several days; medication admin.

## 2011-09-04 NOTE — Progress Notes (Signed)
Patient ID: [name], female   DOB: 04-15-1970, 41 y.o.   MRN: [id number] Subjective/Complaints: Slept fairly well. Emptied bladder once. Having flatus but no bm. Pain under control with meds although she complains of dysesthesias in right leg  Objective: Vital Signs: Blood pressure 119/82, pulse 84, temperature 98.5 F (36.9 C), temperature source Oral, resp. rate 20, height 5\' 4"  (1.626 m), weight 107.956 kg (238 lb), last menstrual period 02/27/2011, SpO2 95.00%. No results found.  Basename 09/03/11 0620 09/02/11 0645  WBC 7.9 8.6  HGB 9.6* 9.4*  HCT 28.8* 28.2*  PLT 227 236    Basename 09/03/11 0620 09/02/11 0645  NA 134* 136  K 3.6 3.2*  CL 98 101  CO2 29 29  GLUCOSE 106* 124*  BUN 5* 7  CREATININE 0.70 0.75  CALCIUM 8.3* 7.9*   CBG (last 3)  No results found for this basename: GLUCAP:3 in the last 72 hours  Wt Readings from Last 3 Encounters:  09/03/11 107.956 kg (238 lb)  08/28/11 107.8 kg (237 lb 10.5 oz)  08/28/11 107.8 kg (237 lb 10.5 oz)    Physical Exam:  General appearance: alert, cooperative and no distress Head: Normocephalic, without obvious abnormality, atraumatic Eyes: conjunctivae/corneas clear. PERRL, EOM's intact. Fundi benign. Ears: normal TM's and external ear canals both ears Nose: Nares normal. Septum midline. Mucosa normal. No drainage or sinus tenderness. Throat: lips, mucosa, and tongue normal; teeth and gums normal Neck: no adenopathy, no carotid bruit, no JVD, supple, symmetrical, trachea midline and thyroid not enlarged, symmetric, no tenderness/mass/nodules Back: symmetric, no curvature. ROM normal. No CVA tenderness. Resp: clear to auscultation bilaterally Cardio: regular rate and rhythm, S1, S2 normal, no murmur, click, rub or gallop GI: soft, non-tender; bowel sounds normal; no masses,  no organomegaly Extremities: extremities normal, atraumatic, no cyanosis or edema Pulses: 2+ and symmetric Skin: Skin color, texture, turgor  normal. No rashes or lesions Neurologic: L4, L5, S1, S2 weakness right greater than left.  (1-2/5 vs 2-3/5)_Sensation 1/2 right more affected than left also. dtr's decreased.  Incision/Wound: clean and intact   Assessment/Plan: 1. Functional deficits secondary to Cauda Equina Syndrome secondary to L5-S1 disk which require 3+ hours per day of interdisciplinary therapy in a comprehensive inpatient rehab setting. Physiatrist is providing close team supervision and 24 hour management of active medical problems listed below. Physiatrist and rehab team continue to assess barriers to discharge/monitor patient progress toward functional and medical goals. FIM:       FIM - Toileting Toileting steps completed by patient: Adjust clothing prior to toileting;Adjust clothing after toileting;Performs perineal hygiene Toileting: 4: Steadying assist  FIM - Diplomatic Services operational officer Devices: Bedside commode Toilet Transfers: 4-To toilet/BSC: Min A (steadying Pt. > 75%);4-From toilet/BSC: Min A (steadying Pt. > 75%)        Comprehension Comprehension Mode: Auditory Comprehension: 7-Follows complex conversation/direction: With no assist  Expression Expression Mode: Verbal Expression: 7-Expresses complex ideas: With no assist  Social Interaction Social Interaction: 7-Interacts appropriately with others - No medications needed.  Problem Solving Problem Solving: 7-Solves complex problems: Recognizes & self-corrects  Memory Memory: 7-Complete Independence: No helper  1. 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 Status post decompressive laminectomy  2. DVT Prophylaxis/Anticoagulation: SCDs. Venous Doppler study 08/30/2011 negative  3. Pain Management: Robaxin, Percocet. May need scheduled  -add neurontin for neuropathic pain 4. Enterococcus UTI. Amoxicillin x7 days  5. Hypokalemia. Followup labs 6. ABLA- multivitamin 7. Neurogenic bowel and bladder- continue  training. Stool softener/laxative  LOS (Days) 1 A FACE TO FACE EVALUATION WAS PERFORMED  Cythia Bachtel T 09/04/2011, 7:02 AM

## 2011-09-04 NOTE — Progress Notes (Signed)
Patient information reviewed and entered into [facility] system by Bandy Honaker, RN, CRRN, PPS Coordinator.  Information including medical coding and functional independence measure will be reviewed and updated through discharge.    

## 2011-09-05 LAB — CULTURE, BLOOD (ROUTINE X 2)
Culture  Setup Time: 201306091122
Culture: NO GROWTH
Culture: NO GROWTH

## 2011-09-05 NOTE — Progress Notes (Signed)
Subjective/Complaints: Slept fairly well. Emptied bladder once. Having flatus but no bm still . Dysesthesias better in legs. Objective: Vital Signs: Blood pressure 124/81, pulse 84, temperature 98.6 F (37 C), temperature source Oral, resp. rate 18, height 5\' 4"  (1.626 m), weight 107.956 kg (238 lb), last menstrual period 02/27/2011, SpO2 97.00%. No results found.  Basename 09/04/11 0630 09/03/11 0620  WBC 6.4 7.9  HGB 9.9* 9.6*  HCT 30.2* 28.8*  PLT 239 227    Basename 09/04/11 0630 09/03/11 0620  NA 139 134*  K 3.6 3.6  CL 102 98  CO2 27 29  GLUCOSE 100* 106*  BUN 9 5*  CREATININE 0.74 0.70  CALCIUM 8.3* 8.3*   CBG (last 3)  No results found for this basename: GLUCAP:3 in the last 72 hours  Wt Readings from Last 3 Encounters:  09/03/11 107.956 kg (238 lb)  08/28/11 107.8 kg (237 lb 10.5 oz)  08/28/11 107.8 kg (237 lb 10.5 oz)    Physical Exam:  General appearance: alert, cooperative and no distress Head: Normocephalic, without obvious abnormality, atraumatic Eyes: conjunctivae/corneas clear. PERRL, EOM's intact. Fundi benign. Ears: normal TM's and external ear canals both ears Nose: Nares normal. Septum midline. Mucosa normal. No drainage or sinus tenderness. Throat: lips, mucosa, and tongue normal; teeth and gums normal Neck: no adenopathy, no carotid bruit, no JVD, supple, symmetrical, trachea midline and thyroid not enlarged, symmetric, no tenderness/mass/nodules Back: symmetric, no curvature. ROM normal. No CVA tenderness. Resp: clear to auscultation bilaterally Cardio: regular rate and rhythm, S1, S2 normal, no murmur, click, rub or gallop GI: soft, non-tender; bowel sounds normal; no masses,  no organomegaly Extremities: extremities normal, atraumatic, no cyanosis or edema Pulses: 2+ and symmetric Skin: Skin color, texture, turgor normal. No rashes or lesions Neurologic: L4, L5, S1, S2 weakness right greater than left.  (1-2/5 vs 2-3/5)_Sensation 1/2 right  more affected than left also. dtr's decreased.  Incision/Wound: clean and intact   Assessment/Plan: 1. Functional deficits secondary to Cauda Equina Syndrome secondary to L5-S1 disk which require 3+ hours per day of interdisciplinary therapy in a comprehensive inpatient rehab setting. Physiatrist is providing close team supervision and 24 hour management of active medical problems listed below. Physiatrist and rehab team continue to assess barriers to discharge/monitor patient progress toward functional and medical goals. FIM: FIM - Bathing Bathing Steps Patient Completed: Chest;Right Arm;Left Arm;Abdomen;Front perineal area;Right upper leg;Left upper leg;Right lower leg (including foot);Left lower leg (including foot) Bathing: 4: Min-Patient completes 8-9 28f 10 parts or 75+ percent  FIM - Upper Body Dressing/Undressing Upper body dressing/undressing steps patient completed: Thread/unthread right bra strap;Thread/unthread left bra strap;Hook/unhook bra;Thread/unthread right sleeve of pullover shirt/dresss;Thread/unthread left sleeve of pullover shirt/dress;Put head through opening of pull over shirt/dress;Pull shirt over trunk Upper body dressing/undressing: 5: Supervision: Safety issues/verbal cues FIM - Lower Body Dressing/Undressing Lower body dressing/undressing steps patient completed: Thread/unthread right underwear leg;Thread/unthread left underwear leg;Pull underwear up/down;Thread/unthread right pants leg;Thread/unthread left pants leg;Pull pants up/down;Don/Doff right sock;Don/Doff left sock;Don/Doff right shoe;Don/Doff left shoe;Fasten/unfasten right shoe;Fasten/unfasten left shoe Lower body dressing/undressing: 4: Steadying Assist  FIM - Toileting Toileting steps completed by patient: Adjust clothing prior to toileting;Performs perineal hygiene;Adjust clothing after toileting Toileting: 4: Steadying assist  FIM - Diplomatic Services operational officer Devices: Forensic psychologist Transfers: 4-To toilet/BSC: Min A (steadying Pt. > 75%);4-From toilet/BSC: Min A (steadying Pt. > 75%)  FIM - Bed/Chair Transfer Bed/Chair Transfer: 4: Supine > Sit: Min A (steadying Pt. > 75%/lift 1 leg);4: Sit > Supine: Min  A (steadying pt. > 75%/lift 1 leg);4: Bed > Chair or W/C: Min A (steadying Pt. > 75%);4: Chair or W/C > Bed: Min A (steadying Pt. > 75%) (req cues to following back precautions with siit<>supine)  FIM - Locomotion: Wheelchair Locomotion: Wheelchair: 1: Total Assistance/staff pushes wheelchair (Pt<25%) FIM - Locomotion: Ambulation Locomotion: Ambulation Assistive Devices: Designer, industrial/product Ambulation/Gait Assistance: 4: Min assist Locomotion: Ambulation: 2: Travels 50 - 149 ft with minimal assistance (Pt.>75%)  Comprehension Comprehension Mode: Auditory Comprehension: 7-Follows complex conversation/direction: With no assist  Expression Expression Mode: Verbal Expression: 7-Expresses complex ideas: With no assist  Social Interaction Social Interaction: 7-Interacts appropriately with others - No medications needed.  Problem Solving Problem Solving: 7-Solves complex problems: Recognizes & self-corrects  Memory Memory: 7-Complete Independence: No helper  1. 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 Status post decompressive laminectomy  2. DVT Prophylaxis/Anticoagulation: SCDs. Venous Doppler study 08/30/2011 negative  3. Pain Management: Robaxin, Percocet. May need scheduled  -added neurontin for neuropathic pain which has helped quite a bit. 4. Enterococcus UTI. Amoxicillin x7 days  5. Hypokalemia. Followup labs 6. ABLA- multivitamin 7. Neurogenic bowel and bladder- continue training. Stool softener/laxative  -we need to get bowels moving today- pt understands   LOS (Days) 2 A FACE TO FACE EVALUATION WAS PERFORMED  [name] T 09/05/2011, 7:54 AM

## 2011-09-05 NOTE — Plan of Care (Signed)
Problem: RH SKIN INTEGRITY Goal: RH STG ABLE TO PERFORM INCISION/WOUND CARE W/ASSISTANCE STG Able To Perform Incision/Wound Care With Assistance.  Outcome: Not Progressing Pt. Unable to reach back for wound care.

## 2011-09-05 NOTE — Progress Notes (Signed)
Physical Therapy Session Note  Patient Details  Name: [name] MRN: [id number] Date of Birth: 11/23/1970  Today's Date: 09/05/2011 Time: 1308-6578 Time Calculation (min): 45 min  Short Term Goals: Week 1:  PT Short Term Goal 1 (Week 1): =LTG  Therapy Documentation Precautions:  Precautions Precautions: Back;Fall Precaution Comments: during OT eval patient able to recall 3/3 back precautions Restrictions Weight Bearing Restrictions: No Other Position/Activity Restrictions: weakness/sensory changes RLE Pain: Pain Assessment Pain Assessment: 0-10 Pain Score: 0-No pain Pain Type: Surgical pain Pain Location: Back Pain Orientation: Mid;Lower Pain Descriptors: Sore Pain Frequency: Intermittent Pain Intervention(s): Medication (See eMAR)  Therapeutic Activity: (15') Transfer training; car transfers and sit-stand transfers with and without RW with S/Independent assist  Gait Training: (30') Using RW 2 x 300' with S/Mod-I  And 1 x 100' without walker in controlled environment with S/Independent and 1 x 30' outdoors on brick surface S/Independent.  Therapy/Group: Individual Therapy  Rex Kras 09/05/2011, 4:37 PM

## 2011-09-05 NOTE — Progress Notes (Signed)
Occupational Therapy Session Note  Patient Details  Name: [name] MRN: [id number] Date of Birth: 1970/05/18  Today's Date: 09/05/2011 Time: 1345-1430 and10-10:45 Time Calculation (min): 45 min +45 min=90  Skilled Therapeutic Interventions/Progress Updates:   AM session:ADL standing and sitting in w/c at sink with focus on adhering to BAT precautions.  Patient able to adhere with using middle of long towel to wash feet.  Patient stated she would like to purchase sponge and LH reacher for back, feet and picking up items off the floor.  PM Session:  Laundry and folding clothes RW level  Adhered to BAT precautions.  Also practiced using reacher to reacher access lower leveled needs (i.e. Drawers, etc)  Therapy Documentation Precautions:  Precautions Precautions: Back;Fall Precaution Comments: during OT eval patient able to recall 3/3 back precautions Restrictions Weight Bearing Restrictions: No Other Position/Activity Restrictions: weakness/sensory changes RLE   Pain: denied  See FIM for current functional status  Therapy/Group: Individual Therapy  Bud Face [facility] 09/05/2011, 2:55 PM

## 2011-09-05 NOTE — Progress Notes (Signed)
Physical Therapy Session Note  Patient Details  Name: [name] MRN: [id number] Date of Birth: 14-Nov-1970  Today's Date: 09/05/2011 Time: 2440-1027 Time Calculation (min): 60 min  Short Term Goals: Week 1:  PT Short Term Goal 1 (Week 1): =LTG  Therapy Documentation Precautions:  Back precautions  Pain: 5/10 L-spine  Therapeutic Exercise:(15')B LE's in sitting Therapeutic Activity: (30') Transfer training; supine to sit via Left and Right side lying/logroll, sit<->stand S/Mod-I and later S/Indepedent  Gait Training: (15') Using RW 2 x 200' with S/Mod-I and 1 x 40' inside room without RW S/Independent.  See FIM for current functional status  Therapy/Group: Individual Therapy  Lynnette Pote J 09/05/2011, 8:20 AM

## 2011-09-06 MED ORDER — BISACODYL 10 MG RE SUPP
10.0000 mg | Freq: Once | RECTAL | Status: AC
  Administered 2011-09-06: 10 mg via RECTAL
  Filled 2011-09-06: qty 1

## 2011-09-06 MED ORDER — HYDROCORTISONE 2.5 % RE CREA
TOPICAL_CREAM | Freq: Two times a day (BID) | RECTAL | Status: DC | PRN
  Administered 2011-09-06: 11:00:00 via RECTAL
  Filled 2011-09-06: qty 28.35

## 2011-09-06 MED ORDER — FLUCONAZOLE 100 MG PO TABS
100.0000 mg | ORAL_TABLET | Freq: Once | ORAL | Status: AC
  Administered 2011-09-06: 100 mg via ORAL
  Filled 2011-09-06: qty 1

## 2011-09-06 NOTE — Progress Notes (Signed)
Physical Therapy Note  Patient Details  Name: [name] MRN: [id number] Date of Birth: [birth year] Today's Date: 09/06/2011  4098-1191 (40 minutes) individual Pain: no complaint of pain Precautions: back Focus of treatment: Gait training/endurance; bilateral LE strengthening Treatment : Gait - 120 feet X 2 RW SBA to distant SBA; Nustep Level 4 X 5 minutes/ Level 5 for 5 minutes  for bilateral LE strengthening ;alternate stepping up/down 6 inch step with minimal UE support X 15  and without UE support for quad strengthening/balance; gait without AD 160 feet close SBA with no loss of balance;  Stepping over obstacle X 15 without UE support close SBA   Toniette Devera,JIM 09/06/2011, 9:51 AM

## 2011-09-06 NOTE — Progress Notes (Signed)
Patient ID: [name], female   DOB: 08-08-1970, 41 y.o.   MRN: [id number] Subjective/Complaints: Slept fairly well. Emptied bladder once. Having flatus but no bm still . Dysesthesias better in legs. Objective: Vital Signs: Blood pressure 121/80, pulse 90, temperature 98 F (36.7 C), temperature source Oral, resp. rate 18, height 5\' 4"  (1.626 m), weight 107.956 kg (238 lb), last menstrual period 02/27/2011, SpO2 97.00%. No results found.  Basename 09/04/11 0630  WBC 6.4  HGB 9.9*  HCT 30.2*  PLT 239    Basename 09/04/11 0630  NA 139  K 3.6  CL 102  CO2 27  GLUCOSE 100*  BUN 9  CREATININE 0.74  CALCIUM 8.3*   CBG (last 3)  No results found for this basename: GLUCAP:3 in the last 72 hours  Wt Readings from Last 3 Encounters:  09/03/11 107.956 kg (238 lb)  08/28/11 107.8 kg (237 lb 10.5 oz)  08/28/11 107.8 kg (237 lb 10.5 oz)    Physical Exam:  General appearance: alert, cooperative and no distress Head: Normocephalic, without obvious abnormality, atraumatic Eyes: conjunctivae/corneas clear. PERRL, EOM's intact. Fundi benign. Ears: normal TM's and external ear canals both ears Nose: Nares normal. Septum midline. Mucosa normal. No drainage or sinus tenderness. Throat: lips, mucosa, and tongue normal; teeth and gums normal Neck: no adenopathy, no carotid bruit, no JVD, supple, symmetrical, trachea midline and thyroid not enlarged, symmetric, no tenderness/mass/nodules Back: symmetric, no curvature. ROM normal. No CVA tenderness. Resp: clear to auscultation bilaterally Cardio: regular rate and rhythm, S1, S2 normal, no murmur, click, rub or gallop GI: soft, non-tender; bowel sounds normal; no masses,  no organomegaly Extremities: extremities normal, atraumatic, no cyanosis or edema Pulses: 2+ and symmetric Skin: Skin color, texture, turgor normal. No rashes or lesions Neurologic: L4, L5, S1, S2 weakness right greater than left.  (1-2/5 vs 2-3/5)_Sensation 1/2 right  more affected than left also. dtr's decreased.  Incision/Wound: clean and intact   Assessment/Plan: 1. Functional deficits secondary to Cauda Equina Syndrome secondary to L5-S1 disk which require 3+ hours per day of interdisciplinary therapy in a comprehensive inpatient rehab setting. Physiatrist is providing close team supervision and 24 hour management of active medical problems listed below. Physiatrist and rehab team continue to assess barriers to discharge/monitor patient progress toward functional and medical goals.  She's moving quite well. i suspect she should be able to go home early this week. FIM: FIM - Bathing Bathing Steps Patient Completed: Chest;Right Arm;Left Arm;Abdomen;Front perineal area;Buttocks;Right upper leg;Left upper leg;Right lower leg (including foot);Left lower leg (including foot) (stood and sat at sink) Bathing: 5: Supervision: Safety issues/verbal cues  FIM - Upper Body Dressing/Undressing Upper body dressing/undressing steps patient completed: Thread/unthread right bra strap;Thread/unthread left bra strap;Hook/unhook bra;Thread/unthread right sleeve of pullover shirt/dresss;Thread/unthread left sleeve of pullover shirt/dress;Put head through opening of pull over shirt/dress;Pull shirt over trunk Upper body dressing/undressing: 5: Supervision: Safety issues/verbal cues FIM - Lower Body Dressing/Undressing Lower body dressing/undressing steps patient completed: Thread/unthread right underwear leg;Thread/unthread left underwear leg;Pull underwear up/down;Thread/unthread right pants leg;Thread/unthread left pants leg;Pull pants up/down;Fasten/unfasten pants;Don/Doff right sock;Don/Doff left sock Lower body dressing/undressing: 5: Set-up assist to: Obtain clothing  FIM - Toileting Toileting steps completed by patient: Adjust clothing prior to toileting;Performs perineal hygiene;Adjust clothing after toileting Toileting Assistive Devices: Grab bar or rail for  support Toileting: 5: Supervision: Safety issues/verbal cues  FIM - Diplomatic Services operational officer Devices: Art gallery manager Transfers: 5-To toilet/BSC: Supervision (verbal cues/safety issues);5-From toilet/BSC: Supervision (verbal cues/safety issues)  FIM - Bed/Chair  Transfer Bed/Chair Transfer: 4: Supine > Sit: Min A (steadying Pt. > 75%/lift 1 leg);4: Sit > Supine: Min A (steadying pt. > 75%/lift 1 leg);4: Bed > Chair or W/C: Min A (steadying Pt. > 75%);4: Chair or W/C > Bed: Min A (steadying Pt. > 75%) (req cues to following back precautions with siit<>supine)  FIM - Locomotion: Wheelchair Locomotion: Wheelchair: 1: Total Assistance/staff pushes wheelchair (Pt<25%) FIM - Locomotion: Ambulation Locomotion: Ambulation Assistive Devices: Designer, industrial/product Ambulation/Gait Assistance: 4: Min assist Locomotion: Ambulation: 2: Travels 50 - 149 ft with minimal assistance (Pt.>75%)  Comprehension Comprehension Mode: Auditory Comprehension: 7-Follows complex conversation/direction: With no assist  Expression Expression Mode: Verbal Expression: 7-Expresses complex ideas: With no assist  Social Interaction Social Interaction: 7-Interacts appropriately with others - No medications needed.  Problem Solving Problem Solving: 7-Solves complex problems: Recognizes & self-corrects  Memory Memory: 7-Complete Independence: No helper  1. 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 Status post decompressive laminectomy  2. DVT Prophylaxis/Anticoagulation: SCDs. Venous Doppler study 08/30/2011 negative  3. Pain Management: Robaxin, Percocet. May need scheduled  -added neurontin for neuropathic pain which has helped quite a bit. 4. Enterococcus UTI. Amoxicillin x7 days  5. Hypokalemia. Followup labs 6. ABLA- multivitamin 7. Neurogenic bowel and bladder- continue training. Stool softener/laxative  -will give dulcolax supp NOW   LOS (Days) 3 A FACE TO FACE EVALUATION WAS  PERFORMED  Kindell Strada T 09/06/2011, 6:58 AM

## 2011-09-07 NOTE — Discharge Summary (Signed)
[name], [name]               ACCOUNT NO.:  [id number]  MEDICAL RECORD NO.:  [id number]  LOCATION:  4004                         FACILITY:  [facility]  PHYSICIAN:  Ranelle Oyster, M.D.DATE OF BIRTH:  March 06, 1971  DATE OF ADMISSION:  09/03/2011 DATE OF DISCHARGE:  09/08/2011                              DISCHARGE SUMMARY   DISCHARGE DIAGNOSES: 1. Cauda equina syndrome due to extruded lumbar L5-S1 disk. 2. Sequential compression devices for deep vein thrombosis     prophylaxis, pain management, Enterococcus urinary tract infection,     hypokalemia.  A 41 year old female with history of scoliosis, abdominal pain with hysterectomy April 2013, admitted June 7, routine with abdominal pain, nausea, vomiting, treated with intravenous fluids.  The patient with noted urinary incontinence, bilateral lower extremity numbness and tingling with weakness.  MRI of lumbar spine revealed right adnexal lesion, likely hematoma, central disk extrusion lumbar L5-S1 could encroach on S1 nerve root.  Pelvic transvaginal ultrasound without evidence of ovarian cyst.  Developed chest pain with dyspnea, questionable anxiety attack versus aspiration pneumonia, placed on broad- spectrum antibiotics.  Evaluated by Dr. Worthy Rancher, Orthopedic Services.  Underwent microdiskectomy lumbar L5-S1 for decompressive laminectomy.  Postoperative pain management.  Routine back precautions. Venous Doppler studies lower extremity August 30, 2011, negative.  Placed on amoxicillin for Enterococcus urinary tract infection.  The patient was admitted for comprehensive rehab program.  PAST MEDICAL HISTORY:  See discharge diagnoses.  The patient lives with family assistance as needed.  Functional history upon admission to rehab services required min to moderate assist for overall mobility.  PHYSICAL EXAMINATION:  VITAL SIGNS:  Blood pressure 108/72, pulse 93, temperature 98, respirations 18. GENERAL:  This is an alert female, in  no acute distress, oriented x3. LUNGS:  Clear to auscultation. CARDIAC:  Regular rate and rhythm.  ABDOMEN:  Soft, nontender.  Good bowel sounds. SKIN:  Warm and dry.  Staples intact.  No erythema.  REHABILITATION HOSPITAL COURSE:  The patient was admitted to inpatient rehab services with therapies initiated on a 3-hour daily basis consisting of physical therapy, occupational therapy, and rehabilitation nursing.  The following issues were addressed during the patient's rehabilitation stay.  Pertaining to Ms. Dillinger's cauda equina syndrome lumbar L5-S1, decompressive laminectomy, surgical site showing no signs of infection, she would follow up Dr. Worthy Rancher.  Routine back precautions is advised.  She had been placed on Neurontin 100 mg 3 times daily as well as hydrocodone for pain control with Robaxin as needed. She remained on sequential compression devices for DVT prophylaxis. Venous Doppler study August 30, 2011, were negative.  She was completing a course of amoxicillin for urinary tract infection.  She remained afebrile with no complaints of dysuria or hematuria.  The patient received weekly collaborative interdisciplinary team conferences to discuss estimated length of stay, family teaching, and any barriers to her discharge.  She was ambulating extended household distances, 120 feet x 2 with a rolling walker, alternating steps up and down 6 inch stairs with minimal upper extremity support.  No loss of balance noted. She did need some minimal assist for lower body activities of daily living.  The patient with excellent progress throughout her rehab  stay and plans to be discharged to home on September 08, 2011, with ongoing therapies dictated as per rehab services.  DISCHARGE MEDICATIONS:  Amoxicillin 250 mg every 8 hours until September 10, 2011, and stop, Neurontin 100 mg p.o. t.i.d., Norco 1 or 2 tablets every 4 hours as needed pain.  Robaxin 500 mg p.o. every 6 hours as needed  for spasms, multivitamin daily.  DIET:  Regular.  SPECIAL INSTRUCTIONS:  Routine back precautions.  The patient should follow up Dr. Worthy Rancher, Orthopedic Services call for appointment. Follow up with Mikeal Hawthorne, medical management.  Dr. Daleen Bo, the outpatient rehab service office as needed.     Mariam Dollar, P.A.   ______________________________ Ranelle Oyster, M.D.    DA/MEDQ  D:  09/07/2011  T:  09/07/2011  Job:  [id number]  cc:   Merlene Laughter. Renae Gloss, M.D. Ronald A. Darrelyn Hillock, M.D.

## 2011-09-07 NOTE — Progress Notes (Signed)
Occupational Therapy Session Notes  Patient Details  Name: [name] MRN: [id number] Date of Birth: 07-14-1970  Today's Date: 09/07/2011  Short Term Goals: Week 1:  OT Short Term Goal 1 (Week 1): Short Term Goals = Long Term Goals  Skilled Therapeutic Interventions/Progress Updates:   Session #1 0930-1030 - 60 Minutes Individual Therapy No complaints of pain Patient found seated in recliner. Engaged in ADL retraining at shower level and performed bathing & dressing at a modified independent level. Patient able to perform functional mobility throughout room with and without assistive device safely. Patient then engaged in light housekeeping of making bed, simulated laundry task, and watering plants.   Session #2 1400-1430 - 30 Minutes Individual Therapy No complaints of pain Focused skilled intervention on IADL kitchen task working on simple meal prep; patient made cookies. Patient overall independent for kitchen tasks, able to perform without using assistive device.  Precautions:  Precautions Precautions: Back;Fall Precaution Comments: during OT eval patient able to recall 3/3 back precautions Restrictions Weight Bearing Restrictions: No Other Position/Activity Restrictions: weakness/sensory changes RLE  See FIM for current functional status  [name] 09/07/2011, 9:45 AM

## 2011-09-07 NOTE — Progress Notes (Signed)
Reviewed and in agreement with treatment provided.  

## 2011-09-07 NOTE — Discharge Instructions (Signed)
Inpatient Rehab Discharge Instructions  [place] [facility] [place] Discharge date and time: No discharge date for patient encounter.   Activities/Precautions/ Functional Status: Activity: activity as tolerated Diet: regular diet Wound Care: keep wound clean and dry Functional status:  ___ No restrictions     ___ Walk up steps independently _x__ 24/7 supervision/assistance   ___ Walk up steps with assistance ___ Intermittent supervision/assistance  ___ Bathe/dress independently ___ Walk with walker     ___ Bathe/dress with assistance ___ Walk Independently    ___ Shower independently __x_ Walk with assistance    __x_ Shower with assistance ___ No alcohol     ___ Return to work/school ________  Special Instructions:    COMMUNITY REFERRALS UPON DISCHARGE:    Outpatient: PT  Agency:[facility] [phone] Date of Last Service:09/08/2011  Appointment Date/Time:JUNE 19 7:45-9:00 AM  Medical Equipment/Items Ordered:BSC, CANE  Agency/Supplier:[facility]  [phone]       My questions have been answered and I understand these instructions. I will adhere to these goals and the provided educational materials after my discharge from the hospital.  Patient/Caregiver Signature _______________________________ Date __________  Clinician Signature _______________________________________ Date __________  Please bring this form and your medication list with you to all your follow-up doctor's appointments.

## 2011-09-07 NOTE — Progress Notes (Signed)
Patient ID: [name], female   DOB: 05-06-1970, 41 y.o.   MRN: [id number] Patient ID: [name], female   DOB: 1970-08-25, 41 y.o.   MRN: [id number] Subjective/Complaints: Slept fairly well. Emptied bowels. No complaints. Moving well with therapies. Yeast-like vaginal dc noted Objective: Vital Signs: Blood pressure 116/77, pulse 86, temperature 97.7 F (36.5 C), temperature source Oral, resp. rate 18, height 5\' 4"  (1.626 m), weight 107.956 kg (238 lb), last menstrual period 02/27/2011, SpO2 97.00%. No results found. No results found for this basename: WBC:2,HGB:2,HCT:2,PLT:2 in the last 72 hours No results found for this basename: NA:2,K:2,CL:2,CO2:2,GLUCOSE:2,BUN:2,CREATININE:2,CALCIUM:2 in the last 72 hours CBG (last 3)  No results found for this basename: GLUCAP:3 in the last 72 hours  Wt Readings from Last 3 Encounters:  09/03/11 107.956 kg (238 lb)  08/28/11 107.8 kg (237 lb 10.5 oz)  08/28/11 107.8 kg (237 lb 10.5 oz)    Physical Exam:  General appearance: alert, cooperative and no distress Head: Normocephalic, without obvious abnormality, atraumatic Eyes: conjunctivae/corneas clear. PERRL, EOM's intact. Fundi benign. Ears: normal TM's and external ear canals both ears Nose: Nares normal. Septum midline. Mucosa normal. No drainage or sinus tenderness. Throat: lips, mucosa, and tongue normal; teeth and gums normal Neck: no adenopathy, no carotid bruit, no JVD, supple, symmetrical, trachea midline and thyroid not enlarged, symmetric, no tenderness/mass/nodules Back: symmetric, no curvature. ROM normal. No CVA tenderness. Resp: clear to auscultation bilaterally Cardio: regular rate and rhythm, S1, S2 normal, no murmur, click, rub or gallop GI: soft, non-tender; bowel sounds normal; no masses,  no organomegaly Extremities: extremities normal, atraumatic, no cyanosis or edema Pulses: 2+ and symmetric Skin: Skin color, texture, turgor normal. No rashes or  lesions Neurologic: L4, L5, S1, S2 weakness right greater than left.  (2-3/5 vs 3-4/5)_Sensation 1/2 right more affected than left also. dtr's decreased.  Incision/Wound: clean and intact   Assessment/Plan: 1. Functional deficits secondary to Cauda Equina Syndrome secondary to L5-S1 disk which require 3+ hours per day of interdisciplinary therapy in a comprehensive inpatient rehab setting. Physiatrist is providing close team supervision and 24 hour management of active medical problems listed below. Physiatrist and rehab team continue to assess barriers to discharge/monitor patient progress toward functional and medical goals.  She's moving quite well. i suspect she should be able to go home over next 24 hours. Discuss with team today. FIM: FIM - Bathing Bathing Steps Patient Completed: Chest;Right Arm;Left Arm;Abdomen;Front perineal area;Buttocks;Right upper leg;Left upper leg;Right lower leg (including foot);Left lower leg (including foot) Bathing: 6: More than reasonable amount of time  FIM - Upper Body Dressing/Undressing Upper body dressing/undressing steps patient completed: Thread/unthread right bra strap;Thread/unthread left bra strap;Hook/unhook bra;Thread/unthread right sleeve of pullover shirt/dresss;Thread/unthread left sleeve of pullover shirt/dress;Put head through opening of pull over shirt/dress;Pull shirt over trunk Upper body dressing/undressing: 6: More than reasonable amount of time FIM - Lower Body Dressing/Undressing Lower body dressing/undressing steps patient completed: Thread/unthread right underwear leg;Thread/unthread left underwear leg;Pull underwear up/down;Thread/unthread right pants leg;Thread/unthread left pants leg;Pull pants up/down;Don/Doff right sock;Don/Doff left sock Lower body dressing/undressing: 6: More than reasonable amount of time  FIM - Toileting Toileting steps completed by patient: Adjust clothing prior to toileting;Performs perineal  hygiene;Adjust clothing after toileting Toileting Assistive Devices: Grab bar or rail for support Toileting: 6: More than reasonable amount of time  FIM - Diplomatic Services operational officer Devices: Art gallery manager Transfers: 5-To toilet/BSC: Supervision (verbal cues/safety issues);5-From toilet/BSC: Supervision (verbal cues/safety issues)  FIM - Bed/Chair Transfer Bed/Chair Transfer: 4:  Bed > Chair or W/C: Min A (steadying Pt. > 75%);4: Supine > Sit: Min A (steadying Pt. > 75%/lift 1 leg)  FIM - Locomotion: Wheelchair Locomotion: Wheelchair: 1: Total Assistance/staff pushes wheelchair (Pt<25%) FIM - Locomotion: Ambulation Locomotion: Ambulation Assistive Devices: Designer, industrial/product Ambulation/Gait Assistance: 4: Min assist Locomotion: Ambulation: 2: Travels 50 - 149 ft with minimal assistance (Pt.>75%)  Comprehension Comprehension Mode: Auditory Comprehension: 7-Follows complex conversation/direction: With no assist  Expression Expression Mode: Verbal Expression: 7-Expresses complex ideas: With no assist  Social Interaction Social Interaction: 7-Interacts appropriately with others - No medications needed.  Problem Solving Problem Solving: 7-Solves complex problems: Recognizes & self-corrects  Memory Memory: 7-Complete Independence: No helper  1. 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 Status post decompressive laminectomy  2. DVT Prophylaxis/Anticoagulation: SCDs. Venous Doppler study 08/30/2011 negative  3. Pain Management: Robaxin, Percocet. May need scheduled  -added neurontin for neuropathic pain which has helped quite a bit. 4. Enterococcus UTI. Amoxicillin x7 days   -diflucan x 1 dose for vaginal yeast 5. Hypokalemia. Followup labs 6. ABLA- multivitamin 7. Neurogenic bowel and bladder- continue training. Stool softener/laxative  -will give dulcolax supp NOW   LOS (Days) 4 A FACE TO FACE EVALUATION WAS PERFORMED  Jatavious Peppard T 09/07/2011, 8:17  AM

## 2011-09-07 NOTE — Progress Notes (Signed)
Occupational Therapy Discharge Summary  Patient Details  Name: [name] MRN: [id number] Date of Birth: [birth year]  Today's Date: 09/07/2011  Patient has met 11 of 11 long term goals due to improved activity tolerance, improved balance, postural control, ability to compensate for deficits, improved awareness and improved coordination.  Patient to discharge at overall modified independent -> independent level.    Reasons goals not met: n/a all goals met at this time.  Recommendation: No additional occupational therapy recommended at this time.  Equipment: BSC, patient reports she has a built in shower seat for her shower.  Reasons for discharge: treatment goals met and discharge from hospital  Patient/family agrees with progress made and goals achieved: Yes  Precautions/Restrictions  Precautions Precautions: Back;Fall Precaution Comments: patient able to verbalize and adhere to 3/3 back precautions independently Restrictions Weight Bearing Restrictions: No  ADL - See FIM  Vision/Perception  Vision - History Baseline Vision: No visual deficits Patient Visual Report: No change from baseline Vision - Assessment Eye Alignment: Within Functional Limits Perception Perception: Within Functional Limits Praxis Praxis: Intact   Cognition Overall Cognitive Status: Appears within functional limits for tasks assessed Arousal/Alertness: Awake/alert Orientation Level: Oriented X4 Memory: Appears intact Awareness: Appears intact Problem Solving: Appears intact Safety/Judgment: Appears intact  Sensation Sensation Additional Comments: UEs appear intact Coordination Gross Motor Movements are Fluid and Coordinated: Yes Fine Motor Movements are Fluid and Coordinated: Yes  Motor - See Discharge Navigator   Mobility  - See Discharge Navigator   Trunk/Postural Assessment  - See Discharge Navigator   Balance - See Discharge Navigator   Extremity/Trunk Assessment RUE  Assessment RUE Assessment: Within Functional Limits LUE Assessment LUE Assessment: Within Functional Limits  See FIM for current functional status  Dore Oquin 09/07/2011, 3:37 PM

## 2011-09-07 NOTE — Progress Notes (Signed)
Social Work Patient ID: [name], female   DOB: Mar 12, 1971, 41 y.o.   MRN: [id number] Team feels pt has met her goals and is ready for discharge tomorrow.  Recommend OPPT and bsc and cane. Pt is agreeable to these items and has no pref.  Dan-PA aware and MD approved discharge tomorrow.

## 2011-09-07 NOTE — Progress Notes (Signed)
Physical Therapy Discharge Summary  Patient Details  Name: [name] MRN: [id number] Date of Birth: 12-02-1970  Today's Date: 09/07/2011  Patient has met 8 of 8 long term goals due to improved activity tolerance, improved balance, improved postural control, increased strength, decreased pain, ability to compensate for deficits and improved awareness.  Patient to discharge at an ambulatory level Modified Independent.   Though patient is Mod I, patient's family is able to provide any assistance if needed.  Recommendation:  Patient will benefit from ongoing skilled PT services in outpatient setting to continue to advance safe functional mobility, address ongoing impairments in activity tolerance, endurance, strengthening, dynamic gait, and minimize fall risk.  Equipment: standard cane for community ambulation  Reasons for discharge: treatment goals met and discharge from hospital  Patient/family agrees with progress made and goals achieved: Yes  PT Discharge Precautions/Restrictions Precautions Precautions: Back;Fall Precaution Comments: patient able to verbalize and adhere to 3/3 back precautions independently Restrictions Weight Bearing Restrictions: No Other Position/Activity Restrictions: weakness/sensory changes RLE Vision/Perception  Perception Perception: Within Functional Limits Praxis Praxis: Intact  Cognition Overall Cognitive Status: Appears within functional limits for tasks assessed Arousal/Alertness: Awake/alert Orientation Level: Oriented X4 Memory: Appears intact Awareness: Appears intact Problem Solving: Appears intact Safety/Judgment: Appears intact Sensation Sensation Light Touch: Impaired by gross assessment (decreased sensation in RLE L4, L5 S1; otherwise equal/intact) Proprioception: Appears Intact Additional Comments: LT RLE sensation improved since eval but remains diminished Coordination Gross Motor Movements are Fluid and Coordinated:  Yes Fine Motor Movements are Fluid and Coordinated: Yes Motor  Motor Motor: Within Functional Limits  Locomotion  Ambulation Ambulation/Gait Assistance: 6: Modified independent (Device/Increase time) Gait Gait: Yes Gait Pattern: Within Functional Limits Gait Pattern: Step-through pattern;Decreased step length - right;Decreased step length - left;Decreased stride length;Decreased hip/knee flexion - right;Decreased hip/knee flexion - left;Decreased trunk rotation Gait velocity: decreased and cautious Stairs / Additional Locomotion Stairs: Yes Stair Management Technique: One rail Right Number of Stairs: 12  Height of Stairs: 6   Trunk/Postural Assessment  Cervical Assessment Cervical Assessment: Within Functional Limits Thoracic Assessment Thoracic Assessment: Within Functional Limits Lumbar Assessment Lumbar Assessment: Exceptions to [facility] (back precautions) Postural Control Postural Control: Within Functional Limits  Balance Balance Balance Assessed: Yes Standardized Balance Assessment Standardized Balance Assessment: Dynamic Gait Index Dynamic Gait Index Level Surface: Normal Change in Gait Speed: Normal Gait with Horizontal Head Turns: Normal Gait with Vertical Head Turns: Normal Gait and Pivot Turn: Normal Step Over Obstacle: Mild Impairment Step Around Obstacles: Normal Steps: Mild Impairment Total Score: 22  Dynamic Standing Balance Dynamic Standing - Level of Assistance: 6: Modified independent (Device/Increase time) Extremity Assessment  RUE Assessment RUE Assessment: Within Functional Limits LUE Assessment LUE Assessment: Within Functional Limits RLE Assessment RLE Assessment: Exceptions to [facility] (decreased sensation L4, L5, S1) RLE Strength RLE Overall Strength Comments: 4/5 overall LLE Assessment LLE Assessment: Within Functional Limits LLE Strength LLE Overall Strength Comments: 4/5 overall  See FIM for current functional status  Skilled therapy  session #1: Time: 1100-1200 Time Calculation (min): 60 min  Individual therapy session.  No c/o pain. Pt reports increased sensation along bottom of foot, though it still does not feel "normal".  Tx focused on high-level dynamic gait include side-stepping, diagonal stepping (forwards and backwards) backwards walking, and forwards walking with dual task training.  Pt able to multi-task with high level amb while catching/throwing ball and verbally performing cognitive tasks.  No LOB.  Pt ascended/descending 12 steps with step-to gait using 1 railing.  Pt  completed all high level tasks and stairs without AD.  Educated pt with cane and practiced amb in controlled and home environment >150'.  Pt educated on energy conservation and using cane to improve balance and safety in community environment as needed.  Pt able to perform 2-point gait while holding cane in LUE.    Skilled therapy session #2: Time: 8413-2440 Time Calculation (min): 45 min  Individual therapy session.  No c/o pain.  Pt exercised on Nustep x for improved cardiorespiratory fitness, strengthening, and activity tolerance.  Tx focused on dynamic gait activities including quick changes in directions, changing pace, stepping over obstacles, stepping around obstacles, and gait with vertical and lateral head turns during DGI testing.  Pt scored 22/24 on DGI and educated on the significance of this value.  Pt demonstrates safety awareness and caution when appropriate with gait.  Pt performed 12 steps with reciprocal gait this pm.  Pt performed [place] exercises, requiring tactile and verbal cueing for proper squat.  Also educated pt with sit-->stands without UE use.  Pt able to perform safely.  Provided pt with HEP handouts.  Educated pt on energy conservation in the community, energy conservation at work, and home modifications.  Pt reports no further questions or issues.  Deirdre Pippins 09/07/2011, 4:24 PM

## 2011-09-07 NOTE — Discharge Summary (Signed)
  Discharge summary job [id number]

## 2011-09-08 MED ORDER — TAB-A-VITE/IRON PO TABS: 1.0000 | ORAL_TABLET | Freq: Every day | ORAL | Status: DC

## 2011-09-08 MED ORDER — HYDROCODONE-ACETAMINOPHEN 5-325 MG PO TABS: 1.0000 | ORAL_TABLET | ORAL | Status: AC | PRN

## 2011-09-08 MED ORDER — GABAPENTIN 100 MG PO CAPS: 100.0000 mg | ORAL_CAPSULE | Freq: Three times a day (TID) | ORAL | Status: DC

## 2011-09-08 MED ORDER — AMOXICILLIN 250 MG PO CAPS: 250.0000 mg | ORAL_CAPSULE | Freq: Three times a day (TID) | ORAL | Status: AC

## 2011-09-08 MED ORDER — METHOCARBAMOL 500 MG PO TABS: 500.0000 mg | ORAL_TABLET | Freq: Four times a day (QID) | ORAL | Status: AC | PRN

## 2011-09-08 NOTE — Progress Notes (Signed)
Subjective: Doing very well. Wound looks fine and her sensation is remarkably improved in her lowers. She is no longer incontinent and is now ambulating with a cane.    Objective: Vital signs in last 24 hours: Temp:  [97.5 F (36.4 C)-98 F (36.7 C)] 98 F (36.7 C) (06/18 0520) Pulse Rate:  [77-105] 77  (06/18 0520) Resp:  [19-20] 19  (06/18 0520) BP: (107-108)/(62-75) 108/62 mmHg (06/18 0520) SpO2:  [100 %] 100 % (06/18 0520)  Intake/Output from previous day: 06/17 0701 - 06/18 0700 In: 240 [P.O.:240] Out: 2 [Urine:2] Intake/Output this shift:    No results found for this basename: HGB:5 in the last 72 hours No results found for this basename: WBC:2,RBC:2,HCT:2,PLT:2 in the last 72 hours No results found for this basename: NA:2,K:2,CL:2,CO2:2,BUN:2,CREATININE:2,GLUCOSE:2,CALCIUM:2 in the last 72 hours No results found for this basename: LABPT:2,INR:2 in the last 72 hours  Neurovascular intact  Assessment/Plan: DC Today. Will see in office in 1-week.   Rupinder Livingston A 09/08/2011, 7:20 AM

## 2011-09-08 NOTE — Progress Notes (Signed)
Subjective/Complaints: Slept fairly well. Emptied bowels. No complaints. Moving well with therapies. Excited to go home Objective: Vital Signs: Blood pressure 108/62, pulse 77, temperature 98 F (36.7 C), temperature source Oral, resp. rate 19, height 5\' 4"  (1.626 m), weight 107.956 kg (238 lb), last menstrual period 02/27/2011, SpO2 100.00%. No results found. No results found for this basename: WBC:2,HGB:2,HCT:2,PLT:2 in the last 72 hours No results found for this basename: NA:2,K:2,CL:2,CO2:2,GLUCOSE:2,BUN:2,CREATININE:2,CALCIUM:2 in the last 72 hours CBG (last 3)  No results found for this basename: GLUCAP:3 in the last 72 hours  Wt Readings from Last 3 Encounters:  09/03/11 107.956 kg (238 lb)  08/28/11 107.8 kg (237 lb 10.5 oz)  08/28/11 107.8 kg (237 lb 10.5 oz)    Physical Exam:  General appearance: alert, cooperative and no distress Head: Normocephalic, without obvious abnormality, atraumatic Eyes: conjunctivae/corneas clear. PERRL, EOM's intact. Fundi benign. Ears: normal TM's and external ear canals both ears Nose: Nares normal. Septum midline. Mucosa normal. No drainage or sinus tenderness. Throat: lips, mucosa, and tongue normal; teeth and gums normal Neck: no adenopathy, no carotid bruit, no JVD, supple, symmetrical, trachea midline and thyroid not enlarged, symmetric, no tenderness/mass/nodules Back: symmetric, no curvature. ROM normal. No CVA tenderness. Resp: clear to auscultation bilaterally Cardio: regular rate and rhythm, S1, S2 normal, no murmur, click, rub or gallop GI: soft, non-tender; bowel sounds normal; no masses,  no organomegaly Extremities: extremities normal, atraumatic, no cyanosis or edema Pulses: 2+ and symmetric Skin: Skin color, texture, turgor normal. No rashes or lesions Neurologic: L4, L5, S1, S2 weakness right greater than left.  (4/5)_Sensation 1/2 right more affected than left also. dtr's decreased.  Incision/Wound: clean and  intact   Assessment/Plan: 1. Functional deficits secondary to Cauda Equina Syndrome secondary to L5-S1 disk which require 3+ hours per day of interdisciplinary therapy in a comprehensive inpatient rehab setting. Physiatrist is providing close team supervision and 24 hour management of active medical problems listed below. Physiatrist and rehab team continue to assess barriers to discharge/monitor patient progress toward functional and medical goals.  Home today FIM: FIM - Bathing Bathing Steps Patient Completed: Chest;Right Arm;Left Arm;Abdomen;Front perineal area;Buttocks;Right upper leg;Left upper leg;Right lower leg (including foot);Left lower leg (including foot) Bathing: 6: Assistive device (Comment) (seated)  FIM - Upper Body Dressing/Undressing Upper body dressing/undressing steps patient completed: Thread/unthread right bra strap;Thread/unthread left bra strap;Hook/unhook bra;Thread/unthread right sleeve of pullover shirt/dresss;Thread/unthread left sleeve of pullover shirt/dress;Put head through opening of pull over shirt/dress;Pull shirt over trunk Upper body dressing/undressing: 7: Complete Independence: No helper FIM - Lower Body Dressing/Undressing Lower body dressing/undressing steps patient completed: Thread/unthread right underwear leg;Thread/unthread left underwear leg;Pull underwear up/down;Thread/unthread right pants leg;Thread/unthread left pants leg;Pull pants up/down;Don/Doff right sock;Don/Doff left sock;Don/Doff right shoe;Don/Doff left shoe;Fasten/unfasten right shoe;Fasten/unfasten left shoe Lower body dressing/undressing: 6: Assistive device (Comment) (seated)  FIM - Toileting Toileting steps completed by patient: Adjust clothing prior to toileting;Performs perineal hygiene;Adjust clothing after toileting Toileting Assistive Devices: Grab bar or rail for support Toileting: 6: Assistive device: No helper  FIM - Diplomatic Services operational officer Devices:  Psychiatrist Transfers: 6-Assistive device: No helper  FIM - Games developer Transfer: 6: Supine > Sit: No assist;6: Sit > Supine: No assist;6: Bed > Chair or W/C: No assist;6: Chair or W/C > Bed: No assist  FIM - Locomotion: Wheelchair Locomotion: Wheelchair: 0: Activity did not occur FIM - Locomotion: Ambulation Locomotion: Ambulation Assistive Devices: Cane - Straight (pt amb without AD but practice using cane for comm amb) Ambulation/Gait  Assistance: 6: Modified independent (Device/Increase time) Locomotion: Ambulation: 6: Travels 150 ft or more independently/takes more than reasonable amount of time  Comprehension Comprehension Mode: Auditory Comprehension: 7-Follows complex conversation/direction: With no assist  Expression Expression Mode: Verbal Expression: 7-Expresses complex ideas: With no assist  Social Interaction Social Interaction: 7-Interacts appropriately with others - No medications needed.  Problem Solving Problem Solving: 7-Solves complex problems: Recognizes & self-corrects  Memory Memory: 7-Complete Independence: No helper  1. 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 Status post decompressive laminectomy  2. DVT Prophylaxis/Anticoagulation: SCDs. Venous Doppler study 08/30/2011 negative  3. Pain Management: Robaxin, Percocet. May need scheduled  -added neurontin for neuropathic pain which has helped quite a bit.  -discussed the fact with her that it may be months before she sees imoprovement in sensation 4. Enterococcus UTI. Amoxicillin x7 days   -diflucan x 1 dose for vaginal yeast- better 5. Hypokalemia. Followup labs 6. ABLA- multivitamin 7. Neurogenic bowel and bladder- continue training. Stool softener/laxative  -home regimen   LOS (Days) 5 A FACE TO FACE EVALUATION WAS PERFORMED  [name] T 09/08/2011, 7:15 AM

## 2011-09-08 NOTE — Progress Notes (Signed)
Pt discharged at 1200 with all belongings with family. Education complete and no questions at this time.

## 2011-09-08 NOTE — Progress Notes (Signed)
Social Work Discharge Note Discharge Note  The overall goal for the admission was met for:   Discharge location: Yes-HOME WITH 41 YO SON  Length of Stay: Yes-5 DAYS  Discharge activity level: Yes-MOD/I LEVEL  Home/community participation: Yes  Services provided included: MD, RD, PT, OT, RN, CM, TR, Pharmacy and SW  Financial Services: Medicaid  Follow-up services arranged: Outpatient: [facility]PT 6/19 7;45-9;00 and DME: [facility]  Comments (or additional information):  Patient/Family verbalized understanding of follow-up arrangements: Yes  Individual responsible for coordination of the follow-up plan: SELF  Confirmed correct DME delivered: Lucy Chris 09/08/2011    Seanna Sisler, Lemar Livings

## 2011-09-09 ENCOUNTER — Ambulatory Visit: Payer: Medicaid Other | Attending: Physical Medicine & Rehabilitation | Admitting: Physical Therapy

## 2011-09-09 DIAGNOSIS — IMO0001 Reserved for inherently not codable concepts without codable children: Secondary | ICD-10-CM | POA: Insufficient documentation

## 2011-09-09 DIAGNOSIS — R5381 Other malaise: Secondary | ICD-10-CM

## 2011-09-09 DIAGNOSIS — Z5189 Encounter for other specified aftercare: Secondary | ICD-10-CM

## 2011-09-09 DIAGNOSIS — G834 Cauda equina syndrome: Secondary | ICD-10-CM

## 2011-09-09 DIAGNOSIS — R269 Unspecified abnormalities of gait and mobility: Secondary | ICD-10-CM | POA: Insufficient documentation

## 2011-09-16 ENCOUNTER — Ambulatory Visit: Payer: Medicaid Other | Admitting: Physical Therapy

## 2011-09-18 ENCOUNTER — Ambulatory Visit: Payer: Medicaid Other | Admitting: Physical Therapy

## 2011-09-21 ENCOUNTER — Ambulatory Visit: Payer: Medicaid Other | Attending: Physical Medicine & Rehabilitation | Admitting: Physical Therapy

## 2011-09-21 DIAGNOSIS — IMO0001 Reserved for inherently not codable concepts without codable children: Secondary | ICD-10-CM | POA: Insufficient documentation

## 2011-09-21 DIAGNOSIS — R269 Unspecified abnormalities of gait and mobility: Secondary | ICD-10-CM | POA: Insufficient documentation

## 2011-09-23 ENCOUNTER — Ambulatory Visit: Payer: Medicaid Other | Admitting: Physical Therapy

## 2011-09-28 ENCOUNTER — Ambulatory Visit: Payer: Medicaid Other | Admitting: Physical Therapy

## 2011-09-28 ENCOUNTER — Encounter: Payer: Medicaid Other | Attending: Physical Medicine & Rehabilitation | Admitting: Physical Medicine & Rehabilitation

## 2011-09-28 ENCOUNTER — Encounter: Payer: Self-pay | Admitting: Physical Medicine & Rehabilitation

## 2011-09-28 VITALS — BP 136/70 | HR 82 | Resp 16 | Ht 65.0 in | Wt 233.0 lb

## 2011-09-28 DIAGNOSIS — R279 Unspecified lack of coordination: Secondary | ICD-10-CM | POA: Insufficient documentation

## 2011-09-28 DIAGNOSIS — R209 Unspecified disturbances of skin sensation: Secondary | ICD-10-CM | POA: Insufficient documentation

## 2011-09-28 DIAGNOSIS — M5126 Other intervertebral disc displacement, lumbar region: Secondary | ICD-10-CM

## 2011-09-28 DIAGNOSIS — G834 Cauda equina syndrome: Secondary | ICD-10-CM

## 2011-09-28 DIAGNOSIS — M549 Dorsalgia, unspecified: Secondary | ICD-10-CM | POA: Insufficient documentation

## 2011-09-28 NOTE — Progress Notes (Signed)
Subjective:    Patient ID: [name], female    DOB: March 08, 1971, 41 y.o.   MRN: [id number]  [name] is back regarding  Her cauda equina syndrome. She still has some tingling in her right leg. Therapy is working on her balance.  She has 5 visits left with therapy at the outpt center.  She's walking with a cane currently.   Her bowels and bladder are back to baselin. She still has some pain in her back. Her pain is manageable. She uses norco, but takes it infrequently.  She uses the robaxin more than anything else.    Pain Inventory Average Pain 5 Pain Right Now 8 My pain is intermittent, tingling and aching  In the last 24 hours, has pain interfered with the following? General activity 5 Relation with others 0 Enjoyment of life 10 What TIME of day is your pain at its worst? evening Sleep (in general) Fair  Pain is worse with: walking, standing and some activites Pain improves with: heat/ice and medication Relief from Meds: 5  Mobility use a cane how many minutes can you walk? 20 ability to climb steps?  yes do you drive?  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  no  Physicians involved in your care Primary care [facility] Orthopedist Dr Darrelyn Hillock   Family History  Problem Relation Age of Onset  . Anesthesia problems Neg Hx   . Hypertension Maternal Grandmother    History   Social History  . Marital Status: Divorced    Spouse Name: N/A    Number of Children: N/A  . Years of Education: N/A   Social History Main Topics  . Smoking status: Never Smoker   . Smokeless tobacco: Never Used  . Alcohol Use: No  . Drug Use: No  . Sexually Active: Not Currently    Birth Control/ Protection: None     IUD  removed 2 wks ago.   Other Topics Concern  . None   Social History Narrative  . None   Past Surgical History  Procedure Date  . Neck surgery   . Cervical spine surgery   . Myomectomy   . Wisdom tooth extraction   . Cesarean section 1996  . Abdominal hysterectomy 06/29/11    Robotic Assisted Hysterectomy  . Lumbar laminectomy 08/31/2011    Procedure: MICRODISCECTOMY LUMBAR LAMINECTOMY;  Surgeon: Jacki Cones, MD;  Location: [facility];  Service: Orthopedics;  Laterality: N/A;  lumbar five sacral one central microdisectomy   Past Medical History  Diagnosis Date  . DVT (deep venous thrombosis)     ? vs lupus  . Fibroids   . Anemia   . Headache   . Difficult intravenous access     hands are usually best for blood draws and IV starts  . Complication of anesthesia     slow to wake up  . Scoliosis    BP 136/70  Pulse 82  Resp 16  Ht 5\' 5"  (1.651 m)  Wt 233 lb (105.688 kg)  BMI 38.77 kg/m2  SpO2 100%  LMP 02/27/2011   HPI    Review of Systems  HENT: Negative.   Eyes: Negative.   Respiratory: Negative.   Cardiovascular: Negative.   Gastrointestinal: Positive for abdominal pain  and constipation.  Genitourinary: Negative.   Musculoskeletal: Positive for back pain and gait problem.  Skin: Negative.   Neurological: Positive for weakness (right foot) and numbness.  Hematological: Negative.   Psychiatric/Behavioral: Negative.        Objective:   Physical Exam .. General: Alert and oriented x 3 HEENT: Head is normocephalic, atraumatic, PERRLA, EOMI, sclera anicteric, oral mucosa pink and moist, dentition intact, ext ear canals clear,  Neck: Supple without JVD or lymphadenopathy Heart: Reg rate and rhythm. No murmurs rubs or gallops Chest: CTA bilaterally without wheezes, rales, or rhonchi; no distress Abdomen: Soft, non-tender, non-distended, bowel sounds positive. Extremities: No clubbing, cyanosis, or edema. Pulses are 2+ Skin: Clean and intact without signs of  breakdown- woiund clean and intact. Neuro: Pt is cognitively appropriate with normal insight, memory, and awareness. Cranial nerves 2-12 are intact. Sensory exam is normal except RLE which is 1/2.  Reflexes are 1+ in LE's.  Fine motor coordination is intact. No tremors.  Musculoskeletal: lumbar flexion 45 degrees, ext 15 degrees, lat rotation 20 degrees, lat bending 15 degrees. . Posture appropriate Psych: Pt's affect is appropriate. Pt is cooperative         Assessment & Plan:  1. 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 Status post decompressive laminectomy  2. Regarding return to work: she's likely a couple months away from realistically working.  3. Pain Management: Robaxin, hydrocodone 4. Neuropathic pain. Continue neurontin 5. Discussed work simulation activities down the road.  7. Neurogenic bowel and bladder- this has really resolved

## 2011-09-28 NOTE — Patient Instructions (Signed)
Work on basic low back range of motion, flexibility, posture.

## 2011-09-30 ENCOUNTER — Ambulatory Visit: Payer: Medicaid Other | Admitting: Physical Therapy

## 2011-10-05 ENCOUNTER — Ambulatory Visit: Payer: Medicaid Other | Admitting: Physical Therapy

## 2011-10-07 ENCOUNTER — Ambulatory Visit: Payer: Medicaid Other | Admitting: Physical Therapy

## 2011-10-12 ENCOUNTER — Ambulatory Visit: Payer: Medicaid Other | Admitting: Physical Therapy

## 2011-10-15 ENCOUNTER — Ambulatory Visit: Payer: Medicaid Other | Admitting: Physical Therapy

## 2011-10-21 ENCOUNTER — Telehealth: Payer: Self-pay | Admitting: Physical Medicine & Rehabilitation

## 2011-10-21 MED ORDER — GABAPENTIN 100 MG PO CAPS: 100.0000 mg | ORAL_CAPSULE | Freq: Three times a day (TID) | ORAL | Status: DC

## 2011-10-21 NOTE — Telephone Encounter (Signed)
Pt aware that gabapentin has been called in, but Hydrocodone can't be called in until 10/28/11. She will contact us next week for refill.

## 2011-10-21 NOTE — Telephone Encounter (Signed)
Refill on Gabapentin and she thinks Oxycodone.

## 2011-10-29 ENCOUNTER — Telehealth: Payer: Self-pay | Admitting: Physical Medicine & Rehabilitation

## 2011-10-29 NOTE — Telephone Encounter (Signed)
Refill on Hydrocodone 

## 2011-10-30 MED ORDER — HYDROCODONE-ACETAMINOPHEN 5-325 MG PO TABS: 1.0000 | ORAL_TABLET | Freq: Four times a day (QID) | ORAL | Status: DC | PRN

## 2011-10-30 NOTE — Telephone Encounter (Signed)
Rx has been called in, pt aware. 

## 2011-11-30 ENCOUNTER — Encounter: Payer: Medicaid Other | Attending: Physical Medicine & Rehabilitation | Admitting: Physical Medicine & Rehabilitation

## 2011-11-30 ENCOUNTER — Encounter: Payer: Self-pay | Admitting: Physical Medicine & Rehabilitation

## 2011-11-30 VITALS — BP 150/88 | HR 86 | Resp 16 | Ht 65.0 in | Wt 225.0 lb

## 2011-11-30 DIAGNOSIS — M48061 Spinal stenosis, lumbar region without neurogenic claudication: Secondary | ICD-10-CM | POA: Insufficient documentation

## 2011-11-30 DIAGNOSIS — G834 Cauda equina syndrome: Secondary | ICD-10-CM | POA: Insufficient documentation

## 2011-11-30 MED ORDER — BACLOFEN 10 MG PO TABS: 10.0000 mg | ORAL_TABLET | Freq: Three times a day (TID) | ORAL | Status: DC | PRN

## 2011-11-30 MED ORDER — GABAPENTIN 300 MG PO CAPS: 300.0000 mg | ORAL_CAPSULE | Freq: Three times a day (TID) | ORAL | Status: DC

## 2011-11-30 MED ORDER — HYDROCODONE-ACETAMINOPHEN 5-325 MG PO TABS: 1.0000 | ORAL_TABLET | Freq: Four times a day (QID) | ORAL | Status: DC | PRN

## 2011-11-30 MED ORDER — MELOXICAM 15 MG PO TABS: 15.0000 mg | ORAL_TABLET | Freq: Every day | ORAL | Status: DC

## 2011-11-30 NOTE — Patient Instructions (Signed)
NEURONTIN:   FOR DAYS 1-5 TAKE 100-100-300  FOR DAYS 6-10 TAKE 300-100-300 DAYS 11 PLUS TAKE 300MG  THREE X PER DAY.  CONTINIUE REGULAR STRETCHING OF YOUR BACK AND LEGS  CHECK YOUR BLOOD PRESSURE WHILE ON THE MELOXICAM

## 2011-11-30 NOTE — Progress Notes (Signed)
Subjective:    Patient ID: [name], female    DOB: 24-Mar-1970, 41 y.o.   MRN: [id number]  HPI  [name] is back regarding her cauda equina syndrome. Her biggest complaint is spasms/cramps in her back and legs. The robaxin isnt' really helping. The hydrocodone helps somewhat. The spasms seem to happen "whenver they want to".  She also has consistent tingling in her leg. 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 She saw 2 patients the 2nd day and it really got the best of her. She is trying to work on positioning of equipment, chairs, etc to aid her efforts. She uses a straight cane for ambulation currently.   She tells me that [facility] is also seeing her for a right RTC injury.   Pain Inventory Average Pain 7-10 Pain Right Now 10 My pain is sharp  In the last 24 hours, has pain interfered with the following? General activity 9 Relation with others 9 Enjoyment of life 9 What TIME of day is your pain at its worst? All Day Sleep (in general) Fair  Pain is worse with: walking, sitting and standing Pain improves with: Nothing Relief from Meds: 2  Mobility use a cane ability to climb steps?  yes do you drive?  yes  Function I need assistance with the following:  meal prep, household duties and shopping  Neuro/Psych spasms  Prior Studies Any changes since last visit?  no  Physicians involved in your care Any changes since last visit?  no   Family History  Problem Relation Age of Onset  . Anesthesia problems Neg Hx   . Hypertension Maternal Grandmother    History   Social History  . Marital Status: Divorced    Spouse Name: N/A    Number of Children: N/A  . Years of Education: N/A   Social History Main Topics  . Smoking status: Never Smoker   . Smokeless tobacco: Never Used  . Alcohol Use: No  . Drug Use: No  . Sexually Active: Not Currently    Birth Control/ Protection: None     IUD removed 2 wks  ago.   Other Topics Concern  . None   Social History Narrative  . None   Past Surgical History  Procedure Date  . Neck surgery   . Cervical spine surgery   . Myomectomy   . Wisdom tooth extraction   . Cesarean section 1996  . Abdominal hysterectomy 06/29/11    Robotic Assisted Hysterectomy  . Lumbar laminectomy 08/31/2011    Procedure: MICRODISCECTOMY LUMBAR LAMINECTOMY;  Surgeon: Jacki Cones, MD;  Location: [facility];  Service: Orthopedics;  Laterality: N/A;  lumbar five sacral one central microdisectomy   Past Medical History  Diagnosis Date  . DVT (deep venous thrombosis)     ? vs lupus  . Fibroids   . Anemia   . Headache   . Difficult intravenous access     hands are usually best for blood draws and IV starts  . Complication of anesthesia     slow to wake up  . Scoliosis    BP 150/88  Pulse 86  Resp 16  Ht 5\' 5"  (1.651 m)  Wt 225 lb (102.059 kg)  BMI 37.44 kg/m2  SpO2 100%  LMP 02/27/2011      Review of Systems  Constitutional: Negative.   HENT: Negative.   Eyes: Negative.  Respiratory: Negative.   Cardiovascular: Negative.   Gastrointestinal: Negative.   Genitourinary: Negative.   Musculoskeletal: Positive for back pain.  Skin: Negative.   Neurological: Negative.        Spasms  Hematological: Negative.   Psychiatric/Behavioral: Negative.        Objective:   Physical Exam General: Alert and oriented x 3. She remains obese.  HEENT: Head is normocephalic, atraumatic, PERRLA, EOMI, sclera anicteric, oral mucosa pink and moist, dentition intact, ext ear canals clear,  Neck: Supple without JVD or lymphadenopathy  Heart: Reg rate and rhythm. No murmurs rubs or gallops  Chest: CTA bilaterally without wheezes, rales, or rhonchi; no distress  Abdomen: Soft, non-tender, non-distended, bowel sounds positive.  Extremities: No clubbing, cyanosis, or edema. Pulses are 2+  Skin: Clean and intact without signs of breakdown- woiund clean and intact.  Neuro:  Pt is cognitively appropriate with normal insight, memory, and awareness. Cranial nerves 2-12 are intact. Sensory exam is normal except RLE which is 1/2. Reflexes are 1+ in LE's. Fine motor coordination is intact. No tremors. Srtength in the Right HF, ADF, APF, KE, HAB, HE is grossly 3-4/5. HAD and HF was 4-5/5.  LLE grossly 5/5 with pain inhibiion proximally. Musculoskeletal: lumbar flexion 40 degrees, ext 15 degrees, lat rotation 20 degrees, lat bending 15 degrees. . Posture is fair. She has pain with palpation over the lower lumbar spine and paraspinals. Psych: Pt's affect is appropriate. Pt is cooperative   Assessment & Plan:   1. 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 Status post decompressive laminectomy  2. Regarding return to work: she's likely a couple months away from realistically working. If she wants to work she will have to GRADUALLY increase her work load. 3. Pain Management:   hydrocodone  4. Neuropathic pain. Continue neurontin but increase to 300mg  tid, titration schedue provided 5. Added meloxicam for pain. Advised her to watch her BP closely. 6. DC robaxin and try baclofen prn tid for muscle spasms and cramps  7. Neurogenic bowel and bladder- this has really resolved 8. Follow up in 2 months. All questions were encouraged and answered.

## 2011-11-30 NOTE — Addendum Note (Signed)
Addended by: Caryl Ada on: 11/30/2011 01:08 PM   Modules accepted: Orders

## 2011-12-30 ENCOUNTER — Telehealth: Payer: Self-pay | Admitting: Physical Medicine & Rehabilitation

## 2011-12-30 NOTE — Telephone Encounter (Signed)
Patients upper back now hurts.  She does not think she broke anything.  It hurts for her to take a deep breath.  Advised her to make appointment.  She agreed.

## 2011-12-30 NOTE — Telephone Encounter (Signed)
Patient has fallen, does she see Dr Riley Kill or her ortho?

## 2012-01-19 ENCOUNTER — Other Ambulatory Visit: Payer: Self-pay | Admitting: Physical Medicine & Rehabilitation

## 2012-01-19 ENCOUNTER — Telehealth: Payer: Self-pay | Admitting: Physical Medicine & Rehabilitation

## 2012-01-19 MED ORDER — HYDROCODONE-ACETAMINOPHEN 5-325 MG PO TABS: 1.0000 | ORAL_TABLET | Freq: Four times a day (QID) | ORAL | Status: DC | PRN

## 2012-01-19 NOTE — Telephone Encounter (Signed)
Hydrocodone refilled, baclofen refill requested from pharmacy.

## 2012-01-19 NOTE — Telephone Encounter (Signed)
Refill Hydrocodone, Baclofen

## 2012-01-20 ENCOUNTER — Telehealth: Payer: Self-pay | Admitting: Physical Medicine & Rehabilitation

## 2012-01-20 NOTE — Telephone Encounter (Signed)
2nd call for refill on Hydrocodone and Baclofen

## 2012-01-20 NOTE — Telephone Encounter (Signed)
Patient aware baclofen and hydrocodone have been called in.

## 2012-01-20 NOTE — Telephone Encounter (Signed)
Baclofen refilled.

## 2012-02-01 ENCOUNTER — Encounter: Payer: Self-pay | Admitting: Physical Medicine and Rehabilitation

## 2012-02-01 ENCOUNTER — Encounter
Payer: Medicaid Other | Attending: Physical Medicine and Rehabilitation | Admitting: Physical Medicine and Rehabilitation

## 2012-02-01 VITALS — BP 138/77 | HR 91 | Resp 14 | Ht 65.0 in | Wt 231.0 lb

## 2012-02-01 DIAGNOSIS — M5126 Other intervertebral disc displacement, lumbar region: Secondary | ICD-10-CM | POA: Insufficient documentation

## 2012-02-01 DIAGNOSIS — G834 Cauda equina syndrome: Secondary | ICD-10-CM | POA: Insufficient documentation

## 2012-02-01 DIAGNOSIS — K592 Neurogenic bowel, not elsewhere classified: Secondary | ICD-10-CM | POA: Insufficient documentation

## 2012-02-01 DIAGNOSIS — M961 Postlaminectomy syndrome, not elsewhere classified: Secondary | ICD-10-CM

## 2012-02-01 NOTE — Patient Instructions (Signed)
Try to look into aquatic exercises at the aquatic center, continue with the exercises you learned at PT.

## 2012-02-01 NOTE — Progress Notes (Signed)
Subjective:    Patient ID: [name], female    DOB: 1970-08-27, 41 y.o.   MRN: [id number]  HPI [name] is back regarding her cauda equina syndrome. Her biggest complaint is spasms/cramps in her back and legs. The robaxin isnt' really helping. The hydrocodone helps somewhat. The spasms seem to happen "whenver they want to". She also has consistent tingling in her leg. 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 She saw 2 clients the 2nd day and it really got the best of her. She is applying for disability right now. She uses a walker for ambulation currently. Her problem has been stable since last visit.   Pain Inventory Average Pain 9 Pain Right Now 8 My pain is sharp, tingling and aching  In the last 24 hours, has pain interfered with the following? General activity 8 Relation with others 6 Enjoyment of life 9 What TIME of day is your pain at its worst? all the time Sleep (in general) Poor  Pain is worse with: walking, bending, inactivity and standing Pain improves with: medication Relief from Meds: 7  Mobility walk with assistance use a cane use a walker how many minutes can you walk? 30 ability to climb steps?  yes do you drive?  no  Function not employed: date last employed 08/28/11 I need assistance with the following:  household duties and shopping  Neuro/Psych weakness numbness tingling trouble walking spasms dizziness  Prior Studies Any changes since last visit?  no  Physicians involved in your care Any changes since last visit?  no   Family History  Problem Relation Age of Onset  . Anesthesia problems Neg Hx   . Hypertension Maternal Grandmother    History   Social History  . Marital Status: Divorced    Spouse Name: N/A    Number of Children: N/A  . Years of Education: N/A   Social History Main Topics  . Smoking status: Never Smoker   . Smokeless tobacco: Never Used  . Alcohol Use:  No  . Drug Use: No  . Sexually Active: Not Currently    Birth Control/ Protection: None     Comment: IUD removed 2 wks ago.   Other Topics Concern  . None   Social History Narrative  . None   Past Surgical History  Procedure Date  . Neck surgery   . Cervical spine surgery   . Myomectomy   . Wisdom tooth extraction   . Cesarean section 1996  . Abdominal hysterectomy 06/29/11    Robotic Assisted Hysterectomy  . Lumbar laminectomy 08/31/2011    Procedure: MICRODISCECTOMY LUMBAR LAMINECTOMY;  Surgeon: Jacki Cones, MD;  Location: [facility];  Service: Orthopedics;  Laterality: N/A;  lumbar five sacral one central microdisectomy   Past Medical History  Diagnosis Date  . DVT (deep venous thrombosis)     ? vs lupus  . Fibroids   . Anemia   . Headache   . Difficult intravenous access     hands are usually best for blood draws and IV starts  . Complication of anesthesia     slow to wake up  . Scoliosis    BP 138/77  Pulse 91  Resp 14  Ht 5\' 5"  (1.651 m)  Wt 231 lb (104.781 kg)  BMI 38.44 kg/m2  SpO2 100%  LMP 02/27/2011    Review of Systems  Cardiovascular: Positive for leg swelling.  Gastrointestinal: Positive for constipation.  Musculoskeletal: Positive for myalgias, back pain and arthralgias.  Neurological: Positive for weakness and numbness.  All other systems reviewed and are negative.       Objective:   Physical Exam General: Alert and oriented x 3. She remains obese.  HEENT: Head is normocephalic, atraumatic Neck: Supple without JVD or lymphadenopathy    Extremities: No clubbing, cyanosis, or edema. Pulses are 2+  Skin: Clean and intact without signs of breakdown- woiund clean and intact.  Neuro: Pt is cognitively appropriate with normal insight, memory, and awareness. Cranial nerves 2-12 are intact.  Reflexes are 2+ in LE's. Fine motor coordination is intact. No tremors. Strength in the RLE 4/5. LLE  5/5 .  Musculoskeletal: lumbar flexion 40 degrees, ext  15 degrees, lat rotation 20 degrees, lat bending 15 degrees. Posture is fair. She has pain with palpation over the lower lumbar spine and paraspinals. Walks with a walker, able to stand on toes, and stand on heels. Rhomberg negative, no pronator drift. Psych: Pt's affect is appropriate. Pt is cooperative         Assessment & Plan:  1. 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 Status post decompressive laminectomy  2. Regarding return to work: Applying for disability at this point.  3. Pain Management: hydrocodone  4. Neuropathic pain. Continue neurontin but increase to 300mg  tid, titration schedule provided  5. Added meloxicam for pain. Advised her to watch her BP closely.  6. DC robaxin and try baclofen prn tid for muscle spasms and cramps  Patient is doing a little better with her current medication at this point.But she is still not able to stand or sit in one position for a prolonged time. 7. Neurogenic bowel and bladder- this has really resolved  8. Follow up in 2 months. All questions were encouraged and answered. Advised patient to look into exercising in the water, the patient states, that she will try this.

## 2012-02-02 ENCOUNTER — Encounter (HOSPITAL_COMMUNITY): Payer: Self-pay | Admitting: *Deleted

## 2012-02-02 ENCOUNTER — Inpatient Hospital Stay (HOSPITAL_COMMUNITY)
Admission: AD | Admit: 2012-02-02 | Discharge: 2012-02-02 | Disposition: A | Discharge status: Home / Self Care | Payer: Medicaid Other | Source: Ambulatory Visit | Attending: Obstetrics and Gynecology | Admitting: Obstetrics and Gynecology

## 2012-02-02 DIAGNOSIS — K644 Residual hemorrhoidal skin tags: Secondary | ICD-10-CM | POA: Insufficient documentation

## 2012-02-02 DIAGNOSIS — K649 Unspecified hemorrhoids: Secondary | ICD-10-CM

## 2012-02-02 MED ORDER — HYDROCORTISONE ACETATE 25 MG RE SUPP
25.0000 mg | Freq: Once | RECTAL | Status: AC
Start: 2012-02-02 — End: 2012-02-02
  Administered 2012-02-02: 25 mg via RECTAL
  Filled 2012-02-02: qty 1

## 2012-02-02 MED ORDER — LIDOCAINE HCL 2 % EX GEL
Freq: Once | CUTANEOUS | Status: AC
  Administered 2012-02-02: 10 via TOPICAL
  Filled 2012-02-02: qty 20

## 2012-02-02 MED ORDER — HYDROCORTISONE ACETATE 25 MG RE SUPP: 25.0000 mg | Freq: Two times a day (BID) | RECTAL | Status: DC

## 2012-02-02 MED ORDER — LIDOCAINE HCL 2 % EX GEL: CUTANEOUS | Status: DC | PRN

## 2012-02-02 NOTE — MAU Note (Signed)
Pt reports she has had hemorroids for a while for the last 2 days they have been more painful. Some bleeding from area.normally uses PrepH and takes Aleve but this did not help the pain at all

## 2012-02-02 NOTE — MAU Provider Note (Signed)
History     CSN: [id number]  Arrival date and time: 02/02/12 0405   First Provider Initiated Contact with Patient 02/02/12 0453      Chief Complaint  Patient presents with  . Hemorrhoids   HPI  [name] is a 41 y.o. G2P1011 who presents today with hemorrhoid pain. She has tried hydrocodone, aleve, warm bath. The pain started Sunday night.  Past Medical History  Diagnosis Date  . DVT (deep venous thrombosis)     ? vs lupus  . Fibroids   . Anemia   . Headache   . Difficult intravenous access     hands are usually best for blood draws and IV starts  . Complication of anesthesia     slow to wake up  . Scoliosis     Past Surgical History  Procedure Date  . Neck surgery   . Cervical spine surgery   . Myomectomy   . Wisdom tooth extraction   . Cesarean section 1996  . Abdominal hysterectomy 06/29/11    Robotic Assisted Hysterectomy  . Lumbar laminectomy 08/31/2011    Procedure: MICRODISCECTOMY LUMBAR LAMINECTOMY;  Surgeon: Jacki Cones, MD;  Location: [facility];  Service: Orthopedics;  Laterality: N/A;  lumbar five sacral one central microdisectomy    Family History  Problem Relation Age of Onset  . Anesthesia problems Neg Hx   . Hypertension Maternal Grandmother     History  Substance Use Topics  . Smoking status: Never Smoker   . Smokeless tobacco: Never Used  . Alcohol Use: No    Allergies:  Allergies  Allergen Reactions  . Iodine Hives  . Shrimp (Shellfish Allergy) Hives    Prescriptions prior to admission  Medication Sig Dispense Refill  . baclofen (LIORESAL) 10 MG tablet TAKE 1 TABLET (10 MG TOTAL) BY MOUTH 3 (THREE) TIMES DAILY AS NEEDED (SPASM).  90 tablet  1  . gabapentin (NEURONTIN) 300 MG capsule Take 1 capsule (300 mg total) by mouth 3 (three) times daily.  90 capsule  3  . HYDROcodone-acetaminophen (NORCO/VICODIN) 5-325 MG per tablet Take 1 tablet by mouth every 6 (six) hours as needed.  30 tablet  0  . meloxicam (MOBIC) 15 MG tablet  Take 1 tablet (15 mg total) by mouth daily.  30 tablet  3  . Multiple Vitamins-Iron (MULTIVITAMINS WITH IRON) TABS Take 1 tablet by mouth daily.  30 tablet  0  . naproxen sodium (ANAPROX) 220 MG tablet Take 800 mg by mouth 2 (two) times daily with a meal.      . cholecalciferol (VITAMIN D) 1000 UNITS tablet Take 2,000 Units by mouth every morning.         Review of Systems  Constitutional: Negative for fever and chills.  Gastrointestinal: Positive for blood in stool. Negative for nausea, vomiting, abdominal pain, diarrhea and constipation (Last BM on saturday, always very irregular).  Genitourinary: Negative for dysuria, urgency, frequency and hematuria.  Musculoskeletal: Negative for myalgias.   Physical Exam   Blood pressure 142/80, pulse 78, temperature 98.3 F (36.8 C), temperature source Oral, resp. rate 18, height 5\' 5"  (1.651 m), weight 104.781 kg (231 lb), last menstrual period 02/27/2011, SpO2 100.00%.  Physical Exam  Nursing note and vitals reviewed. Constitutional: She is oriented to person, place, and time. She appears well-developed and well-nourished.  Cardiovascular: Normal rate.   Respiratory: Effort normal.  GI: Soft. Bowel sounds are normal. She exhibits no distension. There is no tenderness. There is no rebound and no guarding.  Genitourinary:        External genitalia: normal Rectum: external hemorrhoid 0.5X.05cm. Non-thrombosed No internal hemorrhoids  Neurological: She is alert and oriented to person, place, and time.  Skin: Skin is warm and dry.  Psychiatric: She has a normal mood and affect.    MAU Course  Procedures    Assessment and Plan   1. Hemorrhoid   Lidocaine jelly PRN Hydrocortisone 25mg  PR   Tawnya Crook 02/02/2012, 4:53 AM

## 2012-02-03 ENCOUNTER — Encounter: Payer: Medicaid Other | Admitting: Physical Medicine & Rehabilitation

## 2012-02-03 NOTE — MAU Provider Note (Signed)
Attestation of Attending Supervision of Advanced Practitioner (CNM/NP): Evaluation and management procedures were performed by the Advanced Practitioner under my supervision and collaboration.  I have reviewed the Advanced Practitioner's note and chart, and I agree with the management and plan.  Tabitha Riggins 02/03/2012 10:42 AM

## 2012-02-26 ENCOUNTER — Telehealth: Payer: Self-pay | Admitting: *Deleted

## 2012-02-26 MED ORDER — HYDROCODONE-ACETAMINOPHEN 5-325 MG PO TABS: 1.0000 | ORAL_TABLET | Freq: Four times a day (QID) | ORAL | Status: DC | PRN

## 2012-02-26 NOTE — Telephone Encounter (Signed)
Patient needs refill on Hydrocodone °

## 2012-02-26 NOTE — Telephone Encounter (Signed)
Notified patient prescription was called into [facility] pharmacy.

## 2012-03-19 ENCOUNTER — Encounter (HOSPITAL_COMMUNITY): Payer: Self-pay | Admitting: Emergency Medicine

## 2012-03-19 ENCOUNTER — Emergency Department (HOSPITAL_COMMUNITY)
Admission: EM | Admit: 2012-03-19 | Discharge: 2012-03-19 | Disposition: A | Discharge status: Home / Self Care | Payer: Medicaid Other | Attending: Emergency Medicine | Admitting: Emergency Medicine

## 2012-03-19 ENCOUNTER — Emergency Department (HOSPITAL_COMMUNITY): Payer: Medicaid Other

## 2012-03-19 DIAGNOSIS — S93609A Unspecified sprain of unspecified foot, initial encounter: Secondary | ICD-10-CM

## 2012-03-19 DIAGNOSIS — M4120 Other idiopathic scoliosis, site unspecified: Secondary | ICD-10-CM | POA: Insufficient documentation

## 2012-03-19 DIAGNOSIS — Z8742 Personal history of other diseases of the female genital tract: Secondary | ICD-10-CM | POA: Insufficient documentation

## 2012-03-19 DIAGNOSIS — E1149 Type 2 diabetes mellitus with other diabetic neurological complication: Secondary | ICD-10-CM | POA: Insufficient documentation

## 2012-03-19 DIAGNOSIS — T148XXA Other injury of unspecified body region, initial encounter: Secondary | ICD-10-CM

## 2012-03-19 DIAGNOSIS — I82409 Acute embolism and thrombosis of unspecified deep veins of unspecified lower extremity: Secondary | ICD-10-CM | POA: Insufficient documentation

## 2012-03-19 DIAGNOSIS — S92109A Unspecified fracture of unspecified talus, initial encounter for closed fracture: Secondary | ICD-10-CM | POA: Insufficient documentation

## 2012-03-19 DIAGNOSIS — W010XXA Fall on same level from slipping, tripping and stumbling without subsequent striking against object, initial encounter: Secondary | ICD-10-CM | POA: Insufficient documentation

## 2012-03-19 DIAGNOSIS — Y9389 Activity, other specified: Secondary | ICD-10-CM | POA: Insufficient documentation

## 2012-03-19 DIAGNOSIS — E1142 Type 2 diabetes mellitus with diabetic polyneuropathy: Secondary | ICD-10-CM | POA: Insufficient documentation

## 2012-03-19 DIAGNOSIS — D649 Anemia, unspecified: Secondary | ICD-10-CM | POA: Insufficient documentation

## 2012-03-19 DIAGNOSIS — Y929 Unspecified place or not applicable: Secondary | ICD-10-CM | POA: Insufficient documentation

## 2012-03-19 DIAGNOSIS — Z79899 Other long term (current) drug therapy: Secondary | ICD-10-CM | POA: Insufficient documentation

## 2012-03-19 MED ORDER — HYDROCODONE-ACETAMINOPHEN 5-325 MG PO TABS: 2.0000 | ORAL_TABLET | Freq: Four times a day (QID) | ORAL | Status: DC | PRN

## 2012-03-19 MED ORDER — HYDROCODONE-ACETAMINOPHEN 5-325 MG PO TABS
2.0000 | ORAL_TABLET | Freq: Once | ORAL | Status: AC
  Administered 2012-03-19: 2 via ORAL
  Filled 2012-03-19: qty 2

## 2012-03-19 NOTE — ED Notes (Addendum)
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  States that she can't feel her left foot.  States that she fell and hurt her left foot.  C/o left entire foot pain.

## 2012-03-19 NOTE — ED Notes (Signed)
Pt escorted to discharge window. Pt verbalized understanding discharge instructions. In no acute distress.  

## 2012-03-19 NOTE — ED Provider Notes (Signed)
History     CSN: [id number]  Arrival date & time 03/19/12  6578   First MD Initiated Contact with Patient 03/19/12 [phone]      Chief Complaint  Patient presents with  . Fall  . Foot Pain    (Consider location/radiation/quality/duration/timing/severity/associated sxs/prior treatment) Patient is a 41 y.o. female presenting with fall and lower extremity pain. The history is provided by the patient.  Fall Pertinent negatives include no fever, no numbness and no headaches.  Foot Pain Pertinent negatives include no headaches.  pt sates last pm tripped and fell, landing on left foot. C/o pain to mid foot. Constant, dull, moderate, non radiating worse w wt bear and palpation. Pt states cannot walk on foot due to pain. Skin intact. No ankle pain. No other pain or injury. No numbness/weakness.      Past Medical History  Diagnosis Date  . DVT (deep venous thrombosis)     ? vs lupus  . Fibroids   . Anemia   . Headache   . Difficult intravenous access     hands are usually best for blood draws and IV starts  . Complication of anesthesia     slow to wake up  . Scoliosis     Past Surgical History  Procedure Date  . Neck surgery   . Cervical spine surgery   . Myomectomy   . Wisdom tooth extraction   . Cesarean section 1996  . Abdominal hysterectomy 06/29/11    Robotic Assisted Hysterectomy  . Lumbar laminectomy 08/31/2011    Procedure: MICRODISCECTOMY LUMBAR LAMINECTOMY;  Surgeon: Jacki Cones, MD;  Location: [facility];  Service: Orthopedics;  Laterality: N/A;  lumbar five sacral one central microdisectomy    Family History  Problem Relation Age of Onset  . Anesthesia problems Neg Hx   . Hypertension Maternal Grandmother     History  Substance Use Topics  . Smoking status: Never Smoker   . Smokeless tobacco: Never Used  . Alcohol Use: No    OB History    Grav Para Term Preterm Abortions TAB SAB Ect Mult Living   2 1 1  0 1 0 1 0 0 1      Review of Systems    Constitutional: Negative for fever.  HENT: Negative for neck pain.   Musculoskeletal: Negative for back pain.  Skin: Negative for wound.  Neurological: Negative for weakness, numbness and headaches.    Allergies  Iodine and Shrimp  Home Medications   Current Outpatient Rx  Name  Route  Sig  Dispense  Refill  . BACLOFEN 10 MG PO TABS      TAKE 1 TABLET (10 MG TOTAL) BY MOUTH 3 (THREE) TIMES DAILY AS NEEDED (SPASM).   90 tablet   1   . VITAMIN D 1000 UNITS PO TABS   Oral   Take 2,000 Units by mouth every morning.          Marland Kitchen GABAPENTIN 300 MG PO CAPS   Oral   Take 1 capsule (300 mg total) by mouth 3 (three) times daily.   90 capsule   3   . HYDROCODONE-ACETAMINOPHEN 5-325 MG PO TABS   Oral   Take 1 tablet by mouth every 6 (six) hours as needed.   30 tablet   0   . HYDROCORTISONE ACETATE 25 MG RE SUPP   Rectal   Place 1 suppository (25 mg total) rectally 2 (two) times daily.   14 suppository   0   . LIDOCAINE  HCL 2 % EX GEL   Topical   Apply topically as needed.   30 mL   1   . MELOXICAM 15 MG PO TABS   Oral   Take 1 tablet (15 mg total) by mouth daily.   30 tablet   3   . TAB-A-VITE/IRON PO TABS   Oral   Take 1 tablet by mouth daily.   30 tablet   0   . NAPROXEN SODIUM 220 MG PO TABS   Oral   Take 800 mg by mouth 2 (two) times daily with a meal.           BP 107/79  Pulse 100  Temp 97.6 F (36.4 C) (Oral)  Resp 16  SpO2 100%  LMP 02/27/2011  Physical Exam  Nursing note and vitals reviewed. Constitutional: She appears well-developed and well-nourished. No distress.  HENT:  Head: Atraumatic.  Eyes: Conjunctivae normal are normal. No scleral icterus.  Neck: Neck supple. No tracheal deviation present.  Cardiovascular: Normal rate.   Pulmonary/Chest: Effort normal. No respiratory distress.  Abdominal: Normal appearance.  Musculoskeletal:       Tenderness left proximal to mid foot. Dp/pt palp. Normal cap refill in toes. Skin intact. No  5th mt tenderness.   Neurological: She is alert.  Skin: Skin is warm and dry. No rash noted.  Psychiatric: She has a normal mood and affect.    ED Course  Procedures (including critical care time)  Labs Reviewed - No data to display Dg Foot Complete Left  03/19/2012  *RADIOLOGY REPORT*  Clinical Data: Fall.  Left foot pain and swelling.  LEFT FOOT - COMPLETE 3+ VIEW  Comparison:  None.  Findings: Soft tissue swelling is seen along the dorsum of the midfoot.  There is an avulsion fracture fragment along the dorsal aspect of the distal talus.  No other fractures are identified. Alignment is normal.  IMPRESSION:  Avulsion fracture from the dorsal aspect of the talus.   Original Report Authenticated By: Myles Rosenthal, M.D.       MDM  Xrays. Ice/coldpack.  Pt has ride, does not have to drive. No meds pta.   vicodin po.  Discussed xrays w pt.  Clarified hx w pt, pt denies any hx diabetes.           Suzi Roots, MD 03/19/12 [phone]

## 2012-04-04 ENCOUNTER — Encounter: Payer: Self-pay | Admitting: Physical Medicine and Rehabilitation

## 2012-04-04 ENCOUNTER — Encounter
Payer: Medicaid Other | Attending: Physical Medicine and Rehabilitation | Admitting: Physical Medicine and Rehabilitation

## 2012-04-04 VITALS — BP 131/76 | HR 77 | Resp 14 | Ht 65.0 in | Wt 242.0 lb

## 2012-04-04 DIAGNOSIS — M62838 Other muscle spasm: Secondary | ICD-10-CM | POA: Insufficient documentation

## 2012-04-04 DIAGNOSIS — S92155A Nondisplaced avulsion fracture (chip fracture) of left talus, initial encounter for closed fracture: Secondary | ICD-10-CM

## 2012-04-04 DIAGNOSIS — M48061 Spinal stenosis, lumbar region without neurogenic claudication: Secondary | ICD-10-CM

## 2012-04-04 DIAGNOSIS — G834 Cauda equina syndrome: Secondary | ICD-10-CM | POA: Insufficient documentation

## 2012-04-04 DIAGNOSIS — M5126 Other intervertebral disc displacement, lumbar region: Secondary | ICD-10-CM | POA: Insufficient documentation

## 2012-04-04 MED ORDER — HYDROCODONE-ACETAMINOPHEN 5-325 MG PO TABS: 1.0000 | ORAL_TABLET | Freq: Four times a day (QID) | ORAL | Status: DC | PRN

## 2012-04-04 NOTE — Progress Notes (Signed)
Subjective:    Patient ID: [name], female    DOB: Oct 31, 1970, 42 y.o.   MRN: [id number]  HPI [name] is back regarding her cauda equina syndrome. Her biggest complaint is spasms/cramps in her back and legs. The robaxin isnt' really helping. The hydrocodone helps somewhat. The spasms seem to happen "whenver they want to". She also has consistent tingling in her leg. 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 She saw 2 clients the 2nd day and it really got the best of her. She is applying for disability right now. She uses a walker for ambulation currently. Her problem has been stable since last visit.  Pain Inventory Average Pain 8 Pain Right Now 8 My pain is sharp, tingling and aching  In the last 24 hours, has pain interfered with the following? General activity 8 Relation with others 7 Enjoyment of life 6 What TIME of day is your pain at its worst? all the time Sleep (in general) Poor  Pain is worse with: walking, bending, standing and some activites Pain improves with: medication Relief from Meds: 6  Mobility walk with assistance use a cane use a walker how many minutes can you walk? 20 ability to climb steps?  no do you drive?  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  no  Physicians involved in your care Any changes since last visit?  no   Family History  Problem Relation Age of Onset  . Anesthesia problems Neg Hx   . Hypertension Maternal Grandmother    History   Social History  . Marital Status: Divorced    Spouse Name: N/A    Number of Children: N/A  . Years of Education: N/A   Social History Main Topics  . Smoking status: Never Smoker   . Smokeless  tobacco: Never Used  . Alcohol Use: No  . Drug Use: No  . Sexually Active: Not Currently    Birth Control/ Protection: None     Comment: IUD removed 2 wks ago.   Other Topics Concern  . None   Social History Narrative  . None   Past Surgical History  Procedure Date  . Neck surgery   . Cervical spine surgery   . Myomectomy   . Wisdom tooth extraction   . Cesarean section 1996  . Abdominal hysterectomy 06/29/11    Robotic Assisted Hysterectomy  . Lumbar laminectomy 08/31/2011    Procedure: MICRODISCECTOMY LUMBAR LAMINECTOMY;  Surgeon: Jacki Cones, MD;  Location: [facility];  Service: Orthopedics;  Laterality: N/A;  lumbar five sacral one central microdisectomy   Past Medical History  Diagnosis Date  . DVT (deep venous thrombosis)     ? vs lupus  . Fibroids   . Anemia   . Headache   . Difficult intravenous access     hands are usually best for blood draws and IV starts  . Complication of anesthesia     slow to wake up  . Scoliosis    BP 131/76  Pulse 77  Resp 14  Ht 5\' 5"  (1.651 m)  Wt 242 lb (109.77 kg)  BMI 40.27 kg/m2  SpO2 100%  LMP 02/27/2011    Review of Systems  Constitutional: Positive for unexpected weight change.  Cardiovascular: Positive for leg swelling.  Gastrointestinal: Positive for nausea and constipation.  Musculoskeletal: Positive for back pain and gait problem.  Neurological: Positive for weakness and numbness.       Spasms   All other systems reviewed and are negative.       Objective:   Physical Exam General: Alert and oriented x 3. She remains obese.  HEENT: Head is normocephalic, atraumatic  Neck: Supple without JVD or lymphadenopathy  Extremities: No clubbing, cyanosis, or edema. Pulses are 2+  Skin: Clean and intact without signs of breakdown- woiund clean and intact.  Neuro: Pt is cognitively appropriate with normal insight, memory, and awareness. Cranial nerves 2-12 are intact. Reflexes are 2+ in LE's. Fine motor coordination  is intact. No tremors. Strength in the RLE 4/5. LLE 5/5 .  Musculoskeletal: lumbar flexion 40 degrees, ext 15 degrees, lat rotation 20 degrees, lat bending 15 degrees. Posture is fair. She has pain with palpation over the lower lumbar spine and paraspinals. Walks with a walker, able to stand on toes, and stand on heels. Rhomberg negative, no pronator drift.  Psych: Pt's affect is appropriate. Pt is cooperative         Assessment & Plan:  1. 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 Status post decompressive laminectomy  2. Regarding return to work: Applying for disability at this point.  3. Pain Management: hydrocodone  4. Neuropathic pain. Continue neurontin but increase to 300mg  tid, titration schedule provided  5. Added meloxicam for pain. Advised her to watch her BP closely.  6.  baclofen prn tid for muscle spasms and cramps  Patient is doing a little better with her current medication at this point.But she is still not able to stand or sit in one position for a prolonged time.  7. Neurogenic bowel and bladder- this has really resolved  8. Follow up in 2 months. All questions were encouraged and answered.  Advised patient to look into exercising in the water, the patient states, that she will try this. Spent at least 25 min. With patient, also discussed forms she needs to be filled out for her application for disability. She did not have all the papers with her today, we are waiting for the rest to be faxed.

## 2012-04-04 NOTE — Patient Instructions (Signed)
Continue with the exercises you learned from PT.Follow up with the orthopedic surgeon for your foot.

## 2012-05-02 ENCOUNTER — Encounter: Payer: Self-pay | Admitting: Physical Medicine and Rehabilitation

## 2012-05-02 ENCOUNTER — Encounter
Payer: Medicaid Other | Attending: Physical Medicine and Rehabilitation | Admitting: Physical Medicine and Rehabilitation

## 2012-05-02 VITALS — BP 139/65 | HR 82 | Resp 14 | Ht 65.0 in | Wt 244.0 lb

## 2012-05-02 DIAGNOSIS — Z86718 Personal history of other venous thrombosis and embolism: Secondary | ICD-10-CM | POA: Insufficient documentation

## 2012-05-02 DIAGNOSIS — M79609 Pain in unspecified limb: Secondary | ICD-10-CM | POA: Insufficient documentation

## 2012-05-02 DIAGNOSIS — M961 Postlaminectomy syndrome, not elsewhere classified: Secondary | ICD-10-CM

## 2012-05-02 DIAGNOSIS — M48061 Spinal stenosis, lumbar region without neurogenic claudication: Secondary | ICD-10-CM

## 2012-05-02 DIAGNOSIS — M5126 Other intervertebral disc displacement, lumbar region: Secondary | ICD-10-CM | POA: Insufficient documentation

## 2012-05-02 DIAGNOSIS — Z5181 Encounter for therapeutic drug level monitoring: Secondary | ICD-10-CM

## 2012-05-02 DIAGNOSIS — G834 Cauda equina syndrome: Secondary | ICD-10-CM

## 2012-05-02 DIAGNOSIS — IMO0001 Reserved for inherently not codable concepts without codable children: Secondary | ICD-10-CM | POA: Insufficient documentation

## 2012-05-02 MED ORDER — MELOXICAM 15 MG PO TABS: 15.0000 mg | ORAL_TABLET | Freq: Every day | ORAL | Status: DC

## 2012-05-02 MED ORDER — HYDROCODONE-ACETAMINOPHEN 5-325 MG PO TABS: 1.0000 | ORAL_TABLET | Freq: Three times a day (TID) | ORAL | Status: DC | PRN

## 2012-05-02 NOTE — Progress Notes (Signed)
Subjective:    Patient ID: [name], female    DOB: 30-Dec-1970, 42 y.o.   MRN: [id number]  HPI Evolet is back regarding her low back pain, Hx of cauda equina s. and Laminectomy, June 2013. Her biggest complaint is muscle and joint pain in her back and legs.  The hydrocodone helps somewhat. The spasms are not bothering her that much anymore. She also has consistent tingling in her leg. 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 She applied for disability, which was denied . She still uses a walker for ambulation, mainly for stability. Her problem has been stable since last visit.   Pain Inventory Average Pain 8 Pain Right Now 8 My pain is sharp and aching  In the last 24 hours, has pain interfered with the following? General activity 8 Relation with others 5 Enjoyment of life 8 What TIME of day is your pain at its worst? all the time Sleep (in general) Poor  Pain is worse with: walking, bending and standing Pain improves with: heat/ice, therapy/exercise and medication Relief from Meds: 7  Mobility use a walker ability to climb steps?  no  Function not employed: date last employed 08/2011 I need assistance with the following:  dressing, bathing, meal prep, household duties and shopping  Neuro/Psych trouble walking spasms  Prior Studies Any changes since last visit?  no  Physicians involved in your care Any changes since last visit?  no   Family History  Problem Relation Age of Onset  . Anesthesia problems Neg Hx   . Hypertension Maternal Grandmother    History   Social History  . Marital Status: Divorced    Spouse Name: N/A    Number of Children: N/A  . Years of Education: N/A   Social History Main Topics  . Smoking status: Never Smoker   . Smokeless tobacco: Never Used  . Alcohol Use: No  . Drug Use: No  . Sexually Active: Not Currently    Birth Control/ Protection: None     Comment: IUD  removed 2 wks ago.   Other Topics Concern  . None   Social History Narrative  . None   Past Surgical History  Procedure Laterality Date  . Neck surgery    . Cervical spine surgery    . Myomectomy    . Wisdom tooth extraction    . Cesarean section  1996  . Abdominal hysterectomy  06/29/11    Robotic Assisted Hysterectomy  . Lumbar laminectomy  08/31/2011    Procedure: MICRODISCECTOMY LUMBAR LAMINECTOMY;  Surgeon: Jacki Cones, MD;  Location: [facility];  Service: Orthopedics;  Laterality: N/A;  lumbar five sacral one central microdisectomy   Past Medical History  Diagnosis Date  . DVT (deep venous thrombosis)     ? vs lupus  . Fibroids   . Anemia   . Headache   . Difficult intravenous access     hands are usually best for blood draws and IV starts  . Complication of anesthesia     slow to wake up  . Scoliosis    BP 139/65  Pulse 82  Resp 14  Ht 5\' 5"  (1.651 m)  Wt 244 lb (110.678 kg)  BMI 40.6 kg/m2  SpO2 97%  LMP 02/27/2011     Review of Systems  Gastrointestinal: Positive for nausea and constipation.  Musculoskeletal: Positive for back pain and gait problem.  All other systems reviewed and are negative.       Objective:   Physical Exam General: Alert and oriented x 3. She remains obese.  HEENT: Head is normocephalic, atraumatic  Neck: Supple without JVD or lymphadenopathy  Extremities: No clubbing, cyanosis, or edema. Pulses are 2+  Skin: Clean and intact without signs of breakdown- woiund clean and intact.  Neuro: Pt is cognitively appropriate with normal insight, memory, and awareness. Cranial nerves 2-12 are intact. Reflexes are 2+ in LE's. Fine motor coordination is intact. No tremors. Strength in the RLE 4/5. LLE 5/5 .  Musculoskeletal: lumbar flexion 40 degrees, ext 15 degrees, lat rotation 20 degrees, lat bending 15 degrees. Posture is fair. She has pain with palpation over the lower lumbar spine and paraspinals. Walks with a walker, able to stand on  toes, and stand on heels. Rhomberg negative, no pronator drift.  Psych: Pt's affect is appropriate. Pt is cooperative         Assessment & Plan:  1. 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 Status post decompressive laminectomy , recommended to follow up with her spine surgeon to evaluate her healing process. 2. 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   3. Pain Management: hydrocodone  4. Neuropathic pain. Continue neurontin but increase to 300mg  tid, titration schedule provided  5. Added meloxicam for her joint and muscle pain, aching. Advised her to watch her BP closely.  6. baclofen prn tid for muscle spasms and cramps  Patient is doing a little better with her current medication at this point.But she is still not able to stand or sit in one position for a prolonged time.  7. Neurogenic bowel and bladder- this has really resolved  8. Follow up in 2 months. All questions were encouraged and answered.  Advised patient to look into exercising in the water, the patient states, that she will try this.  Spent at least 25 min. with patient, face to face. Patient would like to have some home health assistance, she states, that she struggles with her daily activities because of her pain. I ordered evaluation by home health.

## 2012-05-02 NOTE — Patient Instructions (Signed)
Try to stay as active as pain allows. 

## 2012-05-31 ENCOUNTER — Telehealth: Payer: Self-pay

## 2012-05-31 ENCOUNTER — Ambulatory Visit: Payer: Medicaid Other | Admitting: Physical Medicine and Rehabilitation

## 2012-05-31 NOTE — Telephone Encounter (Signed)
Cindy with [facility] called to say patient needs personal care services not home health.  This is a different entity and she will fax the referral form.

## 2012-06-24 ENCOUNTER — Other Ambulatory Visit: Payer: Self-pay | Admitting: Internal Medicine

## 2012-06-24 DIAGNOSIS — Z1231 Encounter for screening mammogram for malignant neoplasm of breast: Secondary | ICD-10-CM

## 2012-07-01 ENCOUNTER — Encounter
Payer: Medicaid Other | Attending: Physical Medicine and Rehabilitation | Admitting: Physical Medicine and Rehabilitation

## 2012-07-01 ENCOUNTER — Encounter: Payer: Self-pay | Admitting: Physical Medicine and Rehabilitation

## 2012-07-01 VITALS — BP 141/71 | HR 84 | Resp 14 | Ht 65.0 in | Wt 243.0 lb

## 2012-07-01 DIAGNOSIS — G834 Cauda equina syndrome: Secondary | ICD-10-CM | POA: Insufficient documentation

## 2012-07-01 DIAGNOSIS — M48061 Spinal stenosis, lumbar region without neurogenic claudication: Secondary | ICD-10-CM | POA: Insufficient documentation

## 2012-07-01 MED ORDER — GABAPENTIN 300 MG PO CAPS: 300.0000 mg | ORAL_CAPSULE | Freq: Three times a day (TID) | ORAL | Status: DC

## 2012-07-01 MED ORDER — MELOXICAM 15 MG PO TABS: 15.0000 mg | ORAL_TABLET | Freq: Every day | ORAL | Status: DC

## 2012-07-01 MED ORDER — BACLOFEN 10 MG PO TABS: ORAL_TABLET | ORAL | Status: DC

## 2012-07-01 MED ORDER — HYDROCODONE-ACETAMINOPHEN 5-325 MG PO TABS: 1.0000 | ORAL_TABLET | Freq: Three times a day (TID) | ORAL | Status: DC | PRN

## 2012-07-01 NOTE — Progress Notes (Signed)
Subjective:    Patient ID: [name], female    DOB: 22-Jan-1971, 42 y.o.   MRN: [id number]  HPI [name] is back regarding her low back pain, Hx of cauda equina s. and Laminectomy, June 2013. Her biggest complaint is muscle and joint pain in her back and legs. The hydrocodone helps somewhat. The spasms are not bothering her that much anymore. She also has consistent tingling in her leg. 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 She applied for disability, which was denied . She is walking with a cane today.  Her problem has been stable since last visit.  Pain Inventory Average Pain 9 Pain Right Now 8 My pain is stabbing, tingling and aching  In the last 24 hours, has pain interfered with the following? General activity 0 Relation with others 0 Enjoyment of life 0 What TIME of day is your pain at its worst? all the time Sleep (in general) Poor  Pain is worse with: walking, bending and standing Pain improves with: medication Relief from Meds: 4  Mobility use a cane how many minutes can you walk? 30 ability to climb steps?  yes  Function I need assistance with the following:  meal prep, household duties and shopping  Neuro/Psych weakness numbness trouble walking spasms  Prior Studies Any changes since last visit?  no  Physicians involved in your care Any changes since last visit?  no   Family History  Problem Relation Age of Onset  . Anesthesia problems Neg Hx   . Hypertension Maternal Grandmother    History   Social History  . Marital Status: Divorced    Spouse Name: N/A    Number of Children: N/A  . Years of Education: N/A   Social History Main Topics  . Smoking status: Never Smoker   . Smokeless tobacco: Never Used  . Alcohol Use: No  . Drug Use: No  . Sexually Active: Not Currently    Birth Control/ Protection: None     Comment: IUD removed 2 wks ago.   Other Topics Concern  . None    Social History Narrative  . None   Past Surgical History  Procedure Laterality Date  . Neck surgery    . Cervical spine surgery    . Myomectomy    . Wisdom tooth extraction    . Cesarean section  1996  . Abdominal hysterectomy  06/29/11    Robotic Assisted Hysterectomy  . Lumbar laminectomy  08/31/2011    Procedure: MICRODISCECTOMY LUMBAR LAMINECTOMY;  Surgeon: Jacki Cones, MD;  Location: [facility];  Service: Orthopedics;  Laterality: N/A;  lumbar five sacral one central microdisectomy   Past Medical History  Diagnosis Date  . DVT (deep venous thrombosis)     ? vs lupus  . Fibroids   . Anemia   . Headache   . Difficult intravenous access     hands are usually best for blood draws and IV starts  . Complication of anesthesia     slow to wake up  . Scoliosis    BP 141/71  Pulse 84  Resp 14  Ht 5\' 5"  (1.651 m)  Wt 243 lb (110.224 kg)  BMI 40.44 kg/m2  SpO2 99%  LMP 02/27/2011     Review of Systems  Constitutional: Positive for unexpected weight change.  Gastrointestinal: Positive for constipation.  Musculoskeletal: Positive for back pain, joint swelling and gait  problem.  Neurological: Positive for weakness and numbness.  All other systems reviewed and are negative.       Objective:   Physical Exam General: Alert and oriented x 3. She remains obese.  HEENT: Head is normocephalic, atraumatic  Neck: Supple without JVD or lymphadenopathy  Extremities: No clubbing, cyanosis, or edema. Pulses are 2+  Skin: Clean and intact without signs of breakdown- woiund clean and intact.  Neuro: Pt is cognitively appropriate with normal insight, memory, and awareness. Cranial nerves 2-12 are intact. Reflexes are 2+ in LE's. Fine motor coordination is intact. No tremors. Strength in the RLE 4/5. LLE 5/5 .  Musculoskeletal: lumbar flexion 40 degrees, ext 15 degrees, lat rotation 20 degrees, lat bending 15 degrees. Posture is fair. She has pain with palpation over the lower lumbar  spine and paraspinals. Walks with a cane, able to stand on toes, and stand on heels. Rhomberg negative, no pronator drift.  Psych: Pt's affect is appropriate. Pt is cooperative         Assessment & Plan:  1. 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 Status post decompressive laminectomy , recommended to follow up with her spine surgeon to evaluate her healing process, which she did, and Dr. Wynelle Cleveland told her that the x-rays of her L-spine looked fine, per patient.  2. 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  3. Pain Management: hydrocodone  4. Neuropathic pain. Continue neurontin but increase to 300mg  tid, titration schedule provided  5. Added meloxicam for her joint and muscle pain, aching. Advised her to watch her BP closely.  6. baclofen prn tid for muscle spasms and cramps  Patient is doing a little better with her current medication at this point.But she is still not able to stand or sit in one position for a prolonged time.  7. Neurogenic bowel and bladder- this has really resolved  8. Follow up in 2 months. All questions were encouraged and answered.  Advised patient to look into exercising in the water, the patient states, that she will try this.  Follow up in 2 month.

## 2012-07-01 NOTE — Patient Instructions (Signed)
Try to start with aquatic exercising, as tolerated.

## 2012-08-01 ENCOUNTER — Ambulatory Visit
Admission: RE | Admit: 2012-08-01 | Discharge: 2012-08-01 | Disposition: A | Discharge status: Home / Self Care | Payer: Medicaid Other | Source: Ambulatory Visit | Attending: Internal Medicine | Admitting: Internal Medicine

## 2012-08-01 DIAGNOSIS — Z1231 Encounter for screening mammogram for malignant neoplasm of breast: Secondary | ICD-10-CM

## 2012-08-29 ENCOUNTER — Encounter: Payer: Self-pay | Admitting: Physical Medicine and Rehabilitation

## 2012-08-29 ENCOUNTER — Encounter
Payer: Medicaid Other | Attending: Physical Medicine and Rehabilitation | Admitting: Physical Medicine and Rehabilitation

## 2012-08-29 VITALS — BP 125/73 | HR 82 | Resp 14 | Ht 65.0 in | Wt 244.4 lb

## 2012-08-29 DIAGNOSIS — M545 Low back pain, unspecified: Secondary | ICD-10-CM | POA: Insufficient documentation

## 2012-08-29 DIAGNOSIS — IMO0001 Reserved for inherently not codable concepts without codable children: Secondary | ICD-10-CM | POA: Insufficient documentation

## 2012-08-29 DIAGNOSIS — R252 Cramp and spasm: Secondary | ICD-10-CM | POA: Insufficient documentation

## 2012-08-29 DIAGNOSIS — M62838 Other muscle spasm: Secondary | ICD-10-CM | POA: Insufficient documentation

## 2012-08-29 DIAGNOSIS — G834 Cauda equina syndrome: Secondary | ICD-10-CM | POA: Insufficient documentation

## 2012-08-29 DIAGNOSIS — K592 Neurogenic bowel, not elsewhere classified: Secondary | ICD-10-CM | POA: Insufficient documentation

## 2012-08-29 DIAGNOSIS — R209 Unspecified disturbances of skin sensation: Secondary | ICD-10-CM | POA: Insufficient documentation

## 2012-08-29 DIAGNOSIS — M79609 Pain in unspecified limb: Secondary | ICD-10-CM | POA: Insufficient documentation

## 2012-08-29 DIAGNOSIS — M961 Postlaminectomy syndrome, not elsewhere classified: Secondary | ICD-10-CM

## 2012-08-29 DIAGNOSIS — M2550 Pain in unspecified joint: Secondary | ICD-10-CM | POA: Insufficient documentation

## 2012-08-29 DIAGNOSIS — M5126 Other intervertebral disc displacement, lumbar region: Secondary | ICD-10-CM | POA: Insufficient documentation

## 2012-08-29 MED ORDER — HYDROCODONE-ACETAMINOPHEN 5-325 MG PO TABS: 1.0000 | ORAL_TABLET | Freq: Three times a day (TID) | ORAL | Status: DC | PRN

## 2012-08-29 NOTE — Patient Instructions (Signed)
Try to stay as active as tolerated 

## 2012-08-29 NOTE — Progress Notes (Signed)
Subjective:    Patient ID: [name], female    DOB: Sep 04, 1970, 42 y.o.   MRN: [id number]  HPI [name] is back regarding her low back pain, Hx of cauda equina s. and Laminectomy, June 2013. Her biggest complaint is muscle and joint pain in her back and legs. The hydrocodone helps somewhat. The spasms are not bothering her that much anymore. She also has consistent tingling in her leg. 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 She applied for disability, which was denied , she has re-applied . She is walking with a cane today. Her problem has been stable since last visit, although she reports, that she has started aquatic exercises, and has a little increased pain after the exercises.  Pain Inventory Average Pain 8 Pain Right Now 8 My pain is constant, tingling and aching  In the last 24 hours, has pain interfered with the following? General activity 7 Relation with others 4 Enjoyment of life 8 What TIME of day is your pain at its worst? all Sleep (in general) Poor  Pain is worse with: walking, bending, standing and some activites Pain improves with: medication Relief from Meds: 3  Mobility use a cane how many minutes can you walk? 3 ability to climb steps?  yes do you drive?  yes  Function not employed: date last employed 09/07/11 I need assistance with the following:  household duties and shopping  Neuro/Psych numbness tingling spasms  Prior Studies Any changes since last visit?  no  Physicians involved in your care Any changes since last visit?  no   Family History  Problem Relation Age of Onset  . Anesthesia problems Neg Hx   . Hypertension Maternal Grandmother    History   Social History  . Marital Status: Divorced    Spouse Name: N/A    Number of Children: N/A  . Years of Education: N/A   Social History Main Topics  . Smoking status: Never Smoker   . Smokeless tobacco: Never Used  .  Alcohol Use: No  . Drug Use: No  . Sexually Active: Not Currently    Birth Control/ Protection: None     Comment: IUD removed 2 wks ago.   Other Topics Concern  . None   Social History Narrative  . None   Past Surgical History  Procedure Laterality Date  . Neck surgery    . Cervical spine surgery    . Myomectomy    . Wisdom tooth extraction    . Cesarean section  1996  . Abdominal hysterectomy  06/29/11    Robotic Assisted Hysterectomy  . Lumbar laminectomy  08/31/2011    Procedure: MICRODISCECTOMY LUMBAR LAMINECTOMY;  Surgeon: Jacki Cones, MD;  Location: [facility];  Service: Orthopedics;  Laterality: N/A;  lumbar five sacral one central microdisectomy   Past Medical History  Diagnosis Date  . DVT (deep venous thrombosis)     ? vs lupus  . Fibroids   . Anemia   . Headache(784.0)   . Difficult intravenous access     hands are usually best for blood draws and IV starts  . Complication of anesthesia     slow to wake up  . Scoliosis    BP 125/73  Pulse 82  Resp 14  Ht 5\' 5"  (1.651 m)  Wt 244 lb 6.4 oz (110.859 kg)  BMI 40.67 kg/m2  SpO2 99%  LMP  02/27/2011    Review of Systems  Cardiovascular: Positive for leg swelling.  Musculoskeletal: Positive for back pain.       Spasms  Neurological: Positive for numbness.       Tingling  All other systems reviewed and are negative.       Objective:   Physical Exam General: Alert and oriented x 3. She remains obese.  HEENT: Head is normocephalic, atraumatic  Neck: Supple without JVD or lymphadenopathy  Extremities: No clubbing, cyanosis, or edema. Pulses are 2+  Skin: Clean and intact without signs of breakdown- woiund clean and intact.  Neuro: Pt is cognitively appropriate with normal insight, memory, and awareness. Cranial nerves 2-12 are intact. Reflexes are 2+ in LE's. Fine motor coordination is intact. No tremors. Strength in the RLE 4/5. LLE 5/5 .  Musculoskeletal: lumbar flexion 40 degrees, ext 15 degrees, lat  rotation 20 degrees, lat bending 15 degrees. Posture is fair. She has pain with palpation over the lower lumbar spine and paraspinals. Walks with a cane, able to stand on toes, and stand on heels. Rhomberg negative, no pronator drift.  Psych: Pt's affect is appropriate. Pt is cooperative         Assessment & Plan:  1. 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 Status post decompressive laminectomy , recommended to follow up with her spine surgeon to evaluate her healing process, which she did, and Dr. Wynelle Cleveland told her that the x-rays of her L-spine looked fine, per patient.  2. 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  3. Pain Management: hydrocodone  4. Neuropathic pain. Continue neurontin but increase to 300mg  tid, titration schedule provided  5. Added meloxicam for her joint and muscle pain, aching. Advised her to watch her BP closely.  6. baclofen prn tid for muscle spasms and cramps  Patient is doing a little better with her current medication at this point.But she is still not able to stand or sit in one position for a prolonged time.  7. Neurogenic bowel and bladder- this has really resolved  8. Follow up in 2 months. All questions were encouraged and answered.  Advised patient to continue with her exercising in the water, and take her Mobic on that days, if her pain increases. the patient states, that she will try this.  Follow up in 2 month.

## 2012-11-10 ENCOUNTER — Other Ambulatory Visit: Payer: Self-pay | Admitting: Orthopedic Surgery

## 2012-11-10 DIAGNOSIS — Z981 Arthrodesis status: Secondary | ICD-10-CM

## 2012-11-10 DIAGNOSIS — M542 Cervicalgia: Secondary | ICD-10-CM

## 2012-11-16 ENCOUNTER — Ambulatory Visit
Admission: RE | Admit: 2012-11-16 | Discharge: 2012-11-16 | Disposition: A | Discharge status: Home / Self Care | Payer: Self-pay | Source: Ambulatory Visit | Attending: Orthopedic Surgery | Admitting: Orthopedic Surgery

## 2012-11-16 ENCOUNTER — Other Ambulatory Visit: Payer: Self-pay | Admitting: Orthopedic Surgery

## 2012-11-16 ENCOUNTER — Ambulatory Visit
Admission: RE | Admit: 2012-11-16 | Discharge: 2012-11-16 | Disposition: A | Discharge status: Home / Self Care | Payer: Medicaid Other | Source: Ambulatory Visit | Attending: Orthopedic Surgery | Admitting: Orthopedic Surgery

## 2012-11-16 VITALS — BP 119/64 | HR 66

## 2012-11-16 DIAGNOSIS — M542 Cervicalgia: Secondary | ICD-10-CM

## 2012-11-16 DIAGNOSIS — Z981 Arthrodesis status: Secondary | ICD-10-CM

## 2012-11-16 DIAGNOSIS — M549 Dorsalgia, unspecified: Secondary | ICD-10-CM

## 2012-11-16 MED ORDER — MEPERIDINE HCL 100 MG/ML IJ SOLN
75.0000 mg | Freq: Once | INTRAMUSCULAR | Status: AC
  Administered 2012-11-16: 75 mg via INTRAMUSCULAR

## 2012-11-16 MED ORDER — DIPHENHYDRAMINE HCL 50 MG/ML IJ SOLN
50.0000 mg | Freq: Once | INTRAMUSCULAR | Status: AC
  Administered 2012-11-16: 50 mg via INTRAMUSCULAR

## 2012-11-16 MED ORDER — DIAZEPAM 5 MG PO TABS
10.0000 mg | ORAL_TABLET | Freq: Once | ORAL | Status: AC
  Administered 2012-11-16: 10 mg via ORAL

## 2012-11-16 MED ORDER — IOHEXOL 300 MG/ML  SOLN
10.0000 mL | Freq: Once | INTRAMUSCULAR | Status: AC | PRN
  Administered 2012-11-16: 10 mL via INTRATHECAL

## 2012-11-16 MED ORDER — DIPHENHYDRAMINE HCL 50 MG PO CAPS
50.0000 mg | ORAL_CAPSULE | Freq: Once | ORAL | Status: AC
  Administered 2012-11-16: 50 mg via ORAL

## 2012-11-16 MED ORDER — ONDANSETRON HCL 4 MG/2ML IJ SOLN
4.0000 mg | Freq: Once | INTRAMUSCULAR | Status: AC
  Administered 2012-11-16: 4 mg via INTRAMUSCULAR

## 2012-11-23 ENCOUNTER — Encounter: Payer: Self-pay | Admitting: Physical Medicine and Rehabilitation

## 2012-11-23 ENCOUNTER — Encounter
Payer: Medicaid Other | Attending: Physical Medicine and Rehabilitation | Admitting: Physical Medicine and Rehabilitation

## 2012-11-23 VITALS — BP 123/60 | HR 84 | Resp 14 | Ht 65.0 in | Wt 235.0 lb

## 2012-11-23 DIAGNOSIS — IMO0002 Reserved for concepts with insufficient information to code with codable children: Secondary | ICD-10-CM | POA: Insufficient documentation

## 2012-11-23 DIAGNOSIS — Z981 Arthrodesis status: Secondary | ICD-10-CM | POA: Insufficient documentation

## 2012-11-23 DIAGNOSIS — Z5181 Encounter for therapeutic drug level monitoring: Secondary | ICD-10-CM

## 2012-11-23 DIAGNOSIS — Z79899 Other long term (current) drug therapy: Secondary | ICD-10-CM | POA: Insufficient documentation

## 2012-11-23 DIAGNOSIS — G834 Cauda equina syndrome: Secondary | ICD-10-CM | POA: Insufficient documentation

## 2012-11-23 DIAGNOSIS — M5020 Other cervical disc displacement, unspecified cervical region: Secondary | ICD-10-CM | POA: Insufficient documentation

## 2012-11-23 DIAGNOSIS — M5126 Other intervertebral disc displacement, lumbar region: Secondary | ICD-10-CM | POA: Insufficient documentation

## 2012-11-23 DIAGNOSIS — M961 Postlaminectomy syndrome, not elsewhere classified: Secondary | ICD-10-CM

## 2012-11-23 MED ORDER — HYDROCODONE-ACETAMINOPHEN 5-325 MG PO TABS: 1.0000 | ORAL_TABLET | Freq: Four times a day (QID) | ORAL | Status: DC | PRN

## 2012-11-23 NOTE — Progress Notes (Signed)
Subjective:    Patient ID: [name], female    DOB: 31-Jan-1971, 42 y.o.   MRN: [id number]  HPI [name] is back regarding her low back pain, Hx of cauda equina s. and Laminectomy, June 2013. Her biggest complaint is muscle and joint pain in her back and legs. The hydrocodone helps somewhat. The spasms are not bothering her that much anymore. She also has consistent tingling in her leg. 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 She applied for disability, which was denied , she has re-applied . She is walking with a cane today. Her problem has been stable since last visit, although she reports, that she has started aquatic exercises, and has a little increased pain after the exercises.  Pain Inventory Average Pain 10 Pain Right Now 10 My pain is constant, tingling and aching  In the last 24 hours, has pain interfered with the following? General activity 10 Relation with others 8 Enjoyment of life 10 What TIME of day is your pain at its worst? all day Sleep (in general) Poor  Pain is worse with: walking and sitting Pain improves with: medication Relief from Meds: 8  Mobility walk without assistance use a cane how many minutes can you walk? 10 ability to climb steps?  yes do you drive?  yes Do you have any goals in this area?  yes  Function not employed: date last employed na I need assistance with the following:  household duties and shopping Do you have any goals in this area?  yes  Neuro/Psych weakness trouble walking depression  Prior Studies Any changes since last visit?  no  Physicians involved in your care Any changes since last visit?  no   Family History  Problem Relation Age of Onset  . Anesthesia problems Neg Hx   . Hypertension Maternal Grandmother    History   Social History  . Marital Status: Divorced    Spouse Name: N/A    Number of Children: N/A  . Years of Education: N/A   Social  History Main Topics  . Smoking status: Never Smoker   . Smokeless tobacco: Never Used  . Alcohol Use: No  . Drug Use: No  . Sexual Activity: Not Currently    Birth Control/ Protection: None     Comment: IUD removed 2 wks ago.   Other Topics Concern  . None   Social History Narrative  . None   Past Surgical History  Procedure Laterality Date  . Neck surgery    . Cervical spine surgery    . Myomectomy    . Wisdom tooth extraction    . Cesarean section  1996  . Abdominal hysterectomy  06/29/11    Robotic Assisted Hysterectomy  . Lumbar laminectomy  08/31/2011    Procedure: MICRODISCECTOMY LUMBAR LAMINECTOMY;  Surgeon: Jacki Cones, MD;  Location: [facility];  Service: Orthopedics;  Laterality: N/A;  lumbar five sacral one central microdisectomy   Past Medical History  Diagnosis Date  . DVT (deep venous thrombosis)     ? vs lupus  . Fibroids   . Anemia   . Headache(784.0)   . Difficult intravenous access     hands are usually best for blood draws and IV starts  . Complication of anesthesia     slow to wake up  . Scoliosis   . Neck pain    BP 123/60  Pulse  84  Resp 14  Ht 5\' 5"  (1.651 m)  Wt 235 lb (106.595 kg)  BMI 39.11 kg/m2  SpO2 99%  LMP 02/27/2011     Review of Systems  Constitutional: Positive for diaphoresis and unexpected weight change.  Cardiovascular: Positive for leg swelling.  Gastrointestinal: Positive for nausea and constipation.  Musculoskeletal: Positive for gait problem.  Neurological: Positive for numbness.  Psychiatric/Behavioral: Positive for dysphoric mood.       Objective:   Physical Exam General: Alert and oriented x 3. She remains obese.  HEENT: Head is normocephalic, atraumatic  Neck: Supple without JVD or lymphadenopathy  Extremities: No clubbing, cyanosis, or edema. Pulses are 2+  Skin: Clean and intact without signs of breakdown- woiund clean and intact.  Neuro: Pt is cognitively appropriate with normal insight, memory, and  awareness. Cranial nerves 2-12 are intact. Reflexes are 2+ in LE's. Fine motor coordination is intact. No tremors. Strength in the RLE 4/5. LLE 5/5 .  Musculoskeletal: lumbar flexion 40 degrees, ext 15 degrees, lat rotation 20 degrees, lat bending 15 degrees. Posture is fair. She has pain with palpation over the lower lumbar spine and paraspinals. Walks with a cane, able to stand on toes, and stand on heels. Rhomberg negative, no pronator drift.  Psych: Pt's affect is appropriate. Pt is cooperative         Assessment & Plan:  1. 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 Status post decompressive laminectomy , recommended to follow up with her spine surgeon to evaluate her healing process, which she did, and Dr. Wynelle Cleveland told her that the x-rays of her L-spine looked fine, per patient.  2. 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  3. Pain Management: hydrocodone increased from 5mg  bid to tid, patient has increased neck pain, see note below. 4. Neuropathic pain. Continue neurontin but increase to 300mg  tid, titration schedule provided  5. Added meloxicam for her joint and muscle pain, aching. Advised her to watch her BP closely.  6. baclofen prn tid for muscle spasms and cramps  Patient is doing a little better with her current medication at this point.But she is still not able to stand or sit in one position for a prolonged time.  7.Neck pain, radiating to the left UE, Hx of ACDF.Dr. Shon Baton ordered a CT-myelogram, which showed C4-C5: Central disc extrusion is present with caudal migration of  disc material in the midline. Central disc extrusion is present.  There is both cranial and caudal migration of disc material. This  produces mild central stenosis, indenting the ventral cervical cord  at C4-C5. The foramina are patent.  C5-C6: Findings compatible with pseudoarthrosis of C5-C6. There  is loosening of the C5 ACDF  screws and sclerosis along the C5-C6  disc space. The facet joints remain open. There is no bony  foraminal stenosis identified and minimal residual uncovertebral  spurring. She will follow up with Dr. Shon Baton . 8. Follow up in 2 months. All questions were encouraged and answered.  Advised patient to continue with her exercising in the water,as tolerated and take her Mobic on that days, if her pain increases. .  Follow up in 3 month.

## 2012-11-23 NOTE — Patient Instructions (Addendum)
Follow up with Dr. Shon Baton for your neck

## 2013-01-20 ENCOUNTER — Encounter (HOSPITAL_COMMUNITY): Payer: Self-pay | Admitting: Pharmacy Technician

## 2013-01-25 ENCOUNTER — Ambulatory Visit (HOSPITAL_COMMUNITY)
Admission: RE | Admit: 2013-01-25 | Discharge: 2013-01-25 | Disposition: A | Discharge status: Home / Self Care | Payer: Medicaid Other | Source: Ambulatory Visit | Attending: Orthopedic Surgery | Admitting: Orthopedic Surgery

## 2013-01-25 ENCOUNTER — Encounter (HOSPITAL_COMMUNITY)
Admission: RE | Admit: 2013-01-25 | Discharge: 2013-01-25 | Disposition: A | Discharge status: Home / Self Care | Payer: Medicaid Other | Source: Ambulatory Visit | Attending: Orthopedic Surgery | Admitting: Orthopedic Surgery

## 2013-01-25 ENCOUNTER — Other Ambulatory Visit (HOSPITAL_COMMUNITY): Payer: Self-pay | Admitting: *Deleted

## 2013-01-25 ENCOUNTER — Encounter (HOSPITAL_COMMUNITY): Payer: Self-pay

## 2013-01-25 DIAGNOSIS — Z01812 Encounter for preprocedural laboratory examination: Secondary | ICD-10-CM | POA: Insufficient documentation

## 2013-01-25 DIAGNOSIS — Z01818 Encounter for other preprocedural examination: Secondary | ICD-10-CM | POA: Insufficient documentation

## 2013-01-25 DIAGNOSIS — Z01811 Encounter for preprocedural respiratory examination: Secondary | ICD-10-CM | POA: Insufficient documentation

## 2013-01-25 HISTORY — DX: Unspecified osteoarthritis, unspecified site: M19.90

## 2013-01-25 LAB — BASIC METABOLIC PANEL
Chloride: 101 mEq/L (ref 96–112)
Creatinine, Ser: 0.71 mg/dL (ref 0.50–1.10)
GFR calc Af Amer: >90 mL/min (ref >90)
GFR calc non Af Amer: >90 mL/min (ref >90)
Glucose, Bld: 82 mg/dL (ref 70–99)
Potassium: 3.7 mEq/L (ref 3.5–5.1)

## 2013-01-25 LAB — SURGICAL PCR SCREEN
MRSA, PCR: NEGATIVE
Staphylococcus aureus: NEGATIVE

## 2013-01-25 LAB — CBC
Hemoglobin: 12.3 g/dL (ref 12.0–15.0)
RBC: 4.23 MIL/uL (ref 3.87–5.11)
WBC: 5.2 10*3/uL (ref 4.0–10.5)

## 2013-01-25 NOTE — Pre-Procedure Instructions (Addendum)
Shantana A Loja  01/25/2013   Your procedure is scheduled on:  02/02/13  Report to [facility]  2 * 3 at 530 AM.  Call this number if you have problems the morning of surgery: [phone]   Remember:   Do not eat food or drink liquids after midnight.   Take these medicines the morning of surgery with A SIP OF WATER: pain med if needed, gabapentin          STOP meloxicam, phenteramine 01/27/13   Do not wear jewelry, make-up or nail polish.  Do not wear lotions, powders, or perfumes. You may wear deodorant.  Do not shave 48 hours prior to surgery. Men may shave face and neck.  Do not bring valuables to the hospital.  [facility] is not responsible                  for any belongings or valuables.               Contacts, dentures or bridgework may not be worn into surgery.  Leave suitcase in the car. After surgery it may be brought to your room.  For patients admitted to the hospital, discharge time is determined by your                treatment team.               Patients discharged the day of surgery will not be allowed to drive  home.  Name and phone number of your driver:   Special Instructions: Shower using CHG 2 nights before surgery and the night before surgery.  If you shower the day of surgery use CHG.  Use special wash - you have one bottle of CHG for all showers.  You should use approximately 1/3 of the bottle for each shower.   Please read over the following fact sheets that you were given: Pain Booklet, Coughing and Deep Breathing, MRSA Information and Surgical Site Infection Prevention

## 2013-02-01 MED ORDER — CEFAZOLIN SODIUM-DEXTROSE 2-3 GM-% IV SOLR
2.0000 g | INTRAVENOUS | Status: DC
  Filled 2013-02-01: qty 50

## 2013-02-02 ENCOUNTER — Encounter (HOSPITAL_COMMUNITY)
Admission: RE | Disposition: A | Discharge status: Home / Self Care | Payer: Self-pay | Source: Ambulatory Visit | Attending: Orthopedic Surgery

## 2013-02-02 ENCOUNTER — Encounter (HOSPITAL_COMMUNITY): Payer: Self-pay | Admitting: Anesthesiology

## 2013-02-02 ENCOUNTER — Observation Stay (HOSPITAL_COMMUNITY): Payer: Medicaid Other

## 2013-02-02 ENCOUNTER — Encounter (HOSPITAL_COMMUNITY): Payer: Medicaid Other | Admitting: Anesthesiology

## 2013-02-02 ENCOUNTER — Ambulatory Visit (HOSPITAL_COMMUNITY)
Admission: RE | Admit: 2013-02-02 | Discharge: 2013-02-03 | Disposition: A | Discharge status: Home / Self Care | Payer: Medicaid Other | Source: Ambulatory Visit | Attending: Orthopedic Surgery | Admitting: Orthopedic Surgery

## 2013-02-02 ENCOUNTER — Ambulatory Visit (HOSPITAL_COMMUNITY): Payer: Medicaid Other | Admitting: Anesthesiology

## 2013-02-02 ENCOUNTER — Ambulatory Visit (HOSPITAL_COMMUNITY): Payer: Medicaid Other

## 2013-02-02 DIAGNOSIS — T84498A Other mechanical complication of other internal orthopedic devices, implants and grafts, initial encounter: Secondary | ICD-10-CM | POA: Insufficient documentation

## 2013-02-02 DIAGNOSIS — Z981 Arthrodesis status: Secondary | ICD-10-CM | POA: Insufficient documentation

## 2013-02-02 DIAGNOSIS — Z6839 Body mass index (BMI) 39.0-39.9, adult: Secondary | ICD-10-CM | POA: Insufficient documentation

## 2013-02-02 DIAGNOSIS — Y831 Surgical operation with implant of artificial internal device as the cause of abnormal reaction of the patient, or of later complication, without mention of misadventure at the time of the procedure: Secondary | ICD-10-CM | POA: Insufficient documentation

## 2013-02-02 DIAGNOSIS — M542 Cervicalgia: Secondary | ICD-10-CM | POA: Insufficient documentation

## 2013-02-02 DIAGNOSIS — E663 Overweight: Secondary | ICD-10-CM | POA: Insufficient documentation

## 2013-02-02 HISTORY — PX: POSTERIOR FUSION CERVICAL SPINE: SUR628

## 2013-02-02 HISTORY — PX: POSTERIOR CERVICAL FUSION/FORAMINOTOMY: SHX5038

## 2013-02-02 SURGERY — POSTERIOR CERVICAL FUSION/FORAMINOTOMY LEVEL 1
Anesthesia: General | Site: Neck | Wound class: Clean

## 2013-02-02 MED ORDER — LACTATED RINGERS IV SOLN
INTRAVENOUS | Status: DC
  Administered 2013-02-02: 15:00:00 via INTRAVENOUS

## 2013-02-02 MED ORDER — ROCURONIUM BROMIDE 100 MG/10ML IV SOLN
INTRAVENOUS | Status: DC | PRN
  Administered 2013-02-02: 50 mg via INTRAVENOUS

## 2013-02-02 MED ORDER — HYDROMORPHONE HCL PF 1 MG/ML IJ SOLN
INTRAMUSCULAR | Status: AC
  Administered 2013-02-02: 0.5 mg via INTRAVENOUS
  Filled 2013-02-02: qty 1

## 2013-02-02 MED ORDER — THROMBIN 20000 UNITS EX SOLR
CUTANEOUS | Status: AC
  Filled 2013-02-02: qty 20000

## 2013-02-02 MED ORDER — GABAPENTIN 300 MG PO CAPS
300.0000 mg | ORAL_CAPSULE | Freq: Three times a day (TID) | ORAL | Status: DC
  Administered 2013-02-02 – 2013-02-03 (×3): 300 mg via ORAL
  Filled 2013-02-02 (×6): qty 1

## 2013-02-02 MED ORDER — VANCOMYCIN HCL IN DEXTROSE 1-5 GM/200ML-% IV SOLN
1000.0000 mg | Freq: Two times a day (BID) | INTRAVENOUS | Status: AC
  Administered 2013-02-02 – 2013-02-03 (×2): 1000 mg via INTRAVENOUS
  Filled 2013-02-02 (×2): qty 200

## 2013-02-02 MED ORDER — SODIUM CHLORIDE 0.9 % IV SOLN: 250.0000 mL | INTRAVENOUS | Status: DC

## 2013-02-02 MED ORDER — LIDOCAINE HCL (CARDIAC) 20 MG/ML IV SOLN
INTRAVENOUS | Status: DC | PRN
  Administered 2013-02-02: 100 mg via INTRAVENOUS

## 2013-02-02 MED ORDER — ACETAMINOPHEN 10 MG/ML IV SOLN
1000.0000 mg | Freq: Four times a day (QID) | INTRAVENOUS | Status: DC
  Filled 2013-02-02 (×4): qty 100

## 2013-02-02 MED ORDER — OXYCODONE HCL 5 MG PO TABS
10.0000 mg | ORAL_TABLET | ORAL | Status: DC | PRN
  Administered 2013-02-02 – 2013-02-03 (×5): 10 mg via ORAL
  Filled 2013-02-02 (×5): qty 2

## 2013-02-02 MED ORDER — MIDAZOLAM HCL 5 MG/5ML IJ SOLN
INTRAMUSCULAR | Status: DC | PRN
  Administered 2013-02-02: 2 mg via INTRAVENOUS

## 2013-02-02 MED ORDER — BACLOFEN 10 MG PO TABS
10.0000 mg | ORAL_TABLET | Freq: Three times a day (TID) | ORAL | Status: DC | PRN
  Filled 2013-02-02: qty 1

## 2013-02-02 MED ORDER — MORPHINE SULFATE 2 MG/ML IJ SOLN
1.0000 mg | INTRAMUSCULAR | Status: DC | PRN
  Administered 2013-02-02 (×4): 2 mg via INTRAVENOUS
  Administered 2013-02-03: 4 mg via INTRAVENOUS
  Filled 2013-02-02 (×3): qty 1
  Filled 2013-02-02 (×2): qty 2
  Filled 2013-02-02: qty 1

## 2013-02-02 MED ORDER — HYDROMORPHONE HCL PF 1 MG/ML IJ SOLN
0.2500 mg | INTRAMUSCULAR | Status: DC | PRN
  Administered 2013-02-02 (×4): 0.5 mg via INTRAVENOUS

## 2013-02-02 MED ORDER — OXYCODONE-ACETAMINOPHEN 5-325 MG PO TABS: 1.0000 | ORAL_TABLET | ORAL | Status: DC | PRN

## 2013-02-02 MED ORDER — VANCOMYCIN HCL IN DEXTROSE 1-5 GM/200ML-% IV SOLN
INTRAVENOUS | Status: AC
  Administered 2013-02-02: 1000 mg via INTRAVENOUS
  Filled 2013-02-02: qty 200

## 2013-02-02 MED ORDER — 0.9 % SODIUM CHLORIDE (POUR BTL) OPTIME
TOPICAL | Status: DC | PRN
  Administered 2013-02-02: 1000 mL

## 2013-02-02 MED ORDER — FENTANYL CITRATE 0.05 MG/ML IJ SOLN: 50.0000 ug | Freq: Once | INTRAMUSCULAR | Status: DC

## 2013-02-02 MED ORDER — METHOCARBAMOL 500 MG PO TABS: 500.0000 mg | ORAL_TABLET | Freq: Four times a day (QID) | ORAL | Status: DC | PRN

## 2013-02-02 MED ORDER — OXYCODONE HCL 5 MG PO TABS: 5.0000 mg | ORAL_TABLET | Freq: Once | ORAL | Status: DC | PRN

## 2013-02-02 MED ORDER — DEXAMETHASONE SODIUM PHOSPHATE 4 MG/ML IJ SOLN
4.0000 mg | Freq: Four times a day (QID) | INTRAMUSCULAR | Status: DC
  Administered 2013-02-02 – 2013-02-03 (×2): 4 mg via INTRAVENOUS
  Filled 2013-02-02 (×8): qty 1

## 2013-02-02 MED ORDER — ACETAMINOPHEN 10 MG/ML IV SOLN
1000.0000 mg | Freq: Four times a day (QID) | INTRAVENOUS | Status: AC
  Administered 2013-02-02 – 2013-02-03 (×4): 1000 mg via INTRAVENOUS
  Filled 2013-02-02 (×8): qty 100

## 2013-02-02 MED ORDER — BUPIVACAINE-EPINEPHRINE PF 0.25-1:200000 % IJ SOLN
INTRAMUSCULAR | Status: AC
  Filled 2013-02-02: qty 30

## 2013-02-02 MED ORDER — DEXAMETHASONE 4 MG PO TABS
4.0000 mg | ORAL_TABLET | Freq: Four times a day (QID) | ORAL | Status: DC
  Administered 2013-02-02 – 2013-02-03 (×3): 4 mg via ORAL
  Filled 2013-02-02 (×8): qty 1

## 2013-02-02 MED ORDER — NEOSTIGMINE METHYLSULFATE 1 MG/ML IJ SOLN
INTRAMUSCULAR | Status: DC | PRN
  Administered 2013-02-02: 4 mg via INTRAVENOUS

## 2013-02-02 MED ORDER — BUPIVACAINE-EPINEPHRINE 0.25% -1:200000 IJ SOLN
INTRAMUSCULAR | Status: DC | PRN
  Administered 2013-02-02: 5 mL

## 2013-02-02 MED ORDER — ZOLPIDEM TARTRATE 5 MG PO TABS: 5.0000 mg | ORAL_TABLET | Freq: Every evening | ORAL | Status: DC | PRN

## 2013-02-02 MED ORDER — MENTHOL 3 MG MT LOZG
1.0000 | LOZENGE | OROMUCOSAL | Status: DC | PRN
  Filled 2013-02-02: qty 9

## 2013-02-02 MED ORDER — SODIUM CHLORIDE 0.9 % IJ SOLN
3.0000 mL | Freq: Two times a day (BID) | INTRAMUSCULAR | Status: DC
  Administered 2013-02-02: 3 mL via INTRAVENOUS

## 2013-02-02 MED ORDER — SUFENTANIL CITRATE 50 MCG/ML IV SOLN
INTRAVENOUS | Status: DC | PRN
  Administered 2013-02-02: 15 ug via INTRAVENOUS
  Administered 2013-02-02 (×2): 10 ug via INTRAVENOUS
  Administered 2013-02-02: 15 ug via INTRAVENOUS
  Administered 2013-02-02: 10 ug via INTRAVENOUS

## 2013-02-02 MED ORDER — DEXAMETHASONE SODIUM PHOSPHATE 4 MG/ML IJ SOLN
INTRAMUSCULAR | Status: AC
  Administered 2013-02-02: 6 mg via INTRAVENOUS
  Administered 2013-02-02: 4 mg via INTRAVENOUS
  Filled 2013-02-02: qty 1

## 2013-02-02 MED ORDER — PHENOL 1.4 % MT LIQD: 1.0000 | OROMUCOSAL | Status: DC | PRN

## 2013-02-02 MED ORDER — HEMOSTATIC AGENTS (NO CHARGE) OPTIME
TOPICAL | Status: DC | PRN
  Administered 2013-02-02: 1 via TOPICAL

## 2013-02-02 MED ORDER — LACTATED RINGERS IV SOLN
INTRAVENOUS | Status: DC | PRN
  Administered 2013-02-02: 08:00:00 via INTRAVENOUS

## 2013-02-02 MED ORDER — DEXAMETHASONE SODIUM PHOSPHATE 4 MG/ML IJ SOLN: 4.0000 mg | Freq: Once | INTRAMUSCULAR | Status: DC

## 2013-02-02 MED ORDER — DOCUSATE SODIUM 100 MG PO CAPS
100.0000 mg | ORAL_CAPSULE | Freq: Every day | ORAL | Status: DC
  Administered 2013-02-03: 100 mg via ORAL
  Filled 2013-02-02: qty 1

## 2013-02-02 MED ORDER — ONDANSETRON HCL 4 MG/2ML IJ SOLN
4.0000 mg | INTRAMUSCULAR | Status: DC | PRN
  Administered 2013-02-02 (×2): 4 mg via INTRAVENOUS
  Filled 2013-02-02 (×3): qty 2

## 2013-02-02 MED ORDER — LACTATED RINGERS IV SOLN
INTRAVENOUS | Status: DC | PRN
  Administered 2013-02-02: 07:00:00 via INTRAVENOUS

## 2013-02-02 MED ORDER — DOCUSATE SODIUM 100 MG PO CAPS: 100.0000 mg | ORAL_CAPSULE | Freq: Two times a day (BID) | ORAL | Status: DC

## 2013-02-02 MED ORDER — PROMETHAZINE HCL 25 MG/ML IJ SOLN: 6.2500 mg | INTRAMUSCULAR | Status: DC | PRN

## 2013-02-02 MED ORDER — POLYETHYLENE GLYCOL 3350 17 G PO PACK: 17.0000 g | PACK | Freq: Every day | ORAL | Status: DC

## 2013-02-02 MED ORDER — MIDAZOLAM HCL 2 MG/2ML IJ SOLN: 1.0000 mg | INTRAMUSCULAR | Status: DC | PRN

## 2013-02-02 MED ORDER — PROPOFOL 10 MG/ML IV BOLUS
INTRAVENOUS | Status: DC | PRN
  Administered 2013-02-02: 160 mg via INTRAVENOUS

## 2013-02-02 MED ORDER — ACETAMINOPHEN 10 MG/ML IV SOLN
1000.0000 mg | Freq: Four times a day (QID) | INTRAVENOUS | Status: DC
  Administered 2013-02-02: 1000 mg via INTRAVENOUS
  Filled 2013-02-02: qty 100

## 2013-02-02 MED ORDER — GLYCOPYRROLATE 0.2 MG/ML IJ SOLN
INTRAMUSCULAR | Status: DC | PRN
  Administered 2013-02-02: 0.4 mg via INTRAVENOUS

## 2013-02-02 MED ORDER — ONDANSETRON 4 MG PO TBDP: 4.0000 mg | ORAL_TABLET | Freq: Three times a day (TID) | ORAL | Status: DC | PRN

## 2013-02-02 MED ORDER — SODIUM CHLORIDE 0.9 % IJ SOLN: 3.0000 mL | INTRAMUSCULAR | Status: DC | PRN

## 2013-02-02 MED ORDER — VANCOMYCIN HCL IN DEXTROSE 1-5 GM/200ML-% IV SOLN
1000.0000 mg | Freq: Once | INTRAVENOUS | Status: DC
  Filled 2013-02-02: qty 200

## 2013-02-02 MED ORDER — THROMBIN 20000 UNITS EX SOLR
OROMUCOSAL | Status: DC | PRN
  Administered 2013-02-02: 09:00:00 via TOPICAL

## 2013-02-02 MED ORDER — VANCOMYCIN HCL IN DEXTROSE 1-5 GM/200ML-% IV SOLN
1000.0000 mg | Freq: Two times a day (BID) | INTRAVENOUS | Status: DC
  Filled 2013-02-02: qty 200

## 2013-02-02 MED ORDER — DIPHENHYDRAMINE HCL 25 MG PO CAPS
25.0000 mg | ORAL_CAPSULE | Freq: Four times a day (QID) | ORAL | Status: DC | PRN
  Administered 2013-02-02 – 2013-02-03 (×4): 25 mg via ORAL
  Filled 2013-02-02 (×5): qty 1

## 2013-02-02 MED ORDER — OXYCODONE HCL 5 MG/5ML PO SOLN: 5.0000 mg | Freq: Once | ORAL | Status: DC | PRN

## 2013-02-02 SURGICAL SUPPLY — 73 items
BIT DRILL MOUNTAINEER FIX 14 (BIT) ×1
BIT DRILL MOUNTAINEER FIX 14MM (BIT) ×1 IMPLANT
BIT DRILL MOUNTAINER FXED 12MM (DRILL) ×1 IMPLANT
BLADE SURG ROTATE 9660 (MISCELLANEOUS) ×2 IMPLANT
CHLORAPREP W/TINT 26ML (MISCELLANEOUS) ×2 IMPLANT
CLOTH BEACON ORANGE TIMEOUT ST (SAFETY) ×2 IMPLANT
CLSR STERI-STRIP ANTIMIC 1/2X4 (GAUZE/BANDAGES/DRESSINGS) ×2 IMPLANT
CORDS BIPOLAR (ELECTRODE) ×2 IMPLANT
COVER MAYO STAND STRL (DRAPES) ×4 IMPLANT
COVER SURGICAL LIGHT HANDLE (MISCELLANEOUS) ×2 IMPLANT
DERMABOND ADVANCED (GAUZE/BANDAGES/DRESSINGS) ×1
DERMABOND ADVANCED .7 DNX12 (GAUZE/BANDAGES/DRESSINGS) ×1 IMPLANT
DRAPE C-ARM 42X72 X-RAY (DRAPES) ×4 IMPLANT
DRAPE POUCH INSTRU U-SHP 10X18 (DRAPES) ×2 IMPLANT
DRAPE SURG 17X23 STRL (DRAPES) ×2 IMPLANT
DRAPE U-SHAPE 47X51 STRL (DRAPES) ×2 IMPLANT
DRILL BIT MOUNTAINEER FIX 14MM (BIT) ×1
DRILL MOUNTAINEER FIXED 12MM (DRILL) ×2
DRSG ADAPTIC 3X8 NADH LF (GAUZE/BANDAGES/DRESSINGS) ×2 IMPLANT
DRSG MEPILEX BORDER 4X4 (GAUZE/BANDAGES/DRESSINGS) ×2 IMPLANT
DRSG MEPILEX BORDER 4X8 (GAUZE/BANDAGES/DRESSINGS) ×2 IMPLANT
DURAPREP 26ML APPLICATOR (WOUND CARE) IMPLANT
ELECT BLADE 4.0 EZ CLEAN MEGAD (MISCELLANEOUS) ×2
ELECT BLADE 6.5 EXT (BLADE) ×2 IMPLANT
ELECT CAUTERY BLADE 6.4 (BLADE) ×2 IMPLANT
ELECT REM PT RETURN 9FT ADLT (ELECTROSURGICAL) ×2
ELECTRODE BLDE 4.0 EZ CLN MEGD (MISCELLANEOUS) ×1 IMPLANT
ELECTRODE REM PT RTRN 9FT ADLT (ELECTROSURGICAL) ×1 IMPLANT
EVACUATOR 1/8 PVC DRAIN (DRAIN) ×2 IMPLANT
GLOVE BIOGEL PI IND STRL 8 (GLOVE) ×1 IMPLANT
GLOVE BIOGEL PI IND STRL 8.5 (GLOVE) ×1 IMPLANT
GLOVE BIOGEL PI INDICATOR 8 (GLOVE) ×1
GLOVE BIOGEL PI INDICATOR 8.5 (GLOVE) ×1
GLOVE ECLIPSE 8.5 STRL (GLOVE) ×2 IMPLANT
GLOVE ORTHO TXT STRL SZ7.5 (GLOVE) ×2 IMPLANT
GOWN PREVENTION PLUS XXLARGE (GOWN DISPOSABLE) ×2 IMPLANT
GOWN STRL NON-REIN LRG LVL3 (GOWN DISPOSABLE) ×2 IMPLANT
GOWN STRL REIN 2XL XLG LVL4 (GOWN DISPOSABLE) ×2 IMPLANT
IV CATH 14GX2 1/4 (CATHETERS) ×2 IMPLANT
KIT BASIN OR (CUSTOM PROCEDURE TRAY) ×2 IMPLANT
KIT POSITION SURG JACKSON T1 (MISCELLANEOUS) ×2 IMPLANT
KIT ROOM TURNOVER OR (KITS) ×2 IMPLANT
NEEDLE 22X1 1/2 (OR ONLY) (NEEDLE) ×2 IMPLANT
NS IRRIG 1000ML POUR BTL (IV SOLUTION) ×2 IMPLANT
PACK LAMINECTOMY ORTHO (CUSTOM PROCEDURE TRAY) ×2 IMPLANT
PACK UNIVERSAL I (CUSTOM PROCEDURE TRAY) ×2 IMPLANT
PAD ARMBOARD 7.5X6 YLW CONV (MISCELLANEOUS) ×4 IMPLANT
PATTIES SURGICAL .5 X.5 (GAUZE/BANDAGES/DRESSINGS) IMPLANT
PATTIES SURGICAL .5 X1 (DISPOSABLE) ×2 IMPLANT
ROD MOUNTAINEER 3.5X60 (Rod) ×2 IMPLANT
SCREW F A 3.5X14 (Screw) ×8 IMPLANT
SCREW INNER (Screw) ×8 IMPLANT
SHEET CONFORM 45LX20WX5H (Bone Implant) ×2 IMPLANT
SPONGE LAP 4X18 X RAY DECT (DISPOSABLE) ×4 IMPLANT
SPONGE SURGIFOAM ABS GEL 100 (HEMOSTASIS) ×2 IMPLANT
STRIP CLOSURE SKIN 1/2X4 (GAUZE/BANDAGES/DRESSINGS) ×2 IMPLANT
SURGIFLO TRUKIT (HEMOSTASIS) ×2 IMPLANT
SURGILUBE 2OZ TUBE FLIPTOP (MISCELLANEOUS) ×2 IMPLANT
SUT ETHILON 3 0 FSL (SUTURE) IMPLANT
SUT PDS AB 1 CTX 36 (SUTURE) ×2 IMPLANT
SUT PROLENE 0 CT (SUTURE) ×2 IMPLANT
SUT PROLENE 2 0 CT2 30 (SUTURE) ×2 IMPLANT
SUT VIC AB 0 CTB1 27 (SUTURE) ×4 IMPLANT
SUT VIC AB 1 CTX 36 (SUTURE) ×2
SUT VIC AB 1 CTX36XBRD ANBCTR (SUTURE) ×2 IMPLANT
SUT VIC AB 2-0 CT1 18 (SUTURE) ×2 IMPLANT
SYR BULB IRRIGATION 50ML (SYRINGE) ×2 IMPLANT
SYR CONTROL 10ML LL (SYRINGE) ×2 IMPLANT
TOWEL OR 17X24 6PK STRL BLUE (TOWEL DISPOSABLE) ×2 IMPLANT
TOWEL OR 17X26 10 PK STRL BLUE (TOWEL DISPOSABLE) ×2 IMPLANT
TRAY FOLEY CATH 16FRSI W/METER (SET/KITS/TRAYS/PACK) ×2 IMPLANT
WATER STERILE IRR 1000ML POUR (IV SOLUTION) ×2 IMPLANT
YANKAUER SUCT BULB TIP NO VENT (SUCTIONS) ×2 IMPLANT

## 2013-02-02 NOTE — Preoperative (Signed)
Beta Blockers   Reason not to administer Beta Blockers:Not Applicable 

## 2013-02-02 NOTE — Transfer of Care (Signed)
Immediate Anesthesia Transfer of Care Note  Patient: [name]  Procedure(s) Performed: Procedure(s): POSTERIOR C5-6 SPINAL FUSION (N/A)  Patient Location: PACU  Anesthesia Type:General  Level of Consciousness: awake and sedated  Airway & Oxygen Therapy: Patient Spontanous Breathing and Patient connected to nasal cannula oxygen  Post-op Assessment: Report given to PACU RN, Post -op Vital signs reviewed and stable, Patient moving all extremities and Patient moving all extremities X 4  Post vital signs: Reviewed and stable  Complications: No apparent anesthesia complications

## 2013-02-02 NOTE — Anesthesia Procedure Notes (Signed)
Procedure Name: Intubation Date/Time: 02/02/2013 7:47 AM Performed by: Coralee Rud Pre-anesthesia Checklist: Patient identified, Emergency Drugs available, Suction available and Patient being monitored Patient Re-evaluated:Patient Re-evaluated prior to inductionOxygen Delivery Method: Circle system utilized Preoxygenation: Pre-oxygenation with 100% oxygen Intubation Type: IV induction Ventilation: Mask ventilation without difficulty Laryngoscope size: Elective Glidescope. Grade View: Grade I Tube type: Oral Tube size: 7.5 mm Number of attempts: 1 Airway Equipment and Method: Stylet Placement Confirmation: ETT inserted through vocal cords under direct vision,  positive ETCO2 and breath sounds checked- equal and bilateral Secured at: 22 cm Tube secured with: Tape Dental Injury: Teeth and Oropharynx as per pre-operative assessment  Comments: Patient describes painful neck motion, elective Glidescope.

## 2013-02-02 NOTE — H&P (Signed)
History of Present Illness  The patient is a 42 year old female who presents today for follow up of their neck. The patient is being followed for their central neck pain. They are 1 month(s) out from last visit. Symptoms reported today include: pain (entire posterior neck, left shoulder that radiates into left hand), pain at night, aching, throbbing, stiffness, pain with overhead motions, weakness (left arm) and numbness (left arm and left hand). The patient feels that they are doing poorly and report their pain level to be severe and 10 / 10. The following medication has been used for pain control: Hydrocodone (and Meloxicam, Baclofen, and Gabapentin).   Subjective Transcription  The patient is a 42 year old female with right neck pain and left greater than right upper extremity radiculopathy. 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 Moderate sized C4-5 disc extrusion with both cranial and caudal migration of the disc material. She continues to be symptomatic. Dr. Shon Baton previously discussed scheduling posterior fusion and this is scheduled for next month. She continues to describe constant pain and numbness into the left arm and hand. She also feels like her left arm is getting weak throughout. She has not had any NCV/EMG studies in the past.   Allergies IODINE. 03/07/2009 PENICILLIN. 03/07/2009 VICODIN. 03/07/2009    Social History No alcohol use Tobacco/Smoke Exposure Alcohol use. never consumed alcohol Children. 1 Current work status. unemployed Drug/Alcohol Rehab (Currently). no Drug/Alcohol Rehab (Previously). no Exercise. Exercises daily; does running / walking Illicit drug use. no Living situation. live alone Marital status. divorced Most recent primary occupation. Hair stylist Number of flights of stairs before winded. less than  1 Pain Contract. yes Previously in rehab. no Tobacco / smoke exposure. no Tobacco use. Never smoker. never smoker    Medication History Baclofen (20MG  Tablet, Oral) Active. Hydrocodone-Acetaminophen (5-325MG  Tablet, Oral) Active. Gabapentin (100MG  Capsule, Oral) Active. Meloxicam (7.5MG  Tablet, Oral) Active. Medications Reconciled.    Vitals 01/03/2013 9:17 AM Weight: 239 lb Height: 65 in Body Surface Area: 2.23 m Body Mass Index: 39.77 kg/m Temp.: 98.4 FPulse: 85 (Regular) BP: 119/90 (Sitting, Left Arm, Standard)     Objective Transcription  On exam very pleasant black female, alert and oriented times three, and in no acute distress. Temp 98.4, pulses 85, blood pressure 119/90, weight 239 pounds, height 65 inches, BMI 39.77. The patient is currently ambulating with a cane due to lumbar spine issues. Cervical spine decreased range of motion with pain and stiffness. Bilateral brachial plexus benign. Trapezius tenderness. Bilateral shoulder exam unremarkable. Negative impingement test. She does have obvious left biceps, triceps, and FCU weakness when compared to the right. positive left elbow flexion test, negative on the right. Negative Tinel's over the cubital and carpal tunnels. Positive left Phalen's. Skin normal and dry. Lungs clear bilaterally, no wheezing noted. Heart, no murmurs noted. Abdomen, round and obese.  CT myelogram shows a definite pseudoarthrosis at the 5-6 level. There is a moderate central sized disc protrusion at C4-5 but no direct canal compromise. There is only some mild to moderate central stenosis.    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    Plans Transcription  We will schedule the patient for bilateral upper extremity NCV/EMG studies to rule out peripheral nerve entrapment. Dr. Shon Baton  will contact her after we receive the results. For now  we will proceed with surgery as scheduled next month. Discussed procedure, potential rehab, recovery, and risks and complications. All questions encouraged and answered.  At this point we've had a long discussion about a posterior operation which I think provides the best chance at recovery. This would allow increase stability which can thereby promote the anterior fusion. In addition, she will need a bone stimulator after surgery. The risks include infection, bleeding, nerve damage, death, stroke, paralysis, failure to heal, need for further surgery, ongoing or worse pain. All of her questions were addressed.

## 2013-02-02 NOTE — Brief Op Note (Signed)
02/02/2013  9:55 AM  PATIENT:  [name]  42 y.o. 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  PROCEDURE:  Procedure(s): POSTERIOR C5-6 SPINAL FUSION (N/A)  SURGEON:  Surgeon(s) and Role:    * Venita Lick, MD - Primary  PHYSICIAN ASSISTANT:   ASSISTANTS: Zonia Kief   ANESTHESIA:   general  EBL:  Total I/O In: 1050 [I.V.:1050] Out: -   BLOOD ADMINISTERED:none  DRAINS: none   LOCAL MEDICATIONS USED:  MARCAINE     SPECIMEN:  No Specimen  DISPOSITION OF SPECIMEN:  N/A  COUNTS:  YES  TOURNIQUET:  * No tourniquets in log *  DICTATION: .Note written in EPIC and Other Dictation: Dictation Number [id number]  PLAN OF CARE: Admit for overnight observation  PATIENT DISPOSITION:  PACU - hemodynamically stable.

## 2013-02-02 NOTE — Anesthesia Preprocedure Evaluation (Signed)
Anesthesia Evaluation  Patient identified by MRN, date of birth, ID band Patient awake    Reviewed: Allergy & Precautions, H&P , NPO status , Patient's Chart, lab work & pertinent test results  Airway Mallampati: II TM Distance: <3 FB Neck ROM: Limited    Dental   Pulmonary  breath sounds clear to auscultation        Cardiovascular + Peripheral Vascular Disease Rhythm:Regular Rate:Normal     Neuro/Psych  Headaches,  Neuromuscular disease    GI/Hepatic   Endo/Other  Morbid obesity  Renal/GU      Musculoskeletal   Abdominal (+) + obese,   Peds  Hematology   Anesthesia Other Findings   Reproductive/Obstetrics                           Anesthesia Physical Anesthesia Plan  ASA: III  Anesthesia Plan: General   Post-op Pain Management:    Induction: Intravenous  Airway Management Planned: Oral ETT and Video Laryngoscope Planned  Additional Equipment:   Intra-op Plan:   Post-operative Plan: Extubation in OR  Informed Consent: I have reviewed the patients History and Physical, chart, labs and discussed the procedure including the risks, benefits and alternatives for the proposed anesthesia with the patient or authorized representative who has indicated his/her understanding and acceptance.     Plan Discussed with: CRNA and Surgeon  Anesthesia Plan Comments:         Anesthesia Quick Evaluation

## 2013-02-02 NOTE — Anesthesia Postprocedure Evaluation (Signed)
  Anesthesia Post-op Note  Patient: [name]  Procedure(s) Performed: Procedure(s): POSTERIOR C5-6 SPINAL FUSION (N/A)  Patient Location: PACU  Anesthesia Type:General  Level of Consciousness: awake and alert   Airway and Oxygen Therapy: Patient Spontanous Breathing  Post-op Pain: mild  Post-op Assessment: Post-op Vital signs reviewed, Patient's Cardiovascular Status Stable, Respiratory Function Stable, Patent Airway, No signs of Nausea or vomiting and Pain level controlled  Post-op Vital Signs: Reviewed and stable  Complications: No apparent anesthesia complications

## 2013-02-03 MED ORDER — ONDANSETRON 4 MG PO TBDP: 4.0000 mg | ORAL_TABLET | Freq: Three times a day (TID) | ORAL | Status: DC | PRN

## 2013-02-03 MED ORDER — ONDANSETRON HCL 4 MG PO TABS
4.0000 mg | ORAL_TABLET | Freq: Once | ORAL | Status: AC
  Administered 2013-02-03: 4 mg via ORAL
  Filled 2013-02-03: qty 1

## 2013-02-03 MED ORDER — POLYETHYLENE GLYCOL 3350 17 G PO PACK: 17.0000 g | PACK | Freq: Every day | ORAL | Status: DC

## 2013-02-03 MED ORDER — OXYCODONE-ACETAMINOPHEN 10-325 MG PO TABS: 1.0000 | ORAL_TABLET | ORAL | Status: DC | PRN

## 2013-02-03 MED ORDER — OXYCODONE HCL ER 15 MG PO T12A
15.0000 mg | EXTENDED_RELEASE_TABLET | Freq: Two times a day (BID) | ORAL | Status: DC
  Administered 2013-02-03: 15 mg via ORAL
  Filled 2013-02-03: qty 1

## 2013-02-03 MED ORDER — DOCUSATE SODIUM 100 MG PO CAPS: 100.0000 mg | ORAL_CAPSULE | Freq: Two times a day (BID) | ORAL | Status: DC

## 2013-02-03 MED ORDER — FLUCONAZOLE 150 MG PO TABS
150.0000 mg | ORAL_TABLET | Freq: Once | ORAL | Status: AC
  Administered 2013-02-03: 150 mg via ORAL
  Filled 2013-02-03: qty 1

## 2013-02-03 MED ORDER — OXYCODONE HCL ER 15 MG PO T12A: 15.0000 mg | EXTENDED_RELEASE_TABLET | Freq: Two times a day (BID) | ORAL | Status: DC

## 2013-02-03 MED ORDER — METHOCARBAMOL 500 MG PO TABS: 500.0000 mg | ORAL_TABLET | Freq: Four times a day (QID) | ORAL | Status: DC | PRN

## 2013-02-03 NOTE — Evaluation (Addendum)
Occupational Therapy Evaluation and Discharge Patient Details Name: [name] MRN: [id number] DOB: [birth year] Today's Date: 02/03/2013 Time: 0821-0901 OT Time Calculation (min): 40 min  OT Assessment / Plan / Recommendation History of present illness POSTERIOR C5-6 SPINAL FUSION    Clinical Impression   This 42 yo female admitted and underwent above presents to acute OT with all education completed and she will have her mom there to A her as well if needed. D/C from acute OT.    OT Assessment  Patient does not need any further OT services    Follow Up Recommendations  No OT follow up       Equipment Recommendations  None recommended by OT          Precautions / Restrictions Precautions Precautions: Cervical Precaution Booklet Issued: Yes (comment) Required Braces or Orthoses: Cervical Brace Cervical Brace: Hard collar;At all times (except off for eating with neck supported) Restrictions Weight Bearing Restrictions: No   Pertinent Vitals/Pain 10/10; repositioned and made RN aware    ADL  Equipment Used: Gait belt;Rolling walker (neck brace) Transfers/Ambulation Related to ADLs: min guard for all with RW ADL Comments: Pt crosses legs to get to feet. Pt's mother said that they can borrow a 3n1 and tub seat. I recommended that they get a hand held shower as well. Pt over all is a setup/S for BADLs     Acute Rehab OT Goals Patient Stated Goal: Home today  Visit Information  Last OT Received On: 02/03/13 Assistance Needed: +1 History of Present Illness: POSTERIOR C5-6 SPINAL FUSION        Prior Functioning     Home Living Family/patient expects to be discharged to:: Private residence Living Arrangements: Parent Available Help at Discharge: Family;Available 24 hours/day Type of Home: House Home Access: Stairs to enter [employer] of Steps: 3 and then 1 into house Entrance Stairs-Rails: Left Home Layout: One level Home Equipment: Walker -  standard;Bedside commode;Shower seat;Cane - single point;Grab bars - tub/shower Prior Function Level of Independence: Independent Communication Communication: No difficulties Dominant Hand: Right         Vision/Perception Vision - History Patient Visual Report: No change from baseline   Cognition  Cognition Arousal/Alertness: Awake/alert Behavior During Therapy: WFL for tasks assessed/performed Overall Cognitive Status: Within Functional Limits for tasks assessed    Extremity/Trunk Assessment Upper Extremity Assessment Upper Extremity Assessment: Overall WFL for tasks assessed (pt made aware she should not raise her arms above shoulder height)     Mobility Bed Mobility Bed Mobility: Supine to Sit;Sitting - Scoot to Edge of Bed Supine to Sit: 5: Supervision;With rails;HOB elevated Sitting - Scoot to Edge of Bed: 5: Supervision;With rail Details for Bed Mobility Assistance: Pt educated that it may be easier on her intially to sleep in recliner; pt is aware from prior back surgery how to log roll to get in and OOB Transfers Transfers: Sit to Stand;Stand to Sit Sit to Stand: 4: Min guard;With upper extremity assist;From bed Stand to Sit: 4: Min guard;With upper extremity assist;With armrests;To chair/3-in-1 Details for Transfer Assistance: VCs for safe hand placement           End of Session OT - End of Session Equipment Utilized During Treatment: Gait belt;Rolling walker;Cervical collar Activity Tolerance: Patient tolerated treatment well Patient left: in chair;with call bell/phone within reach;with family/visitor present Nurse Communication: Patient requests pain meds (called up to desk to let them know that pt needed an extra set of pads for Vista/Aspen collar)  Functional Assessment Tool Used: clincal observation (clincal observation) Functional Limitation: Self care Self Care Current Status (Z6109): At least 1 percent but less than 20 percent impaired, limited or  restricted Self Care Goal Status (U0454): At least 1 percent but less than 20 percent impaired, limited or restricted Self Care Discharge Status [phone]): At least 1 percent but less than 20 percent impaired, limited or restricted   Evette Georges [phone] 02/03/2013, 10:44 AM

## 2013-02-03 NOTE — Progress Notes (Signed)
Pt d/c to home, IV removed, prescriptions given and instructions reviewed.

## 2013-02-03 NOTE — Progress Notes (Signed)
02/03/13 PT recommending rolling walker and 3N1. Contacted Darian with [facility], rolling walker and 3N1 to be delivered to patient's room prior to discharge. Jacquelynn Cree RN, BSN, CCM

## 2013-02-03 NOTE — Evaluation (Signed)
Physical Therapy Evaluation Patient Details Name: [name] MRN: [id number] DOB: [birth year] Today's Date: 02/03/2013 Time: 4098-1191 PT Time Calculation (min): 22 min  PT Assessment / Plan / Recommendation History of Present Illness  Posterior C5-C6 spinal fusion  Clinical Impression  This patient presents with acute pain and decreased functional independence following the above mentioned procedure. At the time of PT eval, pt was very shaky and required use of RW for stability. Fatigues easily, and ambulated ~30 feet in room. This patient is appropriate for skilled PT interventions to address functional limitations, improve safety and independence with functional mobility, and return to PLOF.    PT Assessment  Patient needs continued PT services    Follow Up Recommendations  Outpatient PT    Does the patient have the potential to tolerate intense rehabilitation      Barriers to Discharge        Equipment Recommendations  Rolling walker with 5" wheels;3in1 (PT)    Recommendations for Other Services     Frequency Min 5X/week    Precautions / Restrictions Precautions Precautions: Cervical;Fall Precaution Booklet Issued: Yes (comment) (By OT) Required Braces or Orthoses: Cervical Brace Cervical Brace: Hard collar;At all times (Except off for eating with neck supported) Restrictions Weight Bearing Restrictions: No   Pertinent Vitals/Pain 9/10 pain at rest. Received pain medication at beginning of session.      Mobility  Bed Mobility Bed Mobility: Supine to Sit;Sitting - Scoot to [place] of Bed;Sit to Supine;Scooting to [facility] Supine to Sit: 5: Supervision;With rails;HOB elevated Sitting - Scoot to Edge of Bed: 5: Supervision;With rail Sit to Supine: 5: Supervision;HOB elevated;With rail Scooting to [facility]: 5: Supervision Details for Bed Mobility Assistance: HOB elevated dramatically when pt transitioned to/from EOB. She reports that she knows how to perform log roll  technique from previous back surgery. Transfers Transfers: Sit to Stand;Stand to Sit Sit to Stand: 4: Min guard;With upper extremity assist;From bed Stand to Sit: 4: Min guard;To bed;With upper extremity assist Details for Transfer Assistance: Pt demonstrated proper hand placement and safety awareness. Ambulation/Gait Ambulation/Gait Assistance: 4: Min guard Ambulation Distance (Feet): 30 Feet Assistive device: Rolling walker Ambulation/Gait Assistance Details: Pt very shaky and muscles were trembling with work load of using walker and ambulating. This improved over time, and pt did not have any LOB. Gait Pattern: Step-through pattern;Decreased stride length Gait velocity: Decreased Stairs: No (Pt refused stair training at this time) Wheelchair Mobility Wheelchair Mobility: No    Exercises     PT Diagnosis: Acute pain  PT Problem List: Decreased strength;Decreased range of motion;Decreased activity tolerance;Decreased balance;Decreased knowledge of use of DME;Decreased safety awareness;Pain PT Treatment Interventions: DME instruction;Gait training;Stair training;Functional mobility training;Therapeutic activities;Therapeutic exercise;Neuromuscular re-education;Patient/family education     PT Goals(Current goals can be found in the care plan section) Acute Rehab PT Goals Patient Stated Goal: Home today PT Goal Formulation: With patient/family Time For Goal Achievement: 02/10/13 Potential to Achieve Goals: Good  Visit Information  Last PT Received On: 02/03/13 Assistance Needed: +1 History of Present Illness: Posterior C5-C6 spinal fusion       Prior Functioning  Home Living Family/patient expects to be discharged to:: Private residence Living Arrangements: Children;Parent Available Help at Discharge: Family;Available 24 hours/day Type of Home: House Home Access: Stairs to enter [employer] of Steps: 3 total Entrance Stairs-Rails: Right Home Layout: One  level Home Equipment: Cane - single point Prior Function Level of Independence: Independent Comments: Had difficulties due to back surgeries but was able to do  ADL's on her own Communication Communication: No difficulties Dominant Hand: Right    Cognition  Cognition Arousal/Alertness: Awake/alert Behavior During Therapy: WFL for tasks assessed/performed Overall Cognitive Status: Within Functional Limits for tasks assessed    Extremity/Trunk Assessment Upper Extremity Assessment Upper Extremity Assessment: Defer to OT evaluation Lower Extremity Assessment Lower Extremity Assessment: Overall WFL for tasks assessed   Balance Balance Balance Assessed: Yes Static Sitting Balance Static Sitting - Balance Support: Feet supported;Bilateral upper extremity supported Static Sitting - Level of Assistance: 5: Stand by assistance Static Standing Balance Static Standing - Balance Support: Bilateral upper extremity supported Static Standing - Level of Assistance: 5: Stand by assistance  End of Session PT - End of Session Equipment Utilized During Treatment: Gait belt;Cervical collar Activity Tolerance: Patient limited by fatigue Patient left: in bed;with call bell/phone within reach;with family/visitor present Nurse Communication: Mobility status  GP Functional Assessment Tool Used: Clinical Judgement Functional Limitation: Mobility: Walking and moving around Mobility: Walking and Moving Around Current Status [phone]): At least 20 percent but less than 40 percent impaired, limited or restricted Mobility: Walking and Moving Around Goal Status [phone]): At least 20 percent but less than 40 percent impaired, limited or restricted Mobility: Walking and Moving Around Discharge Status [phone]): At least 20 percent but less than 40 percent impaired, limited or restricted   Ruthann Cancer 02/03/2013, 2:03 PM  Ruthann Cancer, PT, DPT [phone]

## 2013-02-03 NOTE — Progress Notes (Signed)
    Subjective: Procedure(s) (LRB): POSTERIOR C5-6 SPINAL FUSION (N/A) 1 Day Post-Op  Patient reports pain as 4 on 0-10 scale.  Reports deferred at this time arm pain reports incisional neck pain   Positive void Negative bowel movement Positive flatus Negative chest pain or shortness of breath  Objective: Vital signs in last 24 hours: Temp:  [97.4 F (36.3 C)-98.5 F (36.9 C)] 98.5 F (36.9 C) (11/14 0411) Pulse Rate:  [71-99] 77 (11/14 0411) Resp:  [13-20] 18 (11/14 0411) BP: (123-152)/(64-90) 134/65 mmHg (11/14 0411) SpO2:  [97 %-100 %] 97 % (11/14 0411) Weight:  [108.5 kg (239 lb 3.2 oz)] 108.5 kg (239 lb 3.2 oz) (11/13 1300)  Intake/Output from previous day: 11/13 0701 - 11/14 0700 In: 2325 [P.O.:480; I.V.:1745; IV Piggyback:100] Out: 200 [Urine:200]  Labs: No results found for this basename: WBC, RBC, HCT, PLT,  in the last 72 hours No results found for this basename: NA, K, CL, CO2, BUN, CREATININE, GLUCOSE, CALCIUM,  in the last 72 hours No results found for this basename: LABPT, INR,  in the last 72 hours  Physical Exam: Neurologically intact ABD soft Intact pulses distally Incision: dressing C/D/I and no drainage Compartment soft  Assessment/Plan: Patient stable  xrays satisfactory Mobilization with physical therapy Encourage incentive spirometry Continue care  Advance diet Up with therapy Add oxycontin for pain control D/C if pain better controlled with oxycontin.  Venita Lick, MD [facility] [phone]

## 2013-02-03 NOTE — Op Note (Signed)
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  OPERATIVE PROCEDURE:  POSTERIOR LATERAL MASS SCREW FIXATION WITH LOCAL BONE AND ALLOGRAFT CONTOUR BONE.  COMPLICATIONS:  None.  CONDITION:  Stable.  INSTRUMENTATION SYSTEM USED:  DePuy Mountaineer lateral mass screw fixation devices with 14-mm Favored angle screws at C5 and C6.  No intraoperative complications.  HISTORY:  This is a very pleasant woman, who had an ACDF in the past. 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  The nonunion became painful.  We could not control her discomfort with conservative measures.  As a result, we elected to proceed with a posterior based fusion.  All appropriate risks, benefits, and alternatives to surgery were discussed with the patient and consent was obtained.  OPERATIVE NOTE:  The patient was brought to the operating room, placed supine on the operating table.  After successful induction of general anesthesia and endotracheal intubation, TEDs, SCDs were applied.  She was turned prone onto the spine frame.  The arms were taped at the side and the shoulders were taped down.  All bony prominences were padded and the posterior cervical spine was prepped and draped in a standard fashion.  Time-out was taken to confirm patient, procedure, and all other pertinent important data.  I infiltrated 0.25% Marcaine with epi and a midline incision was made.  Sharp dissection was carried out down to the deep fascia.  Deep fascia was sharply incised in the midline and then I began stripping the  paraspinal muscles to expose the C6 and C5 spinous processes, lamina and facet joints.  I continued my dissection out until I was just beyond the lateral border of the facet complex at C5-6.  I then took an intraoperative fluoro view to confirm that I was at the appropriate level.  I then exposed the contralateral side.  At this point, I then used a double-action Leksell rongeur to remove the spinous processes of C5 and C6 and a portion of C7.  I then used a high- speed burr to decorticate the remaining portion of spinous process lamina and facet.  I also burred the facet joint itself.  I then proceeded with the lateral mass screw fixation.  I placed an awl in the inferior medial quadrant of the facet of C5.  I then used an awl to create a starting spot.  I then placed a drill aiming it cranial and lateral.  I then drilled into the lateral mass total depth of 14 mm screws.  I confirmed trajectory in position with x-ray.  I then used a ball tipped Feeler to feel the holes and then tapped and then refilled the hole.  I had a solid bony canal.  There was no breach.  I then placed the appropriate size  lateral mass  screw.  I repeated this in C6 and on the contralateral side.  Once I had the screws in place, I then placed my contour sheet underneath the screws and then screwed them down to their final resting place.  I then used the local bone that I had harvested from removing the spinous process and packed that into the facet joint and along the lamina.  At this point, I had excellent fixation and positioning of the bone graft.  I then contoured 2 rods and secured into the lateral mass with the locking nut.  All screws were torqued down appropriately.  I then irrigated the wound copiously with normal saline and obtained hemostasis using Floseal as well as bipolar electrocautery.  Final x-rays were satisfactory.  Hardware and graft were in good position.  I then closed the deep fascia with  an interrupted #1 Vicryl suture, and then a layer of 2-0 Vicryl sutures and vertical mattress PDS stitch for the skin.  Steri-Strips and dry dressing were applied.  The patient was ultimately extubated and transferred to the PACU without incident.  At the end of the case, all needle and sponge counts were correct.  First assistant was Alesia Richards, my PA.     Alvy Beal, MD     DDB/MEDQ  D:  02/02/2013  T:  02/03/2013  Job:  [id number]

## 2013-02-03 NOTE — Progress Notes (Signed)
   CARE MANAGEMENT NOTE 02/03/2013  Patient:  [name], [name]   Account Number:  [id number]  Date Initiated:  02/03/2013  Documentation initiated by:  [facility]  Subjective/Objective Assessment:   POSTERIOR C5-6 SPINAL FUSION     Action/Plan:   No HH OT recommended   Anticipated DC Date:  02/03/2013   Anticipated DC Plan:  HOME/SELF CARE      DC Planning Services  CM consult      Choice offered to / List presented to:             Status of service:  Completed, signed off Medicare Important Message given?   (If response is "NO", the following Medicare IM given date fields will be blank) Date Medicare IM given:   Date Additional Medicare IM given:    Discharge Disposition:  HOME/SELF CARE  Per UR Regulation:    If discussed at Long Length of Stay Meetings, dates discussed:    Comments:  No NCM needs identified. Isidoro Donning RN CCM Case Mgmt phone [phone]

## 2013-02-06 ENCOUNTER — Encounter (HOSPITAL_COMMUNITY): Payer: Self-pay | Admitting: Orthopedic Surgery

## 2013-02-21 ENCOUNTER — Encounter
Payer: Medicaid Other | Attending: Physical Medicine and Rehabilitation | Admitting: Physical Medicine and Rehabilitation

## 2013-02-21 ENCOUNTER — Encounter: Payer: Self-pay | Admitting: Physical Medicine and Rehabilitation

## 2013-02-21 VITALS — BP 124/64 | HR 80 | Resp 14 | Ht 65.0 in | Wt 243.0 lb

## 2013-02-21 DIAGNOSIS — G589 Mononeuropathy, unspecified: Secondary | ICD-10-CM | POA: Insufficient documentation

## 2013-02-21 DIAGNOSIS — G834 Cauda equina syndrome: Secondary | ICD-10-CM | POA: Insufficient documentation

## 2013-02-21 DIAGNOSIS — M51379 Other intervertebral disc degeneration, lumbosacral region without mention of lumbar back pain or lower extremity pain: Secondary | ICD-10-CM | POA: Insufficient documentation

## 2013-02-21 DIAGNOSIS — M48061 Spinal stenosis, lumbar region without neurogenic claudication: Secondary | ICD-10-CM

## 2013-02-21 DIAGNOSIS — M545 Low back pain, unspecified: Secondary | ICD-10-CM | POA: Insufficient documentation

## 2013-02-21 DIAGNOSIS — M961 Postlaminectomy syndrome, not elsewhere classified: Secondary | ICD-10-CM

## 2013-02-21 DIAGNOSIS — M542 Cervicalgia: Secondary | ICD-10-CM | POA: Insufficient documentation

## 2013-02-21 DIAGNOSIS — M5137 Other intervertebral disc degeneration, lumbosacral region: Secondary | ICD-10-CM | POA: Insufficient documentation

## 2013-02-21 DIAGNOSIS — Z981 Arthrodesis status: Secondary | ICD-10-CM | POA: Insufficient documentation

## 2013-02-21 MED ORDER — GABAPENTIN 300 MG PO CAPS: 300.0000 mg | ORAL_CAPSULE | Freq: Three times a day (TID) | ORAL | Status: DC

## 2013-02-21 NOTE — Progress Notes (Signed)
Subjective:    Patient ID: [name], female    DOB: Mar 27, 1970, 42 y.o.   MRN: [id number]  HPI [name] is back regarding her low back pain, Hx of cauda equina s. and Laminectomy, June 2013. Her biggest complaint is muscle and joint pain in her back and legs.  The spasms are not bothering her that much anymore. She also has consistent tingling in her leg. 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 She applied for disability, which was denied , she has re-applied . She is walking with a cane today. Her problem, concerning her lower back has been stable since last visit. She reports that she had a posterior fusion at C5-6 done on 02/02/13, by Dr. Shon Baton, she had loosening of the pedicle screws from a former ACDF at this level. Dr. Shon Baton is prescribing Oxycontin 15mg  q 12 hrs, and Oxycodone 10/325mg  1-2 tablets q 4 hrs.    Pain Inventory Average Pain 8 Pain Right Now 8 My pain is sharp, burning, dull, stabbing, tingling and aching  In the last 24 hours, has pain interfered with the following? General activity 7 Relation with others 10 Enjoyment of life 8 What TIME of day is your pain at its worst? all Sleep (in general) Poor  Pain is worse with: walking, bending, sitting and standing Pain improves with: medication Relief from Meds: 6  Mobility walk without assistance walk with assistance use a cane use a walker do you drive?  no  Function not employed: date last employed . I need assistance with the following:  household duties and shopping  Neuro/Psych weakness numbness tingling trouble walking spasms  Prior Studies Any changes since last visit?  yes  Surgery with Dr Shon Baton on neck  Physicians involved in your care Orthopedist Dr Shon Baton   Family History  Problem Relation Age of Onset  . Anesthesia problems Neg Hx   . Hypertension Maternal Grandmother    History   Social History  . Marital Status:  Divorced    Spouse Name: N/A    Number of Children: N/A  . Years of Education: N/A   Social History Main Topics  . Smoking status: Never Smoker   . Smokeless tobacco: Never Used  . Alcohol Use: No  . Drug Use: No  . Sexual Activity: Not Currently    Birth Control/ Protection: None     Comment: IUD removed 2 wks ago.   Other Topics Concern  . None   Social History Narrative  . None   Past Surgical History  Procedure Laterality Date  . Neck surgery    . Cervical spine surgery  10  . Myomectomy  13  . Wisdom tooth extraction    . Cesarean section  1996  . Abdominal hysterectomy  06/29/11    Robotic Assisted Hysterectomy  . Lumbar laminectomy  08/31/2011    Procedure: MICRODISCECTOMY LUMBAR LAMINECTOMY;  Surgeon: Jacki Cones, MD;  Location: [facility];  Service: Orthopedics;  Laterality: N/A;  lumbar five sacral one central microdisectomy  . Posterior fusion cervical spine  02/02/2013    C 5 C6     Dr Shon Baton  . Posterior cervical fusion/foraminotomy N/A 02/02/2013    Procedure: POSTERIOR C5-6 SPINAL FUSION;  Surgeon: Venita Lick, MD;  Location: [facility] OR;  Service: Orthopedics;  Laterality: N/A;   Past Medical History  Diagnosis Date  . DVT (deep venous thrombosis)     ?  vs lupus  . Fibroids   . Headache(784.0)   . Difficult intravenous access     hands are usually best for blood draws and IV starts  . Neck pain   . Arthritis   . Anemia     hx  . Complication of anesthesia     slow to wake up,   very difficult to get iv had picc line last time  . Scoliosis    BP 124/64  Pulse 80  Resp 14  Ht 5\' 5"  (1.651 m)  Wt 243 lb (110.224 kg)  BMI 40.44 kg/m2  SpO2 100%  LMP 02/27/2011   Review of Systems  Constitutional: Positive for diaphoresis and unexpected weight change.  Gastrointestinal: Positive for nausea, vomiting and constipation.  Musculoskeletal: Positive for gait problem and neck pain.       Spasm  Neurological: Positive for weakness and numbness.        Tingling  All other systems reviewed and are negative.       Objective:   Physical Exam General: Alert and oriented x 3. She remains obese.  HEENT: Head is normocephalic, atraumatic  Neck: Supple without JVD or lymphadenopathy  Extremities: No clubbing, cyanosis, or edema. Pulses are 2+  Skin: Clean and intact without signs of breakdown- woiund clean and intact.  Neuro: Pt is cognitively appropriate with normal insight, memory, and awareness. Cranial nerves 2-12 are intact. Reflexes are 2+ in LE's. Fine motor coordination is intact. No tremors. Strength in the RLE 4/5. LLE 5/5 .  Musculoskeletal:  Posture is fair. She has pain with palpation over the lower lumbar spine and paraspinals. Walks with a cane,  Psych: Pt's affect is appropriate. Pt is cooperative  Wears an Designer, industrial/product & Plan:  1. 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 Status post decompressive laminectomy , recommended to follow up with her spine surgeon to evaluate her healing process, which she did, and Dr. Wynelle Cleveland told her that the x-rays of her L-spine looked fine, per patient.  2. 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  3. Pain Management: patient gets pain medication from surgeon at this point 4. Neuropathic pain. Continue neurontin but increase to 300mg  tid, titration schedule provided  Patient is doing a little better with her current medication at this point.But she is still not able to stand or sit in one position for a prolonged time.  7.Neck pain, radiating to the left UE, Hx of ACDF.Dr. Shon Baton ordered a CT-myelogram, which showed   C5-C6: Findings compatible with pseudoarthrosis of C5-C6. There  is loosening of the C5 ACDF screws and sclerosis along the C5-C6  disc space.  She reports that she had a posterior fusion at C5-6 done on 02/02/13, by Dr. Shon Baton, she had loosening of the pedicle screws from a former  ACDF at this level. Dr. Shon Baton is prescribing Oxycontin 15mg  q 12 hrs, and Oxycodone 10/325mg  1-2 tablets q 4 hrs, and Robaxin 500mg  qid. The patient states, that Dr. Shon Baton will continue to prescribe her pain medication for a while. Follow up in 3 month, or earlier, if Dr. Shon Baton does not prescribe her narcotics anymore.

## 2013-02-21 NOTE — Patient Instructions (Signed)
Follow the instructions of your surgeon

## 2013-03-14 ENCOUNTER — Encounter (HOSPITAL_COMMUNITY): Payer: Self-pay | Admitting: Emergency Medicine

## 2013-03-14 ENCOUNTER — Emergency Department (INDEPENDENT_AMBULATORY_CARE_PROVIDER_SITE_OTHER): Payer: Medicaid Other

## 2013-03-14 ENCOUNTER — Emergency Department (HOSPITAL_COMMUNITY)
Admission: EM | Admit: 2013-03-14 | Discharge: 2013-03-14 | Disposition: A | Discharge status: Home / Self Care | Payer: Medicaid Other | Source: Home / Self Care

## 2013-03-14 DIAGNOSIS — K5900 Constipation, unspecified: Secondary | ICD-10-CM

## 2013-03-14 DIAGNOSIS — R109 Unspecified abdominal pain: Secondary | ICD-10-CM

## 2013-03-14 NOTE — ED Provider Notes (Signed)
Medical screening examination/treatment/procedure(s) were performed by resident physician or non-physician practitioner and as supervising physician I was immediately available for consultation/collaboration.   Iain Sawchuk DOUGLAS MD.   Harlym Gehling D Jonael Paradiso, MD 03/14/13 1424 

## 2013-03-14 NOTE — ED Notes (Signed)
C/o BRB from rectal area and upper abdominal area; on narcotic pain meds post ortho surgery

## 2013-03-14 NOTE — ED Provider Notes (Signed)
CSN: [id number]     Arrival date & time 03/14/13  1007 History   First MD Initiated Contact with Patient 03/14/13 1112     Chief Complaint  Patient presents with  . Constipation   (Consider location/radiation/quality/duration/timing/severity/associated sxs/prior Treatment) HPI Comments: Spinal surgery last month. Today f/u with orthopedist and pt mentioned she was having abdominal pain with burning in epigastrium. Was told to go to the urgent care. Taking daily narcotics for pain. Last good BM was before the surgery.   Past Medical History  Diagnosis Date  . DVT (deep venous thrombosis)     ? vs lupus  . Fibroids   . Headache(784.0)   . Difficult intravenous access     hands are usually best for blood draws and IV starts  . Neck pain   . Arthritis   . Anemia     hx  . Complication of anesthesia     slow to wake up,   very difficult to get iv had picc line last time  . Scoliosis    Past Surgical History  Procedure Laterality Date  . Neck surgery    . Cervical spine surgery  10  . Myomectomy  13  . Wisdom tooth extraction    . Cesarean section  1996  . Abdominal hysterectomy  06/29/11    Robotic Assisted Hysterectomy  . Lumbar laminectomy  08/31/2011    Procedure: MICRODISCECTOMY LUMBAR LAMINECTOMY;  Surgeon: Jacki Cones, MD;  Location: [facility];  Service: Orthopedics;  Laterality: N/A;  lumbar five sacral one central microdisectomy  . Posterior fusion cervical spine  02/02/2013    C 5 C6     Dr Shon Baton  . Posterior cervical fusion/foraminotomy N/A 02/02/2013    Procedure: POSTERIOR C5-6 SPINAL FUSION;  Surgeon: Venita Lick, MD;  Location: [facility];  Service: Orthopedics;  Laterality: N/A;   Family History  Problem Relation Age of Onset  . Anesthesia problems Neg Hx   . Hypertension Maternal Grandmother    History  Substance Use Topics  . Smoking status: Never Smoker   . Smokeless tobacco: Never Used  . Alcohol Use: No   OB History   Grav Para Term Preterm  Abortions TAB SAB Ect Mult Living   2 1 1  0 1 0 1 0 0 1     Review of Systems  Constitutional: Negative.   Respiratory: Negative.   Cardiovascular: Negative.   Gastrointestinal: Positive for nausea, abdominal pain, constipation and anal bleeding. Negative for vomiting, diarrhea, abdominal distention and rectal pain.  Genitourinary: Negative.   Skin: Negative.   Neurological: Negative.     Allergies  Penicillins; Shrimp; Contrast media; and Meperidine and related  Home Medications   Current Outpatient Rx  Name  Route  Sig  Dispense  Refill  . cholecalciferol (VITAMIN D) 1000 UNITS tablet   Oral   Take 2,000 Units by mouth every morning.          . docusate sodium (COLACE) 100 MG capsule   Oral   Take 100 mg by mouth daily.         Marland Kitchen docusate sodium (COLACE) 100 MG capsule   Oral   Take 1 capsule (100 mg total) by mouth 2 (two) times daily.   40 capsule   0   . gabapentin (NEURONTIN) 300 MG capsule   Oral   Take 1 capsule (300 mg total) by mouth 3 (three) times daily.   90 capsule   3   . hydrocortisone (ANUSOL-HC) 25  MG suppository   Rectal   Place 1 suppository (25 mg total) rectally 2 (two) times daily.   14 suppository   0   . methocarbamol (ROBAXIN) 500 MG tablet   Oral   Take 1 tablet (500 mg total) by mouth every 6 (six) hours as needed for muscle spasms.   60 tablet   0   . ondansetron (ZOFRAN ODT) 4 MG disintegrating tablet   Oral   Take 1 tablet (4 mg total) by mouth every 8 (eight) hours as needed for nausea or vomiting.   60 tablet   0   . OxyCODONE (OXYCONTIN) 15 mg T12A 12 hr tablet   Oral   Take 1 tablet (15 mg total) by mouth every 12 (twelve) hours.   40 tablet   0   . oxyCODONE-acetaminophen (PERCOCET) 10-325 MG per tablet   Oral   Take 1-2 tablets by mouth every 4 (four) hours as needed for pain.   60 tablet   0   . polyethylene glycol (MIRALAX / GLYCOLAX) packet   Oral   Take 17 g by mouth daily.   24 each   0    BP  111/79  Pulse 84  Temp(Src) 98 F (36.7 C) (Oral)  Resp 16  SpO2 100%  LMP 02/27/2011 Physical Exam  Nursing note and vitals reviewed. Constitutional: She is oriented to person, place, and time. She appears well-developed.  Moderate obesity.  Eyes: Conjunctivae and EOM are normal.  Neck: Normal range of motion. Neck supple.  Cardiovascular: Normal rate, regular rhythm and normal heart sounds.   Pulmonary/Chest: Effort normal and breath sounds normal. No respiratory distress.  Abdominal: Soft. She exhibits no distension and no mass. There is tenderness. There is no rebound and no guarding.  Mild Tenderness in epigastrium and L hemiabdomen  Musculoskeletal:  Tenderness spine  Neurological: She is alert and oriented to person, place, and time.  Skin: Skin is warm and dry.  Psychiatric: She has a normal mood and affect.    ED Course  Procedures (including critical care time) Labs Review Labs Reviewed  OCCULT BLOOD X 1 CARD TO LAB, STOOL   Imaging Review Dg Abd 1 View  03/14/2013   CLINICAL DATA:  Abdominal pain  EXAM: ABDOMEN - 1 VIEW  COMPARISON:  None.  FINDINGS: The bowel gas pattern is normal. No radio-opaque calculi or other significant radiographic abnormality are seen.  IMPRESSION: Negative.   Electronically Signed   By: Salome Holmes M.D.   On: 03/14/2013 11:48      MDM   1. Abdominal  pain, other specified site   2. Constipation     When you get home to take for Dulcolax tablets and started drinking 3 full glasses of liquids with a capful of MiraLax each. Administer a mineral fleets enema. Followup with your PCP.   Hayden Rasmussen, NP 03/14/13 [phone]

## 2013-04-03 IMAGING — CR DG ABDOMEN ACUTE W/ 1V CHEST
3 series · 3 of 3 positions shown · non-contrast
Comparison: Chest x-ray of 04/05/2011 and lumbar spine film of
07/09/2011

CLINICAL DATA: Abdominal pain, shortness of breath

ACUTE ABDOMEN SERIES (ABDOMEN 2 VIEW & CHEST 1 VIEW)

[w abdomen decub]
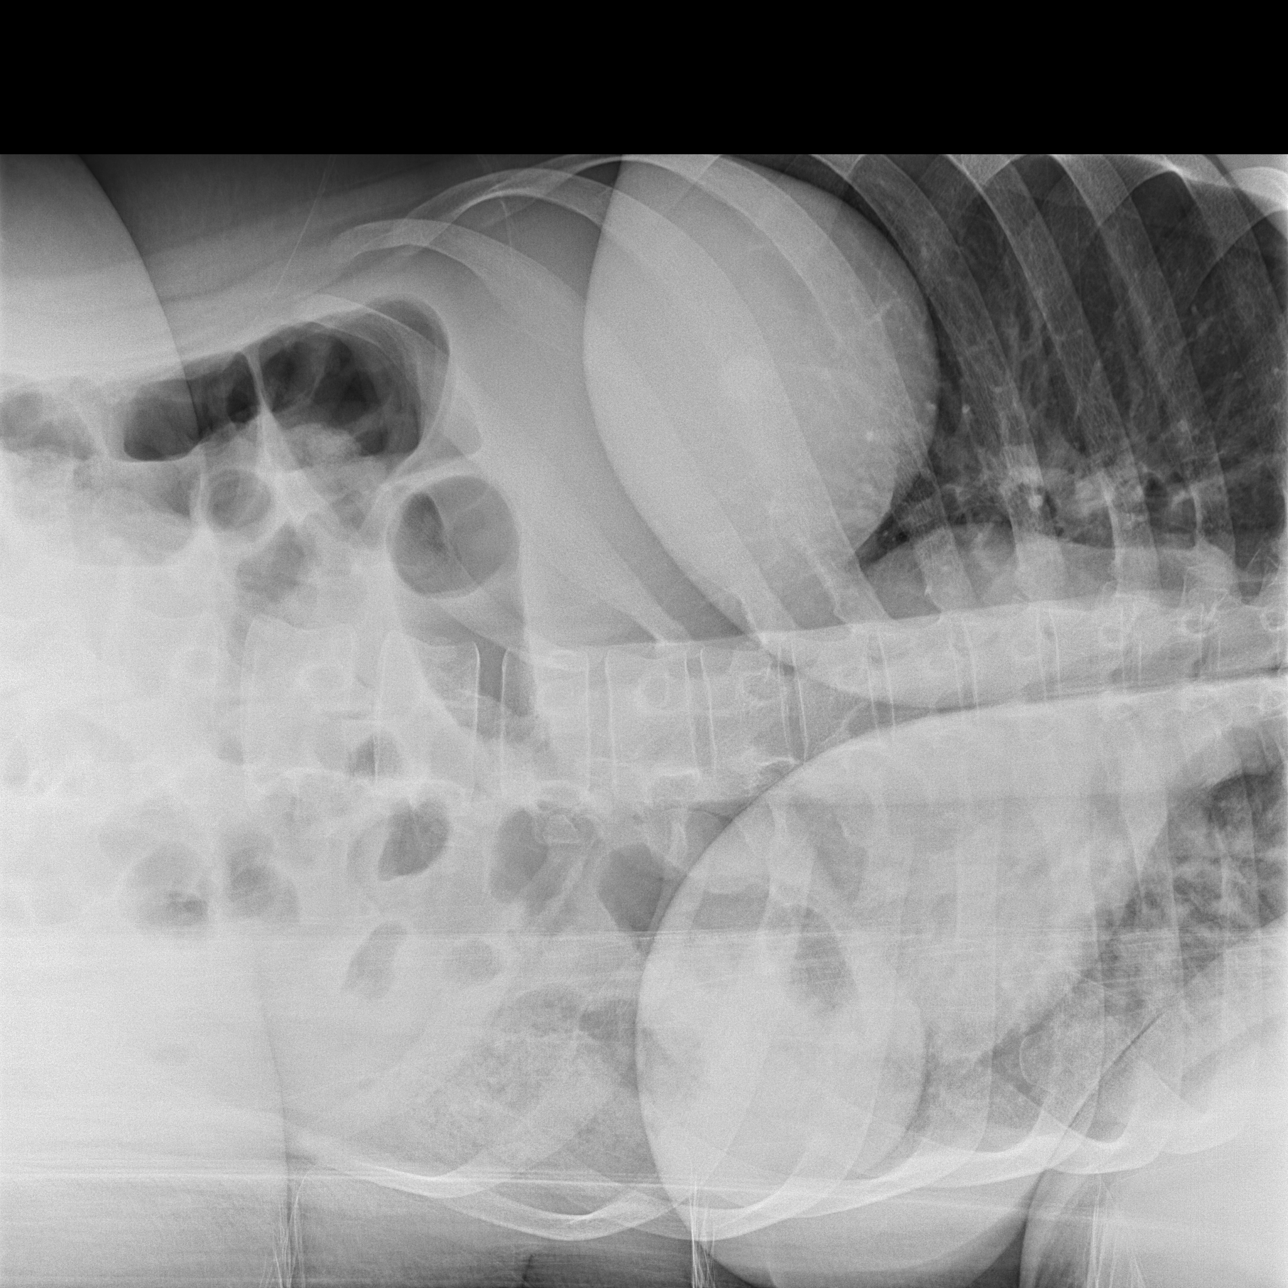

[x abdomen supine]
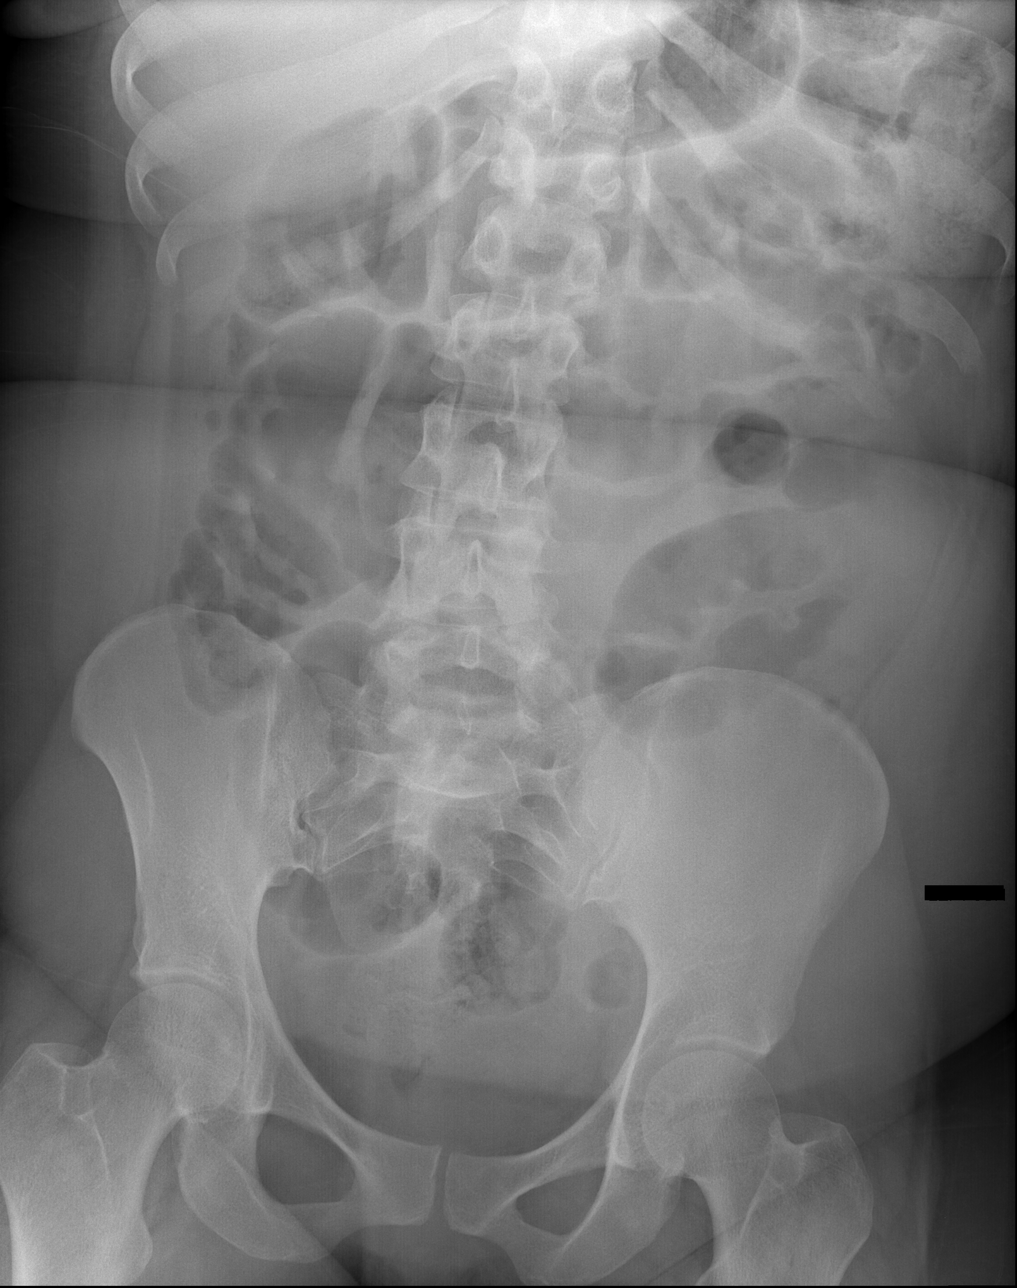

[x chest ap]
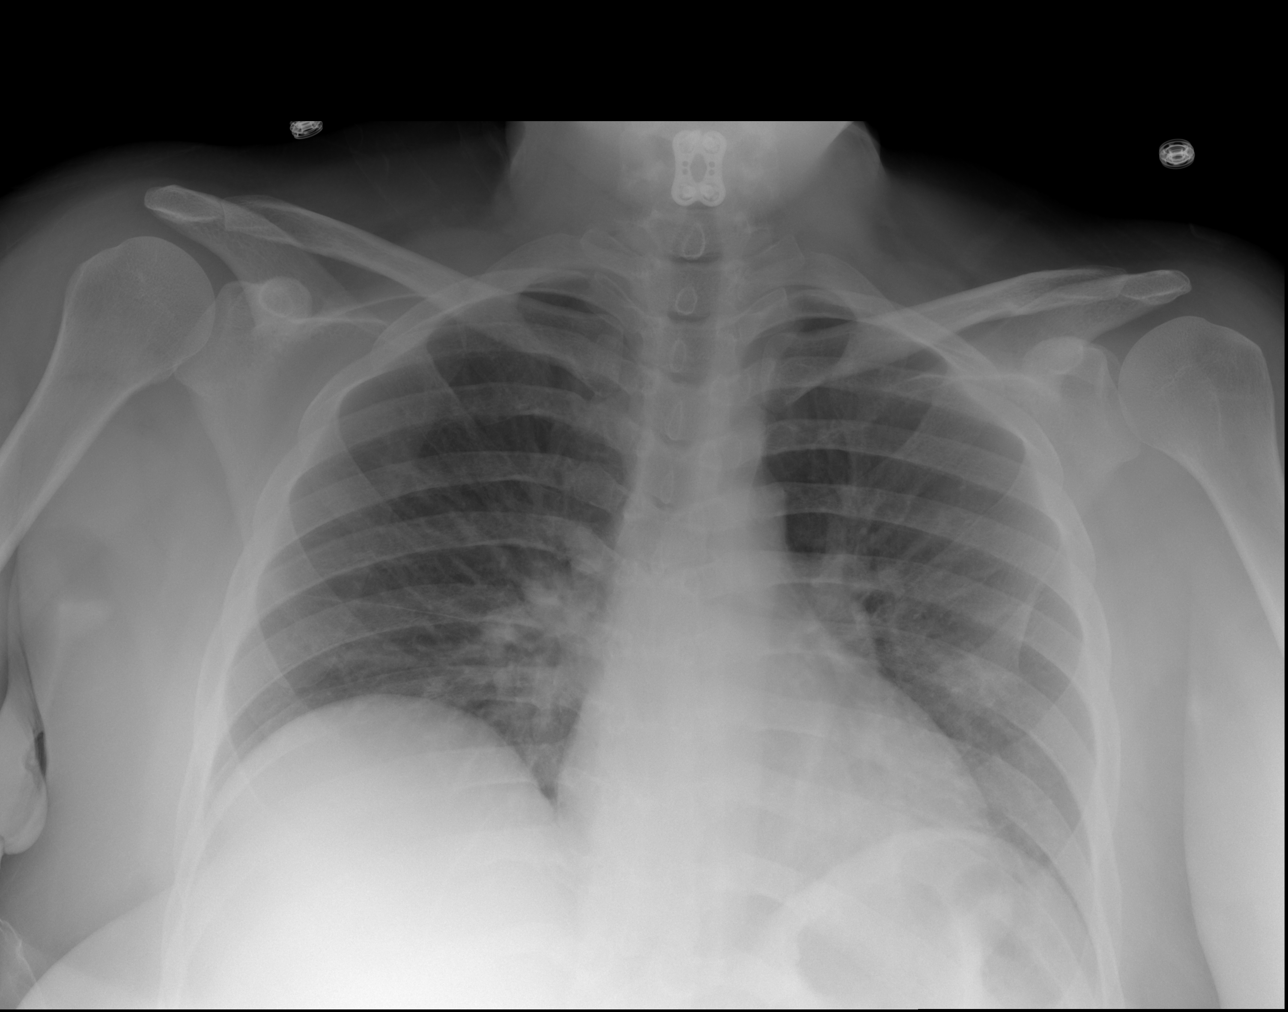

[3 of 3 positions shown; findings below may reference images not displayed]

FINDINGS: The lungs are poorly aerated and there is compression of
markings at the lung bases.  No definite active process is seen.
Heart size is normal.  A lower anterior cervical spine fusion plate
is present.

Supine and left lateral decubitus films of the abdomen show both
large and small bowel gas to be present.  No definite bowel
obstruction is seen.  No free intraperitoneal air is noted.  No
opaque calculi are seen.
IMPRESSION: 1.  Poor inspiration.  No active process.
2.  No evidence of bowel obstruction.  Both large and small bowel
gas is present.  No free air.

## 2013-04-05 IMAGING — CR DG CHEST 1V PORT
1 series · 1 of 1 positions shown · non-contrast
Comparison: 08/29/2011

CLINICAL DATA: Chest pain and dyspnea

PORTABLE CHEST - 1 VIEW

[AP]
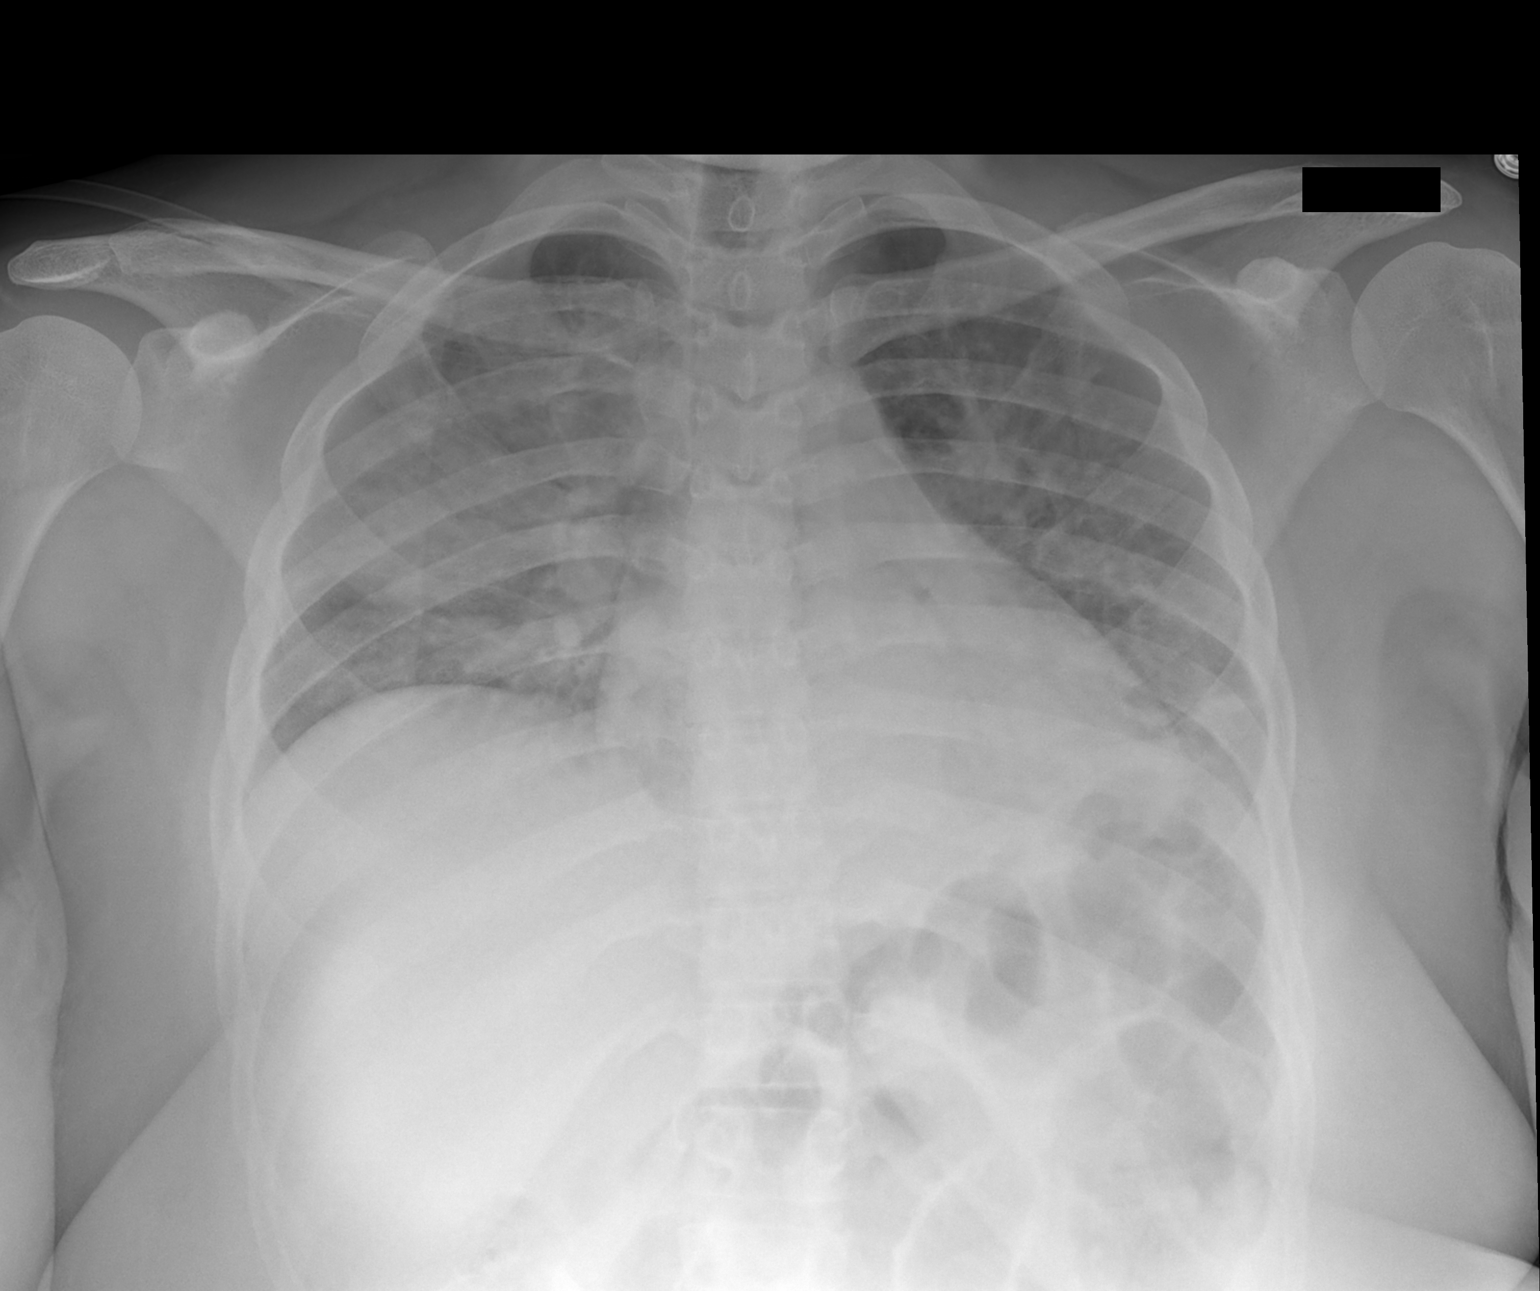

[1 of 1 positions shown; findings below may reference images not displayed]

FINDINGS: The heart size is within normal limits for portable
technique.  Diffuse bilateral opacities, most prominent in the
right upper lung and left lung base medially are suspicious for
bilateral airspace disease.  The pulmonary vascularity is
prominent, but may be accentuated by low lung volumes.  No visible
pleural effusion.  No acute bony abnormality identified.
IMPRESSION: Bilateral airspace disease.  This could reflect multifocal
pneumonia, aspiration, or less likely pulmonary edema.

## 2013-05-22 ENCOUNTER — Encounter: Payer: Self-pay | Admitting: Physical Medicine & Rehabilitation

## 2013-05-22 ENCOUNTER — Encounter: Payer: Medicaid Other | Attending: Physical Medicine and Rehabilitation | Admitting: Physical Medicine & Rehabilitation

## 2013-05-22 VITALS — BP 131/81 | HR 93 | Resp 14 | Ht 65.0 in | Wt 256.0 lb

## 2013-05-22 DIAGNOSIS — G834 Cauda equina syndrome: Secondary | ICD-10-CM | POA: Insufficient documentation

## 2013-05-22 DIAGNOSIS — M5126 Other intervertebral disc displacement, lumbar region: Secondary | ICD-10-CM

## 2013-05-22 DIAGNOSIS — M48061 Spinal stenosis, lumbar region without neurogenic claudication: Secondary | ICD-10-CM | POA: Insufficient documentation

## 2013-05-22 MED ORDER — GABAPENTIN 600 MG PO TABS: 600.0000 mg | ORAL_TABLET | Freq: Three times a day (TID) | ORAL | Status: DC

## 2013-05-22 MED ORDER — OXYCODONE-ACETAMINOPHEN 10-325 MG PO TABS: 1.0000 | ORAL_TABLET | ORAL | Status: DC | PRN

## 2013-05-22 MED ORDER — AMITRIPTYLINE HCL 25 MG PO TABS: 25.0000 mg | ORAL_TABLET | Freq: Every day | ORAL | Status: DC

## 2013-05-22 MED ORDER — OXYCODONE HCL ER 15 MG PO T12A: 15.0000 mg | EXTENDED_RELEASE_TABLET | Freq: Two times a day (BID) | ORAL | Status: DC

## 2013-05-22 NOTE — Progress Notes (Signed)
Subjective:    Patient ID: [name], female    DOB: 03-Dec-1970, 42 y.o.   MRN: [id number]  HPI  [name] regarding her cauda equina syndrome. She had cervical surgery since I last saw her. This has gone well. However, her back/legs have not been as good. She has seen Dr. Nelva Bush who has discussed a spinal stimulator. For pain she's only on gabapentin with oxycontin and percocet as well.   She has not been exercising much or in the pool. She remembers that when she was active in the pool, her pain was better.   Pain Inventory Average Pain 10 Pain Right Now 10 My pain is sharp, burning, dull, stabbing, tingling and aching  In the last 24 hours, has pain interfered with the following? General activity 9 Relation with others 7 Enjoyment of life 7 What TIME of day is your pain at its worst? all the time Sleep (in general) Poor  Pain is worse with: walking, bending, sitting and standing Pain improves with: medication Relief from Meds: 6  Mobility walk without assistance walk with assistance use a cane use a walker do you drive?  no  Function not employed: date last employed . I need assistance with the following:  household duties and shopping  Neuro/Psych weakness numbness tingling trouble walking spasms  Prior Studies Any changes since last visit?  no  Physicians involved in your care Any changes since last visit?  no   Family History  Problem Relation Age of Onset  . Anesthesia problems Neg Hx   . Hypertension Maternal Grandmother    History   Social History  . Marital Status: Divorced    Spouse Name: N/A    Number of Children: N/A  . Years of Education: N/A   Social History Main Topics  . Smoking status: Never Smoker   . Smokeless tobacco: Never Used  . Alcohol Use: No  . Drug Use: No  . Sexual Activity: Not Currently    Birth Control/ Protection: None     Comment: IUD removed 2 wks ago.   Other Topics Concern  . None   Social  History Narrative  . None   Past Surgical History  Procedure Laterality Date  . Neck surgery    . Cervical spine surgery  10  . Myomectomy  13  . Wisdom tooth extraction    . Cesarean section  1996  . Abdominal hysterectomy  06/29/11    Robotic Assisted Hysterectomy  . Lumbar laminectomy  08/31/2011    Procedure: MICRODISCECTOMY LUMBAR LAMINECTOMY;  Surgeon: Tobi Bastos, MD;  Location: [facility];  Service: Orthopedics;  Laterality: N/A;  lumbar five sacral one central microdisectomy  . Posterior fusion cervical spine  02/02/2013    C 5 C6     [name]  . Posterior cervical fusion/foraminotomy N/A 02/02/2013    Procedure: POSTERIOR C5-6 SPINAL FUSION;  Surgeon: Melina Schools, MD;  Location: [place];  Service: Orthopedics;  Laterality: N/A;   Past Medical History  Diagnosis Date  . DVT (deep venous thrombosis)     ? vs lupus  . Fibroids   . Headache(784.0)   . Difficult intravenous access     hands are usually best for blood draws and IV starts  . Neck pain   . Arthritis   . Anemia     hx  . Complication of anesthesia     slow to wake up,   very difficult to get iv had picc line last  time  . Scoliosis    BP 131/81  Pulse 93  Resp 14  Ht 5\' 5"  (1.651 m)  Wt 256 lb (116.121 kg)  BMI 42.60 kg/m2  SpO2 99%  LMP 02/27/2011  Opioid Risk Score:   Fall Risk Score: Moderate Fall Risk (6-13 points) (patient educated handout declined)   Review of Systems  Musculoskeletal: Positive for arthralgias, gait problem and myalgias.  Neurological: Positive for weakness.  All other systems reviewed and are negative.       Objective:   Physical Exam General: Alert and oriented x 3. She remains obese.  HEENT: Head is normocephalic, atraumatic, PERRLA, EOMI, sclera anicteric, oral mucosa pink and moist, dentition intact, ext ear canals clear,  Neck: Supple without JVD or lymphadenopathy  Heart: Reg rate and rhythm. No murmurs rubs or gallops  Chest: CTA bilaterally without wheezes,  rales, or rhonchi; no distress  Abdomen: Soft, non-tender, non-distended, bowel sounds positive.  Extremities: No clubbing, cyanosis, or edema. Pulses are 2+  Skin: Clean and intact without signs of breakdown- woiund clean and intact.  Neuro: Pt is cognitively appropriate with normal insight, memory, and awareness. Cranial nerves 2-12 are intact. Sensory exam  RLE is 1/2. Reflexes are 1+ in LE's. Fine motor coordination is intact. No tremors. Srtength in the Right HF, ADF, APF, KE, HAB, HE is grossly 4/5. HAD and HF was 4+5/5. LLE grossly 5/5 with pain inhibiion proximally.  Musculoskeletal: lumbar flexion 50 degrees, ext 15 degrees, lat rotation 20 degrees, lat bending 15 degrees. . Posture is fair. She has pain with palpation over the lower lumbar spine and paraspinals.  Psych: Pt's affect is appropriate. Pt is cooperative    Assessment & Plan:   1. 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 Status post decompressive laminectomy  2. Add elavil 25mg  qhs for neuro pain. 3. Pain Management: oxycontin and percocet were refilled today.   4. Neuropathic pain. Increase gabapentin to 600mg  TID 5. Follow up with ortho regarding spinal stimulator, although she needs to exhaust all conservative options first. She was doing quite a bit better when she was active with exercise, pool, etc last year. .  6. DC robaxin and try baclofen prn tid for muscle spasms and cramps  7. Neurogenic bowel and bladder- still with  constipation 8. Follow up in 1 months. All questions were encouraged and answered.

## 2013-05-22 NOTE — Patient Instructions (Signed)
CONTINUE TO WORK ON BACK RANGE OF MOTION, GOOD POSTURE, GOOD TECHNIQUE WITH YOUR WALKING   GABAPENTIN DAY1-4  300-300-600 DAY 5-8  600-300-600 THEN DAY 9+ TAKE 600MG  THREE X DAILY

## 2013-07-12 ENCOUNTER — Encounter: Payer: Medicaid Other | Attending: Physical Medicine and Rehabilitation | Admitting: Physical Medicine & Rehabilitation

## 2013-07-12 ENCOUNTER — Encounter: Payer: Self-pay | Admitting: Physical Medicine & Rehabilitation

## 2013-07-12 VITALS — BP 123/73 | HR 104 | Resp 14 | Ht 65.0 in | Wt 255.2 lb

## 2013-07-12 DIAGNOSIS — G834 Cauda equina syndrome: Secondary | ICD-10-CM

## 2013-07-12 DIAGNOSIS — E860 Dehydration: Secondary | ICD-10-CM

## 2013-07-12 DIAGNOSIS — M5126 Other intervertebral disc displacement, lumbar region: Secondary | ICD-10-CM

## 2013-07-12 DIAGNOSIS — M48061 Spinal stenosis, lumbar region without neurogenic claudication: Secondary | ICD-10-CM

## 2013-07-12 MED ORDER — OXYCODONE HCL ER 10 MG PO T12A: 10.0000 mg | EXTENDED_RELEASE_TABLET | Freq: Two times a day (BID) | ORAL | Status: DC

## 2013-07-12 MED ORDER — AMITRIPTYLINE HCL 50 MG PO TABS: 50.0000 mg | ORAL_TABLET | Freq: Every day | ORAL | Status: DC

## 2013-07-12 MED ORDER — OXYCODONE-ACETAMINOPHEN 10-325 MG PO TABS: 1.0000 | ORAL_TABLET | ORAL | Status: DC | PRN

## 2013-07-12 NOTE — Progress Notes (Signed)
Subjective:    Patient ID: [name], female    DOB: 1970/10/12, 43 y.o.   MRN: [id number]  HPI  [name] is back regarding her cauda equina syndrome. She has had good results with the elavil and gabapentin in regards to her neuropathic pain. She has experienced more constipation.  She is still in the process of evaluation regarding a spinal stimulator. She sees the psychologist soon.   [name] wants to get back into water aerobics at the aquatic center.   Her oxycontin and oxycodone remain at 15mg  and 10/325 respectively.  Pain Inventory Average Pain 9 Pain Right Now 9 My pain is sharp, burning, dull, stabbing, tingling and aching  In the last 24 hours, has pain interfered with the following? General activity 8 Relation with others 9 Enjoyment of life 9 What TIME of day is your pain at its worst? all Sleep (in general) Poor  Pain is worse with: walking, bending and standing Pain improves with: medication Relief from Meds: 7  Mobility use a cane ability to climb steps?  no do you drive?  no  Function not employed: date last employed 2013 I need assistance with the following:  bathing, toileting and household duties  Neuro/Psych tingling trouble walking spasms dizziness  Prior Studies Any changes since last visit?  no  Physicians involved in your care Any changes since last visit?  no   Family History  Problem Relation Age of Onset  . Anesthesia problems Neg Hx   . Hypertension Maternal Grandmother    History   Social History  . Marital Status: Divorced    Spouse Name: N/A    Number of Children: N/A  . Years of Education: N/A   Social History Main Topics  . Smoking status: Never Smoker   . Smokeless tobacco: Never Used  . Alcohol Use: No  . Drug Use: No  . Sexual Activity: Not Currently    Birth Control/ Protection: None     Comment: IUD removed 2 wks ago.   Other Topics Concern  . None   Social History Narrative  . None   Past  Surgical History  Procedure Laterality Date  . Neck surgery    . Cervical spine surgery  10  . Myomectomy  13  . Wisdom tooth extraction    . Cesarean section  1996  . Abdominal hysterectomy  06/29/11    Robotic Assisted Hysterectomy  . Lumbar laminectomy  08/31/2011    Procedure: MICRODISCECTOMY LUMBAR LAMINECTOMY;  Surgeon: Tobi Bastos, MD;  Location: [facility];  Service: Orthopedics;  Laterality: N/A;  lumbar five sacral one central microdisectomy  . Posterior fusion cervical spine  02/02/2013    C 5 C6     [name]  . Posterior cervical fusion/foraminotomy N/A 02/02/2013    Procedure: POSTERIOR C5-6 SPINAL FUSION;  Surgeon: Melina Schools, MD;  Location: [facility];  Service: Orthopedics;  Laterality: N/A;   Past Medical History  Diagnosis Date  . DVT (deep venous thrombosis)     ? vs lupus  . Fibroids   . Headache(784.0)   . Difficult intravenous access     hands are usually best for blood draws and IV starts  . Neck pain   . Arthritis   . Anemia     hx  . Complication of anesthesia     slow to wake up,   very difficult to get iv had picc line last time  . Scoliosis    BP 123/73  Pulse  104  Resp 14  Ht 5\' 5"  (1.651 m)  Wt 255 lb 3.2 oz (115.758 kg)  BMI 42.47 kg/m2  SpO2 100%  LMP 02/27/2011  Opioid Risk Score:   Fall Risk Score: Moderate Fall Risk (6-13 points) (educated and handout declined at last visit)  Review of Systems  Constitutional: Positive for diaphoresis and unexpected weight change.  Gastrointestinal: Positive for nausea, abdominal pain and constipation.  Musculoskeletal: Positive for gait problem.       Spasms  Neurological: Positive for dizziness.       Tingling  All other systems reviewed and are negative.      Objective:   Physical Exam   General: Alert and oriented x 3. She remains obese.  HEENT: Head is normocephalic, atraumatic, PERRLA, EOMI, sclera anicteric, oral mucosa pink and moist, dentition intact, ext ear canals clear,  Neck:  Supple without JVD or lymphadenopathy  Heart: Reg rate and rhythm. No murmurs rubs or gallops  Chest: CTA bilaterally without wheezes, rales, or rhonchi; no distress  Abdomen: Soft, non-tender, non-distended, bowel sounds positive.  Extremities: No clubbing, cyanosis, or edema. Pulses are 2+  Skin: Clean and intact without signs of breakdown- woiund clean and intact.  Neuro: Pt is cognitively appropriate with normal insight, memory, and awareness. Cranial nerves 2-12 are intact. Sensory exam RLE is 1/2. Reflexes are 1+ in LE's. Fine motor coordination is intact. No tremors. Srtength in the Right HF, ADF, APF, KE, HAB, HE is grossly 4+/5. HAD and HF was 4+5/5. LLE grossly 5/5 with pain inhibiion proximally. Ambulation better with improved balance and weight shift Musculoskeletal: lumbar flexion 50 degrees, ext 15 degrees, lat rotation 20 degrees, lat bending 15 degrees. . Posture is fair. She has pain with palpation over the lower lumbar spine and paraspinals.  Psych: Pt's affect is appropriate. Pt is cooperative    Assessment & Plan:   1. 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 Status post decompressive laminectomy  2. Increase elavil to 50mg -100mg  qhs  3. Pain Management: oxycontin and percocet were refilled today. i decreased her oxy cr to 10mg   4. Neuropathic pain. maintain gabapentin to 600mg  TID  5. Follow up with ortho regarding spinal stimulator, although she needs to exhaust all conservative options first. She was doing quite a bit better when she was active with exercise, pool, etc last year. .  6. Continue with robaxin for muscle spasms (baclofen was ineffective)  7. Neurogenic bowel and bladder- still with constipation --linzess 8. Follow up in about 1 month. All questions were encouraged and answered.    I WILL BE THE SOLE PROVIDER FOR HER PAIN MEDICATIONS.

## 2013-07-12 NOTE — Patient Instructions (Signed)
PURSUE EXERCISES INCLUDING AQUATIC THERAPY AS YOU ARE ABLE

## 2013-08-09 ENCOUNTER — Encounter: Payer: Medicaid Other | Attending: Physical Medicine and Rehabilitation | Admitting: Registered Nurse

## 2013-08-09 ENCOUNTER — Encounter: Payer: Self-pay | Admitting: Registered Nurse

## 2013-08-09 VITALS — BP 123/78 | HR 88 | Resp 14 | Wt 255.0 lb

## 2013-08-09 DIAGNOSIS — G834 Cauda equina syndrome: Secondary | ICD-10-CM | POA: Insufficient documentation

## 2013-08-09 DIAGNOSIS — M5126 Other intervertebral disc displacement, lumbar region: Secondary | ICD-10-CM | POA: Insufficient documentation

## 2013-08-09 DIAGNOSIS — M48061 Spinal stenosis, lumbar region without neurogenic claudication: Secondary | ICD-10-CM | POA: Insufficient documentation

## 2013-08-09 DIAGNOSIS — Z5181 Encounter for therapeutic drug level monitoring: Secondary | ICD-10-CM

## 2013-08-09 DIAGNOSIS — Z79899 Other long term (current) drug therapy: Secondary | ICD-10-CM

## 2013-08-09 MED ORDER — ONDANSETRON 4 MG PO TBDP: 4.0000 mg | ORAL_TABLET | Freq: Three times a day (TID) | ORAL | Status: DC | PRN

## 2013-08-09 MED ORDER — OXYCODONE-ACETAMINOPHEN 10-325 MG PO TABS: 1.0000 | ORAL_TABLET | ORAL | Status: DC | PRN

## 2013-08-09 MED ORDER — METHOCARBAMOL 500 MG PO TABS: 500.0000 mg | ORAL_TABLET | Freq: Four times a day (QID) | ORAL | Status: DC | PRN

## 2013-08-09 NOTE — Progress Notes (Signed)
Subjective:    Patient ID: [name], female    DOB: [birth year], 44 y.o.   MRN: [id number]  HPI: [name] is a 43 year old female who returns for follow up for chronic pain and medication refill. She says her pain is located in her lower back and bilateral legs. She rates her pain 7. Her current exercise regime is walking  And performing stretching exercises with the bands. She would like to attend water aerobics unable to attend due to financial hardship.   Pain Inventory Average Pain 7 Pain Right Now 7 My pain is sharp, burning, stabbing and aching  In the last 24 hours, has pain interfered with the following? General activity 8 Relation with others 7 Enjoyment of life 8 What TIME of day is your pain at its worst? varies Sleep (in general) Fair  Pain is worse with: walking, bending, sitting and standing Pain improves with: medication Relief from Meds: 5  Mobility use a cane how many minutes can you walk? 20 ability to climb steps?  yes  Function not employed: date last employed 08/2011 I need assistance with the following:  dressing, bathing, toileting, household duties and shopping  Neuro/Psych numbness tingling trouble walking spasms  Prior Studies Any changes since last visit?  no  Physicians involved in your care Any changes since last visit?  no   Family History  Problem Relation Age of Onset  . Anesthesia problems Neg Hx   . Hypertension Maternal Grandmother    History   Social History  . Marital Status: Divorced    Spouse Name: N/A    Number of Children: N/A  . Years of Education: N/A   Social History Main Topics  . Smoking status: Never Smoker   . Smokeless tobacco: Never Used  . Alcohol Use: No  . Drug Use: No  . Sexual Activity: Not Currently    Birth Control/ Protection: None     Comment: IUD removed 2 wks ago.   Other Topics Concern  . None   Social History Narrative  . None   Past Surgical History  Procedure  Laterality Date  . Neck surgery    . Cervical spine surgery  10  . Myomectomy  13  . Wisdom tooth extraction    . Cesarean section  1996  . Abdominal hysterectomy  06/29/11    Robotic Assisted Hysterectomy  . Lumbar laminectomy  08/31/2011    Procedure: MICRODISCECTOMY LUMBAR LAMINECTOMY;  Surgeon: Tobi Bastos, MD;  Location: [facility];  Service: Orthopedics;  Laterality: N/A;  lumbar five sacral one central microdisectomy  . Posterior fusion cervical spine  02/02/2013    C 5 C6     [name]  . Posterior cervical fusion/foraminotomy N/A 02/02/2013    Procedure: POSTERIOR C5-6 SPINAL FUSION;  Surgeon: Melina Schools, MD;  Location: [place];  Service: Orthopedics;  Laterality: N/A;   Past Medical History  Diagnosis Date  . DVT (deep venous thrombosis)     ? vs lupus  . Fibroids   . Headache(784.0)   . Difficult intravenous access     hands are usually best for blood draws and IV starts  . Neck pain   . Arthritis   . Anemia     hx  . Complication of anesthesia     slow to wake up,   very difficult to get iv had picc line last time  . Scoliosis    BP 123/78  Pulse 88  Resp  14  Wt 255 lb (115.667 kg)  SpO2 97%  LMP 02/27/2011  Opioid Risk Score:   Fall Risk Score: Moderate Fall Risk (6-13 points) (educated and handout given for fall prevention in the home)  Review of Systems  Gastrointestinal: Positive for nausea and constipation.  Musculoskeletal: Positive for gait problem.       Spasms  Neurological: Positive for numbness.       Tingling  All other systems reviewed and are negative.      Objective:   Physical Exam  Nursing note and vitals reviewed. Constitutional: She is oriented to person, place, and time. She appears well-developed and well-nourished.  HENT:  Head: Normocephalic and atraumatic.  Neck: Normal range of motion. Neck supple.  Cardiovascular: Normal rate, regular rhythm and normal heart sounds.   Pulmonary/Chest: Effort normal and breath sounds  normal.  Musculoskeletal:  Normal Muscle Bulk: Muscle Testing reveals: Upper extremities: Full ROM and Muscle Strength 5/5 Lumbar Paraspinal Tenderness: L-3- L-5 Lower Extremities: Left Leg Full ROM and Muscle Strength 5/5. Right Leg Flexion produces pain to Lumbar Region Arises from chair with ease. Walks with straight cane  Neurological: She is alert and oriented to person, place, and time.  Skin: Skin is warm and dry.  Psychiatric: She has a normal mood and affect.          Assessment & Plan:  1. 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 Status post decompressive laminectomy:  Refilled: Oxycodone 10mg /325 mg 1-2 tablets every 4 hours #60. Oxycontin waiting on approval from insurance company. Prior Authorization submitted. 2. Insomnia: On elavil adverse reaction with increase to 100 mg decreased to 50 mg hs. Instructed to call office if reaction continues. 3. Neuropathic pain: Continue Gabapentin to 600mg  TID  4. Muscle Spasms. Continue with Robaxin for muscle spasms  7. Neurogenic bowel and bladder- still with constipation Linzess effective.  30 minutes of face to face patient care time was spent during this visit. All questions were encouraged and answered.  F/U in 1 month

## 2013-08-21 ENCOUNTER — Telehealth: Payer: Self-pay | Admitting: *Deleted

## 2013-08-21 NOTE — Telephone Encounter (Signed)
oxycontin approved by Medicaid.  Pharmacy and [name] notified

## 2013-09-08 ENCOUNTER — Encounter: Payer: Self-pay | Admitting: Physical Medicine & Rehabilitation

## 2013-09-08 ENCOUNTER — Encounter: Payer: Medicaid Other | Attending: Physical Medicine and Rehabilitation | Admitting: Physical Medicine & Rehabilitation

## 2013-09-08 VITALS — BP 135/71 | HR 82 | Resp 14 | Wt 256.4 lb

## 2013-09-08 DIAGNOSIS — M5126 Other intervertebral disc displacement, lumbar region: Secondary | ICD-10-CM | POA: Insufficient documentation

## 2013-09-08 DIAGNOSIS — G834 Cauda equina syndrome: Secondary | ICD-10-CM | POA: Insufficient documentation

## 2013-09-08 DIAGNOSIS — M7062 Trochanteric bursitis, left hip: Secondary | ICD-10-CM

## 2013-09-08 DIAGNOSIS — M48061 Spinal stenosis, lumbar region without neurogenic claudication: Secondary | ICD-10-CM

## 2013-09-08 DIAGNOSIS — M76899 Other specified enthesopathies of unspecified lower limb, excluding foot: Secondary | ICD-10-CM

## 2013-09-08 MED ORDER — OXYCODONE-ACETAMINOPHEN 10-325 MG PO TABS: 1.0000 | ORAL_TABLET | ORAL | Status: DC | PRN

## 2013-09-08 MED ORDER — OXYCODONE HCL ER 20 MG PO T12A: 20.0000 mg | EXTENDED_RELEASE_TABLET | Freq: Two times a day (BID) | ORAL | Status: DC

## 2013-09-08 NOTE — Patient Instructions (Signed)

## 2013-09-08 NOTE — Progress Notes (Signed)
Subjective:    Patient ID: [name], female    DOB: Oct 20, 1970, 43 y.o.   MRN: [id number]  HPI  [name] is back regarding her cauda equina syndrome. She is having more pain after a railing in her closet fell down on top of her neck 3 weeks ago. It forced her down and she was bent down in the closet for several minutes before someone came in to help. Before this happened, she felt that her pain was a little better controlled.   She is going back to see her surgeon next week.   Pain Inventory Average Pain 8 Pain Right Now 9 My pain is sharp, burning, dull, stabbing, tingling and aching  In the last 24 hours, has pain interfered with the following? General activity 7 Relation with others 8 Enjoyment of life 9 What TIME of day is your pain at its worst? all Sleep (in general) Poor  Pain is worse with: walking, bending, sitting and standing Pain improves with: rest and medication Relief from Meds: 4  Mobility walk without assistance use a cane ability to climb steps?  no do you drive?  no  Function not employed: date last employed . I need assistance with the following:  dressing, bathing, household duties and shopping  Neuro/Psych numbness tingling trouble walking  Prior Studies Any changes since last visit?  no  Physicians involved in your care Any changes since last visit?  no   Family History  Problem Relation Age of Onset  . Anesthesia problems Neg Hx   . Hypertension Maternal Grandmother    History   Social History  . Marital Status: Divorced    Spouse Name: N/A    Number of Children: N/A  . Years of Education: N/A   Social History Main Topics  . Smoking status: Never Smoker   . Smokeless tobacco: Never Used  . Alcohol Use: No  . Drug Use: No  . Sexual Activity: Not Currently    Birth Control/ Protection: None     Comment: IUD removed 2 wks ago.   Other Topics Concern  . None   Social History Narrative  . None   Past Surgical History   Procedure Laterality Date  . Neck surgery    . Cervical spine surgery  10  . Myomectomy  13  . Wisdom tooth extraction    . Cesarean section  1996  . Abdominal hysterectomy  06/29/11    Robotic Assisted Hysterectomy  . Lumbar laminectomy  08/31/2011    Procedure: MICRODISCECTOMY LUMBAR LAMINECTOMY;  Surgeon: Tobi Bastos, MD;  Location: [facility];  Service: Orthopedics;  Laterality: N/A;  lumbar five sacral one central microdisectomy  . Posterior fusion cervical spine  02/02/2013    C 5 C6     [name]  . Posterior cervical fusion/foraminotomy N/A 02/02/2013    Procedure: POSTERIOR C5-6 SPINAL FUSION;  Surgeon: Melina Schools, MD;  Location: [facility];  Service: Orthopedics;  Laterality: N/A;   Past Medical History  Diagnosis Date  . DVT (deep venous thrombosis)     ? vs lupus  . Fibroids   . Headache(784.0)   . Difficult intravenous access     hands are usually best for blood draws and IV starts  . Neck pain   . Arthritis   . Anemia     hx  . Complication of anesthesia     slow to wake up,   very difficult to get iv had picc line last time  .  Scoliosis    BP 135/71  Pulse 82  Resp 14  Wt 256 lb 6.4 oz (116.302 kg)  SpO2 97%  LMP 02/27/2011  Opioid Risk Score:   Fall Risk Score: Moderate Fall Risk (6-13 points) (previously educated and given handout for fall prevention in the home)  Review of Systems  Constitutional: Positive for diaphoresis and unexpected weight change.  Gastrointestinal: Positive for nausea and constipation.  Musculoskeletal: Positive for gait problem.  Neurological: Positive for numbness.       Tingling  All other systems reviewed and are negative.      Objective:   Physical Exam  General: Alert and oriented x 3. She remains obese.  HEENT: Head is normocephalic, atraumatic, PERRLA, EOMI, sclera anicteric, oral mucosa pink and moist, dentition intact, ext ear canals clear,  Neck: Supple without JVD or lymphadenopathy  Heart: Reg rate and rhythm.  No murmurs rubs or gallops  Chest: CTA bilaterally without wheezes, rales, or rhonchi; no distress  Abdomen: Soft, non-tender, non-distended, bowel sounds positive.  Extremities: No clubbing, cyanosis, or edema. Pulses are 2+  Skin: Clean and intact without signs of breakdown- woiund clean and intact.  Neuro: Pt is cognitively appropriate with normal insight, memory, and awareness. Cranial nerves 2-12 are intact. Sensory exam RLE is 1/2. Reflexes are 1+ in LE's. Fine motor coordination is intact. No tremors. Srtength in the Right HF, ADF, APF, KE, HAB, HE is grossly 4+/5. HAD and HF was 4+5/5. LLE grossly 5/5 with pain inhibiion proximally. Ambulation better with improved balance and weight shift  Musculoskeletal: lumbar flexion 40 degrees, ext 10 degrees, lat rotation 20 degrees, lat bending 10 degrees. . Posture is fair. She has pain with palpation over the lower lumbar spine and paraspinals.  Psych: Pt's affect is appropriate. Pt is cooperative   Assessment & Plan:   1. 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 Status post decompressive laminectomy  2. Maintain elavil 100mg  qhs  3. Pain Management: oxycontin (increase back to 20mg  q 12) and percocet were refilled today.   4. Neuropathic pain. Continue  gabapentin to 600mg  TID  5. Follow up with ortho regarding spinal stimulator, although she needs to exhaust all conservative options first. She was doing quite a bit better when she was active with exercise, pool, etc last year. SHE NEEDS TO GET BACK TO HER STRETCHING!!!!!  I have noticed a decline in her ROM since I last saw her. Discussed management of her left hip, likely greater trochanter bursitis. Exercises wer provided 6. Continue with robaxin for muscle spasms   7. Neurogenic bowel and bladder- still with constipation --linzess  8. Follow up in about 1 month. All questions were encouraged and answered.

## 2013-10-09 ENCOUNTER — Encounter: Payer: Self-pay | Admitting: Registered Nurse

## 2013-10-09 ENCOUNTER — Encounter: Payer: Medicaid Other | Attending: Physical Medicine and Rehabilitation | Admitting: Registered Nurse

## 2013-10-09 VITALS — BP 153/80 | HR 83 | Resp 14 | Wt 262.4 lb

## 2013-10-09 DIAGNOSIS — G834 Cauda equina syndrome: Secondary | ICD-10-CM | POA: Insufficient documentation

## 2013-10-09 DIAGNOSIS — M5126 Other intervertebral disc displacement, lumbar region: Secondary | ICD-10-CM | POA: Insufficient documentation

## 2013-10-09 DIAGNOSIS — M48061 Spinal stenosis, lumbar region without neurogenic claudication: Secondary | ICD-10-CM | POA: Insufficient documentation

## 2013-10-09 DIAGNOSIS — Z79899 Other long term (current) drug therapy: Secondary | ICD-10-CM

## 2013-10-09 DIAGNOSIS — Z5181 Encounter for therapeutic drug level monitoring: Secondary | ICD-10-CM

## 2013-10-09 MED ORDER — OXYCODONE HCL ER 20 MG PO T12A: 20.0000 mg | EXTENDED_RELEASE_TABLET | Freq: Two times a day (BID) | ORAL | Status: DC

## 2013-10-09 MED ORDER — OXYCODONE-ACETAMINOPHEN 10-325 MG PO TABS: 1.0000 | ORAL_TABLET | ORAL | Status: DC | PRN

## 2013-10-09 NOTE — Progress Notes (Signed)
Subjective:    Patient ID: [name], female    DOB: 20-Mar-1971, 43 y.o.   MRN: [id number]  HPI: [name] is a 43 year old female who returns for follow up for chronic pain and medication refill. She says her pain is located in her lower back and bilateral legs. She rates her pain 9. Her current exercise regime is going to water aerobics three times a week. She has been encouraged to increase activity and demonstrated chair exercises she verbalizes understanding. Son in the room, he says he will help his mother become more active.  Pain Inventory Average Pain 8 Pain Right Now 9 My pain is sharp and aching  In the last 24 hours, has pain interfered with the following? General activity 7 Relation with others 7 Enjoyment of life 7 What TIME of day is your pain at its worst? all Sleep (in general) Fair  Pain is worse with: some activites Pain improves with: medication Relief from Meds: 6  Mobility walk with assistance use a cane ability to climb steps?  no do you drive?  no  Function not employed: date last employed . I need assistance with the following:  bathing, household duties and shopping  Neuro/Psych trouble walking spasms  Prior Studies Any changes since last visit?  no  Physicians involved in your care Any changes since last visit?  no   Family History  Problem Relation Age of Onset  . Anesthesia problems Neg Hx   . Hypertension Maternal Grandmother    History   Social History  . Marital Status: Divorced    Spouse Name: N/A    Number of Children: N/A  . Years of Education: N/A   Social History Main Topics  . Smoking status: Never Smoker   . Smokeless tobacco: Never Used  . Alcohol Use: No  . Drug Use: No  . Sexual Activity: Not Currently    Birth Control/ Protection: None     Comment: IUD removed 2 wks ago.   Other Topics Concern  . None   Social History Narrative  . None   Past Surgical History  Procedure Laterality Date    . Neck surgery    . Cervical spine surgery  10  . Myomectomy  13  . Wisdom tooth extraction    . Cesarean section  1996  . Abdominal hysterectomy  06/29/11    Robotic Assisted Hysterectomy  . Lumbar laminectomy  08/31/2011    Procedure: MICRODISCECTOMY LUMBAR LAMINECTOMY;  Surgeon: Tobi Bastos, MD;  Location: [facility];  Service: Orthopedics;  Laterality: N/A;  lumbar five sacral one central microdisectomy  . Posterior fusion cervical spine  02/02/2013    C 5 C6     [name]  . Posterior cervical fusion/foraminotomy N/A 02/02/2013    Procedure: POSTERIOR C5-6 SPINAL FUSION;  Surgeon: Melina Schools, MD;  Location: [facility];  Service: Orthopedics;  Laterality: N/A;   Past Medical History  Diagnosis Date  . DVT (deep venous thrombosis)     ? vs lupus  . Fibroids   . Headache(784.0)   . Difficult intravenous access     hands are usually best for blood draws and IV starts  . Neck pain   . Arthritis   . Anemia     hx  . Complication of anesthesia     slow to wake up,   very difficult to get iv had picc line last time  . Scoliosis    BP 153/80  Pulse 83  Resp 14  Wt 262 lb 6.4 oz (119.024 kg)  SpO2 100%  LMP 02/27/2011  Opioid Risk Score:   Fall Risk Score: Moderate Fall Risk (6-13 points) (previously educated and given handout) Review of Systems  Gastrointestinal: Positive for constipation.  Musculoskeletal: Positive for gait problem.       Spasms  All other systems reviewed and are negative.      Objective:   Physical Exam  Nursing note and vitals reviewed. Constitutional: She is oriented to person, place, and time. She appears well-developed and well-nourished.  HENT:  Head: Normocephalic and atraumatic.  Neck: Normal range of motion. Neck supple.  Cardiovascular: Normal rate and regular rhythm.   Pulmonary/Chest: Effort normal and breath sounds normal.  Musculoskeletal:  Normal Muscle Bulk and muscle testing Reveals: Upper Extremities: Full ROM and Muscle  strength 5/5 Spinal Forward Flexion 30 degrees and Extension: 20 Degrees Thoracic and Lumbar Hypersensitivity Lower Extremities: Full ROM and Muscle Strength 5/5 Upon Her Standing Muscle Spasms Noted. Using Straight cane for support Pitting Edema to LE's +1    Neurological: She is alert and oriented to person, place, and time.  Skin: Skin is warm and dry.  Psychiatric: She has a normal mood and affect.          Assessment & Plan:  1. 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 Status post decompressive laminectomy:  Refilled: Oxycodone 10mg /325 mg 1-2 tablets every 4 hours #60. Oxycontin 20 mg one tablet every 12 hours as needed. #60. 2. Insomnia: On elavil 50 mg hs.  3. Neuropathic pain: Continue Gabapentin to 600mg  TID  4. Muscle Spasms. Continue with Robaxin for muscle spasms  5. Neurogenic bowel and bladder- still with constipation Linzess effective.   30 minutes of face to face patient care time was spent during this visit. All questions were encouraged and answered.   F/U in 1 month

## 2013-10-09 NOTE — Patient Instructions (Signed)
Try ICY HOT or Arnicare Gel  See if these will help with pain relief

## 2013-11-07 ENCOUNTER — Encounter: Payer: Medicaid Other | Attending: Physical Medicine and Rehabilitation | Admitting: Registered Nurse

## 2013-11-07 ENCOUNTER — Encounter: Payer: Self-pay | Admitting: Registered Nurse

## 2013-11-07 VITALS — BP 140/68 | HR 103 | Resp 14 | Ht 65.0 in | Wt 262.0 lb

## 2013-11-07 DIAGNOSIS — Z79899 Other long term (current) drug therapy: Secondary | ICD-10-CM

## 2013-11-07 DIAGNOSIS — M7062 Trochanteric bursitis, left hip: Secondary | ICD-10-CM

## 2013-11-07 DIAGNOSIS — G834 Cauda equina syndrome: Secondary | ICD-10-CM | POA: Diagnosis present

## 2013-11-07 DIAGNOSIS — M48061 Spinal stenosis, lumbar region without neurogenic claudication: Secondary | ICD-10-CM | POA: Diagnosis present

## 2013-11-07 DIAGNOSIS — M5126 Other intervertebral disc displacement, lumbar region: Secondary | ICD-10-CM

## 2013-11-07 DIAGNOSIS — M76899 Other specified enthesopathies of unspecified lower limb, excluding foot: Secondary | ICD-10-CM

## 2013-11-07 DIAGNOSIS — Z5181 Encounter for therapeutic drug level monitoring: Secondary | ICD-10-CM

## 2013-11-07 MED ORDER — OXYCODONE HCL ER 20 MG PO T12A: 20.0000 mg | EXTENDED_RELEASE_TABLET | Freq: Two times a day (BID) | ORAL | Status: DC

## 2013-11-07 MED ORDER — OXYCODONE-ACETAMINOPHEN 10-325 MG PO TABS: 1.0000 | ORAL_TABLET | ORAL | Status: DC | PRN

## 2013-11-07 MED ORDER — METHYLPREDNISOLONE 4 MG PO KIT: PACK | ORAL | Status: DC

## 2013-11-07 NOTE — Progress Notes (Signed)
Subjective:    Patient ID: [name], female    DOB: Feb 02, 1971, 43 y.o.   MRN: [id number]  HPI: [name] is a 43 year old female who returns for follow up for chronic pain and medication refill. She says her pain is located in her lower back and bilateral legs. She rates her pain 9. Her current exercise regime is going to [employer] for water aerobics three times a week. Also using the [employer]. Encouraged to increase her activity in small increments. She verbalizes understanding. She seen Dr. Rolena Infante he will be scheduling her for a MRI.  Pain Inventory Average Pain 8 Pain Right Now 9 My pain is sharp, stabbing and aching  In the last 24 hours, has pain interfered with the following? General activity 8 Relation with others 10 Enjoyment of life 8 What TIME of day is your pain at its worst? constant all day Sleep (in general) Fair  Pain is worse with: walking, bending, sitting, standing and some activites Pain improves with: medication Relief from Meds: 7  Mobility walk with assistance use a cane do you drive?  no transfers alone  Function not employed: date last employed na I need assistance with the following:  dressing, bathing, household duties and shopping  Neuro/Psych numbness tingling trouble walking spasms  Prior Studies Any changes since last visit?  no  Physicians involved in your care Any changes since last visit?  no   Family History  Problem Relation Age of Onset  . Anesthesia problems Neg Hx   . Hypertension Maternal Grandmother    History   Social History  . Marital Status: Divorced    Spouse Name: N/A    Number of Children: N/A  . Years of Education: N/A   Social History Main Topics  . Smoking status: Never Smoker   . Smokeless tobacco: Never Used  . Alcohol Use: No  . Drug Use: No  . Sexual Activity: Not Currently    Birth Control/ Protection: None     Comment: IUD removed 2 wks ago.   Other Topics Concern  . None    Social History Narrative  . None   Past Surgical History  Procedure Laterality Date  . Neck surgery    . Cervical spine surgery  10  . Myomectomy  13  . Wisdom tooth extraction    . Cesarean section  1996  . Abdominal hysterectomy  06/29/11    Robotic Assisted Hysterectomy  . Lumbar laminectomy  08/31/2011    Procedure: MICRODISCECTOMY LUMBAR LAMINECTOMY;  Surgeon: Tobi Bastos, MD;  Location: [facility];  Service: Orthopedics;  Laterality: N/A;  lumbar five sacral one central microdisectomy  . Posterior fusion cervical spine  02/02/2013    C 5 C6     [name]  . Posterior cervical fusion/foraminotomy N/A 02/02/2013    Procedure: POSTERIOR C5-6 SPINAL FUSION;  Surgeon: Melina Schools, MD;  Location: [facility];  Service: Orthopedics;  Laterality: N/A;   Past Medical History  Diagnosis Date  . DVT (deep venous thrombosis)     ? vs lupus  . Fibroids   . Headache(784.0)   . Difficult intravenous access     hands are usually best for blood draws and IV starts  . Neck pain   . Arthritis   . Anemia     hx  . Complication of anesthesia     slow to wake up,   very difficult to get iv had picc line last time  .  Scoliosis    BP 140/68  Pulse 103  Resp 14  Ht 5\' 5"  (1.651 m)  Wt 262 lb (118.842 kg)  BMI 43.60 kg/m2  SpO2 98%  LMP 02/27/2011  Opioid Risk Score:   Fall Risk Score: Moderate Fall Risk (6-13 points) (pt educated, brochure declined)    Review of Systems  Constitutional: Positive for diaphoresis and unexpected weight change.  Cardiovascular: Positive for leg swelling.  Gastrointestinal: Positive for nausea and constipation.  Musculoskeletal: Positive for gait problem.  Neurological: Positive for numbness.       Tingling, spasms  All other systems reviewed and are negative.      Objective:   Physical Exam  Nursing note and vitals reviewed. Constitutional: She is oriented to person, place, and time. She appears well-developed and well-nourished.  HENT:  Head:  Normocephalic and atraumatic.  Neck: Normal range of motion. Neck supple.  Cardiovascular: Normal rate and regular rhythm.   Pulmonary/Chest: Effort normal and breath sounds normal.  Musculoskeletal:  Normal Muscle Bulk and Muscle testing Reveals: Upper Extremities: Full ROM and Muscle strength on the Right 5/5 and Left 4/5. Spinal Forward Flexion: 30 Degrees and Extension 20 degrees. Lumbar Paraspinal Tenderness: L-3- L-5 Left Greater Trochanteric Tenderness Lower Extremities: Full ROM and Muscle strength 5/5 Arises from chair with slight difficulty/ Using straight cane for support Wide Based gait  Neurological: She is alert and oriented to person, place, and time.  Skin: Skin is warm and dry.  Psychiatric: She has a normal mood and affect.          Assessment & Plan:  1. 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 Status post decompressive laminectomy:  Refilled: Oxycodone 10mg /325 mg 1-2 tablets every 4 hours #60. Oxycontin 20 mg one tablet every 12 hours as needed. #60.  2. Insomnia: On elavil 50 mg hs.  3. Neuropathic pain: Continue Gabapentin to 600mg  TID  4. Muscle Spasms. Continue with Robaxin for muscle spasms  5. Neurogenic bowel and bladder- still with constipation Linzess effective.  6. Greater Trochanteric Bursitis of Left Hip: Medrol Dose Pak. Schedule for Cortisone injection with Dr. Naaman Plummer.  30 minutes of face to face patient care time was spent during this visit. All questions were encouraged and answered.   F/U in 1 month

## 2013-11-29 ENCOUNTER — Other Ambulatory Visit: Payer: Self-pay | Admitting: Registered Nurse

## 2013-12-15 ENCOUNTER — Encounter: Payer: Medicaid Other | Attending: Physical Medicine and Rehabilitation | Admitting: Physical Medicine & Rehabilitation

## 2013-12-15 ENCOUNTER — Encounter: Payer: Self-pay | Admitting: Physical Medicine & Rehabilitation

## 2013-12-15 VITALS — BP 148/82 | HR 105 | Resp 14 | Wt 261.6 lb

## 2013-12-15 DIAGNOSIS — M48061 Spinal stenosis, lumbar region without neurogenic claudication: Secondary | ICD-10-CM | POA: Diagnosis present

## 2013-12-15 DIAGNOSIS — G894 Chronic pain syndrome: Secondary | ICD-10-CM

## 2013-12-15 DIAGNOSIS — G834 Cauda equina syndrome: Secondary | ICD-10-CM | POA: Diagnosis present

## 2013-12-15 DIAGNOSIS — M76899 Other specified enthesopathies of unspecified lower limb, excluding foot: Secondary | ICD-10-CM

## 2013-12-15 DIAGNOSIS — M5126 Other intervertebral disc displacement, lumbar region: Secondary | ICD-10-CM | POA: Diagnosis present

## 2013-12-15 DIAGNOSIS — M7062 Trochanteric bursitis, left hip: Secondary | ICD-10-CM

## 2013-12-15 DIAGNOSIS — Z5181 Encounter for therapeutic drug level monitoring: Secondary | ICD-10-CM

## 2013-12-15 DIAGNOSIS — Z79899 Other long term (current) drug therapy: Secondary | ICD-10-CM

## 2013-12-15 MED ORDER — GABAPENTIN 800 MG PO TABS: 800.0000 mg | ORAL_TABLET | Freq: Three times a day (TID) | ORAL | Status: DC

## 2013-12-15 MED ORDER — MORPHINE SULFATE ER 30 MG PO TBCR
30.0000 mg | EXTENDED_RELEASE_TABLET | Freq: Two times a day (BID) | ORAL | Status: DC
Start: 2013-12-15 — End: 2014-01-05

## 2013-12-15 NOTE — Patient Instructions (Signed)
YOU MAY COME IN 3 WEEKS TO PICK UP RX'ES AND THEN WE WILL HAVE YOUR SHORT AND LONG ACTING MEDS SYNCHED TOGETHER

## 2013-12-15 NOTE — Progress Notes (Signed)
Subjective:    Patient ID: [name], female    DOB: 02-12-1971, 43 y.o.   MRN: [id number]  HPI  [name] is back regarding her cauda equina syndrome and associated pain and functional deficits.   She has had more left hip pain over the last couple months. The hip bothers her more when she lays on it, and when she's more active.   She is also seeing rheumatology for an autoimmune work up. Lupus is being discussed apparently.   The oxycontin helps with her pain but she continues to have substantial rash and itching.   Insurance has not approved a spinal stimulator.  [name] has gotten back to the pool and is trying to exercise most days. She states that she has an MRI scheduled with contrast of her lumbar spine. Rheum is also doing some radiographical work up.   Pain Inventory Average Pain 8 Pain Right Now 10 My pain is burning, tingling and aching  In the last 24 hours, has pain interfered with the following? General activity 7 Relation with others 8 Enjoyment of life 10 What TIME of day is your pain at its worst? all Sleep (in general) Fair  Pain is worse with: walking, inactivity and standing Pain improves with: medication Relief from Meds: 7  Mobility use a cane ability to climb steps?  no do you drive?  no  Function I need assistance with the following:  meal prep, household duties and shopping  Neuro/Psych numbness tremor tingling trouble walking spasms  Prior Studies Any changes since last visit?  no  Physicians involved in your care Any changes since last visit?  no   Family History  Problem Relation Age of Onset  . Anesthesia problems Neg Hx   . Hypertension Maternal Grandmother    History   Social History  . Marital Status: Divorced    Spouse Name: N/A    Number of Children: N/A  . Years of Education: N/A   Social History Main Topics  . Smoking status: Never Smoker   . Smokeless tobacco: Never Used  . Alcohol Use: No  . Drug  Use: No  . Sexual Activity: Not Currently    Birth Control/ Protection: None     Comment: IUD removed 2 wks ago.   Other Topics Concern  . None   Social History Narrative  . None   Past Surgical History  Procedure Laterality Date  . Neck surgery    . Cervical spine surgery  10  . Myomectomy  13  . Wisdom tooth extraction    . Cesarean section  1996  . Abdominal hysterectomy  06/29/11    Robotic Assisted Hysterectomy  . Lumbar laminectomy  08/31/2011    Procedure: MICRODISCECTOMY LUMBAR LAMINECTOMY;  Surgeon: Tobi Bastos, MD;  Location: [facility];  Service: Orthopedics;  Laterality: N/A;  lumbar five sacral one central microdisectomy  . Posterior fusion cervical spine  02/02/2013    C 5 C6     [name]  . Posterior cervical fusion/foraminotomy N/A 02/02/2013    Procedure: POSTERIOR C5-6 SPINAL FUSION;  Surgeon: Melina Schools, MD;  Location: [facility];  Service: Orthopedics;  Laterality: N/A;   Past Medical History  Diagnosis Date  . DVT (deep venous thrombosis)     ? vs lupus  . Fibroids   . Headache(784.0)   . Difficult intravenous access     hands are usually best for blood draws and IV starts  . Neck pain   .  Arthritis   . Anemia     hx  . Complication of anesthesia     slow to wake up,   very difficult to get iv had picc line last time  . Scoliosis    BP 148/82  Pulse 105  Resp 14  Wt 261 lb 9.6 oz (118.661 kg)  SpO2 95%  LMP 02/27/2011  Opioid Risk Score:   Fall Risk Score: Moderate Fall Risk (6-13 points) (previoulsy educated and given handout)  Review of Systems  Gastrointestinal: Positive for constipation.  Musculoskeletal: Positive for gait problem.       Spasms  Neurological: Positive for tremors, weakness and numbness.       Tingling  All other systems reviewed and are negative.      Objective:   Physical Exam  General: Alert and oriented x 3. She remains obese.  HEENT: Head is normocephalic, atraumatic, PERRLA, EOMI, sclera anicteric, oral  mucosa pink and moist, dentition intact, ext ear canals clear,  Neck: Supple without JVD or lymphadenopathy  Heart: Reg rate and rhythm. No murmurs rubs or gallops  Chest: CTA bilaterally without wheezes, rales, or rhonchi; no distress  Abdomen: Soft, non-tender, non-distended, bowel sounds positive.  Extremities: No clubbing, cyanosis, or edema. Pulses are 2+  Skin: Clean and intact without signs of breakdown- woiund clean and intact.  Neuro: Pt is cognitively appropriate with normal insight, memory, and awareness. Cranial nerves 2-12 are intact. Sensory exam RLE is 1/2. Reflexes are 1+ in LE's. Fine motor coordination is intact. No tremors. Srtength in the Right HF, ADF, APF, KE, HAB, HE is grossly 4+/5. HAD and HF was 4+5/5. LLE grossly 5/5 with pain inhibiion proximally. Antalgic to the left.  Musculoskeletal: lumbar flexion 40 degrees, ext 10 degrees, lat rotation 20 degrees, lat bending 10 degrees. . Posture is fair. She has pain with palpation over the lower lumbar spine and paraspinals.  Psych: Pt's affect is appropriate. Pt is cooperative   Assessment & Plan:   1. 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 Status post decompressive laminectomy  2. Maintain elavil 100mg  qhs  3. Pain Management: change oxcontin to ms contin 30mg  q 12 (for itching/rash)   -percocet was refill was not needed today.  4. Neuropathic pain. increase gabapentin to 900 mg TID  5. Continue with water and HEP. 6. Continue with robaxin for muscle spasms  7. After informed consent and preparation of the skin with betadine and isopropyl alcohol, I injected 6mg  (1cc) of celestone and 4cc of 1% lidocaine around the left greater trochanter via lateral approach. Additionally, aspiration was performed prior to injection. The patient tolerated well, and no complications were encountered. Afterward the area was cleaned and dressed. Post- injection instructions were provided. Discussed stretching, ice, etc.  8. ?lupus  syndrome 9. Follow up in about 7 weeks. She can pick up rx'es in 3 weeks

## 2013-12-21 ENCOUNTER — Telehealth: Payer: Self-pay | Admitting: *Deleted

## 2013-12-21 NOTE — Telephone Encounter (Signed)
Faxed PA for patient to [facility]

## 2014-01-05 ENCOUNTER — Encounter: Payer: Medicaid Other | Attending: Physical Medicine and Rehabilitation | Admitting: Registered Nurse

## 2014-01-05 ENCOUNTER — Encounter: Payer: Self-pay | Admitting: Registered Nurse

## 2014-01-05 VITALS — BP 119/74 | HR 90 | Resp 14 | Ht 65.0 in | Wt 262.2 lb

## 2014-01-05 DIAGNOSIS — G834 Cauda equina syndrome: Secondary | ICD-10-CM | POA: Diagnosis present

## 2014-01-05 DIAGNOSIS — M5126 Other intervertebral disc displacement, lumbar region: Secondary | ICD-10-CM

## 2014-01-05 DIAGNOSIS — M4806 Spinal stenosis, lumbar region: Secondary | ICD-10-CM

## 2014-01-05 DIAGNOSIS — Z5181 Encounter for therapeutic drug level monitoring: Secondary | ICD-10-CM

## 2014-01-05 DIAGNOSIS — Z79899 Other long term (current) drug therapy: Secondary | ICD-10-CM

## 2014-01-05 DIAGNOSIS — G894 Chronic pain syndrome: Secondary | ICD-10-CM

## 2014-01-05 DIAGNOSIS — M48061 Spinal stenosis, lumbar region without neurogenic claudication: Secondary | ICD-10-CM

## 2014-01-05 MED ORDER — OXYCODONE-ACETAMINOPHEN 10-325 MG PO TABS: 1.0000 | ORAL_TABLET | ORAL | Status: DC | PRN

## 2014-01-05 MED ORDER — MORPHINE SULFATE ER 30 MG PO TBCR: 30.0000 mg | EXTENDED_RELEASE_TABLET | Freq: Two times a day (BID) | ORAL | Status: DC

## 2014-01-05 NOTE — Progress Notes (Signed)
Subjective:    Patient ID: [name], female    DOB: 10/10/1970, 43 y.o.   MRN: [id number]  HPI: [name] is a 43 year old female who returns for follow up for chronic pain and medication refill. She says her pain is located in her lower back and bilateral legs. She rates her pain 10. Her current exercise regime is going to [employer] for water aerobics three times a week.  Status post Left hip injection with good relief noted.  Pain Inventory Average Pain 8 Pain Right Now 10 My pain is constant, sharp, dull, stabbing, tingling and aching  In the last 24 hours, has pain interfered with the following? General activity 8 Relation with others 10 Enjoyment of life 9 What TIME of day is your pain at its worst? All Sleep (in general) Fair  Pain is worse with: walking, bending, sitting and standing Pain improves with: therapy/exercise and medication Relief from Meds: 5  Mobility use a cane ability to climb steps?  no do you drive?  no  Function not employed: date last employed .  Neuro/Psych numbness trouble walking spasms dizziness  Prior Studies Any changes since last visit?  no  Physicians involved in your care Any changes since last visit?  no   Family History  Problem Relation Age of Onset  . Anesthesia problems Neg Hx   . Hypertension Maternal Grandmother    History   Social History  . Marital Status: Divorced    Spouse Name: N/A    Number of Children: N/A  . Years of Education: N/A   Social History Main Topics  . Smoking status: Never Smoker   . Smokeless tobacco: Never Used  . Alcohol Use: No  . Drug Use: No  . Sexual Activity: Not Currently    Birth Control/ Protection: None     Comment: IUD removed 2 wks ago.   Other Topics Concern  . Not on file   Social History Narrative  . No narrative on file   Past Surgical History  Procedure Laterality Date  . Neck surgery    . Cervical spine surgery  10  . Myomectomy  13  .  Wisdom tooth extraction    . Cesarean section  1996  . Abdominal hysterectomy  06/29/11    Robotic Assisted Hysterectomy  . Lumbar laminectomy  08/31/2011    Procedure: MICRODISCECTOMY LUMBAR LAMINECTOMY;  Surgeon: Tobi Bastos, MD;  Location: [facility];  Service: Orthopedics;  Laterality: N/A;  lumbar five sacral one central microdisectomy  . Posterior fusion cervical spine  02/02/2013    C 5 C6     [name]  . Posterior cervical fusion/foraminotomy N/A 02/02/2013    Procedure: POSTERIOR C5-6 SPINAL FUSION;  Surgeon: Melina Schools, MD;  Location: [place];  Service: Orthopedics;  Laterality: N/A;   Past Medical History  Diagnosis Date  . DVT (deep venous thrombosis)     ? vs lupus  . Fibroids   . Headache(784.0)   . Difficult intravenous access     hands are usually best for blood draws and IV starts  . Neck pain   . Arthritis   . Anemia     hx  . Complication of anesthesia     slow to wake up,   very difficult to get iv had picc line last time  . Scoliosis    Pulse 90  Resp 14  Ht 5\' 5"  (1.651 m)  Wt 262 lb 3.2 oz (  118.933 kg)  BMI 43.63 kg/m2  SpO2 100%  LMP 02/27/2011  Opioid Risk Score:   Fall Risk Score: Moderate Fall Risk (6-13 points)   Review of Systems     Objective:   Physical Exam  Nursing note and vitals reviewed. Constitutional: She is oriented to person, place, and time. She appears well-developed and well-nourished.  HENT:  Head: Normocephalic and atraumatic.  Neck: Normal range of motion. Neck supple.  Cardiovascular: Normal rate and regular rhythm.   Pulmonary/Chest: Effort normal and breath sounds normal.  Musculoskeletal: She exhibits edema.  Normal Muscle Bulk and Muscle testing Reveals: Upper Extremities: Full ROM and Muscle Strength 5/5 Lumbar Paraspinal Tenderness: L-3- L-5 Lower Extremities: Full ROM and Muscle Strength 5/5 Arises from chair with slight difficulty/ using a straight cane for support. Upon standing her right leg noted with  spasm Wide Based gait  Neurological: She is alert and oriented to person, place, and time.  Skin: Skin is warm and dry.  Psychiatric: She has a normal mood and affect.          Assessment & Plan:  1. 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 Status post decompressive laminectomy:  Refilled: Oxycodone 10mg /325 mg 1-2 tablets every 4 hours #60. MS Contin 30 mg one tablet every 12 hours as needed. #60.  2. Insomnia: On elavil 50 mg hs.  3. Neuropathic pain: Continue Gabapentin to 600mg  TID  4. Muscle Spasms. Continue with Robaxin for muscle spasms  5. Neurogenic bowel and bladder- still with constipation Linzess effective.   20 minutes of face to face patient care time was spent during this visit. All questions were encouraged and answered.  F/U in 1 month

## 2014-01-17 ENCOUNTER — Other Ambulatory Visit: Payer: Self-pay | Admitting: Physical Medicine & Rehabilitation

## 2014-01-22 ENCOUNTER — Encounter: Payer: Self-pay | Admitting: Registered Nurse

## 2014-02-05 ENCOUNTER — Encounter: Payer: Self-pay | Admitting: Physical Medicine & Rehabilitation

## 2014-02-05 ENCOUNTER — Encounter: Payer: Medicaid Other | Attending: Physical Medicine and Rehabilitation | Admitting: Physical Medicine & Rehabilitation

## 2014-02-05 VITALS — BP 112/71 | HR 77 | Resp 14 | Ht 65.0 in | Wt 260.0 lb

## 2014-02-05 DIAGNOSIS — M4806 Spinal stenosis, lumbar region: Secondary | ICD-10-CM | POA: Diagnosis present

## 2014-02-05 DIAGNOSIS — M5126 Other intervertebral disc displacement, lumbar region: Secondary | ICD-10-CM | POA: Diagnosis present

## 2014-02-05 DIAGNOSIS — G834 Cauda equina syndrome: Secondary | ICD-10-CM | POA: Diagnosis present

## 2014-02-05 DIAGNOSIS — M48061 Spinal stenosis, lumbar region without neurogenic claudication: Secondary | ICD-10-CM

## 2014-02-05 DIAGNOSIS — M7062 Trochanteric bursitis, left hip: Secondary | ICD-10-CM

## 2014-02-05 MED ORDER — MORPHINE SULFATE ER 30 MG PO TBCR: 30.0000 mg | EXTENDED_RELEASE_TABLET | Freq: Two times a day (BID) | ORAL | Status: DC

## 2014-02-05 MED ORDER — OXYCODONE-ACETAMINOPHEN 10-325 MG PO TABS: 1.0000 | ORAL_TABLET | ORAL | Status: DC | PRN

## 2014-02-05 MED ORDER — PROMETHAZINE HCL 12.5 MG PO TABS: 12.5000 mg | ORAL_TABLET | Freq: Four times a day (QID) | ORAL | Status: DC | PRN

## 2014-02-05 NOTE — Patient Instructions (Signed)
MAKE SURE YOU TAKE YOUR LINZESS EVERY DAY   MAKE SURE YOU TAKE YOUR MORPHINE WITH FOOD OR DRINK EACH TIME!   WATER WALKING

## 2014-02-05 NOTE — Progress Notes (Signed)
Subjective:    Patient ID: [name], female    DOB: 1970-04-17, 43 y.o.   MRN: [id number]  HPI   [name] is back regarding her cauda equina syndrome. She reports nausea and vomiting from the MS Contin---she is confident that this as well as potentially constipation. The oxycodone still makes her itch.   She takes linzess for her neurogenic bowel but doesn't take it on a regular basis.   She is still seeing rheumatology regarding her potential lupus syndrome.  The left hip injection we did at last visit seemed to help quite a bit with her pain levels down less than 50%.      Pain Inventory Average Pain 8 Pain Right Now 9 My pain is sharp, dull, stabbing, tingling and aching  In the last 24 hours, has pain interfered with the following? General activity 7 Relation with others 7 Enjoyment of life 7 What TIME of day is your pain at its worst? morning, daytime, evening, night Sleep (in general) Poor  Pain is worse with: walking, bending, sitting and standing Pain improves with: medication Relief from Meds: 5  Mobility walk without assistance use a cane ability to climb steps?  no do you drive?  no  Function not employed: date last employed 2013  Neuro/Psych tingling trouble walking spasms confusion  Prior Studies Any changes since last visit?  no  Physicians involved in your care Any changes since last visit?  no   Family History  Problem Relation Age of Onset  . Anesthesia problems Neg Hx   . Hypertension Maternal Grandmother    History   Social History  . Marital Status: Divorced    Spouse Name: N/A    Number of Children: N/A  . Years of Education: N/A   Social History Main Topics  . Smoking status: Never Smoker   . Smokeless tobacco: Never Used  . Alcohol Use: No  . Drug Use: No  . Sexual Activity: Not Currently    Birth Control/ Protection: None     Comment: IUD removed 2 wks ago.   Other Topics Concern  . None   Social History  Narrative   Past Surgical History  Procedure Laterality Date  . Neck surgery    . Cervical spine surgery  10  . Myomectomy  13  . Wisdom tooth extraction    . Cesarean section  1996  . Abdominal hysterectomy  06/29/11    Robotic Assisted Hysterectomy  . Lumbar laminectomy  08/31/2011    Procedure: MICRODISCECTOMY LUMBAR LAMINECTOMY;  Surgeon: Tobi Bastos, MD;  Location: [facility];  Service: Orthopedics;  Laterality: N/A;  lumbar five sacral one central microdisectomy  . Posterior fusion cervical spine  02/02/2013    C 5 C6     [name]  . Posterior cervical fusion/foraminotomy N/A 02/02/2013    Procedure: POSTERIOR C5-6 SPINAL FUSION;  Surgeon: Melina Schools, MD;  Location: [place];  Service: Orthopedics;  Laterality: N/A;   Past Medical History  Diagnosis Date  . DVT (deep venous thrombosis)     ? vs lupus  . Fibroids   . Headache(784.0)   . Difficult intravenous access     hands are usually best for blood draws and IV starts  . Neck pain   . Arthritis   . Anemia     hx  . Complication of anesthesia     slow to wake up,   very difficult to get iv had picc line last time  .  Scoliosis    BP 112/71 mmHg  Pulse 77  Resp 14  Ht 5\' 5"  (1.651 m)  Wt 260 lb (117.935 kg)  BMI 43.27 kg/m2  SpO2 98%  LMP 02/27/2011  Opioid Risk Score:   Fall Risk Score: Moderate Fall Risk (6-13 points) Review of Systems  Constitutional: Positive for unexpected weight change.       Night sweats, weight gain  Respiratory: Positive for cough.   Cardiovascular: Positive for leg swelling.  Gastrointestinal: Positive for nausea, vomiting and constipation.  Musculoskeletal: Positive for back pain.       Trouble walking  Neurological: Positive for syncope.       Tingling, spasms  All other systems reviewed and are negative.      Objective:   Physical Exam  General: Alert and oriented x 3. She remains obese.  HEENT: Head is normocephalic, atraumatic, PERRLA, EOMI, sclera anicteric, oral  mucosa pink and moist, dentition intact, ext ear canals clear,  Neck: Supple without JVD or lymphadenopathy  Heart: Reg rate and rhythm. No murmurs rubs or gallops  Chest: CTA bilaterally without wheezes, rales, or rhonchi; no distress  Abdomen: Soft, non-tender, non-distended, bowel sounds positive.  Extremities: No clubbing, cyanosis, or edema. Pulses are 2+  Skin: Clean and intact without signs of breakdown- woiund clean and intact.  Neuro: Pt is cognitively appropriate with normal insight, memory, and awareness. Cranial nerves 2-12 are intact. Sensory exam RLE is 1/2. Reflexes are 1+ in LE's. Fine motor coordination is intact. No tremors. Srtength in the Right HF, ADF, APF, KE, HAB, HE is grossly 4+/5. HAD and HF was 4+5/5. LLE grossly 5/5 with pain inhibiion proximally. Antalgic to the left is persistent. Musculoskeletal: lumbar flexion 40 degrees, ext 10 degrees, lat rotation 20 degrees, lat bending 10 degrees. . Posture is fair.  Low back tender. Gait is stable  Psych: Pt's affect is appropriate. Pt is cooperative    Assessment & Plan:   1. 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 Status post decompressive laminectomy  2. Maintain elavil 100mg  qhs  3. Pain Management: change oxcontin to ms contin 30mg  q 12 (for itching/rash)  -percocet was refilled---continue for now. Consider change to hydrocodone 4. Neuropathic pain.  gabapentin is 900 mg TID  5. Continue with water and HEP. Discussed good posture and core strengthening 6. Continue with robaxin for muscle spasms  7. No hip injections today. 8. ?lupus syndrome  9. Follow up in about 4 weeks. Thirty minutes of face to face patient care time were spent during this visit. All questions were encouraged and answered.

## 2014-03-05 ENCOUNTER — Encounter: Payer: Medicaid Other | Attending: Physical Medicine and Rehabilitation | Admitting: Registered Nurse

## 2014-03-05 ENCOUNTER — Encounter: Payer: Self-pay | Admitting: Registered Nurse

## 2014-03-05 VITALS — BP 121/77 | HR 88 | Resp 14 | Ht 65.0 in | Wt 260.0 lb

## 2014-03-05 DIAGNOSIS — G834 Cauda equina syndrome: Secondary | ICD-10-CM | POA: Insufficient documentation

## 2014-03-05 DIAGNOSIS — M4806 Spinal stenosis, lumbar region: Secondary | ICD-10-CM | POA: Insufficient documentation

## 2014-03-05 DIAGNOSIS — M7061 Trochanteric bursitis, right hip: Secondary | ICD-10-CM

## 2014-03-05 DIAGNOSIS — Z5181 Encounter for therapeutic drug level monitoring: Secondary | ICD-10-CM

## 2014-03-05 DIAGNOSIS — M5126 Other intervertebral disc displacement, lumbar region: Secondary | ICD-10-CM | POA: Diagnosis present

## 2014-03-05 DIAGNOSIS — M48061 Spinal stenosis, lumbar region without neurogenic claudication: Secondary | ICD-10-CM

## 2014-03-05 DIAGNOSIS — Z79899 Other long term (current) drug therapy: Secondary | ICD-10-CM

## 2014-03-05 MED ORDER — METHYLPREDNISOLONE 4 MG PO KIT: PACK | ORAL | Status: DC

## 2014-03-05 MED ORDER — TIZANIDINE HCL 4 MG PO TABS: 4.0000 mg | ORAL_TABLET | Freq: Three times a day (TID) | ORAL | Status: DC | PRN

## 2014-03-05 MED ORDER — OXYCODONE-ACETAMINOPHEN 10-325 MG PO TABS: 1.0000 | ORAL_TABLET | ORAL | Status: DC | PRN

## 2014-03-05 MED ORDER — MORPHINE SULFATE ER 30 MG PO TBCR: 30.0000 mg | EXTENDED_RELEASE_TABLET | Freq: Two times a day (BID) | ORAL | Status: DC

## 2014-03-05 NOTE — Progress Notes (Signed)
Subjective:    Patient ID: [name], female    DOB: 1970-11-28, 43 y.o.   MRN: [id number]  HPI: [name] is a 43 year old female who returns for follow up for chronic pain and medication refill. She says her pain is located in her lower back, right hip and bilateral lower extremities. She rates her pain 10. Her current exercise regime is going to [employer] for water aerobics three times a week.   She states" she fell after Thanksgiving walking down her porch steps her foot gave out and she lost her footing and landed on her buttocks, during this time she admits to using her oxycodone as prescribed 4 times a day.  She admits to increase intensity of pain in relation to weather changes and noticed increased frequency of muscle spasms noted. Will hold Robaxin and Prescribed Tizanidine she verbalizes understanding. She was instructed to call the office on 03/07/14 she verbalizes understanding. Pain Inventory Average Pain 8 Pain Right Now 10 My pain is sharp, stabbing, tingling and aching  In the last 24 hours, has pain interfered with the following? General activity 8 Relation with others 10 Enjoyment of life 10 What TIME of day is your pain at its worst? all Sleep (in general) Poor  Pain is worse with: walking, bending, sitting, standing and some activites Pain improves with: medication Relief from Meds: 5  Mobility walk with assistance use a cane ability to climb steps?  no do you drive?  no  Function not employed: date last employed . I need assistance with the following:  dressing, bathing, meal prep, household duties and shopping  Neuro/Psych numbness trouble walking spasms  Prior Studies Any changes since last visit?  no  Physicians involved in your care Any changes since last visit?  no   Family History  Problem Relation Age of Onset  . Anesthesia problems Neg Hx   . Hypertension Maternal Grandmother    History   Social History  .  Marital Status: Divorced    Spouse Name: N/A    Number of Children: N/A  . Years of Education: N/A   Social History Main Topics  . Smoking status: Never Smoker   . Smokeless tobacco: Never Used  . Alcohol Use: No  . Drug Use: No  . Sexual Activity: Not Currently    Birth Control/ Protection: None     Comment: IUD removed 2 wks ago.   Other Topics Concern  . None   Social History Narrative   Past Surgical History  Procedure Laterality Date  . Neck surgery    . Cervical spine surgery  10  . Myomectomy  13  . Wisdom tooth extraction    . Cesarean section  1996  . Abdominal hysterectomy  06/29/11    Robotic Assisted Hysterectomy  . Lumbar laminectomy  08/31/2011    Procedure: MICRODISCECTOMY LUMBAR LAMINECTOMY;  Surgeon: Tobi Bastos, MD;  Location: [facility];  Service: Orthopedics;  Laterality: N/A;  lumbar five sacral one central microdisectomy  . Posterior fusion cervical spine  02/02/2013    C 5 C6     [name]  . Posterior cervical fusion/foraminotomy N/A 02/02/2013    Procedure: POSTERIOR C5-6 SPINAL FUSION;  Surgeon: Melina Schools, MD;  Location: [place];  Service: Orthopedics;  Laterality: N/A;   Past Medical History  Diagnosis Date  . DVT (deep venous thrombosis)     ? vs lupus  . Fibroids   . Headache(784.0)   .  Difficult intravenous access     hands are usually best for blood draws and IV starts  . Neck pain   . Arthritis   . Anemia     hx  . Complication of anesthesia     slow to wake up,   very difficult to get iv had picc line last time  . Scoliosis    BP 121/77 mmHg  Pulse 88  Resp 14  Ht 5\' 5"  (1.651 m)  Wt 260 lb (117.935 kg)  BMI 43.27 kg/m2  SpO2 97%  LMP 02/27/2011  Opioid Risk Score:   Fall Risk Score: Moderate Fall Risk (6-13 points) (pt has rec'd pamphlet during previous visit) Review of Systems  Constitutional:       Night sweats  weight gain   Cardiovascular: Positive for leg swelling.  Gastrointestinal: Positive for nausea,  vomiting and constipation.  Musculoskeletal: Positive for gait problem.  Neurological: Positive for numbness.       Spasms   All other systems reviewed and are negative.      Objective:   Physical Exam  Constitutional: She is oriented to person, place, and time. She appears well-developed and well-nourished.  HENT:  Head: Normocephalic and atraumatic.  Neck: Normal range of motion. Neck supple.  Cardiovascular: Normal rate and regular rhythm.   Pulmonary/Chest: Effort normal and breath sounds normal.  Musculoskeletal:  Normal Muscle Bulk and Muscle testing Reveals: Upper extremities: Full ROM and Muscle Strength 5/5 Lumbar Paraspinal Tenderness: L-3- L-5 Right Greater Trochanteric Tenderness Lower Extremities: Full ROM and Muscle strength 5/5 Right Leg Flexion Produces pain into Right Lower extremity Left Leg Flexion Produces pain into Left Hip Arises from chair slowly Antalgic gait   Neurological: She is alert and oriented to person, place, and time.  Skin: Skin is warm and dry.  Psychiatric: She has a normal mood and affect.  Nursing note and vitals reviewed.         Assessment & Plan:  1. 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 Status post decompressive laminectomy:  Refilled: Oxycodone 10mg /325 mg 1-2 tablets every 4 hours, Increased to #70. MS Contin 30 mg one tablet every 12 hours as needed. #60.  2. Insomnia: On elavil 50 mg hs.  3. Neuropathic pain: Continue Gabapentin to 600mg  TID  4. Muscle Spasms. RX: Tizanidine, hold Robaxin for muscle spasms  5. Neurogenic bowel and bladder- still with constipation Linzess effective.  6. Right Greater Trochanteric tenderness: RX: Medrol Dose Pak  20 minutes of face to face patient care time was spent during this visit. All questions were encouraged and answered.  F/U in 1 month

## 2014-03-05 NOTE — Patient Instructions (Addendum)
Do not take Robaxin while Trying Tizanidine.  Call the office on Wednesday: 03/07/14 regarding the Muscle spasm [phone]

## 2014-04-02 ENCOUNTER — Encounter: Payer: Self-pay | Admitting: Registered Nurse

## 2014-04-02 ENCOUNTER — Other Ambulatory Visit: Payer: Self-pay | Admitting: Physical Medicine & Rehabilitation

## 2014-04-02 ENCOUNTER — Encounter: Payer: Self-pay | Attending: Physical Medicine and Rehabilitation | Admitting: Registered Nurse

## 2014-04-02 VITALS — BP 129/76 | HR 87 | Resp 14

## 2014-04-02 DIAGNOSIS — Z5181 Encounter for therapeutic drug level monitoring: Secondary | ICD-10-CM

## 2014-04-02 DIAGNOSIS — M7061 Trochanteric bursitis, right hip: Secondary | ICD-10-CM

## 2014-04-02 DIAGNOSIS — M48061 Spinal stenosis, lumbar region without neurogenic claudication: Secondary | ICD-10-CM

## 2014-04-02 DIAGNOSIS — M4806 Spinal stenosis, lumbar region: Secondary | ICD-10-CM | POA: Insufficient documentation

## 2014-04-02 DIAGNOSIS — G834 Cauda equina syndrome: Secondary | ICD-10-CM | POA: Insufficient documentation

## 2014-04-02 DIAGNOSIS — M5126 Other intervertebral disc displacement, lumbar region: Secondary | ICD-10-CM | POA: Insufficient documentation

## 2014-04-02 DIAGNOSIS — Z79899 Other long term (current) drug therapy: Secondary | ICD-10-CM

## 2014-04-02 DIAGNOSIS — M7062 Trochanteric bursitis, left hip: Secondary | ICD-10-CM

## 2014-04-02 MED ORDER — OXYCODONE-ACETAMINOPHEN 10-325 MG PO TABS: 1.0000 | ORAL_TABLET | ORAL | Status: DC | PRN

## 2014-04-02 MED ORDER — MORPHINE SULFATE ER 30 MG PO TBCR: 30.0000 mg | EXTENDED_RELEASE_TABLET | Freq: Two times a day (BID) | ORAL | Status: DC

## 2014-04-02 NOTE — Progress Notes (Signed)
Subjective:    Patient ID: [name], female    DOB: 26-Jan-1971, 44 y.o.   MRN: [id number]  HPI: [name] is a 44 year old female who returns for follow up for chronic pain and medication refill. [name] says her pain is located in her lower back. [name] rates her pain 9. [name] hasn't followed her usual current exercise regime due to illness. For the last 7 days [name] has been nauseated and vomiting, unable to hold her medications down.  Also with a fever. Today is the first day [name] hasn't vomited or had diarrhea, encouraged to eat a bland diet. [name] verbalizes understanding. Son in the room he's home from college and states he has been taken care of his mother. [name] also states Pharmacy lost her MS Contin prescription, was due to be filled on 03/25/14.  Pain Inventory Average Pain 9 Pain Right Now 9 My pain is burning, stabbing, tingling and aching  In the last 24 hours, has pain interfered with the following? General activity 9 Relation with others 9 Enjoyment of life 10 What TIME of day is your pain at its worst? all Sleep (in general) Fair  Pain is worse with: walking, bending, sitting and standing Pain improves with: medication Relief from Meds: 5  Mobility use a cane ability to climb steps?  no do you drive?  no  Function not employed: date last employed 2013 I need assistance with the following:  dressing, bathing, meal prep, household duties and shopping  Neuro/Psych weakness numbness tremor tingling trouble walking spasms  Prior Studies Any changes since last visit?  no  Physicians involved in your care Any changes since last visit?  no   Family History  Problem Relation Age of Onset  . Anesthesia problems Neg Hx   . Hypertension Maternal Grandmother    History   Social History  . Marital Status: Divorced    Spouse Name: N/A    Number of Children: N/A  . Years of Education: N/A   Social History Main Topics  . Smoking status: Never Smoker   .  Smokeless tobacco: Never Used  . Alcohol Use: No  . Drug Use: No  . Sexual Activity: Not Currently    Birth Control/ Protection: None     Comment: IUD removed 2 wks ago.   Other Topics Concern  . None   Social History Narrative   Past Surgical History  Procedure Laterality Date  . Neck surgery    . Cervical spine surgery  10  . Myomectomy  13  . Wisdom tooth extraction    . Cesarean section  1996  . Abdominal hysterectomy  06/29/11    Robotic Assisted Hysterectomy  . Lumbar laminectomy  08/31/2011    Procedure: MICRODISCECTOMY LUMBAR LAMINECTOMY;  Surgeon: Tobi Bastos, MD;  Location: [facility];  Service: Orthopedics;  Laterality: N/A;  lumbar five sacral one central microdisectomy  . Posterior fusion cervical spine  02/02/2013    C 5 C6     [name]  . Posterior cervical fusion/foraminotomy N/A 02/02/2013    Procedure: POSTERIOR C5-6 SPINAL FUSION;  Surgeon: Melina Schools, MD;  Location: [place];  Service: Orthopedics;  Laterality: N/A;   Past Medical History  Diagnosis Date  . DVT (deep venous thrombosis)     ? vs lupus  . Fibroids   . Headache(784.0)   . Difficult intravenous access     hands are usually best for blood draws and IV starts  .  Neck pain   . Arthritis   . Anemia     hx  . Complication of anesthesia     slow to wake up,   very difficult to get iv had picc line last time  . Scoliosis    BP 129/76 mmHg  Pulse 87  Resp 14  SpO2 100%  LMP 02/27/2011  Opioid Risk Score:   Fall Risk Score: Moderate Fall Risk (6-13 points) (previously educated and given handout)  Review of Systems  Constitutional: Positive for chills, diaphoresis and unexpected weight change.  Respiratory: Positive for cough.   Cardiovascular: Positive for leg swelling.  Gastrointestinal: Positive for nausea, vomiting, diarrhea and constipation.  Musculoskeletal: Positive for back pain and gait problem.       Spasms  Neurological: Positive for tremors, weakness and numbness.        Tingling  All other systems reviewed and are negative.      Objective:   Physical Exam  Constitutional: [name] is oriented to person, place, and time. [name] appears well-developed and well-nourished.  HENT:  Head: Normocephalic and atraumatic.  Neck: Normal range of motion. Neck supple.  Cardiovascular: Normal rate and regular rhythm.   Pulmonary/Chest: Effort normal and breath sounds normal.  Musculoskeletal:  Normal Muscle Bulk and Muscle testing Reveals: Upper extremities: Full ROM and Muscle Strength 5/5 Lumbar Paraspinal Tenderness: L-3- L-5 Bilateral Greater Trochanteric tenderness Lower extremities: Bilateral Lower extremities Flexion Produces pain into Lumbar and Hips Arises from chair with slight difficulty/ Upon standing spasm occurs Antalgic Gait   Neurological: [name] is alert and oriented to person, place, and time.  Skin: Skin is warm and dry.  Psychiatric: [name] has a normal mood and affect.  Nursing note and vitals reviewed.         Assessment & Plan:  1. 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 Status post decompressive laminectomy:  Refilled: Oxycodone 10mg /325 mg 1-2 tablets every 4 hours,  to #70. MS Contin 30 mg one tablet every 12 hours as needed. #60.  2. Insomnia: On elavil 50 mg hs.  3. Neuropathic pain: Continue Gabapentin to 600mg  TID  4. Muscle Spasms. Continue: Tizanidine, for muscle spasms  5. Neurogenic bowel and bladder- Continue Linzess effective.  6. Bilateral Greater Trochanteric tenderness: Schedule for Cortisone injection with Dr. Naaman Plummer  20 minutes of face to face patient care time was spent during this visit. All questions were encouraged and answered.  F/U in 1 month

## 2014-04-03 LAB — PMP ALCOHOL METABOLITE (ETG): ETGU: NEGATIVE ng/mL

## 2014-04-06 LAB — OPIATES/OPIOIDS (LC/MS-MS)
Codeine Urine: NEGATIVE ng/mL (ref <50)
HYDROMORPHONE: NEGATIVE ng/mL — AB (ref <50)
Hydrocodone: NEGATIVE ng/mL (ref <50)
Morphine Urine: NEGATIVE ng/mL — AB (ref <50)
NORHYDROCODONE, UR: NEGATIVE ng/mL (ref <50)
NOROXYCODONE, UR: 7961 ng/mL (ref <50)
OXYMORPHONE, URINE: 9812 ng/mL (ref <50)
Oxycodone, ur: 6716 ng/mL (ref <50)

## 2014-04-06 LAB — OXYCODONE, URINE (LC/MS-MS)
NOROXYCODONE, UR: 7961 ng/mL (ref <50)
OXYMORPHONE, URINE: 9812 ng/mL (ref <50)
Oxycodone, ur: 6716 ng/mL (ref <50)

## 2014-04-07 LAB — PRESCRIPTION MONITORING PROFILE (SOLSTAS)
Amphetamine/Meth: NEGATIVE ng/mL
BENZODIAZEPINE SCREEN, URINE: NEGATIVE ng/mL
Barbiturate Screen, Urine: NEGATIVE ng/mL
Buprenorphine, Urine: NEGATIVE ng/mL
CARISOPRODOL, URINE: NEGATIVE ng/mL
Cannabinoid Scrn, Ur: NEGATIVE ng/mL
Cocaine Metabolites: NEGATIVE ng/mL
Creatinine, Urine: 615.36 mg/dL (ref >20.0)
ECSTASY: NEGATIVE ng/mL
FENTANYL URINE: NEGATIVE ng/mL
Meperidine, Ur: NEGATIVE ng/mL
Methadone Screen, Urine: NEGATIVE ng/mL
Nitrites, Initial: NEGATIVE ug/mL
PH URINE, INITIAL: 6 pH (ref 4.5–8.9)
Propoxyphene: NEGATIVE ng/mL
TAPENTADOLUR: NEGATIVE ng/mL
Tramadol Scrn, Ur: NEGATIVE ng/mL
ZOLPIDEM, URINE: NEGATIVE ng/mL

## 2014-04-13 ENCOUNTER — Telehealth: Payer: Self-pay | Admitting: Physical Medicine & Rehabilitation

## 2014-04-13 NOTE — Telephone Encounter (Signed)
Where is morphine?

## 2014-04-16 NOTE — Telephone Encounter (Signed)
She had been out of her morphine for around 2 weeks.  Pharmacy had misplaced her rx (excerpt taken from 04/02/14 visit: ms contin 30 mg last dose: ~12/31 (pharm lost her rx) percocet 10/325 last dose: 04/02/14

## 2014-04-26 NOTE — Progress Notes (Signed)
Urine drug screen for this encounter is consistent for prescribed medication 

## 2014-05-01 ENCOUNTER — Encounter: Payer: Self-pay | Attending: Physical Medicine and Rehabilitation | Admitting: Physical Medicine & Rehabilitation

## 2014-05-01 ENCOUNTER — Encounter: Payer: Self-pay | Admitting: Physical Medicine & Rehabilitation

## 2014-05-01 VITALS — BP 134/67 | HR 75 | Resp 14

## 2014-05-01 DIAGNOSIS — G834 Cauda equina syndrome: Secondary | ICD-10-CM | POA: Insufficient documentation

## 2014-05-01 DIAGNOSIS — M4806 Spinal stenosis, lumbar region: Secondary | ICD-10-CM | POA: Insufficient documentation

## 2014-05-01 DIAGNOSIS — M7062 Trochanteric bursitis, left hip: Secondary | ICD-10-CM

## 2014-05-01 DIAGNOSIS — M5126 Other intervertebral disc displacement, lumbar region: Secondary | ICD-10-CM | POA: Insufficient documentation

## 2014-05-01 DIAGNOSIS — M48061 Spinal stenosis, lumbar region without neurogenic claudication: Secondary | ICD-10-CM

## 2014-05-01 MED ORDER — AMITRIPTYLINE HCL 100 MG PO TABS: 100.0000 mg | ORAL_TABLET | Freq: Every day | ORAL | Status: DC

## 2014-05-01 MED ORDER — GABAPENTIN 300 MG PO CAPS: 300.0000 mg | ORAL_CAPSULE | Freq: Three times a day (TID) | ORAL | Status: DC

## 2014-05-01 MED ORDER — OXYCODONE-ACETAMINOPHEN 5-325 MG PO TABS: 1.0000 | ORAL_TABLET | Freq: Three times a day (TID) | ORAL | Status: DC | PRN

## 2014-05-01 NOTE — Patient Instructions (Signed)
CONTACT THE [facility] ABOUT CHANGING YOUR STATUS

## 2014-05-01 NOTE — Progress Notes (Signed)
Subjective:    Patient ID: [name], female    DOB: [birth year], 44 y.o.   MRN: [id number]  HPI   [name] is back regarding her cauda equina syndrome. She hasn't had any pain medications since January when she lost her MCD (son turned 20 yo). She hasn't been out of the house for the last month. [name] is taking a lot alleve at home----she is taking anywhere from 4-6 per day. She is not using tylenol, ice. Her heating pad helps.   She is using a cane for balance. She remains independent with basic self-care and mobility.      Pain Inventory Average Pain 10 Pain Right Now 10 My pain is constant  In the last 24 hours, has pain interfered with the following? General activity 10 Relation with others 10 Enjoyment of life 10 What TIME of day is your pain at its worst? all Sleep (in general) Poor  Pain is worse with: walking, bending, sitting and standing Pain improves with: na Relief from Meds: not taking anything currently  Mobility walk without assistance walk with assistance use a cane use a walker ability to climb steps?  no do you drive?  no  Function not employed: date last employed . I need assistance with the following:  dressing, bathing, meal prep, household duties and shopping  Neuro/Psych weakness numbness tingling trouble walking spasms  Prior Studies Any changes since last visit?  no  Physicians involved in your care Any changes since last visit?  no   Family History  Problem Relation Age of Onset  . Anesthesia problems Neg Hx   . Hypertension Maternal Grandmother    History   Social History  . Marital Status: Divorced    Spouse Name: N/A    Number of Children: N/A  . Years of Education: N/A   Social History Main Topics  . Smoking status: Never Smoker   . Smokeless tobacco: Never Used  . Alcohol Use: No  . Drug Use: No  . Sexual Activity: Not Currently    Birth Control/ Protection: None     Comment: IUD removed 2 wks ago.    Other Topics Concern  . None   Social History Narrative   Past Surgical History  Procedure Laterality Date  . Neck surgery    . Cervical spine surgery  10  . Myomectomy  13  . Wisdom tooth extraction    . Cesarean section  1996  . Abdominal hysterectomy  06/29/11    Robotic Assisted Hysterectomy  . Lumbar laminectomy  08/31/2011    Procedure: MICRODISCECTOMY LUMBAR LAMINECTOMY;  Surgeon: Tobi Bastos, MD;  Location: [facility];  Service: Orthopedics;  Laterality: N/A;  lumbar five sacral one central microdisectomy  . Posterior fusion cervical spine  02/02/2013    C 5 C6     [name]  . Posterior cervical fusion/foraminotomy N/A 02/02/2013    Procedure: POSTERIOR C5-6 SPINAL FUSION;  Surgeon: Melina Schools, MD;  Location: [facility];  Service: Orthopedics;  Laterality: N/A;   Past Medical History  Diagnosis Date  . DVT (deep venous thrombosis)     ? vs lupus  . Fibroids   . Headache(784.0)   . Difficult intravenous access     hands are usually best for blood draws and IV starts  . Neck pain   . Arthritis   . Anemia     hx  . Complication of anesthesia     slow to wake up,   very difficult  to get iv had picc line last time  . Scoliosis    BP 134/67 mmHg  Pulse 75  Resp 14  SpO2 96%  LMP 02/27/2011  Opioid Risk Score:   Fall Risk Score: Low Fall Risk (0-5 points) (previously educated and given handout)  Review of Systems  Musculoskeletal: Positive for gait problem.       Spasms  Neurological: Positive for weakness and numbness.       Tingling  All other systems reviewed and are negative.      Objective:   Physical Exam  General: Alert and oriented x 3. She remains obese.  HEENT: Head is normocephalic, atraumatic, PERRLA, EOMI, sclera anicteric, oral mucosa pink and moist, dentition intact, ext ear canals clear,  Neck: Supple without JVD or lymphadenopathy  Heart: Reg rate and rhythm. No murmurs rubs or gallops  Chest: CTA bilaterally without wheezes, rales, or  rhonchi; no distress  Abdomen: Soft, non-tender, non-distended, bowel sounds positive.  Extremities: No clubbing, cyanosis, or edema. Pulses are 2+  Skin: Clean and intact without signs of breakdown- woiund clean and intact.  Neuro: Pt is cognitively appropriate with normal insight, memory, and awareness. Cranial nerves 2-12 are intact. Sensory exam RLE is 1/2. Reflexes are 1+ in LE's. Fine motor coordination is intact. No tremors. Srtength in the Right HF, ADF, APF, KE, HAB, HE is grossly 4+/5. HAD and HF was 4+5/5. LLE grossly 5/5 with pain inhibiion proximally. Antalgic to the left is persistent but better.  Musculoskeletal: lumbar flexion 40 degrees, ext 10 degrees, lat rotation 20 degrees, lat bending 10 degrees. . Posture is fair. Low back tender. Gait is stable/shifts weight fairly well Psych: Pt's affect is appropriate. Pt is cooperative   Assessment & Plan:   1. 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 Status post decompressive laminectomy  2. Maintain elavil 100mg  qhs ---refilled 3. Pain Management: will write her percocet 5/325 to help with immediate pain. One q8 prn #75 4. Neuropathic pain. gabapentin was 900 mg TID---will write now for 300mg  TID and see if she can get it filled.  5. Continue with  HEP. Discussed good posture and core strengthening  6. Continue with robaxin for muscle spasms  8. ?lupus syndrome  9. Follow up in about 4 weeks. Thirty minutes of face to face patient care time were spent during this visit. All questions were encouraged and answered. Asked the patient to check regarding her orange card and have the provider changed.

## 2014-05-22 ENCOUNTER — Ambulatory Visit (INDEPENDENT_AMBULATORY_CARE_PROVIDER_SITE_OTHER): Payer: Self-pay | Admitting: Family Medicine

## 2014-05-22 VITALS — BP 124/72 | HR 80 | Temp 98.2°F | Resp 16 | Ht 65.0 in | Wt 252.0 lb

## 2014-05-22 DIAGNOSIS — G834 Cauda equina syndrome: Secondary | ICD-10-CM

## 2014-05-22 DIAGNOSIS — E559 Vitamin D deficiency, unspecified: Secondary | ICD-10-CM

## 2014-05-22 DIAGNOSIS — Z1231 Encounter for screening mammogram for malignant neoplasm of breast: Secondary | ICD-10-CM

## 2014-05-22 DIAGNOSIS — G894 Chronic pain syndrome: Secondary | ICD-10-CM

## 2014-05-22 DIAGNOSIS — E8881 Metabolic syndrome: Secondary | ICD-10-CM

## 2014-05-22 DIAGNOSIS — M48061 Spinal stenosis, lumbar region without neurogenic claudication: Secondary | ICD-10-CM

## 2014-05-22 DIAGNOSIS — J309 Allergic rhinitis, unspecified: Secondary | ICD-10-CM

## 2014-05-22 DIAGNOSIS — M4806 Spinal stenosis, lumbar region: Secondary | ICD-10-CM

## 2014-05-22 DIAGNOSIS — K5909 Other constipation: Secondary | ICD-10-CM

## 2014-05-22 MED ORDER — CETIRIZINE HCL 10 MG PO TABS: 10.0000 mg | ORAL_TABLET | Freq: Every day | ORAL | Status: DC

## 2014-05-22 MED ORDER — LINACLOTIDE 290 MCG PO CAPS: 290.0000 ug | ORAL_CAPSULE | Freq: Every day | ORAL | Status: DC

## 2014-05-22 NOTE — Progress Notes (Signed)
Subjective:    Patient ID: [name], female    DOB: [birth year], 44 y.o.   MRN: [id number]  HPI  [name] presents to establish care. She reports that she was previously a patient of Dr. Willey Blade, but had to discontinue care due to insurance constraints.   Patient complains of chronic low back pain. On reviewing the patient's chart, patient has had several surgeries that have resulted in chronic pain and decreased movement and functionality. [name] was followed by Dr. Naaman Plummer, at [facility]. She states that she recently lost her health insurance and has been unable to receive pain medications. Her current pain intensity is 6/10 to lower back described as constant and throbbing. She state that she has been taking Ibuprofen with minimal relief, last taken on 05/21/2014.   [name] is also complaining of seasonal allergic rhinitis. Patient's symptoms include clear rhinorrhea, itchy eyes, itchy nose, nasal congestion and swelling of eyes. These symptoms are seasonal. Current triggers include exposure to pollens, dust, mold and weather changes. The patient has been suffering from these symptoms for a number of years.    Patient also complains of constipation.  Stool pattern has been 2 firm stool(s) weekly. Onset was several years ago Defecation has been difficult. Co-Morbid conditions include chronic pain with use of opiates. She states that constipation is typically relieved by Linzess, but she has been out of medication for several weeks. She currently denies abdominal pain, bloody stools, nausea, vomiting, or diarrhea.  Past Medical History  Diagnosis Date  . DVT (deep venous thrombosis)     ? vs lupus  . Fibroids   . Headache(784.0)   . Difficult intravenous access     hands are usually best for blood draws and IV starts  . Neck pain   . Arthritis   . Anemia     hx  . Complication of anesthesia     slow to wake up,   very  difficult to get iv had picc line last time  . Scoliosis    History   Social History  . Marital Status: Divorced    Spouse Name: N/A  . Number of Children: N/A  . Years of Education: N/A   Social History Main Topics  . Smoking status: Never Smoker   . Smokeless tobacco: Never Used  . Alcohol Use: No  . Drug Use: No  . Sexual Activity: Not Currently    Birth Control/ Protection: None     Comment: IUD removed 2 wks ago.   Other Topics Concern  . Not on file   Social History Narrative   Review of Systems  Constitutional: Negative.   HENT: Positive for sinus pressure and sore throat. Negative for postnasal drip, trouble swallowing and voice change.   Eyes: Negative.  Negative for photophobia and visual disturbance.  Respiratory: Negative.  Negative for shortness of breath, wheezing and stridor.   Cardiovascular: Negative.   Gastrointestinal: Negative.   Endocrine: Negative.   Genitourinary: Negative.   Musculoskeletal: Positive for myalgias and back pain.  Skin: Negative.   Allergic/Immunologic: Positive for environmental allergies.  Neurological: Negative.   Hematological: Negative.   Psychiatric/Behavioral: Negative.  Negative for suicidal ideas and sleep disturbance.       Objective:   Physical Exam  Constitutional: She is oriented to person, place, and time. She appears well-developed and well-nourished.  HENT:  Head: Normocephalic and atraumatic.  Right Ear: External ear normal.  Left Ear: External  ear normal.  Mouth/Throat: Oropharynx is clear and moist.  Eyes: Conjunctivae, EOM and lids are normal. Pupils are equal, round, and reactive to light. Lids are everted and swept, no foreign bodies found. Right eye exhibits no discharge. Left eye exhibits no discharge. No scleral icterus.  Neck: Normal range of motion. Neck supple.  Cardiovascular: Normal rate, normal heart sounds and intact distal pulses.   Pulmonary/Chest: Effort normal and breath sounds normal.   Abdominal: Soft. Bowel sounds are normal.  Musculoskeletal:       Thoracic back: She exhibits decreased range of motion. She exhibits no bony tenderness.       Lumbar back: She exhibits decreased range of motion and pain. She exhibits no swelling, no edema, no spasm and normal pulse.  Ambulating with cane for support.   Neurological: She is alert and oriented to person, place, and time. She has normal reflexes.  Skin: Skin is warm and dry.  Psychiatric: She has a normal mood and affect. Her behavior is normal. Judgment and thought content normal.         BP 124/72 mmHg  Pulse 80  Temp(Src) 98.2 F (36.8 C) (Oral)  Resp 16  Ht 5\' 5"  (1.651 m)  Wt 252 lb (114.306 kg)  BMI 41.93 kg/m2  LMP 02/27/2011 Assessment & Plan:  1.  Cauda equina syndrome  Patient has been under the care of pain management for chronic back pain. On chart review, patient has had multiple back surgeries over the past 4-5 years. She states that she has been out of pain medications for greater than 1 month due to cost constraints. Her current pain intensity is 6-7/10. She states that she has been taking Ibuprofen without relief. Will send a referral to pain management for further evaluation.   Ambulatory referral to Pain Clinic  2. Spinal stenosis of lumbar region - Ambulatory referral to Pain Clinic  3.  Allergic rhinitis, unspecified allergic rhinitis type - cetirizine (ZYRTEC) 10 MG tablet; Take 1 tablet (10 mg total) by mouth daily.  Dispense: 30 tablet; Refill: 3  4. Other constipation [name] was previously under the care of Dr. Collene Mares, gastroenterologist for chronic constipation. She has been unable to follow-up due to insurance constraints. She reports that constipation improved on Linzess.   - Linaclotide (LINZESS) 290 MCG CAPS capsule; Take 1 capsule (290 mcg total) by mouth daily.  Dispense: 30 capsule; Refill: 10  5. Metabolic syndrome [name] has risk factors for metabolic syndrome  including obesity, history of hypertension, and a family history of hypertension and diabetes.   - CBC with Differential - COMPLETE METABOLIC PANEL WITH GFR - Lipid Panel - TSH  6. Vitamin D deficiency [name] reports a history of a vitamin D deficiency. I will check vitamin D level, she is currently not taking an OTC supplement.  - Vitamin D, 25-hydroxy  7. Chronic pain syndrome - Ambulatory referral to Pain Clinic   8. Encounter for mammogram to establish baseline mammogram Ms. Penniman is 26 and has never had a mammogram. She does not perform monthly self-breast exams. I will send for mammogram.  - MM Digital Screening; Future  Preventative care:  Hysterectomy in 2013 Up to date with vaccinations   RTC: 3 months for CPE with Dr. Lawson Fiscal, FNP

## 2014-05-24 ENCOUNTER — Encounter: Payer: Self-pay | Admitting: Family Medicine

## 2014-05-24 DIAGNOSIS — K5909 Other constipation: Secondary | ICD-10-CM | POA: Insufficient documentation

## 2014-05-25 ENCOUNTER — Other Ambulatory Visit: Payer: Self-pay | Admitting: Family Medicine

## 2014-05-25 DIAGNOSIS — Z1231 Encounter for screening mammogram for malignant neoplasm of breast: Secondary | ICD-10-CM

## 2014-05-28 ENCOUNTER — Other Ambulatory Visit: Payer: Self-pay

## 2014-05-28 LAB — CBC WITH DIFFERENTIAL/PLATELET
Basophils Absolute: 0 10*3/uL (ref 0.0–0.1)
Basophils Relative: 0 % (ref 0–1)
EOS PCT: 1 % (ref 0–5)
Eosinophils Absolute: 0.1 10*3/uL (ref 0.0–0.7)
HCT: 33.2 % — ABNORMAL LOW (ref 36.0–46.0)
Hemoglobin: 10.7 g/dL — ABNORMAL LOW (ref 12.0–15.0)
Lymphocytes Relative: 28 % (ref 12–46)
Lymphs Abs: 1.9 10*3/uL (ref 0.7–4.0)
MCH: 27.4 pg (ref 26.0–34.0)
MCHC: 32.2 g/dL (ref 30.0–36.0)
MCV: 84.9 fL (ref 78.0–100.0)
MPV: 11.9 fL (ref 8.6–12.4)
Monocytes Absolute: 0.6 10*3/uL (ref 0.1–1.0)
Monocytes Relative: 9 % (ref 3–12)
NEUTROS ABS: 4.2 10*3/uL (ref 1.7–7.7)
NEUTROS PCT: 62 % (ref 43–77)
PLATELETS: 262 10*3/uL (ref 150–400)
RBC: 3.91 MIL/uL (ref 3.87–5.11)
RDW: 15.5 % (ref 11.5–15.5)
WBC: 6.8 10*3/uL (ref 4.0–10.5)

## 2014-05-28 LAB — COMPLETE METABOLIC PANEL WITH GFR
ALT: 8 U/L (ref 0–35)
AST: 13 U/L (ref 0–37)
Albumin: 3.7 g/dL (ref 3.5–5.2)
Alkaline Phosphatase: 71 U/L (ref 39–117)
BUN: 12 mg/dL (ref 6–23)
CALCIUM: 8.2 mg/dL — AB (ref 8.4–10.5)
CHLORIDE: 103 meq/L (ref 96–112)
CO2: 25 meq/L (ref 19–32)
Creat: 0.71 mg/dL (ref 0.50–1.10)
GFR, Est Non African American: >89 mL/min
Glucose, Bld: 80 mg/dL (ref 70–99)
POTASSIUM: 3.7 meq/L (ref 3.5–5.3)
SODIUM: 136 meq/L (ref 135–145)
TOTAL PROTEIN: 7.3 g/dL (ref 6.0–8.3)
Total Bilirubin: 0.4 mg/dL (ref 0.2–1.2)

## 2014-05-28 LAB — LIPID PANEL
CHOL/HDL RATIO: 2.2 ratio
CHOLESTEROL: 129 mg/dL (ref 0–200)
HDL: 58 mg/dL (ref >=46)
LDL Cholesterol: 63 mg/dL (ref 0–99)
Triglycerides: 40 mg/dL (ref <150)
VLDL: 8 mg/dL (ref 0–40)

## 2014-05-29 ENCOUNTER — Ambulatory Visit
Admission: RE | Admit: 2014-05-29 | Discharge: 2014-05-29 | Disposition: A | Discharge status: Home / Self Care | Payer: No Typology Code available for payment source | Source: Ambulatory Visit | Attending: Family Medicine | Admitting: Family Medicine

## 2014-05-29 DIAGNOSIS — Z1231 Encounter for screening mammogram for malignant neoplasm of breast: Secondary | ICD-10-CM

## 2014-05-29 LAB — TSH: TSH: 1.225 u[IU]/mL (ref 0.350–4.500)

## 2014-05-29 LAB — VITAMIN D 25 HYDROXY (VIT D DEFICIENCY, FRACTURES): Vit D, 25-Hydroxy: 8 ng/mL — ABNORMAL LOW (ref 30–100)

## 2014-05-30 ENCOUNTER — Encounter: Payer: Self-pay | Admitting: Registered Nurse

## 2014-05-30 ENCOUNTER — Encounter: Payer: Self-pay | Attending: Physical Medicine and Rehabilitation | Admitting: Registered Nurse

## 2014-05-30 ENCOUNTER — Telehealth: Payer: Self-pay | Admitting: Family Medicine

## 2014-05-30 VITALS — BP 116/72 | HR 87 | Resp 14

## 2014-05-30 DIAGNOSIS — G894 Chronic pain syndrome: Secondary | ICD-10-CM

## 2014-05-30 DIAGNOSIS — M5126 Other intervertebral disc displacement, lumbar region: Secondary | ICD-10-CM | POA: Insufficient documentation

## 2014-05-30 DIAGNOSIS — M961 Postlaminectomy syndrome, not elsewhere classified: Secondary | ICD-10-CM

## 2014-05-30 DIAGNOSIS — M48061 Spinal stenosis, lumbar region without neurogenic claudication: Secondary | ICD-10-CM

## 2014-05-30 DIAGNOSIS — E559 Vitamin D deficiency, unspecified: Secondary | ICD-10-CM

## 2014-05-30 DIAGNOSIS — G834 Cauda equina syndrome: Secondary | ICD-10-CM | POA: Insufficient documentation

## 2014-05-30 DIAGNOSIS — M4806 Spinal stenosis, lumbar region: Secondary | ICD-10-CM | POA: Insufficient documentation

## 2014-05-30 MED ORDER — PROMETHAZINE HCL 12.5 MG PO TABS: 12.5000 mg | ORAL_TABLET | Freq: Four times a day (QID) | ORAL | Status: DC | PRN

## 2014-05-30 MED ORDER — AMITRIPTYLINE HCL 100 MG PO TABS: 100.0000 mg | ORAL_TABLET | Freq: Every day | ORAL | Status: DC

## 2014-05-30 MED ORDER — TIZANIDINE HCL 4 MG PO TABS: ORAL_TABLET | ORAL | Status: DC

## 2014-05-30 MED ORDER — ERGOCALCIFEROL 1.25 MG (50000 UT) PO CAPS: 50000.0000 [IU] | ORAL_CAPSULE | ORAL | Status: AC

## 2014-05-30 MED ORDER — MORPHINE SULFATE ER 30 MG PO TBCR: 30.0000 mg | EXTENDED_RELEASE_TABLET | Freq: Two times a day (BID) | ORAL | Status: DC

## 2014-05-30 MED ORDER — GABAPENTIN 300 MG PO CAPS: 300.0000 mg | ORAL_CAPSULE | Freq: Three times a day (TID) | ORAL | Status: DC

## 2014-05-30 MED ORDER — OXYCODONE-ACETAMINOPHEN 10-325 MG PO TABS: ORAL_TABLET | ORAL | Status: DC

## 2014-05-30 NOTE — Telephone Encounter (Signed)
Reviewed laboratory results; patient has a vitamin D deficiency. Will start Drisdol 50, 000 units weekly for 3 months, will re-check Vitamin D levels at that time.   Meds ordered this encounter  Medications  . ergocalciferol (DRISDOL) 50000 UNITS capsule    Sig: Take 1 capsule (50,000 Units total) by mouth once a week.    Dispense:  4 capsule    Refill:  2  Ruxin Ransome M, FNP

## 2014-05-30 NOTE — Progress Notes (Signed)
Subjective:    Patient ID: [name], female    DOB: May 27, 1970, 44 y.o.   MRN: [id number]  HPI: [name] is a 44 year old female who returns for follow up for chronic pain and medication refill. She says her pain is located in her lower back and bilateral lower extremities. She rates her pain 8. Her exercise regime is attending water aerobics three times a week.  She was getting out of the tub this morning lost her footing and bumped her left great toe, she was able to brace herself from falling on the floor. She hasn't had her medication for two weeks due to financial hardship and insurance changes. She now has the orange card and will be obtaining her medication at the [facility].   Pain Inventory Average Pain 8 Pain Right Now 8 My pain is intermittent, sharp, burning, stabbing, tingling and aching  In the last 24 hours, has pain interfered with the following? General activity 8 Relation with others 10 Enjoyment of life 8 What TIME of day is your pain at its worst? all Sleep (in general) Poor  Pain is worse with: walking, bending, sitting and standing Pain improves with: medication and medication improves pain when taking Relief from Meds: 8  Mobility walk with assistance use a cane  Function not employed: date last employed . I need assistance with the following:  household duties and shopping  Neuro/Psych trouble walking spasms  Prior Studies Any changes since last visit?  no  Physicians involved in your care Any changes since last visit?  no   Family History  Problem Relation Age of Onset  . Anesthesia problems Neg Hx   . Hypertension Maternal Grandmother    History   Social History  . Marital Status: Divorced    Spouse Name: N/A  . Number of Children: N/A  . Years of Education: N/A   Social History Main Topics  . Smoking status: Never Smoker   . Smokeless tobacco: Never Used  . Alcohol Use: No  . Drug Use: No  . Sexual  Activity: Not Currently    Birth Control/ Protection: None     Comment: IUD removed 2 wks ago.   Other Topics Concern  . None   Social History Narrative   Past Surgical History  Procedure Laterality Date  . Neck surgery    . Cervical spine surgery  10  . Myomectomy  13  . Wisdom tooth extraction    . Cesarean section  1996  . Abdominal hysterectomy  06/29/11    Robotic Assisted Hysterectomy  . Lumbar laminectomy  08/31/2011    Procedure: MICRODISCECTOMY LUMBAR LAMINECTOMY;  Surgeon: Tobi Bastos, MD;  Location: [facility];  Service: Orthopedics;  Laterality: N/A;  lumbar five sacral one central microdisectomy  . Posterior fusion cervical spine  02/02/2013    C 5 C6     [name]  . Posterior cervical fusion/foraminotomy N/A 02/02/2013    Procedure: POSTERIOR C5-6 SPINAL FUSION;  Surgeon: Melina Schools, MD;  Location: [facility];  Service: Orthopedics;  Laterality: N/A;   Past Medical History  Diagnosis Date  . DVT (deep venous thrombosis)     ? vs lupus  . Fibroids   . Headache(784.0)   . Difficult intravenous access     hands are usually best for blood draws and IV starts  . Neck pain   . Arthritis   . Anemia     hx  . Complication of  anesthesia     slow to wake up,   very difficult to get iv had picc line last time  . Scoliosis    LMP 02/27/2011  Opioid Risk Score:   Fall Risk Score: Low Fall Risk (0-5 points)  Review of Systems  Constitutional:       Night sweats Weight gain Weight loss  Respiratory: Positive for cough.   Cardiovascular: Positive for leg swelling.  Gastrointestinal: Positive for nausea, vomiting, abdominal pain and constipation.  Musculoskeletal: Positive for gait problem.  Neurological:       Spasms  All other systems reviewed and are negative.      Objective:   Physical Exam  Constitutional: She is oriented to person, place, and time. She appears well-developed and well-nourished.  HENT:  Head: Normocephalic and atraumatic.  Neck: Normal  range of motion. Neck supple.  Cardiovascular: Normal rate and regular rhythm.   Pulmonary/Chest: Effort normal and breath sounds normal.  Musculoskeletal:  Normal Muscle Bulk and Muscle Testing Reveals: Upper Extremities: Full ROM and Muscle Strength 5/5 Thoracic Paraspinal Tenderness: T-1- T-2 Lumbar Hypersensitivity Lower Extremities: Full ROM and Muscle strength 5/5 Arises from chair slowly with support of straight cane  Wide Based Antalgic Gait  Neurological: She is alert and oriented to person, place, and time.  Skin: Skin is warm and dry.  Psychiatric: She has a normal mood and affect.  Nursing note and vitals reviewed.         Assessment & Plan:  1. 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 Status post decompressive laminectomy:  Refilled: Oxycodone 10mg /325 mg 1-2 tablets every 4 hours, to #70. MS Contin 30 mg one tablet every 12 hours as needed. #60.  2. Insomnia: On elavil 100 mg hs.  3. Neuropathic pain: Continue Gabapentin to 300mg  TID  4. Muscle Spasms. Continue: Tizanidine, for muscle spasms  5. Neurogenic bowel and bladder- Continue Linzess effective.  20 minutes of face to face patient care time was spent during this visit. All questions were encouraged and answered.   F/U in 1 month

## 2014-05-30 NOTE — Telephone Encounter (Signed)
Left message for patient to return call concerning lab results. Thanks!

## 2014-05-30 NOTE — Telephone Encounter (Signed)
Patient returned call, I advised her of lab results to start vitamin D supplement and to modify diet to help with anemia. Patient verbalized understanding. Thanks!

## 2014-06-05 ENCOUNTER — Ambulatory Visit: Payer: Self-pay | Attending: Internal Medicine

## 2014-06-27 ENCOUNTER — Encounter: Payer: Self-pay | Attending: Physical Medicine and Rehabilitation | Admitting: Registered Nurse

## 2014-06-27 ENCOUNTER — Encounter: Payer: Self-pay | Admitting: Registered Nurse

## 2014-06-27 VITALS — BP 129/76 | HR 80 | Resp 14

## 2014-06-27 DIAGNOSIS — M4806 Spinal stenosis, lumbar region: Secondary | ICD-10-CM | POA: Insufficient documentation

## 2014-06-27 DIAGNOSIS — G894 Chronic pain syndrome: Secondary | ICD-10-CM

## 2014-06-27 DIAGNOSIS — M5126 Other intervertebral disc displacement, lumbar region: Secondary | ICD-10-CM

## 2014-06-27 DIAGNOSIS — G834 Cauda equina syndrome: Secondary | ICD-10-CM

## 2014-06-27 DIAGNOSIS — M961 Postlaminectomy syndrome, not elsewhere classified: Secondary | ICD-10-CM

## 2014-06-27 DIAGNOSIS — Z79899 Other long term (current) drug therapy: Secondary | ICD-10-CM

## 2014-06-27 DIAGNOSIS — M48061 Spinal stenosis, lumbar region without neurogenic claudication: Secondary | ICD-10-CM

## 2014-06-27 DIAGNOSIS — Z5181 Encounter for therapeutic drug level monitoring: Secondary | ICD-10-CM

## 2014-06-27 MED ORDER — OXYCODONE-ACETAMINOPHEN 10-325 MG PO TABS: ORAL_TABLET | ORAL | Status: DC

## 2014-06-27 MED ORDER — MORPHINE SULFATE ER 30 MG PO TBCR: 30.0000 mg | EXTENDED_RELEASE_TABLET | Freq: Two times a day (BID) | ORAL | Status: DC

## 2014-06-27 NOTE — Progress Notes (Signed)
Subjective:    Patient ID: [name], female    DOB: 1970-04-23, 44 y.o.   MRN: [id number]  HPI: [name] is a 44 year old female who returns for follow up for chronic pain and medication refill. She says her pain is located in her lower back and bilateral lower extremities. She rates her pain 9. Her exercise regime is attending water aerobics three times a week.  She states "she is nauseated and constipated today, she hasn't had a bowel movement in a week or so". She has the orange card and they wouldn't fill her Linzess. I called [facility] and spoke to the pharmacist they will give [name] some samples also they are waiting for her PCP to return paperwork. This was convey to [name] she verbalizes understanding. She was instructed to call office on Friday if she hasn't had a bowel movement she verbalizes understanding.  Called [name] at 3:19 she was able to pick up her Linzess and has taken the medication. She was instructed to go to ED if she doesn't have a bowel movement she verbalizes understanding. Also instructed to call office in the morning she verbalizes understanding.  Pain Inventory Average Pain 9 Pain Right Now 9 My pain is sharp, burning, stabbing, tingling and aching  In the last 24 hours, has pain interfered with the following? General activity 4 Relation with others 2 Enjoyment of life 2 What TIME of day is your pain at its worst? all Sleep (in general) Poor  Pain is worse with: walking, sitting and standing Pain improves with: medication Relief from Meds: 5  Mobility use a cane ability to climb steps?  yes do you drive?  no  Function not employed: date last employed . I need assistance with the following:  dressing, meal prep, household duties and shopping  Neuro/Psych numbness tingling trouble walking spasms anxiety  Prior Studies Any changes since last visit?  no  Physicians involved in your care Any  changes since last visit?  no   Family History  Problem Relation Age of Onset  . Anesthesia problems Neg Hx   . Hypertension Maternal Grandmother    History   Social History  . Marital Status: Divorced    Spouse Name: N/A  . Number of Children: N/A  . Years of Education: N/A   Social History Main Topics  . Smoking status: Never Smoker   . Smokeless tobacco: Never Used  . Alcohol Use: No  . Drug Use: No  . Sexual Activity: Not Currently    Birth Control/ Protection: None     Comment: IUD removed 2 wks ago.   Other Topics Concern  . None   Social History Narrative   Past Surgical History  Procedure Laterality Date  . Neck surgery    . Cervical spine surgery  10  . Myomectomy  13  . Wisdom tooth extraction    . Cesarean section  1996  . Abdominal hysterectomy  06/29/11    Robotic Assisted Hysterectomy  . Lumbar laminectomy  08/31/2011    Procedure: MICRODISCECTOMY LUMBAR LAMINECTOMY;  Surgeon: Tobi Bastos, MD;  Location: [facility];  Service: Orthopedics;  Laterality: N/A;  lumbar five sacral one central microdisectomy  . Posterior fusion cervical spine  02/02/2013    C 5 C6     [name]  . Posterior cervical fusion/foraminotomy N/A 02/02/2013    Procedure: POSTERIOR C5-6 SPINAL FUSION;  Surgeon: Melina Schools, MD;  Location:  [facility];  Service: Orthopedics;  Laterality: N/A;   Past Medical History  Diagnosis Date  . DVT (deep venous thrombosis)     ? vs lupus  . Fibroids   . Headache(784.0)   . Difficult intravenous access     hands are usually best for blood draws and IV starts  . Neck pain   . Arthritis   . Anemia     hx  . Complication of anesthesia     slow to wake up,   very difficult to get iv had picc line last time  . Scoliosis    BP 129/76 mmHg  Pulse 80  Resp 14  SpO2 100%  LMP 02/27/2011  Opioid Risk Score:   Fall Risk Score: Moderate Fall Risk (6-13 points) (previously educated and given handout)`1  Depression screen PHQ 2/9  Depression  screen [facility] 2/9 06/27/2014 05/22/2014  Decreased Interest 3 0  Down, Depressed, Hopeless 1 0  PHQ - 2 Score 4 0  Altered sleeping 3 -  Tired, decreased energy 3 -  Change in appetite 3 -  Feeling bad or failure about yourself  1 -  Trouble concentrating 0 -  Moving slowly or fidgety/restless 0 -  Suicidal thoughts 0 -  PHQ-9 Score 14 -     Review of Systems  Constitutional: Positive for chills, diaphoresis and unexpected weight change.  Respiratory: Positive for cough.   Cardiovascular: Positive for leg swelling.  Gastrointestinal: Positive for nausea, vomiting and constipation.  Musculoskeletal: Positive for gait problem.       Spasms  Neurological: Positive for numbness.       Tingling  Psychiatric/Behavioral: The patient is nervous/anxious.   All other systems reviewed and are negative.      Objective:   Physical Exam  Constitutional: She is oriented to person, place, and time. She appears well-developed and well-nourished.  HENT:  Head: Normocephalic and atraumatic.  Neck: Normal range of motion. Neck supple.  Cardiovascular: Normal rate and regular rhythm.   Pulmonary/Chest: Effort normal and breath sounds normal.  Musculoskeletal:  Normal Muscle Bulk and Muscle Testing Reveals: Upper Extremities: Full ROM and Muscle Strength 5/5 Lumbar Paraspinal Tenderness: L-3- L-5 Lower Extremities: Full ROM and Muscle Strength 5/5 Bilateral Lower Extremity Flexion Produces pain into Lower extremities Arises from chair with ease/ using straight cane for support Antalgic Gait    Neurological: She is alert and oriented to person, place, and time.  Skin: Skin is warm and dry.  Psychiatric: She has a normal mood and affect.  Nursing note and vitals reviewed.         Assessment & Plan:  1. 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 Status post decompressive laminectomy:  Refilled: Oxycodone 10mg /325 mg 1-2 tablets every 4 hours, to #70. MS Contin 30 mg one tablet  every 12 hours as needed. #60.  2. Insomnia: On elavil 100 mg hs.  3. Neuropathic pain: Continue Gabapentin to 300mg  TID  4. Muscle Spasms. Continue: Tizanidine, for muscle spasms  5. Neurogenic bowel and bladder- Continue Linzess.  20 minutes of face to face patient care time was spent during this visit. All questions were encouraged and answered.   F/U in 1 month

## 2014-07-13 ENCOUNTER — Other Ambulatory Visit: Payer: Self-pay | Admitting: Internal Medicine

## 2014-07-13 DIAGNOSIS — K5909 Other constipation: Secondary | ICD-10-CM

## 2014-07-13 MED ORDER — LINACLOTIDE 290 MCG PO CAPS: 290.0000 ug | ORAL_CAPSULE | Freq: Every day | ORAL | Status: DC

## 2014-07-15 ENCOUNTER — Emergency Department (HOSPITAL_COMMUNITY): Payer: Self-pay

## 2014-07-15 ENCOUNTER — Encounter (HOSPITAL_COMMUNITY): Payer: Self-pay

## 2014-07-15 ENCOUNTER — Emergency Department (HOSPITAL_COMMUNITY)
Admission: EM | Admit: 2014-07-15 | Discharge: 2014-07-15 | Disposition: A | Discharge status: Home / Self Care | Payer: Self-pay | Attending: Emergency Medicine | Admitting: Emergency Medicine

## 2014-07-15 DIAGNOSIS — Z862 Personal history of diseases of the blood and blood-forming organs and certain disorders involving the immune mechanism: Secondary | ICD-10-CM | POA: Insufficient documentation

## 2014-07-15 DIAGNOSIS — M549 Dorsalgia, unspecified: Secondary | ICD-10-CM

## 2014-07-15 DIAGNOSIS — G8929 Other chronic pain: Secondary | ICD-10-CM | POA: Insufficient documentation

## 2014-07-15 DIAGNOSIS — M545 Low back pain: Secondary | ICD-10-CM | POA: Insufficient documentation

## 2014-07-15 DIAGNOSIS — Z86018 Personal history of other benign neoplasm: Secondary | ICD-10-CM | POA: Insufficient documentation

## 2014-07-15 DIAGNOSIS — Z79899 Other long term (current) drug therapy: Secondary | ICD-10-CM | POA: Insufficient documentation

## 2014-07-15 DIAGNOSIS — Z86718 Personal history of other venous thrombosis and embolism: Secondary | ICD-10-CM | POA: Insufficient documentation

## 2014-07-15 DIAGNOSIS — M5136 Other intervertebral disc degeneration, lumbar region: Secondary | ICD-10-CM | POA: Insufficient documentation

## 2014-07-15 DIAGNOSIS — Z880 Allergy status to penicillin: Secondary | ICD-10-CM | POA: Insufficient documentation

## 2014-07-15 DIAGNOSIS — R52 Pain, unspecified: Secondary | ICD-10-CM

## 2014-07-15 MED ORDER — HYDROMORPHONE HCL 1 MG/ML IJ SOLN
1.0000 mg | Freq: Once | INTRAMUSCULAR | Status: AC
  Administered 2014-07-15: 1 mg via INTRAMUSCULAR
  Filled 2014-07-15: qty 1

## 2014-07-15 MED ORDER — DIPHENHYDRAMINE HCL 25 MG PO CAPS
25.0000 mg | ORAL_CAPSULE | Freq: Once | ORAL | Status: AC
  Administered 2014-07-15: 25 mg via ORAL
  Filled 2014-07-15: qty 1

## 2014-07-15 MED ORDER — ONDANSETRON 4 MG PO TBDP
4.0000 mg | ORAL_TABLET | Freq: Once | ORAL | Status: AC
  Administered 2014-07-15: 4 mg via ORAL
  Filled 2014-07-15: qty 1

## 2014-07-15 NOTE — ED Notes (Signed)
She c/o chronic low back pain and cites hx of back surgery for herniated disc.  She is here today with "the pain is worse" x 3 days.  She also c/o some paresthesias of her entire left side.  She is in no distress.

## 2014-07-15 NOTE — ED Provider Notes (Signed)
CSN: [id number]     Arrival date & time 07/15/14  1111 History   First MD Initiated Contact with Patient 07/15/14 1208     Chief Complaint  Patient presents with  . Back Pain     (Consider location/radiation/quality/duration/timing/severity/associated sxs/prior Treatment) Patient is a 44 y.o. female presenting with back pain. The history is provided by the patient.  Back Pain Associated symptoms: no abdominal pain, no fever, no numbness and no weakness   Patient w hx degenerative disc disease, prior low back surgery, and chronic/recurrent low back pain since, c/o worsening of her back pain in the past few days.  States pain is worst in low back and left buttock/sciatic notch area, and that at times it travels down left leg. No leg numbness/weakness, or loss of normal functioning. No saddle area numbness. No urinary retention or other gi/gu c/o. No fever or chills. Denies recent trauma, strain or fall. States has taken no meds for pain yet today. No leg swelling. No redness/rash to area of pain.     Past Medical History  Diagnosis Date  . DVT (deep venous thrombosis)     ? vs lupus  . Fibroids   . Headache(784.0)   . Difficult intravenous access     hands are usually best for blood draws and IV starts  . Neck pain   . Arthritis   . Anemia     hx  . Complication of anesthesia     slow to wake up,   very difficult to get iv had picc line last time  . Scoliosis    Past Surgical History  Procedure Laterality Date  . Neck surgery    . Cervical spine surgery  10  . Myomectomy  13  . Wisdom tooth extraction    . Cesarean section  1996  . Abdominal hysterectomy  06/29/11    Robotic Assisted Hysterectomy  . Lumbar laminectomy  08/31/2011    Procedure: MICRODISCECTOMY LUMBAR LAMINECTOMY;  Surgeon: Tobi Bastos, MD;  Location: [facility];  Service: Orthopedics;  Laterality: N/A;  lumbar five sacral one central microdisectomy  . Posterior fusion cervical spine  02/02/2013    C 5 C6      [name]  . Posterior cervical fusion/foraminotomy N/A 02/02/2013    Procedure: POSTERIOR C5-6 SPINAL FUSION;  Surgeon: Melina Schools, MD;  Location: [facility];  Service: Orthopedics;  Laterality: N/A;   Family History  Problem Relation Age of Onset  . Anesthesia problems Neg Hx   . Hypertension Maternal Grandmother    History  Substance Use Topics  . Smoking status: Never Smoker   . Smokeless tobacco: Never Used  . Alcohol Use: No   OB History    Gravida Para Term Preterm AB TAB SAB Ectopic Multiple Living   2 1 1  0 1 0 1 0 0 1     Review of Systems  Constitutional: Negative for fever and chills.  Cardiovascular: Negative for leg swelling.  Gastrointestinal: Negative for abdominal pain.  Genitourinary: Negative for difficulty urinating.  Musculoskeletal: Positive for back pain. Negative for gait problem.  Skin: Negative for rash.  Neurological: Negative for weakness and numbness.      Allergies  Codeine; Iodine; Penicillins; Shrimp; Contrast media; and Meperidine and related  Home Medications   Prior to Admission medications   Medication Sig Start Date End Date Taking? Authorizing Provider  amitriptyline (ELAVIL) 100 MG tablet Take 1 tablet (100 mg total) by mouth at bedtime. 05/30/14   Zella Ball L  Marcello Moores, NP  cetirizine (ZYRTEC) 10 MG tablet Take 1 tablet (10 mg total) by mouth daily. 05/22/14   Dorena Dew, FNP  cholecalciferol (VITAMIN D) 1000 UNITS tablet Take 2,000 Units by mouth every morning.     Historical Provider, MD  ergocalciferol (DRISDOL) 50000 UNITS capsule Take 1 capsule (50,000 Units total) by mouth once a week. 05/30/14   Dorena Dew, FNP  gabapentin (NEURONTIN) 300 MG capsule Take 1 capsule (300 mg total) by mouth 3 (three) times daily. 05/30/14   Bayard Hugger, NP  Linaclotide (LINZESS) 290 MCG CAPS capsule Take 1 capsule (290 mcg total) by mouth daily. 07/13/14   Tresa Garter, MD  morphine (MS CONTIN) 30 MG 12 hr tablet Take 1 tablet (30 mg  total) by mouth every 12 (twelve) hours. 06/27/14   Bayard Hugger, NP  oxyCODONE-acetaminophen (PERCOCET) 10-325 MG per tablet 1- 2 tablets every 4 - 5 hours as needed for moderate pain 06/27/14   Bayard Hugger, NP  promethazine (PHENERGAN) 12.5 MG tablet Take 1 tablet (12.5 mg total) by mouth every 6 (six) hours as needed for nausea or vomiting. 05/30/14   Bayard Hugger, NP  tiZANidine (ZANAFLEX) 4 MG tablet May take 1- 2 tablets every 8 hours as needed for muscle spasms 05/30/14   Bayard Hugger, NP   BP 121/81 mmHg  Pulse 77  Temp(Src) 98.5 F (36.9 C)  Resp 16  SpO2 100%  LMP 02/27/2011 Physical Exam  Constitutional: She is oriented to person, place, and time. She appears well-developed and well-nourished. No distress.  Eyes: Conjunctivae are normal. No scleral icterus.  Neck: Neck supple. No tracheal deviation present.  Cardiovascular: Normal rate.   Pulmonary/Chest: Effort normal. No respiratory distress.  Abdominal: Soft. Normal appearance. She exhibits no distension and no mass. There is no tenderness. There is no rebound and no guarding.  Genitourinary:  No cva tenderness  Musculoskeletal: She exhibits no edema.  TLS spine non tender, aligned no step off. Diffuse lumbar muscular tenderness. Tenderness at left sciatic notch. Good rom at left hip and knee without pain. Distal pulses palp. No skin changes, rash, erythema, or sts to area of pain.   Neurological: She is alert and oriented to person, place, and time. She displays normal reflexes.  Motor intact lower ext, stre 5/5. sens grossly intact. Straight leg raise neg.   Skin: Skin is warm and dry. No rash noted. She is not diaphoretic.  Psychiatric: She has a normal mood and affect.  Nursing note and vitals reviewed.   ED Course  Procedures (including critical care time) Labs Review  Dg Lumbar Spine Complete  07/15/2014   CLINICAL DATA:  Chronic low back pain, worse for the past 3 days. Diffuse left-sided paresthesias.   EXAM: LUMBAR SPINE - COMPLETE 4+ VIEW  COMPARISON:  Previous examinations.  FINDINGS: Five non-rib-bearing lumbar vertebrae. Stable mild dextroconvex rotary scoliosis. Changes of discogenic sclerosis at the L5-S1 level. Mild facet degenerative changes in the lower lumbar spine. No fractures, pars defects or subluxations.  IMPRESSION: Mild degenerative changes.  No acute abnormality.   Electronically Signed   By: Claudie Revering M.D.   On: 07/15/2014 13:37      MDM   Xr.   Pt requests pain med, has ride. Dilaudid 1 mg im.  Reviewed nursing notes and prior charts for additional history.   Recheck pt comfortable, smiling, alert, conversant w family, appears in no severe pain or distress.  Discussed xrays w pt.  Pt currently appears stable for d/c.    Lajean Saver, MD 07/15/14 1344

## 2014-07-15 NOTE — Discharge Instructions (Signed)
It was our pleasure to provide your ER care today - we hope that you feel better.  Take your pain medication as need.  Avoid bending at waist or heavy lifting more than 20 lbs for the next week. Try heat/heating pad to sore area.   Follow up with primary care doctor in coming week for recheck - discuss any needed medication refills with your primary care doctor.  Return to ER if worse, new symptoms, fevers,  leg numbness/weakness, other concern.  You were given pain medication in the ER - no driving for the next 4 hours.    Back Pain, Adult Low back pain is very common. About 1 in 5 people have back pain.The cause of low back pain is rarely dangerous. The pain often gets better over time.About half of people with a sudden onset of back pain feel better in just 2 weeks. About 8 in 10 people feel better by 6 weeks.  CAUSES Some common causes of back pain include:  Strain of the muscles or ligaments supporting the spine.  Wear and tear (degeneration) of the spinal discs.  Arthritis.  Direct injury to the back. DIAGNOSIS Most of the time, the direct cause of low back pain is not known.However, back pain can be treated effectively even when the exact cause of the pain is unknown.Answering your caregiver's questions about your overall health and symptoms is one of the most accurate ways to make sure the cause of your pain is not dangerous. If your caregiver needs more information, he or she may order lab work or imaging tests (X-rays or MRIs).However, even if imaging tests show changes in your back, this usually does not require surgery. HOME CARE INSTRUCTIONS For many people, back pain returns.Since low back pain is rarely dangerous, it is often a condition that people can learn to [facility] their own.   Remain active. It is stressful on the back to sit or stand in one place. Do not sit, drive, or stand in one place for more than 30 minutes at a time. Take short walks on level  surfaces as soon as pain allows.Try to increase the length of time you walk each day.  Do not stay in bed.Resting more than 1 or 2 days can delay your recovery.  Do not avoid exercise or work.Your body is made to move.It is not dangerous to be active, even though your back may hurt.Your back will likely heal faster if you return to being active before your pain is gone.  Pay attention to your body when you bend and lift. Many people have less discomfortwhen lifting if they bend their knees, keep the load close to their bodies,and avoid twisting. Often, the most comfortable positions are those that put less stress on your recovering back.  Find a comfortable position to sleep. Use a firm mattress and lie on your side with your knees slightly bent. If you lie on your back, put a pillow under your knees.  Only take over-the-counter or prescription medicines as directed by your caregiver. Over-the-counter medicines to reduce pain and inflammation are often the most helpful.Your caregiver may prescribe muscle relaxant drugs.These medicines help dull your pain so you can more quickly return to your normal activities and healthy exercise.  Put ice on the injured area.  Put ice in a plastic bag.  Place a towel between your skin and the bag.  Leave the ice on for 15-20 minutes, 03-04 times a day for the first 2 to 3 days.  After that, ice and heat may be alternated to reduce pain and spasms.  Ask your caregiver about trying back exercises and gentle massage. This may be of some benefit.  Avoid feeling anxious or stressed.Stress increases muscle tension and can worsen back pain.It is important to recognize when you are anxious or stressed and learn ways to manage it.Exercise is a great option. SEEK MEDICAL CARE IF:  You have pain that is not relieved with rest or medicine.  You have pain that does not improve in 1 week.  You have new symptoms.  You are generally not feeling  well. SEEK IMMEDIATE MEDICAL CARE IF:   You have pain that radiates from your back into your legs.  You develop new bowel or bladder control problems.  You have unusual weakness or numbness in your arms or legs.  You develop nausea or vomiting.  You develop abdominal pain.  You feel faint. Document Released: 03/09/2005 Document Revised: 09/08/2011 Document Reviewed: 07/11/2013 [facility] Patient Information 2015 [place], [place]. This information is not intended to replace advice given to you by your health care provider. Make sure you discuss any questions you have with your health care provider.    Degenerative Disk Disease Degenerative disk disease is a condition caused by the changes that occur in the cushions of the backbone (spinal disks) as you grow older. Spinal disks are soft and compressible disks located between the bones of the spine (vertebrae). They act like shock absorbers. Degenerative disk disease can affect the whole spine. However, the neck and lower back are most commonly affected. Many changes can occur in the spinal disks with aging, such as:  The spinal disks may dry and shrink.  Small tears may occur in the tough, outer covering of the disk (annulus).  The disk space may become smaller due to loss of water.  Abnormal growths in the bone (spurs) may occur. This can put pressure on the nerve roots exiting the spinal canal, causing pain.  The spinal canal may become narrowed. CAUSES  Degenerative disk disease is a condition caused by the changes that occur in the spinal disks with aging. The exact cause is not known, but there is a genetic basis for many patients. Degenerative changes can occur due to loss of fluid in the disk. This makes the disk thinner and reduces the space between the backbones. Small cracks can develop in the outer layer of the disk. This can lead to the breakdown of the disk. You are more likely to get degenerative disk disease if you are  overweight. Smoking cigarettes and doing heavy work such as weightlifting can also increase your risk of this condition. Degenerative changes can start after a sudden injury. Growth of bone spurs can compress the nerve roots and cause pain.  SYMPTOMS  The symptoms vary from person to person. Some people may have no pain, while others have severe pain. The pain may be so severe that it can limit your activities. The location of the pain depends on the part of your backbone that is affected. You will have neck or arm pain if a disk in the neck area is affected. You will have pain in your back, buttocks, or legs if a disk in the lower back is affected. The pain becomes worse while bending, reaching up, or with twisting movements. The pain may start gradually and then get worse as time passes. It may also start after a major or minor injury. You may feel numbness or tingling in  the arms or legs.  DIAGNOSIS  Your caregiver will ask you about your symptoms and about activities or habits that may cause the pain. He or she may also ask about any injuries, diseases, or treatments you have had earlier. Your caregiver will examine you to check for the range of movement that is possible in the affected area, to check for strength in your extremities, and to check for sensation in the areas of the arms and legs supplied by different nerve roots. An X-ray of the spine may be taken. Your caregiver may suggest other imaging tests, such as magnetic resonance imaging (MRI), if needed.  TREATMENT  Treatment includes rest, modifying your activities, and applying ice and heat. Your caregiver may prescribe medicines to reduce your pain and may ask you to do some exercises to strengthen your back. In some cases, you may need surgery. You and your caregiver will decide on the treatment that is best for you. HOME CARE INSTRUCTIONS   Follow proper lifting and walking techniques as advised by your caregiver.  Maintain good  posture.  Exercise regularly as advised.  Perform relaxation exercises.  Change your sitting, standing, and sleeping habits as advised. Change positions frequently.  Lose weight as advised.  Stop smoking if you smoke.  Wear supportive footwear. SEEK MEDICAL CARE IF:  Your pain does not go away within 1 to 4 weeks. SEEK IMMEDIATE MEDICAL CARE IF:   Your pain is severe.  You notice weakness in your arms, hands, or legs.  You begin to lose control of your bladder or bowel movements. MAKE SURE YOU:   Understand these instructions.  Will watch your condition.  Will get help right away if you are not doing well or get worse. Document Released: 01/04/2007 Document Revised: 06/01/2011 Document Reviewed: 07/11/2013 [facility] Patient Information 2015 [place], [place]. This information is not intended to replace advice given to you by your health care provider. Make sure you discuss any questions you have with your health care provider.    Chronic Back Pain  When back pain lasts longer than 3 months, it is called chronic back pain.People with chronic back pain often go through certain periods that are more intense (flare-ups).  CAUSES Chronic back pain can be caused by wear and tear (degeneration) on different structures in your back. These structures include:  The bones of your spine (vertebrae) and the joints surrounding your spinal cord and nerve roots (facets).  The strong, fibrous tissues that connect your vertebrae (ligaments). Degeneration of these structures may result in pressure on your nerves. This can lead to constant pain. HOME CARE INSTRUCTIONS  Avoid bending, heavy lifting, prolonged sitting, and activities which make the problem worse.  Take brief periods of rest throughout the day to reduce your pain. Lying down or standing usually is better than sitting while you are resting.  Take over-the-counter or prescription medicines only as directed by your  caregiver. SEEK IMMEDIATE MEDICAL CARE IF:   You have weakness or numbness in one of your legs or feet.  You have trouble controlling your bladder or bowels.  You have nausea, vomiting, abdominal pain, shortness of breath, or fainting. Document Released: 04/16/2004 Document Revised: 06/01/2011 Document Reviewed: 02/21/2011 [facility] Patient Information 2015 [place], [place]. This information is not intended to replace advice given to you by your health care provider. Make sure you discuss any questions you have with your health care provider.

## 2014-07-15 NOTE — ED Notes (Signed)
Gave pt another pillow to go under  Her left side.

## 2014-07-26 ENCOUNTER — Encounter: Payer: Self-pay | Attending: Physical Medicine and Rehabilitation | Admitting: Registered Nurse

## 2014-07-26 ENCOUNTER — Encounter: Payer: Self-pay | Admitting: Registered Nurse

## 2014-07-26 ENCOUNTER — Other Ambulatory Visit: Payer: Self-pay | Admitting: Registered Nurse

## 2014-07-26 VITALS — BP 132/92 | HR 88 | Resp 14

## 2014-07-26 DIAGNOSIS — M5126 Other intervertebral disc displacement, lumbar region: Secondary | ICD-10-CM | POA: Insufficient documentation

## 2014-07-26 DIAGNOSIS — M545 Low back pain: Secondary | ICD-10-CM

## 2014-07-26 DIAGNOSIS — T402X5A Adverse effect of other opioids, initial encounter: Secondary | ICD-10-CM

## 2014-07-26 DIAGNOSIS — G894 Chronic pain syndrome: Secondary | ICD-10-CM

## 2014-07-26 DIAGNOSIS — M62830 Muscle spasm of back: Secondary | ICD-10-CM

## 2014-07-26 DIAGNOSIS — M4806 Spinal stenosis, lumbar region: Secondary | ICD-10-CM | POA: Insufficient documentation

## 2014-07-26 DIAGNOSIS — Z79899 Other long term (current) drug therapy: Secondary | ICD-10-CM

## 2014-07-26 DIAGNOSIS — K5903 Drug induced constipation: Secondary | ICD-10-CM

## 2014-07-26 DIAGNOSIS — K5909 Other constipation: Secondary | ICD-10-CM

## 2014-07-26 DIAGNOSIS — M7062 Trochanteric bursitis, left hip: Secondary | ICD-10-CM

## 2014-07-26 DIAGNOSIS — M961 Postlaminectomy syndrome, not elsewhere classified: Secondary | ICD-10-CM

## 2014-07-26 DIAGNOSIS — G834 Cauda equina syndrome: Secondary | ICD-10-CM | POA: Insufficient documentation

## 2014-07-26 DIAGNOSIS — Z5181 Encounter for therapeutic drug level monitoring: Secondary | ICD-10-CM

## 2014-07-26 DIAGNOSIS — G8929 Other chronic pain: Secondary | ICD-10-CM

## 2014-07-26 MED ORDER — NAPROXEN SODIUM 220 MG PO TABS: 440.0000 mg | ORAL_TABLET | Freq: Two times a day (BID) | ORAL | Status: DC | PRN

## 2014-07-26 MED ORDER — METHYLPREDNISOLONE 4 MG PO TBPK: ORAL_TABLET | ORAL | Status: DC

## 2014-07-26 MED ORDER — MORPHINE SULFATE ER 30 MG PO TBCR: 30.0000 mg | EXTENDED_RELEASE_TABLET | Freq: Two times a day (BID) | ORAL | Status: DC

## 2014-07-26 MED ORDER — TIZANIDINE HCL 4 MG PO TABS: ORAL_TABLET | ORAL | Status: DC

## 2014-07-26 MED ORDER — OXYCODONE-ACETAMINOPHEN 10-325 MG PO TABS: ORAL_TABLET | ORAL | Status: DC

## 2014-07-26 NOTE — Progress Notes (Signed)
Subjective:    Patient ID: [name], female    DOB: 11/13/1970, 44 y.o.   MRN: [id number]   HPI: [name] is a 44 year old female who returns for follow up for chronic pain and medication refill. She says her pain is located in her lower back, left hip, left buttock and bilateral lower extremities. She rates her pain 10. Her exercise regime is attending water aerobics three times a week.  She has complaints of constipation in the past she was prescribed colace, fleets, miralax, dulcolax, suppositories and linzess. She was given two boxes of Movantik she will call office on Monday 07/30/14 regarding effectiveness. She verbalizes understanding.  On 07/15/14 she was in the shower she bent down and wasn't able to stand upright. She went to [facility] ED, Lumbar X-Ray: FINDINGS: Five non-rib-bearing lumbar vertebrae. Stable mild dextroconvexrotary scoliosis. Changes of discogenic sclerosis at the L5-S1level. Mild facet degenerative changes in the lower lumbar spine. Nofractures, pars defects or subluxations. She reecived Dilaudid 1 mg IM.   Pain Inventory Average Pain 8 Pain Right Now 10 My pain is constant, sharp, burning, stabbing, tingling and aching  In the last 24 hours, has pain interfered with the following? General activity 8 Relation with others 10 Enjoyment of life 10 What TIME of day is your pain at its worst? ALL Sleep (in general) Poor  Pain is worse with: walking, bending, sitting, standing and some activites Pain improves with: rest, heat/ice and medication Relief from Meds: 5  Mobility walk without assistance how many minutes can you walk? 10-15 ability to climb steps?  yes do you drive?  yes  Function disabled: date disabled .  Neuro/Psych numbness tingling trouble walking spasms anxiety  Prior Studies Any changes since last visit?  no  Physicians involved in your care Any changes since last visit?  no   Family History  Problem  Relation Age of Onset  . Anesthesia problems Neg Hx   . Hypertension Maternal Grandmother    History   Social History  . Marital Status: Divorced    Spouse Name: N/A  . Number of Children: N/A  . Years of Education: N/A   Social History Main Topics  . Smoking status: Never Smoker   . Smokeless tobacco: Never Used  . Alcohol Use: No  . Drug Use: No  . Sexual Activity: Not Currently    Birth Control/ Protection: None     Comment: IUD removed 2 wks ago.   Other Topics Concern  . None   Social History Narrative   Past Surgical History  Procedure Laterality Date  . Neck surgery    . Cervical spine surgery  10  . Myomectomy  13  . Wisdom tooth extraction    . Cesarean section  1996  . Abdominal hysterectomy  06/29/11    Robotic Assisted Hysterectomy  . Lumbar laminectomy  08/31/2011    Procedure: MICRODISCECTOMY LUMBAR LAMINECTOMY;  Surgeon: Tobi Bastos, MD;  Location: [facility];  Service: Orthopedics;  Laterality: N/A;  lumbar five sacral one central microdisectomy  . Posterior fusion cervical spine  02/02/2013    C 5 C6     [name]  . Posterior cervical fusion/foraminotomy N/A 02/02/2013    Procedure: POSTERIOR C5-6 SPINAL FUSION;  Surgeon: Melina Schools, MD;  Location: [facility];  Service: Orthopedics;  Laterality: N/A;   Past Medical History  Diagnosis Date  . DVT (deep venous thrombosis)     ? vs lupus  .  Fibroids   . Headache(784.0)   . Difficult intravenous access     hands are usually best for blood draws and IV starts  . Neck pain   . Arthritis   . Anemia     hx  . Complication of anesthesia     slow to wake up,   very difficult to get iv had picc line last time  . Scoliosis    BP 132/92 mmHg  Pulse 88  Resp 14  SpO2 98%  LMP 02/27/2011  Opioid Risk Score:   Fall Risk Score: Low Fall Risk (0-5 points)`1  Depression screen PHQ 2/9  Depression screen [facility] 2/9 06/27/2014 05/22/2014  Decreased Interest 3 0  Down, Depressed, Hopeless 1 0  PHQ - 2 Score 4  0  Altered sleeping 3 -  Tired, decreased energy 3 -  Change in appetite 3 -  Feeling bad or failure about yourself  1 -  Trouble concentrating 0 -  Moving slowly or fidgety/restless 0 -  Suicidal thoughts 0 -  PHQ-9 Score 14 -     Review of Systems  Constitutional:       Fever/chillsm night sweats  HENT: Negative.   Eyes: Negative.   Respiratory: Negative.   Cardiovascular: Negative.   Gastrointestinal: Positive for nausea, vomiting, abdominal pain and diarrhea.  Endocrine: Negative.   Genitourinary: Negative.   Musculoskeletal: Positive for myalgias, back pain and arthralgias.  Skin: Negative.   Allergic/Immunologic: Negative.   Neurological: Positive for numbness.       Tingling, trouble walking, spasms  Hematological: Negative.   Psychiatric/Behavioral: The patient is nervous/anxious.        Objective:   Physical Exam  Constitutional: She is oriented to person, place, and time. She appears well-developed and well-nourished.  HENT:  Head: Normocephalic and atraumatic.  Neck: Normal range of motion. Neck supple.  Cardiovascular: Normal rate and regular rhythm.   Pulmonary/Chest: Effort normal and breath sounds normal.  Musculoskeletal:  Normal Muscle Bulk and Muscle Testing Reveals: Upper Extremities: Full ROM and Muscle Strength 5/5 Lumbar Paraspinal Tenderness: L-3- L-5 Left Greater Trochanteric Tenderness Lower Extremities: Full ROM and Muscle strength 5/5 Arises from chair slowly/ using straight cane for support  Neurological: She is alert and oriented to person, place, and time.  Skin: Skin is warm and dry.  Psychiatric: She has a normal mood and affect.  Nursing note and vitals reviewed.         Assessment & Plan:  1. 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 Status post decompressive laminectomy:  Refilled: Oxycodone 10mg /325 mg 1-2 tablets every 4 hours, to #70. MS Contin 30 mg one tablet every 12 hours as needed. #60.  2. Insomnia: On  elavil 100 mg hs.  3. Neuropathic pain: Continue Gabapentin to 300mg  TID  4. Muscle Spasms. Continue: Tizanidine 5. Neurogenic bowel and bladder- Continue to monitor 6. Constipation/Opioid Therapy: Movantik Samples Given. Instructed to call office on 07/30/14. 7. Left Greater Trochanteric Bursitis: RX: Medrol Dose Pak 8. Acute Exacerbation of chronic low back pain: RX: Medrol Dose pak 20 minutes of face to face patient care time was spent during this visit. All questions were encouraged and answered.   F/U in 1 month

## 2014-07-26 NOTE — Patient Instructions (Signed)
Life Alert:   [phone]   Start Movantik on May 6th, on an empty stomach one hour prior to meal.    Call Office on Monday May 9th

## 2014-07-27 LAB — PMP ALCOHOL METABOLITE (ETG): Ethyl Glucuronide (EtG): NEGATIVE ng/mL

## 2014-07-30 LAB — OXYCODONE, URINE (LC/MS-MS)
Oxycodone, ur: 8617 ng/mL (ref <50)
Oxymorphone: 8038 ng/mL (ref <50)

## 2014-07-30 LAB — OPIATES/OPIOIDS (LC/MS-MS)
Codeine Urine: NEGATIVE ng/mL (ref <50)
HYDROMORPHONE: 66 ng/mL (ref <50)
Hydrocodone: NEGATIVE ng/mL (ref <50)
MORPHINE: 45882 ng/mL (ref <50)
NORHYDROCODONE, UR: NEGATIVE ng/mL (ref <50)
Oxycodone, ur: 8617 ng/mL (ref <50)
Oxymorphone: 8038 ng/mL (ref <50)

## 2014-07-31 LAB — PRESCRIPTION MONITORING PROFILE (SOLSTAS)
AMPHETAMINE/METH: NEGATIVE ng/mL
BENZODIAZEPINE SCREEN, URINE: NEGATIVE ng/mL
BUPRENORPHINE, URINE: NEGATIVE ng/mL
Barbiturate Screen, Urine: NEGATIVE ng/mL
Cannabinoid Scrn, Ur: NEGATIVE ng/mL
Carisoprodol, Urine: NEGATIVE ng/mL
Cocaine Metabolites: NEGATIVE ng/mL
Creatinine, Urine: 506.05 mg/dL (ref >20.0)
FENTANYL URINE: NEGATIVE ng/mL
MDMA URINE: NEGATIVE ng/mL
MEPERIDINE UR: NEGATIVE ng/mL
Methadone Screen, Urine: NEGATIVE ng/mL
Nitrites, Initial: NEGATIVE ug/mL
PROPOXYPHENE: NEGATIVE ng/mL
TAPENTADOLUR: NEGATIVE ng/mL
Tramadol Scrn, Ur: NEGATIVE ng/mL
ZOLPIDEM, URINE: NEGATIVE ng/mL
pH, Initial: 6 pH (ref 4.5–8.9)

## 2014-08-08 NOTE — Progress Notes (Signed)
Urine drug screen for this encounter is consistent for prescribed medication 

## 2014-08-27 ENCOUNTER — Ambulatory Visit (INDEPENDENT_AMBULATORY_CARE_PROVIDER_SITE_OTHER): Payer: No Typology Code available for payment source | Admitting: Internal Medicine

## 2014-08-27 VITALS — BP 118/75 | HR 89 | Temp 98.0°F | Resp 16 | Ht 65.0 in | Wt 253.0 lb

## 2014-08-27 DIAGNOSIS — E8881 Metabolic syndrome: Secondary | ICD-10-CM

## 2014-08-27 DIAGNOSIS — D649 Anemia, unspecified: Secondary | ICD-10-CM

## 2014-09-01 IMAGING — CR DG CERVICAL SPINE 2 OR 3 VIEWS
2 series · 2 of 2 positions shown · non-contrast
Comparison: CT cervical spine and cervical spine myelographic
images [id number]

CLINICAL DATA: Preop fusion.

EXAM:
CERVICAL SPINE - 2-3 VIEW

[w cervical spine lat]
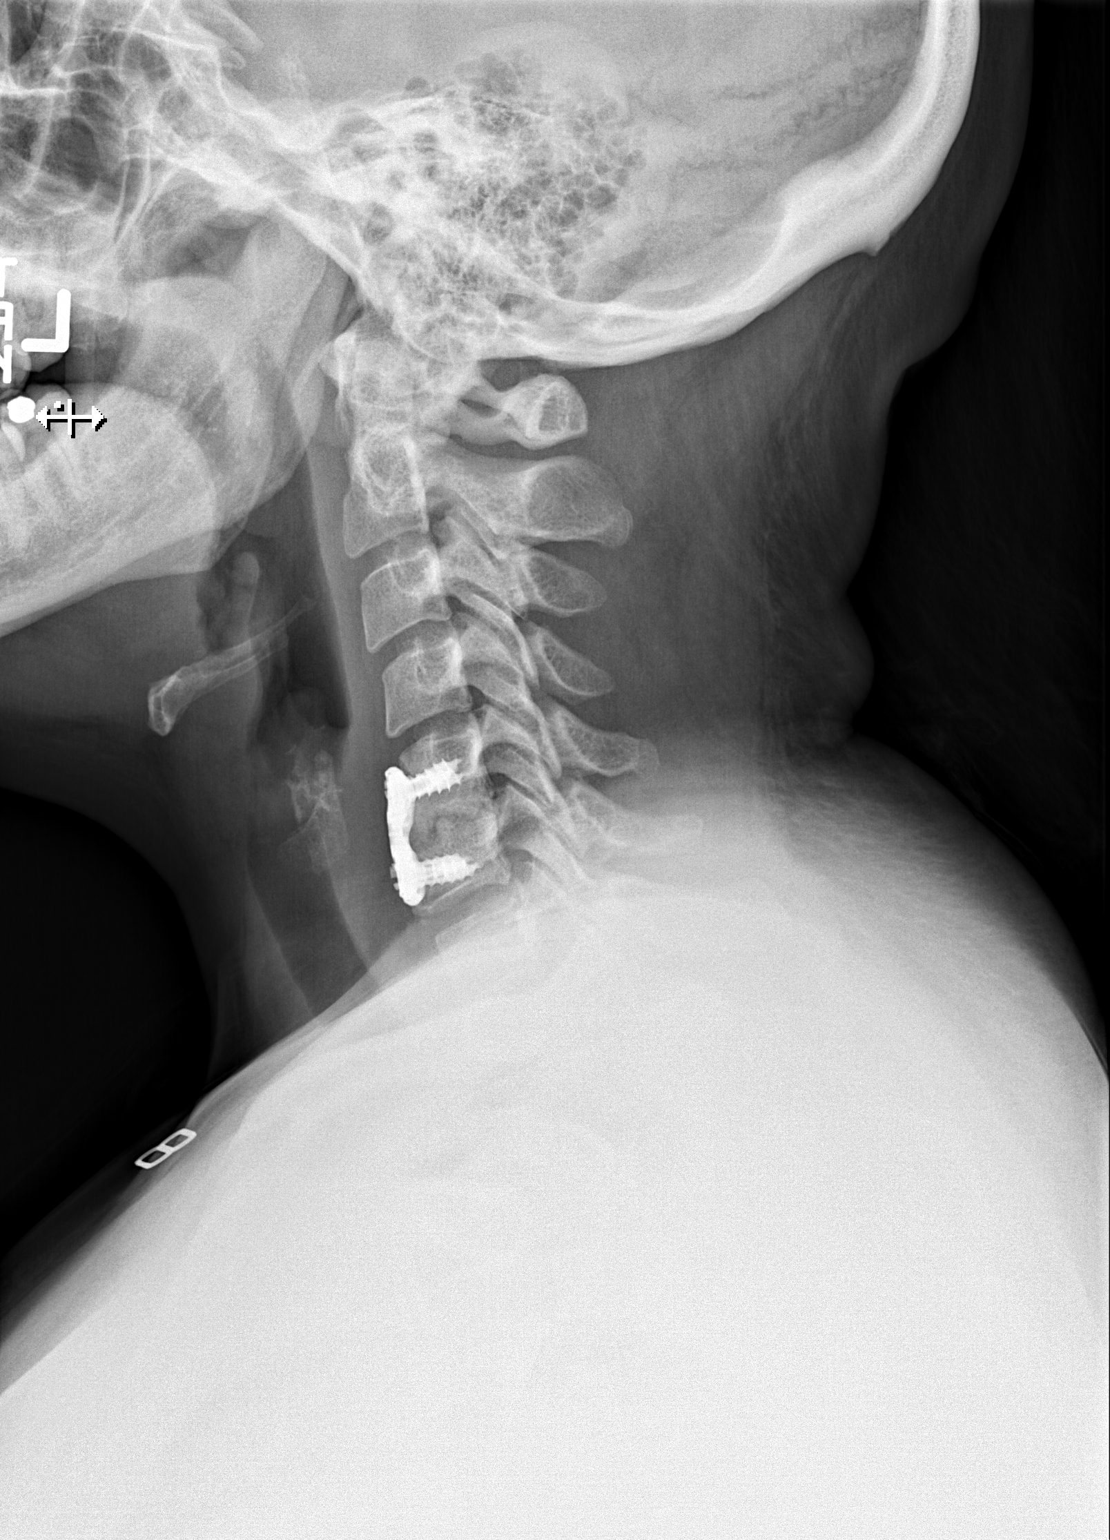

[w cervical spine ap]
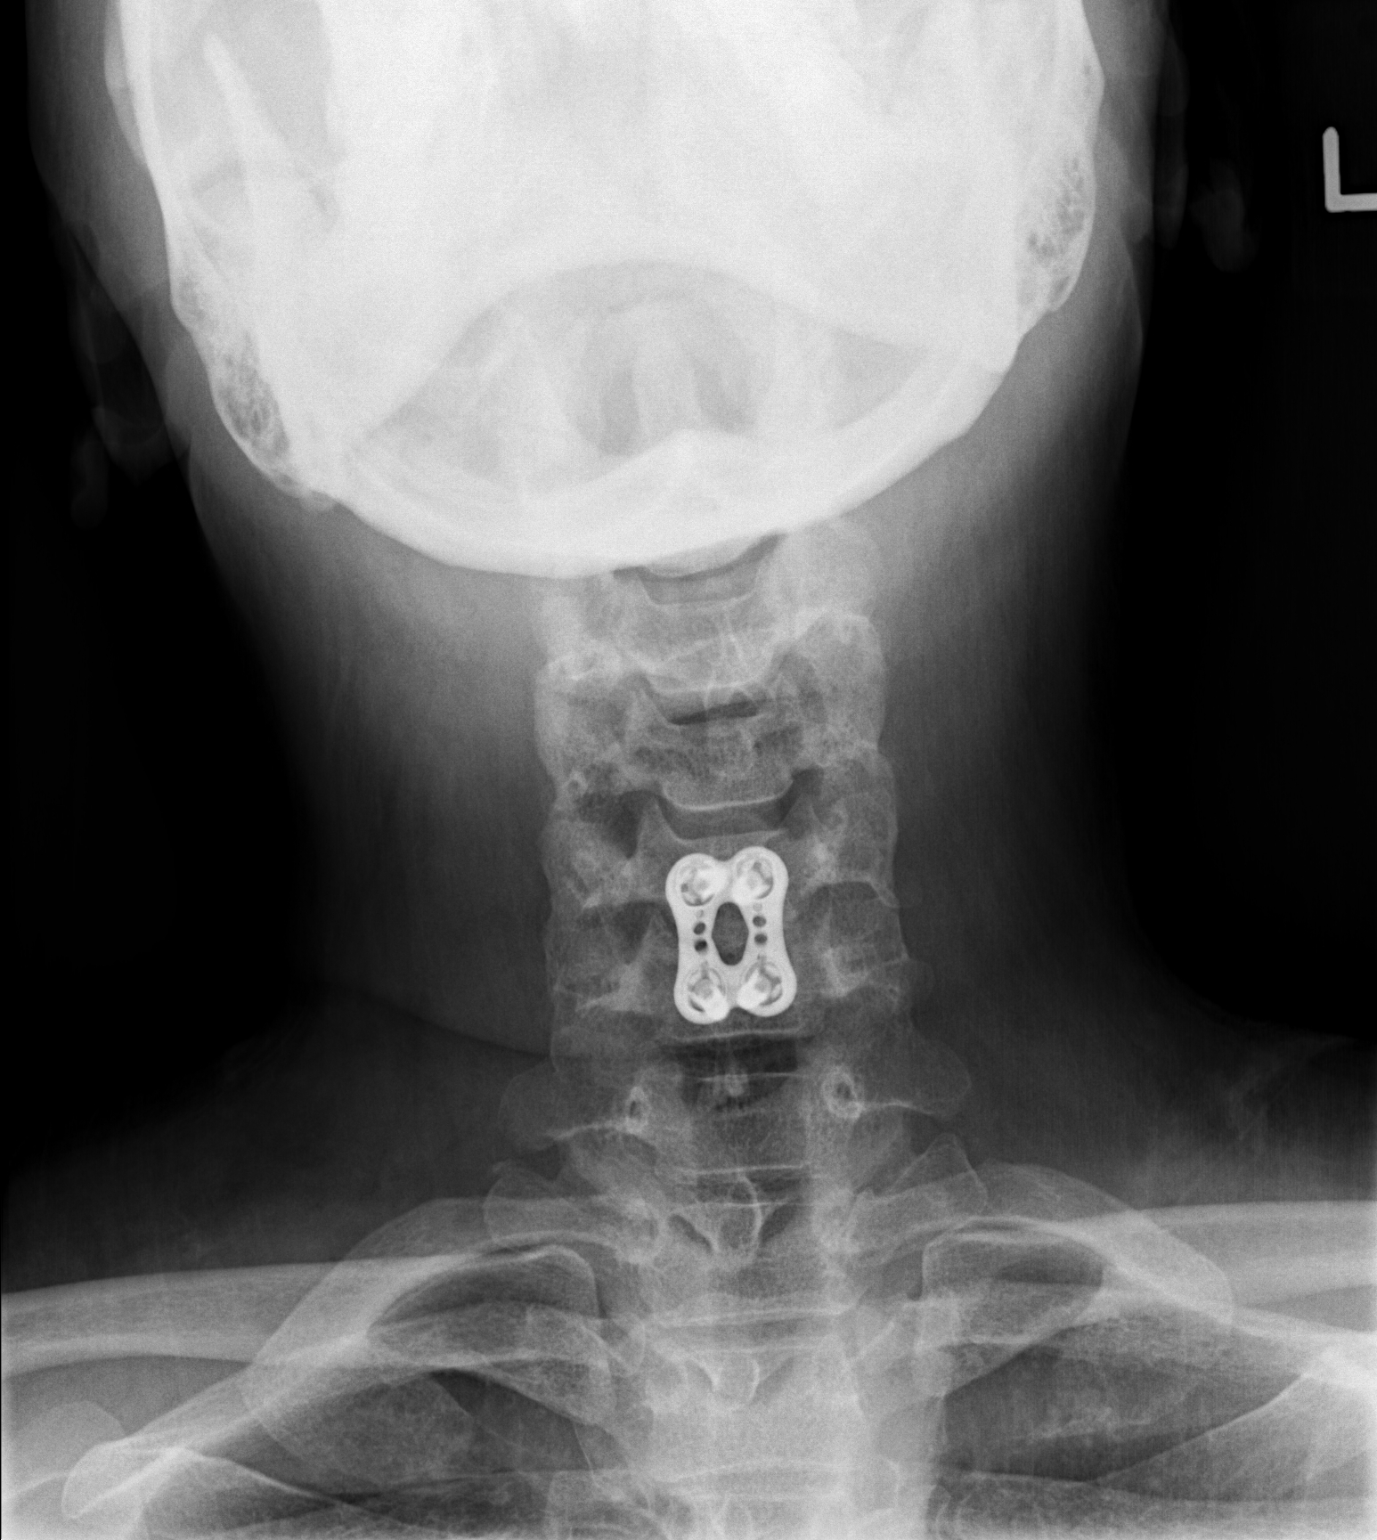

[2 of 2 positions shown; findings below may reference images not displayed]

FINDINGS: Anterior spinal fusion hardware is intact and normally located over
the C5-6 level is unchanged. Vertebral body alignment and heights
are within normal. Remainder of the exam is unchanged.
IMPRESSION: Anterior spinal fusion hardware intact and unchanged at the C5-6
level. No acute findings.

## 2014-09-04 ENCOUNTER — Encounter
Payer: No Typology Code available for payment source | Attending: Physical Medicine and Rehabilitation | Admitting: Registered Nurse

## 2014-09-04 ENCOUNTER — Encounter: Payer: Self-pay | Admitting: Registered Nurse

## 2014-09-04 VITALS — BP 121/77 | HR 92 | Resp 14

## 2014-09-04 DIAGNOSIS — M48061 Spinal stenosis, lumbar region without neurogenic claudication: Secondary | ICD-10-CM

## 2014-09-04 DIAGNOSIS — G834 Cauda equina syndrome: Secondary | ICD-10-CM

## 2014-09-04 DIAGNOSIS — Z79899 Other long term (current) drug therapy: Secondary | ICD-10-CM

## 2014-09-04 DIAGNOSIS — M961 Postlaminectomy syndrome, not elsewhere classified: Secondary | ICD-10-CM

## 2014-09-04 DIAGNOSIS — M5126 Other intervertebral disc displacement, lumbar region: Secondary | ICD-10-CM

## 2014-09-04 DIAGNOSIS — Z5181 Encounter for therapeutic drug level monitoring: Secondary | ICD-10-CM

## 2014-09-04 DIAGNOSIS — M4806 Spinal stenosis, lumbar region: Secondary | ICD-10-CM

## 2014-09-04 DIAGNOSIS — G894 Chronic pain syndrome: Secondary | ICD-10-CM

## 2014-09-04 MED ORDER — OXYCODONE-ACETAMINOPHEN 10-325 MG PO TABS: ORAL_TABLET | ORAL | Status: DC

## 2014-09-04 MED ORDER — MORPHINE SULFATE ER 30 MG PO TBCR: 30.0000 mg | EXTENDED_RELEASE_TABLET | Freq: Two times a day (BID) | ORAL | Status: DC

## 2014-09-04 MED ORDER — NAPROXEN SODIUM 220 MG PO TABS: 440.0000 mg | ORAL_TABLET | Freq: Two times a day (BID) | ORAL | Status: DC | PRN

## 2014-09-04 MED ORDER — PROMETHAZINE HCL 12.5 MG PO TABS: 12.5000 mg | ORAL_TABLET | Freq: Four times a day (QID) | ORAL | Status: DC | PRN

## 2014-09-04 MED ORDER — AMITRIPTYLINE HCL 100 MG PO TABS: 100.0000 mg | ORAL_TABLET | Freq: Every day | ORAL | Status: DC

## 2014-09-04 MED ORDER — GABAPENTIN 300 MG PO CAPS: 300.0000 mg | ORAL_CAPSULE | Freq: Three times a day (TID) | ORAL | Status: DC

## 2014-09-04 NOTE — Progress Notes (Signed)
Subjective:    Patient ID: [name], female    DOB: February 13, 1971, 44 y.o.   MRN: [id number]  HPI: [name] is a 44 year old female who returns for follow up for chronic pain and medication refill. She says her pain is located in her lower back, left hip, left buttock and bilateral lower extremities. She rates her pain 9. Her exercise regime is attending water aerobics three times a week at the [facility]   Pain Inventory Average Pain 8 Pain Right Now 9 My pain is sharp, dull, stabbing, tingling and aching  In the last 24 hours, has pain interfered with the following? General activity 8 Relation with others 8 Enjoyment of life 9 What TIME of day is your pain at its worst? all Sleep (in general) Poor  Pain is worse with: walking, bending, sitting and standing Pain improves with: medication Relief from Meds: 6  Mobility walk with assistance use a cane how many minutes can you walk? 2 ability to climb steps?  no do you drive?  no  Function not employed: date last employed . I need assistance with the following:  dressing, bathing, household duties and shopping  Neuro/Psych numbness tingling trouble walking spasms  Prior Studies Any changes since last visit?  no  Physicians involved in your care Any changes since last visit?  no   Family History  Problem Relation Age of Onset  . Anesthesia problems Neg Hx   . Hypertension Maternal Grandmother    History   Social History  . Marital Status: Divorced    Spouse Name: N/A  . Number of Children: N/A  . Years of Education: N/A   Social History Main Topics  . Smoking status: Never Smoker   . Smokeless tobacco: Never Used  . Alcohol Use: No  . Drug Use: No  . Sexual Activity: Not Currently    Birth Control/ Protection: None     Comment: IUD removed 2 wks ago.   Other Topics Concern  . None   Social History Narrative   Past Surgical History  Procedure Laterality Date  . Neck surgery    .  Cervical spine surgery  10  . Myomectomy  13  . Wisdom tooth extraction    . Cesarean section  1996  . Abdominal hysterectomy  06/29/11    Robotic Assisted Hysterectomy  . Lumbar laminectomy  08/31/2011    Procedure: MICRODISCECTOMY LUMBAR LAMINECTOMY;  Surgeon: Tobi Bastos, MD;  Location: [facility];  Service: Orthopedics;  Laterality: N/A;  lumbar five sacral one central microdisectomy  . Posterior fusion cervical spine  02/02/2013    C 5 C6     [name]  . Posterior cervical fusion/foraminotomy N/A 02/02/2013    Procedure: POSTERIOR C5-6 SPINAL FUSION;  Surgeon: Melina Schools, MD;  Location: [place];  Service: Orthopedics;  Laterality: N/A;   Past Medical History  Diagnosis Date  . DVT (deep venous thrombosis)     ? vs lupus  . Fibroids   . Headache(784.0)   . Difficult intravenous access     hands are usually best for blood draws and IV starts  . Neck pain   . Arthritis   . Anemia     hx  . Complication of anesthesia     slow to wake up,   very difficult to get iv had picc line last time  . Scoliosis    BP 121/77 mmHg  Pulse 92  Resp 14  SpO2 99%  LMP 02/27/2011  Opioid Risk Score:   Fall Risk Score: Moderate Fall Risk (6-13 points)`1  Depression screen PHQ 2/9  Depression screen [facility] 2/9 08/27/2014 06/27/2014 05/22/2014  Decreased Interest 0 3 0  Down, Depressed, Hopeless 0 1 0  PHQ - 2 Score 0 4 0  Altered sleeping - 3 -  Tired, decreased energy - 3 -  Change in appetite - 3 -  Feeling bad or failure about yourself  - 1 -  Trouble concentrating - 0 -  Moving slowly or fidgety/restless - 0 -  Suicidal thoughts - 0 -  PHQ-9 Score - 14 -     Review of Systems  Musculoskeletal: Positive for gait problem.  Neurological: Positive for numbness.       Tingling spasms  All other systems reviewed and are negative.      Objective:   Physical Exam  Constitutional: She is oriented to person, place, and time. She appears well-developed and well-nourished.  HENT:    Head: Normocephalic and atraumatic.  Neck: Normal range of motion. Neck supple.  Cardiovascular: Normal rate and regular rhythm.   Pulmonary/Chest: Effort normal and breath sounds normal.  Musculoskeletal:  Normal Muscle Bulk and Muscle Testing Reveals: Upper Extremities: Full ROM and Muscle Strength 5/5 Lumbar Paraspinal Tenderness: L-3- L-5 Lower Extremities:Right: Full ROM and Muscle Strength 5/5 Left: Decreased ROM:  Lower Extremity Flexion Produces pain into hip and lower extremity/ Arises from chair with difficulty/ Spasm's noted Antalgic Gait  Neurological: She is alert and oriented to person, place, and time.  Skin: Skin is warm and dry.  Psychiatric: She has a normal mood and affect.  Nursing note and vitals reviewed.         Assessment & Plan:  1. 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 Status post decompressive laminectomy:  Refilled: Oxycodone 10mg /325 mg 1-2 tablets every 4 hours, to #70. MS Contin 30 mg one tablet every 12 hours as needed. #60.  2. Insomnia: On elavil 100 mg hs.  3. Neuropathic pain: Continue Gabapentin to 300mg  TID  4. Muscle Spasms. Continue: Tizanidine 5. Neurogenic bowel and bladder- Continue to monitor 6. Constipation/Opioid Therapy: Movantik Samples Given. Instructed to call office on 07/30/14. 7. Left Greater Trochanteric Bursitis: RX: Medrol Dose Pak 8. Acute Exacerbation of chronic low back pain: RX: Medrol Dose pak 20 minutes of face to face patient care time was spent during this visit. All questions were encouraged and answered.   F/U in 1 month

## 2014-09-10 ENCOUNTER — Encounter: Payer: Self-pay | Admitting: Internal Medicine

## 2014-09-10 NOTE — Progress Notes (Signed)
Patient ID: [name], female   DOB: Jun 02, 1970, 44 y.o.   MRN: [id number]   [name], is a 44 y.o. female  VQM:[id number]  [id number]:[id number]  DOB - 11/22/1970  CC:  Chief Complaint  Patient presents with  . Follow-up       HPI: [name] is a 44 y.o. female here today for discussion of lab results. She is noted to have a normocytic/normochromic anemia. Pt reports an uncertain history of Lupus which was being worked up by Dr. Trudie Reed when she lost her insurance. She is not sure where the diagnosis stands with the workup.However she denies any characteristic butterfly rashes or descriptions of discoid type rashes.   She has chronic pain and is currently under the care of Dr. Naaman Plummer for pain management.  She is noted to have a vitamin D level of 8 and has been started on Vitamin D supplementation.  Patient has No headache, No chest pain, No abdominal pain - No Nausea, No new weakness tingling or numbness, No Cough - SOB.  Allergies  Allergen Reactions  . Codeine Hives  . Iodine Hives  . Penicillins Other (See Comments)    Childhood reaction  Patient denies any known problem with penicillin   02/02/13.  Marland Kitchen Shrimp [Shellfish Allergy] Hives  . Contrast Media [Iodinated Diagnostic Agents] Rash    When she had kidney x-ray as a child, pre med with benadryl  . Meperidine And Related Itching    Many pain meds make her itch, uses benadryl when necessary   Past Medical History  Diagnosis Date  . DVT (deep venous thrombosis)     ? vs lupus  . Fibroids   . Headache(784.0)   . Difficult intravenous access     hands are usually best for blood draws and IV starts  . Neck pain   . Arthritis   . Anemia     hx  . Complication of anesthesia     slow to wake up,   very difficult to get iv had picc line last time  . Scoliosis    Current Outpatient Prescriptions on File Prior to Visit  Medication Sig Dispense Refill  . cetirizine (ZYRTEC) 10 MG tablet Take 1 tablet (10 mg  total) by mouth daily. 30 tablet 3  . ergocalciferol (DRISDOL) 50000 UNITS capsule Take 1 capsule (50,000 Units total) by mouth once a week. 4 capsule 2  . Linaclotide (LINZESS) 290 MCG CAPS capsule Take 1 capsule (290 mcg total) by mouth daily. 90 capsule 3  . tiZANidine (ZANAFLEX) 4 MG tablet May take 1- 2 tablets every 8 hours as needed for muscle spasms 180 tablet 2  . [DISCONTINUED] IRON PO Take 1 tablet by mouth 2 (two) times daily.      No current facility-administered medications on file prior to visit.   Family History  Problem Relation Age of Onset  . Anesthesia problems Neg Hx   . Hypertension Maternal Grandmother    History   Social History  . Marital Status: Divorced    Spouse Name: N/A  . Number of Children: N/A  . Years of Education: N/A   Occupational History  . Not on file.   Social History Main Topics  . Smoking status: Never Smoker   . Smokeless tobacco: Never Used  . Alcohol Use: No  . Drug Use: No  . Sexual Activity: Not Currently    Birth Control/ Protection: None     Comment: IUD removed 2 wks ago.   Other  Topics Concern  . Not on file   Social History Narrative    Review of Systems: Constitutional: Negative for fever, chills, diaphoresis, activity change, appetite change and fatigue. HENT: Negative for ear pain, nosebleeds, congestion, facial swelling, rhinorrhea, neck pain, neck stiffness and ear discharge.  Eyes: Negative for pain, discharge, redness, itching and visual disturbance. Respiratory: Negative for cough, choking, chest tightness, shortness of breath, wheezing and stridor.  Cardiovascular: Negative for chest pain, palpitations and leg swelling. Gastrointestinal: Negative for abdominal distention. Genitourinary: Negative for dysuria, urgency, frequency, hematuria, flank pain, decreased urine volume, difficulty urinating and dyspareunia.  Musculoskeletal: Negative for back pain, joint swelling, arthralgia and gait  problem. Neurological: Negative for dizziness, tremors, seizures, syncope, facial asymmetry, speech difficulty, weakness, light-headedness, numbness and headaches.  Hematological: Negative for adenopathy. Does not bruise/bleed easily. Psychiatric/Behavioral: Negative for hallucinations, behavioral problems, confusion, dysphoric mood, decreased concentration and agitation.     Objective:   Filed Vitals:   08/27/14 1349  BP: 118/75  Pulse: 89  Temp: 98 F (36.7 C)  Resp: 16    Physical Exam: Constitutional: Patient appears well-developed and well-nourished. Moderately obese appearing. No distress. HENT: Normocephalic, atraumatic, External right and left ear normal. Oropharynx is clear and moist.  Eyes: Conjunctivae and EOM are normal. PERRLA, no scleral icterus. Neck: Normal ROM. Neck supple. No JVD. No tracheal deviation. No thyromegaly. CVS: RRR, S1/S2 +, no murmurs, no gallops, no carotid bruit.  Pulmonary: Effort and breath sounds normal, no stridor, rhonchi, wheezes, rales.  Abdominal: Soft. BS +, no distension, tenderness, rebound or guarding.  Musculoskeletal: Normal range of motion. No edema and no tenderness.  Lymphadenopathy: No lymphadenopathy noted, cervical, inguinal or axillary Neuro: Alert. Normal reflexes, muscle tone coordination. No cranial nerve deficit. Skin: Skin is warm and dry. No rash noted. Not diaphoretic. No erythema. No pallor. Psychiatric: Normal mood and affect. Behavior, judgment, thought content normal.   Lab Results  Component Value Date   WBC 6.8 05/28/2014   HGB 10.7* 05/28/2014   HCT 33.2* 05/28/2014   MCV 84.9 05/28/2014   PLT 262 05/28/2014   Lab Results  Component Value Date   CREATININE 0.71 05/28/2014   BUN 12 05/28/2014   NA 136 05/28/2014   K 3.7 05/28/2014   CL 103 05/28/2014   CO2 25 05/28/2014    No results found for: HGBA1C Lipid Panel     Component Value Date/Time   CHOL 129 05/28/2014 1400   TRIG 40 05/28/2014 1400    HDL 58 05/28/2014 1400   CHOLHDL 2.2 05/28/2014 1400   VLDL 8 05/28/2014 1400   LDLCALC 63 05/28/2014 1400       Assessment and plan:   1. Normocytic normochromic anemia - Pt has had a history of anemia and was at once time taking iron. However she has not taken iron for several months and still has a normocytic/normochronmic anemia. Will obtain records from Dr. Trudie Reed and make decisions regarding further work-up. - Obtain medical records  2. Obesity, morbid - Discussed exercise and diet. Pt to start aquatic exercise in the near future. Southern plate diet given to patient.  3. Metabolic Syndrome - BMI > 40. Will check Hb A1c.  Return in about 1 year (around 08/27/2015), or if symptoms worsen or fail to improve.  The patient was given clear instructions to go to ER or return to medical center if symptoms don't improve, worsen or new problems develop. The patient verbalized understanding. The patient was told to call to get lab  results if they haven't heard anything in the next week.     This note has been created with Surveyor, quantity. Any transcriptional errors are unintentional.    MATTHEWS,MICHELLE A., MD [place], [place]   09/10/2014, 11:19 AM

## 2014-10-22 ENCOUNTER — Encounter
Payer: No Typology Code available for payment source | Attending: Physical Medicine and Rehabilitation | Admitting: Registered Nurse

## 2014-10-22 ENCOUNTER — Encounter: Payer: Self-pay | Admitting: Registered Nurse

## 2014-10-22 VITALS — BP 126/69 | HR 85 | Resp 14

## 2014-10-22 DIAGNOSIS — G894 Chronic pain syndrome: Secondary | ICD-10-CM

## 2014-10-22 DIAGNOSIS — Z79899 Other long term (current) drug therapy: Secondary | ICD-10-CM

## 2014-10-22 DIAGNOSIS — M5126 Other intervertebral disc displacement, lumbar region: Secondary | ICD-10-CM | POA: Insufficient documentation

## 2014-10-22 DIAGNOSIS — M4806 Spinal stenosis, lumbar region: Secondary | ICD-10-CM | POA: Insufficient documentation

## 2014-10-22 DIAGNOSIS — Z5181 Encounter for therapeutic drug level monitoring: Secondary | ICD-10-CM

## 2014-10-22 DIAGNOSIS — M961 Postlaminectomy syndrome, not elsewhere classified: Secondary | ICD-10-CM

## 2014-10-22 DIAGNOSIS — G834 Cauda equina syndrome: Secondary | ICD-10-CM

## 2014-10-22 MED ORDER — TIZANIDINE HCL 4 MG PO TABS: ORAL_TABLET | ORAL | Status: DC

## 2014-10-22 MED ORDER — NAPROXEN SODIUM 220 MG PO TABS: 440.0000 mg | ORAL_TABLET | Freq: Two times a day (BID) | ORAL | Status: DC | PRN

## 2014-10-22 MED ORDER — OXYCODONE-ACETAMINOPHEN 10-325 MG PO TABS: ORAL_TABLET | ORAL | Status: DC

## 2014-10-22 MED ORDER — MORPHINE SULFATE ER 30 MG PO TBCR: 30.0000 mg | EXTENDED_RELEASE_TABLET | Freq: Two times a day (BID) | ORAL | Status: DC

## 2014-10-22 NOTE — Progress Notes (Signed)
Subjective:    Patient ID: [name], female    DOB: [birth year], 44 y.o.   MRN: [id number]  HPI: [name] is a 44 year old female who returns for follow up for chronic pain and medication refill. She says her pain is located in her lower back, left hip and bilateral lower extremities. She rates her pain 9. Her exercise regime is attending water aerobics three times a week at the [facility].  Also walking short distances with cane. Son in the room.  Pain Inventory Average Pain 8 Pain Right Now 9 My pain is sharp, stabbing and aching  In the last 24 hours, has pain interfered with the following? General activity 9 Relation with others 9 Enjoyment of life 9 What TIME of day is your pain at its worst? all Sleep (in general) Poor  Pain is worse with: walking, sitting and standing Pain improves with: medication Relief from Meds: 5  Mobility walk with assistance use a cane ability to climb steps?  no do you drive?  no  Function not employed: date last employed . I need assistance with the following:  dressing, household duties and shopping  Neuro/Psych numbness tingling trouble walking spasms  Prior Studies Any changes since last visit?  no  Physicians involved in your care Any changes since last visit?  no   Family History  Problem Relation Age of Onset  . Anesthesia problems Neg Hx   . Hypertension Maternal Grandmother    History   Social History  . Marital Status: Divorced    Spouse Name: N/A  . Number of Children: N/A  . Years of Education: N/A   Social History Main Topics  . Smoking status: Never Smoker   . Smokeless tobacco: Never Used  . Alcohol Use: No  . Drug Use: No  . Sexual Activity: Not Currently    Birth Control/ Protection: None     Comment: IUD removed 2 wks ago.   Other Topics Concern  . None   Social History Narrative   Past Surgical History  Procedure Laterality Date  . Neck surgery    . Cervical spine surgery  10    . Myomectomy  13  . Wisdom tooth extraction    . Cesarean section  1996  . Abdominal hysterectomy  06/29/11    Robotic Assisted Hysterectomy  . Lumbar laminectomy  08/31/2011    Procedure: MICRODISCECTOMY LUMBAR LAMINECTOMY;  Surgeon: Tobi Bastos, MD;  Location: [facility];  Service: Orthopedics;  Laterality: N/A;  lumbar five sacral one central microdisectomy  . Posterior fusion cervical spine  02/02/2013    C 5 C6     [name]  . Posterior cervical fusion/foraminotomy N/A 02/02/2013    Procedure: POSTERIOR C5-6 SPINAL FUSION;  Surgeon: Melina Schools, MD;  Location: [place];  Service: Orthopedics;  Laterality: N/A;   Past Medical History  Diagnosis Date  . DVT (deep venous thrombosis)     ? vs lupus  . Fibroids   . Headache(784.0)   . Difficult intravenous access     hands are usually best for blood draws and IV starts  . Neck pain   . Arthritis   . Anemia     hx  . Complication of anesthesia     slow to wake up,   very difficult to get iv had picc line last time  . Scoliosis    BP 126/69 mmHg  Pulse 85  Resp 14  SpO2 99%  LMP  02/27/2011  Opioid Risk Score:   Fall Risk Score:  `1  Depression screen PHQ 2/9  Depression screen [facility] 2/9 08/27/2014 06/27/2014 05/22/2014  Decreased Interest 0 3 0  Down, Depressed, Hopeless 0 1 0  PHQ - 2 Score 0 4 0  Altered sleeping - 3 -  Tired, decreased energy - 3 -  Change in appetite - 3 -  Feeling bad or failure about yourself  - 1 -  Trouble concentrating - 0 -  Moving slowly or fidgety/restless - 0 -  Suicidal thoughts - 0 -  PHQ-9 Score - 14 -     Review of Systems  Constitutional: Positive for unexpected weight change.       Night sweats Weight gain  Respiratory: Positive for cough, shortness of breath and wheezing.   Gastrointestinal: Positive for nausea, abdominal pain and constipation.  Musculoskeletal: Positive for gait problem.  Neurological: Positive for numbness.       Tingling spasms   All other systems reviewed  and are negative.      Objective:   Physical Exam  Constitutional: She is oriented to person, place, and time. She appears well-developed and well-nourished.  HENT:  Head: Normocephalic and atraumatic.  Neck: Normal range of motion. Neck supple.  Cardiovascular: Normal rate and regular rhythm.   Pulmonary/Chest: Effort normal and breath sounds normal.  Musculoskeletal:  Normal Muscle Bulk and Muscle Testing Reveals:  Upper Extremities: Full ROM and Muscle Strength 5/5 Lumbar Paraspinal Tenderness: L-3- L-5 Left Greater Trochanteric Tenderness Lower Extremities: Full ROM and Muscle Strength 5/5 Arises from chair slowly Antalgic Gait  Neurological: She is alert and oriented to person, place, and time.  Skin: Skin is warm and dry.  Psychiatric: She has a normal mood and affect.  Nursing note and vitals reviewed.         Assessment & Plan:  1. 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 Status post decompressive laminectomy:  Refilled: Oxycodone 10mg /325 mg 1-2 tablets every 4 hours, to #70. MS Contin 30 mg one tablet every 12 hours as needed. #60.  2. Insomnia: On elavil 100 mg hs.  3. Neuropathic pain: Continue Gabapentin to 300mg  TID  4. Muscle Spasms. Continue: Tizanidine 5. Neurogenic bowel and bladder- Continue to monitor 6. Constipation/Opioid Therapy:  7. Left Greater Trochanteric Bursitis: Continue with heat and ice therapy.  20 minutes of face to face patient care time was spent during this visit. All questions were encouraged and answered.   F/U in 1 month

## 2014-10-24 ENCOUNTER — Telehealth: Payer: Self-pay | Admitting: Registered Nurse

## 2014-10-24 NOTE — Telephone Encounter (Signed)
[name] wanted to know if she could have a medical massage. I spoke to Dr. Naaman Plummer today he's okay with the medical massage. I spoke to [name] and she ia aware and verbalizes understanding.

## 2014-12-10 ENCOUNTER — Encounter: Payer: No Typology Code available for payment source | Admitting: Registered Nurse

## 2015-01-07 ENCOUNTER — Encounter: Payer: Self-pay | Admitting: Registered Nurse

## 2015-01-07 ENCOUNTER — Encounter: Payer: No Typology Code available for payment source | Admitting: Physical Medicine & Rehabilitation

## 2015-01-07 ENCOUNTER — Encounter
Payer: No Typology Code available for payment source | Attending: Physical Medicine and Rehabilitation | Admitting: Registered Nurse

## 2015-01-07 VITALS — BP 126/88 | HR 96

## 2015-01-07 DIAGNOSIS — M542 Cervicalgia: Secondary | ICD-10-CM

## 2015-01-07 DIAGNOSIS — G894 Chronic pain syndrome: Secondary | ICD-10-CM

## 2015-01-07 DIAGNOSIS — M5126 Other intervertebral disc displacement, lumbar region: Secondary | ICD-10-CM | POA: Insufficient documentation

## 2015-01-07 DIAGNOSIS — M48061 Spinal stenosis, lumbar region without neurogenic claudication: Secondary | ICD-10-CM

## 2015-01-07 DIAGNOSIS — G8929 Other chronic pain: Secondary | ICD-10-CM

## 2015-01-07 DIAGNOSIS — M545 Low back pain: Secondary | ICD-10-CM

## 2015-01-07 DIAGNOSIS — M4806 Spinal stenosis, lumbar region: Secondary | ICD-10-CM | POA: Insufficient documentation

## 2015-01-07 DIAGNOSIS — Z79899 Other long term (current) drug therapy: Secondary | ICD-10-CM

## 2015-01-07 DIAGNOSIS — G834 Cauda equina syndrome: Secondary | ICD-10-CM | POA: Insufficient documentation

## 2015-01-07 DIAGNOSIS — Z5181 Encounter for therapeutic drug level monitoring: Secondary | ICD-10-CM

## 2015-01-07 DIAGNOSIS — M961 Postlaminectomy syndrome, not elsewhere classified: Secondary | ICD-10-CM

## 2015-01-07 MED ORDER — MORPHINE SULFATE ER 30 MG PO TBCR: 30.0000 mg | EXTENDED_RELEASE_TABLET | Freq: Two times a day (BID) | ORAL | Status: DC

## 2015-01-07 MED ORDER — TIZANIDINE HCL 4 MG PO TABS: ORAL_TABLET | ORAL | Status: DC

## 2015-01-07 MED ORDER — OXYCODONE-ACETAMINOPHEN 10-325 MG PO TABS: ORAL_TABLET | ORAL | Status: DC

## 2015-01-07 MED ORDER — GABAPENTIN 300 MG PO CAPS: 300.0000 mg | ORAL_CAPSULE | Freq: Three times a day (TID) | ORAL | Status: DC

## 2015-01-07 MED ORDER — NAPROXEN SODIUM 220 MG PO TABS: 440.0000 mg | ORAL_TABLET | Freq: Two times a day (BID) | ORAL | Status: DC | PRN

## 2015-01-07 MED ORDER — AMITRIPTYLINE HCL 100 MG PO TABS: 100.0000 mg | ORAL_TABLET | Freq: Every day | ORAL | Status: DC

## 2015-01-07 NOTE — Progress Notes (Signed)
Subjective:    Patient ID: [name], female    DOB: 1971-02-09, 44 y.o.   MRN: [id number]  HPI: [name] is a 44 year old female who returns for follow up for chronic pain and medication refill. She says her pain is located in her neck,lower back, left hip and bilateral lower extremities. She rates her pain 10. Her current exercise regime is walking and performing stretching exercises and walking short distances with cane. Mother in the room. Also states  she was in her bathroom few weeks ago and her bathroom floor caved in, she was able to brace herself. She had to crawl into her bedroom to pull herself up. She asked for a referral to see Dr. Rolena Infante regarding her neck pain she has seen him in the past. Referral placed.  Pain Inventory Average Pain 8 Pain Right Now 10 My pain is sharp, burning, tingling and aching  In the last 24 hours, has pain interfered with the following? General activity 10 Relation with others 10 Enjoyment of life 10 What TIME of day is your pain at its worst? 24/7 Sleep (in general) Poor  Pain is worse with: walking, bending, sitting and standing Pain improves with: medication Relief from Meds: 6  Mobility use a cane ability to climb steps?  no do you drive?  no Do you have any goals in this area?  yes  Function not employed: date last employed 06/13 I need assistance with the following:  toileting, household duties and shopping Do you have any goals in this area?  yes  Neuro/Psych weakness numbness tingling trouble walking spasms dizziness depression  Prior Studies Any changes since last visit?  no  Physicians involved in your care n/a   Family History  Problem Relation Age of Onset  . Anesthesia problems Neg Hx   . Hypertension Maternal Grandmother    Social History   Social History  . Marital Status: Divorced    Spouse Name: N/A  . Number of Children: N/A  . Years of Education: N/A   Social History Main  Topics  . Smoking status: Never Smoker   . Smokeless tobacco: Never Used  . Alcohol Use: No  . Drug Use: No  . Sexual Activity: Not Currently    Birth Control/ Protection: None     Comment: IUD removed 2 wks ago.   Other Topics Concern  . None   Social History Narrative   Past Surgical History  Procedure Laterality Date  . Neck surgery    . Cervical spine surgery  10  . Myomectomy  13  . Wisdom tooth extraction    . Cesarean section  1996  . Abdominal hysterectomy  06/29/11    Robotic Assisted Hysterectomy  . Lumbar laminectomy  08/31/2011    Procedure: MICRODISCECTOMY LUMBAR LAMINECTOMY;  Surgeon: Tobi Bastos, MD;  Location: [facility];  Service: Orthopedics;  Laterality: N/A;  lumbar five sacral one central microdisectomy  . Posterior fusion cervical spine  02/02/2013    C 5 C6     [name]  . Posterior cervical fusion/foraminotomy N/A 02/02/2013    Procedure: POSTERIOR C5-6 SPINAL FUSION;  Surgeon: Melina Schools, MD;  Location: [place];  Service: Orthopedics;  Laterality: N/A;   Past Medical History  Diagnosis Date  . DVT (deep venous thrombosis) ([place])     ? vs lupus  . Fibroids   . Headache(784.0)   . Difficult intravenous access     hands are usually best  for blood draws and IV starts  . Neck pain   . Arthritis   . Anemia     hx  . Complication of anesthesia     slow to wake up,   very difficult to get iv had picc line last time  . Scoliosis    BP 126/88 mmHg  Pulse 96  SpO2 98%  LMP 02/27/2011  Opioid Risk Score:   Fall Risk Score:  `1  Depression screen PHQ 2/9  Depression screen [facility] 2/9 08/27/2014 06/27/2014 05/22/2014  Decreased Interest 0 3 0  Down, Depressed, Hopeless 0 1 0  PHQ - 2 Score 0 4 0  Altered sleeping - 3 -  Tired, decreased energy - 3 -  Change in appetite - 3 -  Feeling bad or failure about yourself  - 1 -  Trouble concentrating - 0 -  Moving slowly or fidgety/restless - 0 -  Suicidal thoughts - 0 -  PHQ-9 Score - 14 -     Review of  Systems  Constitutional: Positive for unexpected weight change. Negative for appetite change.  Respiratory: Positive for apnea, cough and shortness of breath.   Gastrointestinal: Positive for nausea, vomiting, diarrhea and constipation.  All other systems reviewed and are negative.      Objective:   Physical Exam  Constitutional: She is oriented to person, place, and time. She appears well-developed and well-nourished.  HENT:  Head: Normocephalic and atraumatic.  Neck: Normal range of motion. Neck supple.  Cardiovascular: Normal rate and regular rhythm.   Pulmonary/Chest: Effort normal.  Rhonchi   Musculoskeletal:  Normal Muscle Bulk and Muscle Testing Reveals: Upper Extremities: Full ROM and Muscle Strength 5/5 Thoracic Paraspinal Tenderness: T-1- T-3 Lumbar Paraspinal Tenderness: L-3- L-5 Lower Extremities: Full ROM and Muscle Strength 5/5 Arises from chair with ease Antalgic Based Gait  Neurological: She is alert and oriented to person, place, and time.  Skin: Skin is warm and dry.  Psychiatric: She has a normal mood and affect.  Nursing note and vitals reviewed.         Assessment & Plan:  1. 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 Status post decompressive laminectomy:  Refilled: Oxycodone 10mg /325 mg 1-2 tablets every 4 hours, to #70. MS Contin 30 mg one tablet every 12 hours as needed. #60. Second script given to accommodate scheduled appointment. 2. Insomnia: On elavil 100 mg hs.  3. Neuropathic pain: Continue Gabapentin to 300mg  TID  4. Muscle Spasms. Continue: Tizanidine 5. Neurogenic bowel and bladder- Continue to monitor 6. Constipation/Opioid Therapy: Continue  Linzess 7. Left Greater Trochanteric Bursitis: Continue with heat and ice therapy. Schedule Cortisone Injection with Dr. Naaman Plummer  20 minutes of face to face patient care time was spent during this visit. All questions were encouraged and answered.   F/U in 1 month

## 2015-01-08 ENCOUNTER — Other Ambulatory Visit: Payer: Self-pay | Admitting: Physical Medicine & Rehabilitation

## 2015-01-09 LAB — PMP ALCOHOL METABOLITE (ETG): Ethyl Glucuronide (EtG): NEGATIVE ng/mL

## 2015-01-12 LAB — OXYCODONE, URINE (LC/MS-MS)
NOROXYCODONE, UR: 8961 ng/mL (ref <50)
OXYMORPHONE, URINE: 3222 ng/mL (ref <50)
Oxycodone, ur: 3165 ng/mL (ref <50)

## 2015-01-12 LAB — AMPHETAMINES (GC/LC/MS), URINE
Amphetamine GC/MS Conf: NEGATIVE ng/mL (ref <250)
METHAMPHETAMINE QUANT UR: NEGATIVE ng/mL (ref <250)

## 2015-01-12 LAB — OPIATES/OPIOIDS (LC/MS-MS)
Codeine Urine: NEGATIVE ng/mL (ref <50)
HYDROCODONE: NEGATIVE ng/mL (ref <50)
HYDROMORPHONE: NEGATIVE ng/mL — AB (ref <50)
MORPHINE: NEGATIVE ng/mL — AB (ref <50)
Norhydrocodone, Ur: NEGATIVE ng/mL (ref <50)
Noroxycodone, Ur: 8961 ng/mL (ref <50)
Oxycodone, ur: 3165 ng/mL (ref <50)
Oxymorphone: 3222 ng/mL (ref <50)

## 2015-01-15 LAB — PRESCRIPTION MONITORING PROFILE (SOLSTAS)
Barbiturate Screen, Urine: NEGATIVE ng/mL
Benzodiazepine Screen, Urine: NEGATIVE ng/mL
Buprenorphine, Urine: NEGATIVE ng/mL
CANNABINOID SCRN UR: NEGATIVE ng/mL
CARISOPRODOL, URINE: NEGATIVE ng/mL
Cocaine Metabolites: NEGATIVE ng/mL
Creatinine, Urine: 520.11 mg/dL (ref >20.0)
ECSTASY: NEGATIVE ng/mL
FENTANYL URINE: NEGATIVE ng/mL
METHADONE SCREEN, URINE: NEGATIVE ng/mL
Meperidine, Ur: NEGATIVE ng/mL
Nitrites, Initial: NEGATIVE ug/mL
Propoxyphene: NEGATIVE ng/mL
Tapentadol, urine: NEGATIVE ng/mL
Tramadol Scrn, Ur: NEGATIVE ng/mL
Zolpidem, Urine: NEGATIVE ng/mL
pH, Initial: 6.5 pH (ref 4.5–8.9)

## 2015-01-31 NOTE — Progress Notes (Signed)
Urine drug screen for this encounter is consistent for prescribed medication 

## 2015-02-12 ENCOUNTER — Encounter
Payer: No Typology Code available for payment source | Attending: Physical Medicine and Rehabilitation | Admitting: Physical Medicine & Rehabilitation

## 2015-02-12 ENCOUNTER — Encounter: Payer: Self-pay | Admitting: Physical Medicine & Rehabilitation

## 2015-02-12 VITALS — BP 123/75 | HR 114

## 2015-02-12 DIAGNOSIS — M4806 Spinal stenosis, lumbar region: Secondary | ICD-10-CM | POA: Insufficient documentation

## 2015-02-12 DIAGNOSIS — M5126 Other intervertebral disc displacement, lumbar region: Secondary | ICD-10-CM | POA: Insufficient documentation

## 2015-02-12 DIAGNOSIS — G834 Cauda equina syndrome: Secondary | ICD-10-CM | POA: Insufficient documentation

## 2015-02-12 DIAGNOSIS — M7061 Trochanteric bursitis, right hip: Secondary | ICD-10-CM

## 2015-02-12 DIAGNOSIS — M7062 Trochanteric bursitis, left hip: Secondary | ICD-10-CM

## 2015-02-12 DIAGNOSIS — J209 Acute bronchitis, unspecified: Secondary | ICD-10-CM

## 2015-02-12 MED ORDER — AZITHROMYCIN 250 MG PO TABS: ORAL_TABLET | ORAL | Status: DC

## 2015-02-12 NOTE — Patient Instructions (Signed)
PLEASE CALL ME WITH ANY PROBLEMS OR QUESTIONS (#[phone]). HAVE A HAPPY HOLIDAY SEASON!!!    

## 2015-02-12 NOTE — Progress Notes (Signed)
[name] is primarily here for an injection today. She has had increased pain in both of her hips since she feel through her bathroom floor in October. She is also struggling with an ongoing productive cough since [facility] October as well. She has fevers at times and the secretions are often yellowish or green. She is using robitussin and her inhalers but nothing seems to be helping.   Chest: productive wet sounding cough. Rhonchi on auscultation  Plan: 1. Bilateral greater troch bursitis  -After informed consent and preparation of the skin with betadine and isopropyl alcohol, I injected 6mg  (1cc) of celestone and 4cc of 1% lidocaine around  the bilateral greater trochanters via lateral approach. Additionally, aspiration was performed prior to injection. The patient tolerated well, and no complications were encountered. Afterward the area was cleaned and dressed. Post- injection instructions were provided.   2. Acute bronchitis: continue symptomatic mgt  -rxed Zpak  -if no improvement over the next week, she needs to see her primary  She does not need med refills today We'll seee her back in about a month. Fifteen minutes of face to face patient care time were spent during this visit. All questions were encouraged and answered.

## 2015-03-14 ENCOUNTER — Encounter: Payer: Self-pay | Admitting: Registered Nurse

## 2015-03-14 ENCOUNTER — Encounter
Payer: No Typology Code available for payment source | Attending: Physical Medicine and Rehabilitation | Admitting: Registered Nurse

## 2015-03-14 VITALS — BP 132/75 | HR 90 | Resp 14

## 2015-03-14 DIAGNOSIS — M4806 Spinal stenosis, lumbar region: Secondary | ICD-10-CM | POA: Insufficient documentation

## 2015-03-14 DIAGNOSIS — M25511 Pain in right shoulder: Secondary | ICD-10-CM

## 2015-03-14 DIAGNOSIS — G8929 Other chronic pain: Secondary | ICD-10-CM

## 2015-03-14 DIAGNOSIS — M545 Low back pain, unspecified: Secondary | ICD-10-CM

## 2015-03-14 DIAGNOSIS — F32A Depression, unspecified: Secondary | ICD-10-CM

## 2015-03-14 DIAGNOSIS — Z5181 Encounter for therapeutic drug level monitoring: Secondary | ICD-10-CM

## 2015-03-14 DIAGNOSIS — G834 Cauda equina syndrome: Secondary | ICD-10-CM | POA: Insufficient documentation

## 2015-03-14 DIAGNOSIS — F329 Major depressive disorder, single episode, unspecified: Secondary | ICD-10-CM

## 2015-03-14 DIAGNOSIS — M961 Postlaminectomy syndrome, not elsewhere classified: Secondary | ICD-10-CM

## 2015-03-14 DIAGNOSIS — M7062 Trochanteric bursitis, left hip: Secondary | ICD-10-CM

## 2015-03-14 DIAGNOSIS — M5126 Other intervertebral disc displacement, lumbar region: Secondary | ICD-10-CM | POA: Insufficient documentation

## 2015-03-14 DIAGNOSIS — M62830 Muscle spasm of back: Secondary | ICD-10-CM

## 2015-03-14 DIAGNOSIS — M5416 Radiculopathy, lumbar region: Secondary | ICD-10-CM

## 2015-03-14 DIAGNOSIS — Z79899 Other long term (current) drug therapy: Secondary | ICD-10-CM

## 2015-03-14 DIAGNOSIS — T402X5A Adverse effect of other opioids, initial encounter: Secondary | ICD-10-CM

## 2015-03-14 DIAGNOSIS — G894 Chronic pain syndrome: Secondary | ICD-10-CM

## 2015-03-14 DIAGNOSIS — K5903 Drug induced constipation: Secondary | ICD-10-CM

## 2015-03-14 DIAGNOSIS — M7061 Trochanteric bursitis, right hip: Secondary | ICD-10-CM

## 2015-03-14 MED ORDER — MORPHINE SULFATE ER 30 MG PO TBCR: 30.0000 mg | EXTENDED_RELEASE_TABLET | Freq: Two times a day (BID) | ORAL | Status: DC

## 2015-03-14 MED ORDER — OXYCODONE-ACETAMINOPHEN 10-325 MG PO TABS: 1.0000 | ORAL_TABLET | Freq: Three times a day (TID) | ORAL | Status: DC | PRN

## 2015-03-14 MED ORDER — OXYCODONE-ACETAMINOPHEN 10-325 MG PO TABS: ORAL_TABLET | ORAL | Status: DC

## 2015-03-14 MED ORDER — TIZANIDINE HCL 4 MG PO TABS: ORAL_TABLET | ORAL | Status: DC

## 2015-03-14 MED ORDER — CITALOPRAM HYDROBROMIDE 10 MG PO TABS: 10.0000 mg | ORAL_TABLET | Freq: Every day | ORAL | Status: DC

## 2015-03-14 NOTE — Progress Notes (Signed)
Subjective:    Patient ID: [name], female    DOB: 13-Nov-1970, 43 y.o.   MRN: [id number]  HPI: [name] is a 44 year old female who returns for follow up for chronic pain and medication refill. She says her pain is located in her right shoulder, lower back radiating into her bilateral hips and bilateral lower extremities anteriorly and posteriorly. Also states her pain has intensified with weather changes we will increase her tablets this month and re-evaluate next month she verbalizes understanding. She rates her pain 8. Her current exercise regime is walking and performing stretching exercises and walking short distances with cane. Son in the room and states him mom ( [name]) stays in the bed, she admits to being depressed, very tearful. She states "she wants her life back" she denies suicidal ideation. Ordered placed for Psychology, Celexa ordered she verbalizes understanding. Also states her legs are becoming weak as she stands in shower asked if she can have a shower chair. Office staff trying to assist with shower chair due to her insurance is the orange card, she verbalizes understanding. Admits to having financial difficulties.  S/P Bilateral Greater Trochanteric Steroid Injection with good relief noted.  Pain Inventory Average Pain 8 Pain Right Now 8 My pain is sharp, stabbing and aching  In the last 24 hours, has pain interfered with the following? General activity 3 Relation with others 7 Enjoyment of life 7 What TIME of day is your pain at its worst? morning, daytime, evening, night Sleep (in general) Poor  Pain is worse with: walking, bending, sitting and standing Pain improves with: NA Relief from Meds: 5  Mobility use a cane ability to climb steps?  no do you drive?  no  Function not employed: date last employed . I need assistance with the following:  dressing, bathing, household duties and shopping  Neuro/Psych numbness tingling trouble  walking spasms dizziness depression anxiety  Prior Studies Any changes since last visit?  no  Physicians involved in your care Any changes since last visit?  no   Family History  Problem Relation Age of Onset  . Anesthesia problems Neg Hx   . Hypertension Maternal Grandmother    Social History   Social History  . Marital Status: Divorced    Spouse Name: N/A  . Number of Children: N/A  . Years of Education: N/A   Social History Main Topics  . Smoking status: Never Smoker   . Smokeless tobacco: Never Used  . Alcohol Use: No  . Drug Use: No  . Sexual Activity: Not Currently    Birth Control/ Protection: None     Comment: IUD removed 2 wks ago.   Other Topics Concern  . None   Social History Narrative   Past Surgical History  Procedure Laterality Date  . Neck surgery    . Cervical spine surgery  10  . Myomectomy  13  . Wisdom tooth extraction    . Cesarean section  1996  . Abdominal hysterectomy  06/29/11    Robotic Assisted Hysterectomy  . Lumbar laminectomy  08/31/2011    Procedure: MICRODISCECTOMY LUMBAR LAMINECTOMY;  Surgeon: Tobi Bastos, MD;  Location: [facility];  Service: Orthopedics;  Laterality: N/A;  lumbar five sacral one central microdisectomy  . Posterior fusion cervical spine  02/02/2013    C 5 C6     [name]  . Posterior cervical fusion/foraminotomy N/A 02/02/2013    Procedure: POSTERIOR C5-6 SPINAL FUSION;  Surgeon: Melina Schools, MD;  Location: [place];  Service: Orthopedics;  Laterality: N/A;   Past Medical History  Diagnosis Date  . DVT (deep venous thrombosis) ([place])     ? vs lupus  . Fibroids   . Headache(784.0)   . Difficult intravenous access     hands are usually best for blood draws and IV starts  . Neck pain   . Arthritis   . Anemia     hx  . Complication of anesthesia     slow to wake up,   very difficult to get iv had picc line last time  . Scoliosis    BP 132/75 mmHg  Pulse 90  Resp 14  SpO2 97%  LMP  02/27/2011  Opioid Risk Score:   Fall Risk Score:  `1  Depression screen PHQ 2/9  Depression screen [facility] 2/9 08/27/2014 06/27/2014 05/22/2014  Decreased Interest 0 3 0  Down, Depressed, Hopeless 0 1 0  PHQ - 2 Score 0 4 0  Altered sleeping - 3 -  Tired, decreased energy - 3 -  Change in appetite - 3 -  Feeling bad or failure about yourself  - 1 -  Trouble concentrating - 0 -  Moving slowly or fidgety/restless - 0 -  Suicidal thoughts - 0 -  PHQ-9 Score - 14 -     Review of Systems  Constitutional: Positive for diaphoresis, appetite change and unexpected weight change.  Respiratory: Positive for cough.   Cardiovascular:       Limb swelling  Gastrointestinal: Positive for nausea, abdominal pain and constipation.  Musculoskeletal: Positive for gait problem.  Neurological: Positive for dizziness and numbness.       Tingling spasms  Psychiatric/Behavioral: Positive for dysphoric mood. The patient is nervous/anxious.   All other systems reviewed and are negative.      Objective:   Physical Exam  Constitutional: She is oriented to person, place, and time. She appears well-developed and well-nourished.  HENT:  Head: Normocephalic and atraumatic.  Neck: Normal range of motion. Neck supple.  Cardiovascular: Normal rate and regular rhythm.   Pulmonary/Chest: Effort normal and breath sounds normal.  Musculoskeletal:  Normal Muscle Bulk and Muscle Testing Reveals: Upper Extremities: Right: Decreased ROM 90 Degrees and Muscle Strength 4/5 Right AC Joint Tenderness Left: Full ROM and Muscle Strength 5/5 Thoracic and Lumbar Hypersensitivity Lower Extremities: Full ROM and Muscle Strength 5/5 Arises from chair slowly using straight cane for support Antalgic Gait  Neurological: She is alert and oriented to person, place, and time.  Skin: Skin is warm and dry.  Psychiatric: She has a normal mood and affect.  Nursing note and vitals reviewed.         Assessment & Plan:  1. Cauda  equina syndrome due to extruded L5-S1 disc. Status post decompressive laminectomy:  Refilled: Oxycodone 10mg /325 mg 1 tablet every 8 hours,increased to #100. MS Contin 30 mg one tablet every 12 hours as needed. #60. 2. Insomnia: On elavil 100 mg hs.  3. Neuropathic pain: Continue Gabapentin to 300mg  TID  4. Muscle Spasms. Continue: Tizanidine 5. Neurogenic bowel and bladder- Continue to monitor 6. Constipation/Opioid Therapy: Continue Linzess 7. Depression: Referral to Psychology. RX: Celexa 8. Acute exacerbation of chronic low back pain: Oxycodone tablets increased: Instructed to call office next week for evaluation in medication changes.  20 minutes of face to face patient care time was spent during this visit. All questions were encouraged and answered.   F/U in 1 month

## 2015-04-16 ENCOUNTER — Encounter
Payer: No Typology Code available for payment source | Attending: Physical Medicine and Rehabilitation | Admitting: Registered Nurse

## 2015-04-16 ENCOUNTER — Encounter: Payer: Self-pay | Admitting: Registered Nurse

## 2015-04-16 VITALS — BP 130/86 | HR 90

## 2015-04-16 DIAGNOSIS — M5126 Other intervertebral disc displacement, lumbar region: Secondary | ICD-10-CM | POA: Insufficient documentation

## 2015-04-16 DIAGNOSIS — M48061 Spinal stenosis, lumbar region without neurogenic claudication: Secondary | ICD-10-CM

## 2015-04-16 DIAGNOSIS — G834 Cauda equina syndrome: Secondary | ICD-10-CM | POA: Insufficient documentation

## 2015-04-16 DIAGNOSIS — M961 Postlaminectomy syndrome, not elsewhere classified: Secondary | ICD-10-CM

## 2015-04-16 DIAGNOSIS — G894 Chronic pain syndrome: Secondary | ICD-10-CM

## 2015-04-16 DIAGNOSIS — M4806 Spinal stenosis, lumbar region: Secondary | ICD-10-CM | POA: Insufficient documentation

## 2015-04-16 DIAGNOSIS — M7062 Trochanteric bursitis, left hip: Secondary | ICD-10-CM

## 2015-04-16 DIAGNOSIS — M7061 Trochanteric bursitis, right hip: Secondary | ICD-10-CM

## 2015-04-16 DIAGNOSIS — M5416 Radiculopathy, lumbar region: Secondary | ICD-10-CM

## 2015-04-16 MED ORDER — AMITRIPTYLINE HCL 100 MG PO TABS: 100.0000 mg | ORAL_TABLET | Freq: Every day | ORAL | Status: DC

## 2015-04-16 MED ORDER — GABAPENTIN 300 MG PO CAPS: 300.0000 mg | ORAL_CAPSULE | Freq: Three times a day (TID) | ORAL | Status: DC

## 2015-04-16 MED ORDER — MORPHINE SULFATE ER 30 MG PO TBCR: 30.0000 mg | EXTENDED_RELEASE_TABLET | Freq: Two times a day (BID) | ORAL | Status: DC

## 2015-04-16 MED ORDER — OXYCODONE-ACETAMINOPHEN 10-325 MG PO TABS: 1.0000 | ORAL_TABLET | Freq: Three times a day (TID) | ORAL | Status: DC | PRN

## 2015-04-16 NOTE — Progress Notes (Signed)
Subjective:    Patient ID: [name], female    DOB: [birth year], 45 y.o.   MRN: [id number]  HPI: [name] is a 45 year old female who returns for follow up for chronic pain and medication refill. She says her pain is located in her  lower back mainly right side radiating into her right lower extremity posteriorly. She rates her pain 7. Her current exercise regime is walking and performing stretching exercises and walking short distances with cane. Also states while exiting off the [employer] bus her oxycodone fell on the ground, she was able to pick some of her tablets up. Will allow her to re-fill oxycodone on 04/17/2015 she verbalizes understanding. She states" she had 20 tablets in the bottle". She states her lower back pain  ( mainly right side) has increased with her bending to pick up her tablets, she was escorted to lobby via wheelchair.  Pain Inventory Average Pain 7 Pain Right Now 7 My pain is sharp, tingling and aching  In the last 24 hours, has pain interfered with the following? General activity 7 Relation with others 8 Enjoyment of life 8 What TIME of day is your pain at its worst? all Sleep (in general) Poor  Pain is worse with: walking, bending, sitting and standing Pain improves with: medication Relief from Meds: 3  Mobility walk with assistance use a cane ability to climb steps?  no do you drive?  no  Function I need assistance with the following:  dressing, bathing, household duties and shopping  Neuro/Psych numbness tingling trouble walking spasms depression  Prior Studies Any changes since last visit?  no  Physicians involved in your care Any changes since last visit?  no   Family History  Problem Relation Age of Onset  . Anesthesia problems Neg Hx   . Hypertension Maternal Grandmother    Social History   Social History  . Marital Status: Divorced    Spouse Name: N/A  . Number of Children: N/A  . Years of Education: N/A    Social History Main Topics  . Smoking status: Never Smoker   . Smokeless tobacco: Never Used  . Alcohol Use: No  . Drug Use: No  . Sexual Activity: Not Currently    Birth Control/ Protection: None     Comment: IUD removed 2 wks ago.   Other Topics Concern  . None   Social History Narrative   Past Surgical History  Procedure Laterality Date  . Neck surgery    . Cervical spine surgery  10  . Myomectomy  13  . Wisdom tooth extraction    . Cesarean section  1996  . Abdominal hysterectomy  06/29/11    Robotic Assisted Hysterectomy  . Lumbar laminectomy  08/31/2011    Procedure: MICRODISCECTOMY LUMBAR LAMINECTOMY;  Surgeon: Tobi Bastos, MD;  Location: [facility];  Service: Orthopedics;  Laterality: N/A;  lumbar five sacral one central microdisectomy  . Posterior fusion cervical spine  02/02/2013    C 5 C6     [name]  . Posterior cervical fusion/foraminotomy N/A 02/02/2013    Procedure: POSTERIOR C5-6 SPINAL FUSION;  Surgeon: Melina Schools, MD;  Location: [place];  Service: Orthopedics;  Laterality: N/A;   Past Medical History  Diagnosis Date  . DVT (deep venous thrombosis) ([place])     ? vs lupus  . Fibroids   . Headache(784.0)   . Difficult intravenous access     hands are usually best for blood  draws and IV starts  . Neck pain   . Arthritis   . Anemia     hx  . Complication of anesthesia     slow to wake up,   very difficult to get iv had picc line last time  . Scoliosis    BP 130/86 mmHg  Pulse 90  SpO2 100%  LMP 02/27/2011  Opioid Risk Score:   Fall Risk Score:  `1  Depression screen PHQ 2/9  Depression screen [facility] 2/9 08/27/2014 06/27/2014 05/22/2014  Decreased Interest 0 3 0  Down, Depressed, Hopeless 0 1 0  PHQ - 2 Score 0 4 0  Altered sleeping - 3 -  Tired, decreased energy - 3 -  Change in appetite - 3 -  Feeling bad or failure about yourself  - 1 -  Trouble concentrating - 0 -  Moving slowly or fidgety/restless - 0 -  Suicidal thoughts - 0 -  PHQ-9  Score - 14 -    Review of Systems  Constitutional: Positive for diaphoresis and unexpected weight change.  Respiratory: Positive for cough.   Cardiovascular: Positive for leg swelling.  Gastrointestinal: Positive for nausea, abdominal pain and constipation.  All other systems reviewed and are negative.      Objective:   Physical Exam  Constitutional: She is oriented to person, place, and time. She appears well-developed and well-nourished.  HENT:  Head: Normocephalic and atraumatic.  Neck: Normal range of motion. Neck supple.  Cardiovascular: Normal rate and regular rhythm.   Pulmonary/Chest: Effort normal and breath sounds normal.  Musculoskeletal:  Normal Muscle Bulk and Muscle testing Reveals: Upper Extremities: Left:Full ROM and Muscle Strength 5/5 Right: Decreased ROM 90 Degrees and Muscle Strength 5/5 Lumbar Paraspinal Tenderness: L-3- L-5 Lower Extremities: Full ROM and Muscle Strength 5/5 Arises from chair slowly using straight cane for support. Transfer to wheelchair and escorted to lobby via staff  Neurological: She is alert and oriented to person, place, and time.  Skin: Skin is warm and dry.  Psychiatric: She has a normal mood and affect.  Nursing note and vitals reviewed.         Assessment & Plan:  1. 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 Status post decompressive laminectomy:  Refilled: Oxycodone 10mg /325 mg 1 tablet every 8 hours, #100. MS Contin 30 mg one tablet every 12 hours as needed. #60. 2. Insomnia: On elavil 100 mg hs.  3. Neuropathic pain: Continue Gabapentin to 300mg  TID  4. Muscle Spasms. Continue: Tizanidine 5. Neurogenic bowel and bladder- Continue to monitor 6. Constipation/Opioid Therapy: Continue Linzess 7. Depression: Referral to Psychology.Continue Celexa: Awaiting appointment with Psychiatry.  20 minutes of face to face patient care time was spent during this visit. All questions were encouraged and answered.   F/U  in 1 month

## 2015-04-18 MED FILL — OXYCODONE-ACETAMINOPHEN 10-: 10-325 | 33 days supply | Qty: 100 | Fill #0

## 2015-04-18 MED FILL — MORPHINE SULF ER 30 MG TAB: 30 | 30 days supply | Qty: 60 | Fill #0

## 2015-04-24 ENCOUNTER — Telehealth: Payer: Self-pay | Admitting: Registered Nurse

## 2015-04-24 NOTE — Telephone Encounter (Signed)
Placed a call to Ms. Mcleish, wanted to give her information regarding orange card as it relates to counseling. Await a call back.

## 2015-05-08 ENCOUNTER — Emergency Department (HOSPITAL_COMMUNITY)
Admission: EM | Admit: 2015-05-08 | Discharge: 2015-05-08 | Disposition: A | Discharge status: Home / Self Care | Payer: No Typology Code available for payment source | Attending: Emergency Medicine | Admitting: Emergency Medicine

## 2015-05-08 ENCOUNTER — Encounter (HOSPITAL_COMMUNITY): Payer: Self-pay | Admitting: Emergency Medicine

## 2015-05-08 DIAGNOSIS — M1990 Unspecified osteoarthritis, unspecified site: Secondary | ICD-10-CM | POA: Insufficient documentation

## 2015-05-08 DIAGNOSIS — Z862 Personal history of diseases of the blood and blood-forming organs and certain disorders involving the immune mechanism: Secondary | ICD-10-CM | POA: Insufficient documentation

## 2015-05-08 DIAGNOSIS — Z79899 Other long term (current) drug therapy: Secondary | ICD-10-CM | POA: Insufficient documentation

## 2015-05-08 DIAGNOSIS — M419 Scoliosis, unspecified: Secondary | ICD-10-CM | POA: Insufficient documentation

## 2015-05-08 DIAGNOSIS — Z86718 Personal history of other venous thrombosis and embolism: Secondary | ICD-10-CM | POA: Insufficient documentation

## 2015-05-08 DIAGNOSIS — S3992XA Unspecified injury of lower back, initial encounter: Secondary | ICD-10-CM | POA: Insufficient documentation

## 2015-05-08 DIAGNOSIS — S199XXA Unspecified injury of neck, initial encounter: Secondary | ICD-10-CM | POA: Insufficient documentation

## 2015-05-08 DIAGNOSIS — Y998 Other external cause status: Secondary | ICD-10-CM | POA: Insufficient documentation

## 2015-05-08 DIAGNOSIS — S0990XA Unspecified injury of head, initial encounter: Secondary | ICD-10-CM | POA: Insufficient documentation

## 2015-05-08 DIAGNOSIS — Z86018 Personal history of other benign neoplasm: Secondary | ICD-10-CM | POA: Insufficient documentation

## 2015-05-08 DIAGNOSIS — Y92410 Unspecified street and highway as the place of occurrence of the external cause: Secondary | ICD-10-CM | POA: Insufficient documentation

## 2015-05-08 DIAGNOSIS — S8011XA Contusion of right lower leg, initial encounter: Secondary | ICD-10-CM | POA: Insufficient documentation

## 2015-05-08 DIAGNOSIS — Y9389 Activity, other specified: Secondary | ICD-10-CM | POA: Insufficient documentation

## 2015-05-08 DIAGNOSIS — S8992XA Unspecified injury of left lower leg, initial encounter: Secondary | ICD-10-CM | POA: Insufficient documentation

## 2015-05-08 DIAGNOSIS — Z880 Allergy status to penicillin: Secondary | ICD-10-CM | POA: Insufficient documentation

## 2015-05-08 MED ORDER — IBUPROFEN 800 MG PO TABS: 800.0000 mg | ORAL_TABLET | Freq: Three times a day (TID) | ORAL | Status: DC

## 2015-05-08 MED ORDER — METHOCARBAMOL 500 MG PO TABS: 500.0000 mg | ORAL_TABLET | Freq: Two times a day (BID) | ORAL | Status: DC

## 2015-05-08 NOTE — Discharge Instructions (Signed)
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
## 2015-05-14 ENCOUNTER — Encounter
Payer: No Typology Code available for payment source | Attending: Physical Medicine and Rehabilitation | Admitting: Registered Nurse

## 2015-05-14 ENCOUNTER — Encounter: Payer: Self-pay | Admitting: Registered Nurse

## 2015-05-14 VITALS — BP 131/79 | HR 97

## 2015-05-14 DIAGNOSIS — M7061 Trochanteric bursitis, right hip: Secondary | ICD-10-CM

## 2015-05-14 DIAGNOSIS — M62830 Muscle spasm of back: Secondary | ICD-10-CM

## 2015-05-14 DIAGNOSIS — G834 Cauda equina syndrome: Secondary | ICD-10-CM

## 2015-05-14 DIAGNOSIS — M48061 Spinal stenosis, lumbar region without neurogenic claudication: Secondary | ICD-10-CM

## 2015-05-14 DIAGNOSIS — Z5181 Encounter for therapeutic drug level monitoring: Secondary | ICD-10-CM

## 2015-05-14 DIAGNOSIS — G894 Chronic pain syndrome: Secondary | ICD-10-CM

## 2015-05-14 DIAGNOSIS — Z79899 Other long term (current) drug therapy: Secondary | ICD-10-CM

## 2015-05-14 DIAGNOSIS — M7062 Trochanteric bursitis, left hip: Secondary | ICD-10-CM

## 2015-05-14 DIAGNOSIS — M4806 Spinal stenosis, lumbar region: Secondary | ICD-10-CM

## 2015-05-14 DIAGNOSIS — M5126 Other intervertebral disc displacement, lumbar region: Secondary | ICD-10-CM | POA: Insufficient documentation

## 2015-05-14 DIAGNOSIS — F329 Major depressive disorder, single episode, unspecified: Secondary | ICD-10-CM

## 2015-05-14 DIAGNOSIS — M961 Postlaminectomy syndrome, not elsewhere classified: Secondary | ICD-10-CM

## 2015-05-14 DIAGNOSIS — F32A Depression, unspecified: Secondary | ICD-10-CM

## 2015-05-14 MED ORDER — MORPHINE SULFATE ER 30 MG PO TBCR: 30.0000 mg | EXTENDED_RELEASE_TABLET | Freq: Two times a day (BID) | ORAL | Status: DC

## 2015-05-14 MED ORDER — OXYCODONE-ACETAMINOPHEN 10-325 MG PO TABS: 1.0000 | ORAL_TABLET | Freq: Three times a day (TID) | ORAL | Status: DC | PRN

## 2015-05-14 MED ORDER — TIZANIDINE HCL 4 MG PO TABS: ORAL_TABLET | ORAL | Status: DC

## 2015-05-14 MED ORDER — CITALOPRAM HYDROBROMIDE 10 MG PO TABS: 10.0000 mg | ORAL_TABLET | Freq: Every day | ORAL | Status: DC
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
## 2015-05-19 LAB — TOXASSURE SELECT,+ANTIDEPR,UR: PDF: 0

## 2015-05-19 LAB — 6-ACETYLMORPHINE,TOXASSURE ADD
6-ACETYLMORPHINE: NEGATIVE
6-acetylmorphine: NOT DETECTED ng/mg creat

## 2015-05-20 MED FILL — MORPHINE SULF ER 30 MG TAB: 30 | 30 days supply | Qty: 60 | Fill #0

## 2015-05-20 MED FILL — OXYCODONE-APAP 10-325 TAB: 10-325 | 25 days supply | Qty: 100 | Fill #0

## 2015-05-22 NOTE — Progress Notes (Signed)
Urine drug screen for this encounter is consistent for prescribed medication 

## 2015-06-11 ENCOUNTER — Encounter: Payer: Self-pay | Admitting: Registered Nurse

## 2015-06-11 ENCOUNTER — Encounter
Payer: No Typology Code available for payment source | Attending: Physical Medicine and Rehabilitation | Admitting: Registered Nurse

## 2015-06-11 VITALS — BP 133/85 | HR 90 | Resp 16

## 2015-06-11 DIAGNOSIS — M5126 Other intervertebral disc displacement, lumbar region: Secondary | ICD-10-CM | POA: Insufficient documentation

## 2015-06-11 DIAGNOSIS — M4806 Spinal stenosis, lumbar region: Secondary | ICD-10-CM | POA: Insufficient documentation

## 2015-06-11 DIAGNOSIS — G834 Cauda equina syndrome: Secondary | ICD-10-CM

## 2015-06-11 DIAGNOSIS — Z79899 Other long term (current) drug therapy: Secondary | ICD-10-CM

## 2015-06-11 DIAGNOSIS — G894 Chronic pain syndrome: Secondary | ICD-10-CM

## 2015-06-11 DIAGNOSIS — Z5181 Encounter for therapeutic drug level monitoring: Secondary | ICD-10-CM

## 2015-06-11 DIAGNOSIS — M7062 Trochanteric bursitis, left hip: Secondary | ICD-10-CM

## 2015-06-11 DIAGNOSIS — M48061 Spinal stenosis, lumbar region without neurogenic claudication: Secondary | ICD-10-CM

## 2015-06-11 DIAGNOSIS — M961 Postlaminectomy syndrome, not elsewhere classified: Secondary | ICD-10-CM

## 2015-06-11 DIAGNOSIS — M62830 Muscle spasm of back: Secondary | ICD-10-CM

## 2015-06-11 MED ORDER — OXYCODONE-ACETAMINOPHEN 10-325 MG PO TABS: 1.0000 | ORAL_TABLET | Freq: Three times a day (TID) | ORAL | Status: DC | PRN

## 2015-06-11 MED ORDER — MORPHINE SULFATE ER 30 MG PO TBCR: 30.0000 mg | EXTENDED_RELEASE_TABLET | Freq: Two times a day (BID) | ORAL | Status: DC

## 2015-06-11 NOTE — Progress Notes (Signed)
Subjective:    Patient ID: [name], female    DOB: Jun 07, 1970, 45 y.o.   MRN: [id number]  HPI: [name] is a 45 year old female who returns for follow up for chronic pain and medication refill. She states her pain is located in her lower back mainly right side radiating into her right lower extremity posteriorly, left hip and lower extremities. She rates her pain 10. Her current exercise regime is walking short distances with cane.  Pain Inventory Average Pain 8 Pain Right Now 8 My pain is sharp, stabbing, tingling and aching  In the last 24 hours, has pain interfered with the following? General activity 7 Relation with others 8 Enjoyment of life 8 What TIME of day is your pain at its worst? all Sleep (in general) Poor  Pain is worse with: walking, bending, sitting and standing Pain improves with: medication Relief from Meds: 6  Mobility use a cane ability to climb steps?  no do you drive?  no  Function not employed: date last employed .  Neuro/Psych trouble walking spasms dizziness  Prior Studies Any changes since last visit?  no  Physicians involved in your care Any changes since last visit?  no   Family History  Problem Relation Age of Onset  . Anesthesia problems Neg Hx   . Hypertension Maternal Grandmother    Social History   Social History  . Marital Status: Divorced    Spouse Name: N/A  . Number of Children: N/A  . Years of Education: N/A   Social History Main Topics  . Smoking status: Never Smoker   . Smokeless tobacco: Never Used  . Alcohol Use: No  . Drug Use: No  . Sexual Activity: Not Currently    Birth Control/ Protection: None     Comment: IUD removed 2 wks ago.   Other Topics Concern  . None   Social History Narrative   Past Surgical History  Procedure Laterality Date  . Neck surgery    . Cervical spine surgery  10  . Myomectomy  13  . Wisdom tooth extraction    . Cesarean section  1996  . Abdominal  hysterectomy  06/29/11    Robotic Assisted Hysterectomy  . Lumbar laminectomy  08/31/2011    Procedure: MICRODISCECTOMY LUMBAR LAMINECTOMY;  Surgeon: Tobi Bastos, MD;  Location: [facility];  Service: Orthopedics;  Laterality: N/A;  lumbar five sacral one central microdisectomy  . Posterior fusion cervical spine  02/02/2013    C 5 C6     [name]  . Posterior cervical fusion/foraminotomy N/A 02/02/2013    Procedure: POSTERIOR C5-6 SPINAL FUSION;  Surgeon: Melina Schools, MD;  Location: [place];  Service: Orthopedics;  Laterality: N/A;   Past Medical History  Diagnosis Date  . DVT (deep venous thrombosis) ([place])     ? vs lupus  . Fibroids   . Headache(784.0)   . Difficult intravenous access     hands are usually best for blood draws and IV starts  . Neck pain   . Arthritis   . Anemia     hx  . Complication of anesthesia     slow to wake up,   very difficult to get iv had picc line last time  . Scoliosis    LMP 02/27/2011  Opioid Risk Score:   Fall Risk Score:  `1  Depression screen PHQ 2/9  Depression screen [facility] 2/9 06/11/2015 08/27/2014 06/27/2014 05/22/2014  Decreased Interest 0 0 3  0  Down, Depressed, Hopeless 2 0 1 0  PHQ - 2 Score 2 0 4 0  Altered sleeping 3 - 3 -  Tired, decreased energy 3 - 3 -  Change in appetite 3 - 3 -  Feeling bad or failure about yourself  2 - 1 -  Trouble concentrating 2 - 0 -  Moving slowly or fidgety/restless 2 - 0 -  Suicidal thoughts 0 - 0 -  PHQ-9 Score 17 - 14 -  Difficult doing work/chores Very difficult - - -     Review of Systems  All other systems reviewed and are negative.      Objective:   Physical Exam  Constitutional: She is oriented to person, place, and time. She appears well-developed and well-nourished.  HENT:  Head: Normocephalic and atraumatic.  Neck: Normal range of motion. Neck supple.  Cardiovascular: Normal rate and regular rhythm.   Pulmonary/Chest: Effort normal and breath sounds normal.  Musculoskeletal:  Normal  Muscle Bulk and Muscle Testing Reveals: Upper Extremities: Full ROM and Muscle Strength 5/5 Lumbar Paraspinal Tenderness: L-3- L-5 Left Greater Trochanteric Tenderness Lower Extremities: Full ROM and Muscle Strength 5/5 Arises from chair slowly using cane for support Antalgic Gait   Neurological: She is alert and oriented to person, place, and time.  Skin: Skin is warm and dry.  Psychiatric: She has a normal mood and affect.  Nursing note and vitals reviewed.         Assessment & Plan:  1. 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 Status post decompressive laminectomy:  Refilled: Oxycodone 10mg /325 mg 1 tablet every 8 hours, #100. MS Contin 30 mg one tablet every 12 hours as needed. #60. 2. Insomnia: On elavil 100 mg hs.  3. Neuropathic pain: Continue Gabapentin to 300mg  TID  4. Muscle Spasms. Continue: Tizanidine 5. Neurogenic bowel and bladder- Continue to monitor 6. Constipation/Opioid Therapy: Continue Linzess 7. Depression: Continue Celexa: Awaiting appointment with Psychiatry. 8. Left: Greater Trochanteric Bursitis: Continue with Heat/Ice Therapy. Refuses Cortisone at this time, states not effective.  20 minutes of face to face patient care time was spent during this visit. All questions were encouraged and answered.

## 2015-06-17 MED FILL — MORPHINE SULF ER 30 MG TAB: 30 | 30 days supply | Qty: 60 | Fill #0

## 2015-06-17 MED FILL — OXYCODONE-APAP 10-325 TAB: 10-325 | 25 days supply | Qty: 100 | Fill #0

## 2015-07-15 ENCOUNTER — Encounter: Payer: Self-pay | Attending: Physical Medicine and Rehabilitation | Admitting: Registered Nurse

## 2015-07-15 ENCOUNTER — Encounter: Payer: Self-pay | Admitting: Registered Nurse

## 2015-07-15 VITALS — BP 112/75 | HR 93 | Resp 14

## 2015-07-15 DIAGNOSIS — M4806 Spinal stenosis, lumbar region: Secondary | ICD-10-CM | POA: Insufficient documentation

## 2015-07-15 DIAGNOSIS — M5416 Radiculopathy, lumbar region: Secondary | ICD-10-CM

## 2015-07-15 DIAGNOSIS — F329 Major depressive disorder, single episode, unspecified: Secondary | ICD-10-CM

## 2015-07-15 DIAGNOSIS — G834 Cauda equina syndrome: Secondary | ICD-10-CM | POA: Insufficient documentation

## 2015-07-15 DIAGNOSIS — M48061 Spinal stenosis, lumbar region without neurogenic claudication: Secondary | ICD-10-CM

## 2015-07-15 DIAGNOSIS — M7062 Trochanteric bursitis, left hip: Secondary | ICD-10-CM

## 2015-07-15 DIAGNOSIS — Z5181 Encounter for therapeutic drug level monitoring: Secondary | ICD-10-CM

## 2015-07-15 DIAGNOSIS — M7061 Trochanteric bursitis, right hip: Secondary | ICD-10-CM

## 2015-07-15 DIAGNOSIS — M62830 Muscle spasm of back: Secondary | ICD-10-CM

## 2015-07-15 DIAGNOSIS — Z79899 Other long term (current) drug therapy: Secondary | ICD-10-CM

## 2015-07-15 DIAGNOSIS — G894 Chronic pain syndrome: Secondary | ICD-10-CM

## 2015-07-15 DIAGNOSIS — F32A Depression, unspecified: Secondary | ICD-10-CM

## 2015-07-15 DIAGNOSIS — M961 Postlaminectomy syndrome, not elsewhere classified: Secondary | ICD-10-CM

## 2015-07-15 DIAGNOSIS — M5126 Other intervertebral disc displacement, lumbar region: Secondary | ICD-10-CM | POA: Insufficient documentation

## 2015-07-15 MED ORDER — OXYCODONE-ACETAMINOPHEN 10-325 MG PO TABS: 1.0000 | ORAL_TABLET | Freq: Three times a day (TID) | ORAL | Status: DC | PRN

## 2015-07-15 MED ORDER — MORPHINE SULFATE ER 30 MG PO TBCR: 30.0000 mg | EXTENDED_RELEASE_TABLET | Freq: Two times a day (BID) | ORAL | Status: DC

## 2015-07-15 MED ORDER — GABAPENTIN 300 MG PO CAPS: 300.0000 mg | ORAL_CAPSULE | Freq: Three times a day (TID) | ORAL | Status: DC

## 2015-07-15 MED ORDER — CITALOPRAM HYDROBROMIDE 10 MG PO TABS: 10.0000 mg | ORAL_TABLET | Freq: Every day | ORAL | Status: DC

## 2015-07-15 MED ORDER — TIZANIDINE HCL 4 MG PO TABS: ORAL_TABLET | ORAL | Status: DC

## 2015-07-15 MED ORDER — AMITRIPTYLINE HCL 100 MG PO TABS: 100.0000 mg | ORAL_TABLET | Freq: Every day | ORAL | Status: DC

## 2015-07-15 NOTE — Progress Notes (Signed)
Subjective:    Patient ID: [name], female    DOB: [birth year], 45 y.o.   MRN: [id number]  HPI: [name] is a 45 year old female who returns for follow up for chronic pain and medication refill. She states her pain is located in her lower back radiating into her bilateral hips and  lower extremities laterally. She rates her pain 9. Her current exercise regime is attending the aquatic center 2-3 times a week and walking short distances with cane.  Pain Inventory Average Pain 9 Pain Right Now 9 My pain is sharp, dull, stabbing, tingling and aching  In the last 24 hours, has pain interfered with the following? General activity 7 Relation with others 8 Enjoyment of life 8 What TIME of day is your pain at its worst? all Sleep (in general) Poor  Pain is worse with: walking, bending, sitting and standing Pain improves with: medication Relief from Meds: 6  Mobility walk with assistance use a cane ability to climb steps?  no do you drive?  no  Function not employed: date last employed . I need assistance with the following:  dressing, household duties and shopping  Neuro/Psych numbness trouble walking spasms anxiety  Prior Studies Any changes since last visit?  no  Physicians involved in your care Any changes since last visit?  no   Family History  Problem Relation Age of Onset  . Anesthesia problems Neg Hx   . Hypertension Maternal Grandmother    Social History   Social History  . Marital Status: Divorced    Spouse Name: N/A  . Number of Children: N/A  . Years of Education: N/A   Social History Main Topics  . Smoking status: Never Smoker   . Smokeless tobacco: Never Used  . Alcohol Use: No  . Drug Use: No  . Sexual Activity: Not Currently    Birth Control/ Protection: None     Comment: IUD removed 2 wks ago.   Other Topics Concern  . None   Social History Narrative   Past Surgical History  Procedure Laterality Date  . Neck  surgery    . Cervical spine surgery  10  . Myomectomy  13  . Wisdom tooth extraction    . Cesarean section  1996  . Abdominal hysterectomy  06/29/11    Robotic Assisted Hysterectomy  . Lumbar laminectomy  08/31/2011    Procedure: MICRODISCECTOMY LUMBAR LAMINECTOMY;  Surgeon: Tobi Bastos, MD;  Location: [facility];  Service: Orthopedics;  Laterality: N/A;  lumbar five sacral one central microdisectomy  . Posterior fusion cervical spine  02/02/2013    C 5 C6     [name]  . Posterior cervical fusion/foraminotomy N/A 02/02/2013    Procedure: POSTERIOR C5-6 SPINAL FUSION;  Surgeon: Melina Schools, MD;  Location: [place];  Service: Orthopedics;  Laterality: N/A;   Past Medical History  Diagnosis Date  . DVT (deep venous thrombosis) ([place])     ? vs lupus  . Fibroids   . Headache(784.0)   . Difficult intravenous access     hands are usually best for blood draws and IV starts  . Neck pain   . Arthritis   . Anemia     hx  . Complication of anesthesia     slow to wake up,   very difficult to get iv had picc line last time  . Scoliosis    BP 112/75 mmHg  Pulse 93  Resp 14  SpO2 100%  LMP 02/27/2011  Opioid Risk Score:   Fall Risk Score:  `1  Depression screen PHQ 2/9  Depression screen [facility] 2/9 06/11/2015 08/27/2014 06/27/2014 05/22/2014  Decreased Interest 0 0 3 0  Down, Depressed, Hopeless 2 0 1 0  PHQ - 2 Score 2 0 4 0  Altered sleeping 3 - 3 -  Tired, decreased energy 3 - 3 -  Change in appetite 3 - 3 -  Feeling bad or failure about yourself  2 - 1 -  Trouble concentrating 2 - 0 -  Moving slowly or fidgety/restless 2 - 0 -  Suicidal thoughts 0 - 0 -  PHQ-9 Score 17 - 14 -  Difficult doing work/chores Very difficult - - -     Review of Systems  Constitutional: Positive for diaphoresis and unexpected weight change.  Cardiovascular: Positive for leg swelling.  Gastrointestinal: Positive for nausea, abdominal pain and constipation.  All other systems reviewed and are  negative.      Objective:   Physical Exam  Constitutional: She is oriented to person, place, and time. She appears well-developed and well-nourished.  HENT:  Head: Normocephalic.  Neck: Normal range of motion. Neck supple.  Cardiovascular: Normal rate and regular rhythm.   Pulmonary/Chest: Effort normal and breath sounds normal.  Musculoskeletal:  Normal Muscle Bulk and Muscle Testing Reveals: Upper Extremities: Full ROM and Muscle Strength 5/5 Thoracic Paraspinal Tenderness: T-1- T-3 Lumbar Paraspinal Tenderness: L-3- L-5 Bilateral Greater Trochanteric Tenderness Lower Extremities: Full ROM and Muscle Strength 5/5 Lower Extremities: Full ROM and Muscle Strength 5/5 Arises from chair slowly using straight cane for support Antalgic gait   Neurological: She is alert and oriented to person, place, and time.  Skin: Skin is warm and dry.  Psychiatric: She has a normal mood and affect.  Nursing note and vitals reviewed.         Assessment & Plan:  1. 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 Status post decompressive laminectomy:  Refilled: Oxycodone 10mg /325 mg 1 tablet every 8 hours, may take an extra tablet when pain is severe #100. MS Contin 30 mg one tablet every 12 hours as needed. #60. We will continue the opioid monitoring program,this consists of regular clinic visits, examinations, urine drug screen,pill counts as well as use of [place] Controlled Substance Reporting System. 2. Insomnia: On elavil 100 mg hs.  3. Neuropathic pain: Continue Gabapentin to 300mg  TID  4. Muscle Spasms. Continue: Tizanidine 5. Neurogenic bowel and bladder- Continue to monitor 6. Constipation/Opioid Therapy: Continue Linzess 7. Depression: Continue Celexa 8. Bilateral Greater Trochanteric Bursitis: Continue with Heat/Ice Therapy.   20 minutes of face to face patient care time was spent during this visit. All questions were encouraged and answered.

## 2015-07-18 MED FILL — MORPHINE SULF ER 30 MG TAB: 30 | 30 days supply | Qty: 60 | Fill #0

## 2015-07-18 MED FILL — OXYCODONE-APAP 10-325 TAB: 10-325 | 30 days supply | Qty: 100 | Fill #0

## 2015-08-08 MED FILL — ?AMITRIPTYLINE HCL 50MG TAB: 50 | 30 days supply | Qty: 60 | Fill #0

## 2015-08-08 MED FILL — ?CITALOPRAM HBR 10 MG TABLE: 10 | 30 days supply | Qty: 30 | Fill #0

## 2015-08-08 MED FILL — GABAPENTIN 300 MG CAPSULE: 300 | 30 days supply | Qty: 90 | Fill #0

## 2015-08-08 MED FILL — tiZANidine HCL 4 MG TABS: 4 | 30 days supply | Qty: 180 | Fill #0

## 2015-08-12 ENCOUNTER — Other Ambulatory Visit: Payer: Self-pay | Admitting: Family Medicine

## 2015-08-14 ENCOUNTER — Encounter: Payer: Self-pay | Admitting: Registered Nurse

## 2015-08-14 ENCOUNTER — Encounter: Payer: Self-pay | Attending: Physical Medicine and Rehabilitation | Admitting: Registered Nurse

## 2015-08-14 VITALS — BP 127/84 | HR 93 | Resp 16

## 2015-08-14 DIAGNOSIS — M961 Postlaminectomy syndrome, not elsewhere classified: Secondary | ICD-10-CM

## 2015-08-14 DIAGNOSIS — M7061 Trochanteric bursitis, right hip: Secondary | ICD-10-CM

## 2015-08-14 DIAGNOSIS — M4806 Spinal stenosis, lumbar region: Secondary | ICD-10-CM | POA: Insufficient documentation

## 2015-08-14 DIAGNOSIS — Z5181 Encounter for therapeutic drug level monitoring: Secondary | ICD-10-CM

## 2015-08-14 DIAGNOSIS — F32A Depression, unspecified: Secondary | ICD-10-CM

## 2015-08-14 DIAGNOSIS — K5903 Drug induced constipation: Secondary | ICD-10-CM

## 2015-08-14 DIAGNOSIS — F329 Major depressive disorder, single episode, unspecified: Secondary | ICD-10-CM

## 2015-08-14 DIAGNOSIS — T402X5A Adverse effect of other opioids, initial encounter: Secondary | ICD-10-CM

## 2015-08-14 DIAGNOSIS — M5416 Radiculopathy, lumbar region: Secondary | ICD-10-CM

## 2015-08-14 DIAGNOSIS — G894 Chronic pain syndrome: Secondary | ICD-10-CM

## 2015-08-14 DIAGNOSIS — M5126 Other intervertebral disc displacement, lumbar region: Secondary | ICD-10-CM | POA: Insufficient documentation

## 2015-08-14 DIAGNOSIS — M62830 Muscle spasm of back: Secondary | ICD-10-CM

## 2015-08-14 DIAGNOSIS — G834 Cauda equina syndrome: Secondary | ICD-10-CM | POA: Insufficient documentation

## 2015-08-14 DIAGNOSIS — Z79899 Other long term (current) drug therapy: Secondary | ICD-10-CM

## 2015-08-14 DIAGNOSIS — M7062 Trochanteric bursitis, left hip: Secondary | ICD-10-CM

## 2015-08-14 MED ORDER — OXYCODONE-ACETAMINOPHEN 10-325 MG PO TABS: 1.0000 | ORAL_TABLET | Freq: Three times a day (TID) | ORAL | Status: DC | PRN

## 2015-08-14 MED ORDER — MORPHINE SULFATE ER 30 MG PO TBCR: 30.0000 mg | EXTENDED_RELEASE_TABLET | Freq: Two times a day (BID) | ORAL | Status: DC

## 2015-08-14 NOTE — Progress Notes (Signed)
Subjective:    Patient ID: [name], female    DOB: 08-Jul-1970, 45 y.o.   MRN: [id number]  HPI: [name] is a 45 year old female who returns for follow up for chronic pain and medication refill. She states her pain is located in her lower back radiating into her bilateral hips and lower extremities anteriorly. She rates her pain 10. Her usual exercise regime is attending the aquatic center 2-3 times a week and walking short distances with cane, she hasn't been able to follow her usual routine due to Lupus Flare up.   Pain Inventory Average Pain 10 Pain Right Now 10 My pain is sharp, burning, dull, stabbing, tingling and aching  In the last 24 hours, has pain interfered with the following? General activity 8 Relation with others 10 Enjoyment of life 10 What TIME of day is your pain at its worst? All Sleep (in general) Poor  Pain is worse with: walking, bending, sitting and standing Pain improves with: heat/ice, therapy/exercise and medication Relief from Meds: 3  Mobility use a cane use a walker ability to climb steps?  no do you drive?  no  Function not employed: date last employed NA I need assistance with the following:  dressing, meal prep, household duties and shopping  Neuro/Psych numbness tingling trouble walking depression  Prior Studies Any changes since last visit?  no  Physicians involved in your care Any changes since last visit?  no   Family History  Problem Relation Age of Onset  . Anesthesia problems Neg Hx   . Hypertension Maternal Grandmother    Social History   Social History  . Marital Status: Divorced    Spouse Name: N/A  . Number of Children: N/A  . Years of Education: N/A   Social History Main Topics  . Smoking status: Never Smoker   . Smokeless tobacco: Never Used  . Alcohol Use: No  . Drug Use: No  . Sexual Activity: Not Currently    Birth Control/ Protection: None     Comment: IUD removed 2 wks ago.    Other Topics Concern  . None   Social History Narrative   Past Surgical History  Procedure Laterality Date  . Neck surgery    . Cervical spine surgery  10  . Myomectomy  13  . Wisdom tooth extraction    . Cesarean section  1996  . Abdominal hysterectomy  06/29/11    Robotic Assisted Hysterectomy  . Lumbar laminectomy  08/31/2011    Procedure: MICRODISCECTOMY LUMBAR LAMINECTOMY;  Surgeon: Tobi Bastos, MD;  Location: [facility];  Service: Orthopedics;  Laterality: N/A;  lumbar five sacral one central microdisectomy  . Posterior fusion cervical spine  02/02/2013    C 5 C6     [name]  . Posterior cervical fusion/foraminotomy N/A 02/02/2013    Procedure: POSTERIOR C5-6 SPINAL FUSION;  Surgeon: Melina Schools, MD;  Location: [place];  Service: Orthopedics;  Laterality: N/A;   Past Medical History  Diagnosis Date  . DVT (deep venous thrombosis) ([place])     ? vs lupus  . Fibroids   . Headache(784.0)   . Difficult intravenous access     hands are usually best for blood draws and IV starts  . Neck pain   . Arthritis   . Anemia     hx  . Complication of anesthesia     slow to wake up,   very difficult to get iv had picc line  last time  . Scoliosis    BP 127/84 mmHg  Pulse 93  Resp 16  SpO2 96%  LMP 02/27/2011  Opioid Risk Score:   Fall Risk Score:  `1  Depression screen PHQ 2/9  Depression screen [facility] 2/9 06/11/2015 08/27/2014 06/27/2014 05/22/2014  Decreased Interest 0 0 3 0  Down, Depressed, Hopeless 2 0 1 0  PHQ - 2 Score 2 0 4 0  Altered sleeping 3 - 3 -  Tired, decreased energy 3 - 3 -  Change in appetite 3 - 3 -  Feeling bad or failure about yourself  2 - 1 -  Trouble concentrating 2 - 0 -  Moving slowly or fidgety/restless 2 - 0 -  Suicidal thoughts 0 - 0 -  PHQ-9 Score 17 - 14 -  Difficult doing work/chores Very difficult - - -        Review of Systems  Constitutional: Positive for unexpected weight change.       Night sweats  Cardiovascular:       Limb  swelling   Gastrointestinal: Positive for nausea, vomiting, abdominal pain and constipation.  All other systems reviewed and are negative.      Objective:   Physical Exam  Constitutional: She is oriented to person, place, and time. She appears well-developed and well-nourished.  HENT:  Head: Normocephalic and atraumatic.  Neck: Normal range of motion. Neck supple.  Cardiovascular: Normal rate and regular rhythm.   Pulmonary/Chest: Effort normal and breath sounds normal.  Musculoskeletal:  Normal Muscle Bulk and Muscle Testing Reveals: Upper Extremities: Full ROM and Muscle Strength 5/5 Lumbar Hypersensitivity Bilateral Greater Trochanteric Tenderness Lower Extremities: Decreased ROM and Muscle strength 5/5 Bilateral Lower Extremities Flexion produces Pain into Lower Extremities Arises from chair slowly using straight cane for support Antalgic Gait  Neurological: She is alert and oriented to person, place, and time.  Skin: Skin is warm and dry.  Psychiatric: She has a normal mood and affect.  Nursing note and vitals reviewed.         Assessment & Plan:  1. 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 Status post decompressive laminectomy:  Refilled: Oxycodone 10mg /325 mg 1 tablet every 8 hours, may take an extra tablet when pain is severe #100. MS Contin 30 mg one tablet every 12 hours as needed. #60. We will continue the opioid monitoring program,this consists of regular clinic visits, examinations, urine drug screen,pill counts as well as use of [place] Controlled Substance Reporting System. 2. Insomnia: On elavil 100 mg hs.  3. Neuropathic pain: Continue Gabapentin to 300mg  TID  4. Muscle Spasms. Continue: Tizanidine 5. Neurogenic bowel and bladder- Continue to monitor 6. Constipation/Opioid Therapy: Continue Linzess 7. Depression: Continue Celexa 8. Bilateral Greater Trochanteric Bursitis: Continue with Heat/Ice Therapy.   20 minutes of face to face  patient care time was spent during this visit. All questions were encouraged and answered.

## 2015-08-15 MED FILL — MORPHINE SULF ER 30 MG TAB: 30 | 30 days supply | Qty: 60 | Fill #0

## 2015-08-15 MED FILL — OXYCODONE-APAP 10-325 TAB: 10-325 | 30 days supply | Qty: 100 | Fill #0

## 2015-08-29 ENCOUNTER — Ambulatory Visit: Payer: Medicaid Other | Admitting: Internal Medicine

## 2015-08-30 ENCOUNTER — Ambulatory Visit: Payer: Medicaid Other | Admitting: Family Medicine

## 2015-09-10 ENCOUNTER — Encounter: Payer: Self-pay | Attending: Physical Medicine and Rehabilitation | Admitting: Registered Nurse

## 2015-09-10 ENCOUNTER — Encounter: Payer: Self-pay | Admitting: Registered Nurse

## 2015-09-10 VITALS — BP 128/90 | HR 89 | Resp 14

## 2015-09-10 DIAGNOSIS — Z5181 Encounter for therapeutic drug level monitoring: Secondary | ICD-10-CM

## 2015-09-10 DIAGNOSIS — M62830 Muscle spasm of back: Secondary | ICD-10-CM

## 2015-09-10 DIAGNOSIS — M48061 Spinal stenosis, lumbar region without neurogenic claudication: Secondary | ICD-10-CM

## 2015-09-10 DIAGNOSIS — M4806 Spinal stenosis, lumbar region: Secondary | ICD-10-CM | POA: Insufficient documentation

## 2015-09-10 DIAGNOSIS — M961 Postlaminectomy syndrome, not elsewhere classified: Secondary | ICD-10-CM

## 2015-09-10 DIAGNOSIS — F329 Major depressive disorder, single episode, unspecified: Secondary | ICD-10-CM

## 2015-09-10 DIAGNOSIS — M5416 Radiculopathy, lumbar region: Secondary | ICD-10-CM

## 2015-09-10 DIAGNOSIS — M7062 Trochanteric bursitis, left hip: Secondary | ICD-10-CM

## 2015-09-10 DIAGNOSIS — G834 Cauda equina syndrome: Secondary | ICD-10-CM | POA: Insufficient documentation

## 2015-09-10 DIAGNOSIS — Z79899 Other long term (current) drug therapy: Secondary | ICD-10-CM

## 2015-09-10 DIAGNOSIS — M5126 Other intervertebral disc displacement, lumbar region: Secondary | ICD-10-CM | POA: Insufficient documentation

## 2015-09-10 DIAGNOSIS — F32A Depression, unspecified: Secondary | ICD-10-CM

## 2015-09-10 DIAGNOSIS — T402X5A Adverse effect of other opioids, initial encounter: Secondary | ICD-10-CM

## 2015-09-10 DIAGNOSIS — G894 Chronic pain syndrome: Secondary | ICD-10-CM

## 2015-09-10 DIAGNOSIS — M7061 Trochanteric bursitis, right hip: Secondary | ICD-10-CM

## 2015-09-10 DIAGNOSIS — K5903 Drug induced constipation: Secondary | ICD-10-CM

## 2015-09-10 MED ORDER — GABAPENTIN 300 MG PO CAPS: 300.0000 mg | ORAL_CAPSULE | Freq: Three times a day (TID) | ORAL | Status: DC

## 2015-09-10 MED ORDER — AMITRIPTYLINE HCL 100 MG PO TABS: 100.0000 mg | ORAL_TABLET | Freq: Every day | ORAL | Status: DC

## 2015-09-10 MED ORDER — TIZANIDINE HCL 4 MG PO TABS: ORAL_TABLET | ORAL | Status: DC

## 2015-09-10 MED ORDER — MORPHINE SULFATE ER 30 MG PO TBCR: 30.0000 mg | EXTENDED_RELEASE_TABLET | Freq: Two times a day (BID) | ORAL | Status: DC

## 2015-09-10 MED ORDER — OXYCODONE-ACETAMINOPHEN 10-325 MG PO TABS: 1.0000 | ORAL_TABLET | Freq: Three times a day (TID) | ORAL | Status: DC | PRN

## 2015-09-10 NOTE — Progress Notes (Signed)
Subjective:    Patient ID: [name], female    DOB: November 03, 1970, 45 y.o.   MRN: [id number]  HPI: [name] is a 45 year old female who returns for follow up for chronic pain and medication refill. [name] states her pain is located in her lower back radiating into her bilateral hips and lower extremities anteriorly. [name] rates her pain 9. Her current exercise regime is walking short distances with cane.  Pain Inventory Average Pain 9 Pain Right Now 9 My pain is constant, sharp, burning, dull, stabbing, tingling and aching  In the last 24 hours, has pain interfered with the following? General activity 9 Relation with others 10 Enjoyment of life 9 What TIME of day is your pain at its worst? all Sleep (in general) Poor  Pain is worse with: walking, bending, sitting and standing Pain improves with: medication Relief from Meds: 0  Mobility walk with assistance use a cane ability to climb steps?  no do you drive?  no  Function not employed: date last employed . I need assistance with the following:  household duties and shopping  Neuro/Psych numbness tingling trouble walking spasms dizziness  Prior Studies Any changes since last visit?  no  Physicians involved in your care Any changes since last visit?  no   Family History  Problem Relation Age of Onset  . Anesthesia problems Neg Hx   . Hypertension Maternal Grandmother    Social History   Social History  . Marital Status: Divorced    Spouse Name: N/A  . Number of Children: N/A  . Years of Education: N/A   Social History Main Topics  . Smoking status: Never Smoker   . Smokeless tobacco: Never Used  . Alcohol Use: No  . Drug Use: No  . Sexual Activity: Not Currently    Birth Control/ Protection: None     Comment: IUD removed 2 wks ago.   Other Topics Concern  . None   Social History Narrative   Past Surgical History  Procedure Laterality Date  . Neck surgery    . Cervical spine  surgery  10  . Myomectomy  13  . Wisdom tooth extraction    . Cesarean section  1996  . Abdominal hysterectomy  06/29/11    Robotic Assisted Hysterectomy  . Lumbar laminectomy  08/31/2011    Procedure: MICRODISCECTOMY LUMBAR LAMINECTOMY;  Surgeon: Tobi Bastos, MD;  Location: [facility];  Service: Orthopedics;  Laterality: N/A;  lumbar five sacral one central microdisectomy  . Posterior fusion cervical spine  02/02/2013    C 5 C6     [name]  . Posterior cervical fusion/foraminotomy N/A 02/02/2013    Procedure: POSTERIOR C5-6 SPINAL FUSION;  Surgeon: Melina Schools, MD;  Location: [place];  Service: Orthopedics;  Laterality: N/A;   Past Medical History  Diagnosis Date  . DVT (deep venous thrombosis) ([place])     ? vs lupus  . Fibroids   . Headache(784.0)   . Difficult intravenous access     hands are usually best for blood draws and IV starts  . Neck pain   . Arthritis   . Anemia     hx  . Complication of anesthesia     slow to wake up,   very difficult to get iv had picc line last time  . Scoliosis    BP 128/90 mmHg  Pulse 89  Resp 14  SpO2 99%  LMP 02/27/2011  Opioid Risk Score:  Fall Risk Score:  `1  Depression screen PHQ 2/9  Depression screen [facility] 2/9 06/11/2015 08/27/2014 06/27/2014 05/22/2014  Decreased Interest 0 0 3 0  Down, Depressed, Hopeless 2 0 1 0  PHQ - 2 Score 2 0 4 0  Altered sleeping 3 - 3 -  Tired, decreased energy 3 - 3 -  Change in appetite 3 - 3 -  Feeling bad or failure about yourself  2 - 1 -  Trouble concentrating 2 - 0 -  Moving slowly or fidgety/restless 2 - 0 -  Suicidal thoughts 0 - 0 -  PHQ-9 Score 17 - 14 -  Difficult doing work/chores Very difficult - - -     Review of Systems  Constitutional: Negative for unexpected weight change.  HENT: Negative for congestion.   Eyes: Negative for discharge.  Respiratory: Negative for apnea.   Cardiovascular: Negative for chest pain.  Gastrointestinal: Negative for abdominal distention.  Endocrine:  Negative for polyuria.  Genitourinary: Negative for difficulty urinating.  Musculoskeletal: Positive for back pain and gait problem.       Spasms   Skin: Negative for rash.  Allergic/Immunologic: Negative for immunocompromised state.  Neurological: Positive for dizziness and numbness.       Tingling   Hematological: Does not bruise/bleed easily.  Psychiatric/Behavioral: Negative for agitation.  All other systems reviewed and are negative.      Objective:   Physical Exam  Constitutional: [name] is oriented to person, place, and time. [name] appears well-developed and well-nourished.  HENT:  Head: Normocephalic and atraumatic.  Neck: Normal range of motion. Neck supple.  Cardiovascular: Normal rate and regular rhythm.   Pulmonary/Chest: Effort normal and breath sounds normal.  Musculoskeletal:  Normal Muscle Bulk and Muscle Testing Reveals: Upper Extremities: Full ROM and Muscle Strength 5/5 Lumbar Hypersensitivity Bilateral Greater Trochanteric Tenderness Lower Extremities: Full ROM and Muscle Strength 5/5 Arises from chair slowly using straight cane for support Antalgic gait  Neurological: [name] is alert and oriented to person, place, and time.  Skin: Skin is warm and dry.  Psychiatric: [name] has a normal mood and affect.  Nursing note and vitals reviewed.         Assessment & Plan:  1. 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 Status post decompressive laminectomy:  Refilled: Oxycodone 10mg /325 mg 1 tablet every 8 hours, may take an extra tablet when pain is severe #100. MS Contin 30 mg one tablet every 12 hours as needed. #60. We will continue the opioid monitoring program,this consists of regular clinic visits, examinations, urine drug screen,pill counts as well as use of [place] Controlled Substance Reporting System. 2. Insomnia: On elavil 100 mg hs.  3. Neuropathic pain: Continue Gabapentin to 300mg  TID  4. Muscle Spasms. Continue: Tizanidine 5.  Neurogenic bowel and bladder- Continue to monitor 6. Constipation/Opioid Therapy: Continue Linzess 7. Depression: Continue Celexa 8. Bilateral Greater Trochanteric Bursitis: Continue with Heat/Ice Therapy.   20 minutes of face to face patient care time was spent during this visit. All questions were encouraged and answered.

## 2015-09-17 LAB — 6-ACETYLMORPHINE,TOXASSURE ADD
6-ACETYLMORPHINE: NEGATIVE
6-acetylmorphine: NOT DETECTED ng/mg creat

## 2015-09-17 LAB — TOXASSURE SELECT,+ANTIDEPR,UR: PDF: 0

## 2015-09-18 ENCOUNTER — Telehealth: Payer: Self-pay | Admitting: Registered Nurse

## 2015-09-18 ENCOUNTER — Telehealth: Payer: Self-pay | Admitting: Physical Medicine & Rehabilitation

## 2015-09-18 NOTE — Telephone Encounter (Signed)
THC in urine. Pt will be given one more chance. If she tests positive again.. All narcotics will be taper off.

## 2015-09-18 NOTE — Telephone Encounter (Signed)
Placed a call to Ms. Lieder regarding her UDS, no answer. Left message to return the call

## 2015-09-19 NOTE — Telephone Encounter (Signed)
Placed a call to [name], her UDS was discussed. [name] states she doesn't smoke THC, admits going to a cookout and having a brownie. Reviewed the Narcotic Policy and Dr. Naaman Plummer note. She verbalizes understanding.

## 2015-09-20 NOTE — Telephone Encounter (Signed)
Please see note for 09/19/15 with Danella Sensing for explanation.

## 2015-10-09 ENCOUNTER — Encounter: Payer: Self-pay | Admitting: Physical Medicine & Rehabilitation

## 2015-10-09 ENCOUNTER — Encounter: Payer: Self-pay | Attending: Physical Medicine & Rehabilitation | Admitting: Physical Medicine & Rehabilitation

## 2015-10-09 VITALS — BP 120/87 | HR 84

## 2015-10-09 DIAGNOSIS — Z9889 Other specified postprocedural states: Secondary | ICD-10-CM | POA: Insufficient documentation

## 2015-10-09 DIAGNOSIS — F419 Anxiety disorder, unspecified: Secondary | ICD-10-CM | POA: Insufficient documentation

## 2015-10-09 DIAGNOSIS — G4700 Insomnia, unspecified: Secondary | ICD-10-CM | POA: Insufficient documentation

## 2015-10-09 DIAGNOSIS — M5126 Other intervertebral disc displacement, lumbar region: Secondary | ICD-10-CM

## 2015-10-09 DIAGNOSIS — G834 Cauda equina syndrome: Secondary | ICD-10-CM | POA: Insufficient documentation

## 2015-10-09 DIAGNOSIS — M62838 Other muscle spasm: Secondary | ICD-10-CM | POA: Insufficient documentation

## 2015-10-09 DIAGNOSIS — F329 Major depressive disorder, single episode, unspecified: Secondary | ICD-10-CM | POA: Insufficient documentation

## 2015-10-09 DIAGNOSIS — R200 Anesthesia of skin: Secondary | ICD-10-CM | POA: Insufficient documentation

## 2015-10-09 DIAGNOSIS — F1290 Cannabis use, unspecified, uncomplicated: Secondary | ICD-10-CM | POA: Insufficient documentation

## 2015-10-09 DIAGNOSIS — R202 Paresthesia of skin: Secondary | ICD-10-CM | POA: Insufficient documentation

## 2015-10-09 DIAGNOSIS — M4806 Spinal stenosis, lumbar region: Secondary | ICD-10-CM

## 2015-10-09 DIAGNOSIS — M48061 Spinal stenosis, lumbar region without neurogenic claudication: Secondary | ICD-10-CM

## 2015-10-09 DIAGNOSIS — M7062 Trochanteric bursitis, left hip: Secondary | ICD-10-CM

## 2015-10-09 DIAGNOSIS — M7061 Trochanteric bursitis, right hip: Secondary | ICD-10-CM

## 2015-10-09 DIAGNOSIS — M5127 Other intervertebral disc displacement, lumbosacral region: Secondary | ICD-10-CM | POA: Insufficient documentation

## 2015-10-09 MED ORDER — OXYCODONE-ACETAMINOPHEN 10-325 MG PO TABS: 1.0000 | ORAL_TABLET | Freq: Three times a day (TID) | ORAL | Status: DC | PRN

## 2015-10-09 NOTE — Progress Notes (Signed)
Subjective:    Patient ID: [name], female    DOB: 25-Dec-1970, 45 y.o.   MRN: [id number]  HPI   [name] is here in follow up of her cauda equina syndrome. She tested positive for [facility] when a family member baked cakes with THC.  She was quite upset and apparently many other people were served the same food, even children.   She is walking with a cane. She sometimes struggles with posture. Her right shoulder has been bothering her over the last 2 weeks. She doesn't remember a triggering event. Her hips are tender but manageable.   She wasn't able to fill MS contin due to cost. The percocet is helping control her pain. She is taking her other medications as prescribed.   Pain Inventory Average Pain 8 Pain Right Now 8 My pain is sharp, stabbing, tingling and aching  In the last 24 hours, has pain interfered with the following? General activity 9 Relation with others 9 Enjoyment of life 10 What TIME of day is your pain at its worst? all times Sleep (in general) Poor  Pain is worse with: walking, bending, sitting and standing Pain improves with: medication Relief from Meds: 5  Mobility use a cane ability to climb steps?  no do you drive?  no  Function not employed: date last employed . I need assistance with the following:  dressing, bathing, household duties and shopping  Neuro/Psych numbness tingling trouble walking spasms anxiety  Prior Studies Any changes since last visit?  no  Physicians involved in your care Any changes since last visit?  no   Family History  Problem Relation Age of Onset  . Anesthesia problems Neg Hx   . Hypertension Maternal Grandmother    Social History   Social History  . Marital Status: Divorced    Spouse Name: N/A  . Number of Children: N/A  . Years of Education: N/A   Social History Main Topics  . Smoking status: Never Smoker   . Smokeless tobacco: Never Used  . Alcohol Use: No  . Drug Use: No  . Sexual Activity: Not  Currently    Birth Control/ Protection: None     Comment: IUD removed 2 wks ago.   Other Topics Concern  . None   Social History Narrative   Past Surgical History  Procedure Laterality Date  . Neck surgery    . Cervical spine surgery  10  . Myomectomy  13  . Wisdom tooth extraction    . Cesarean section  1996  . Abdominal hysterectomy  06/29/11    Robotic Assisted Hysterectomy  . Lumbar laminectomy  08/31/2011    Procedure: MICRODISCECTOMY LUMBAR LAMINECTOMY;  Surgeon: Tobi Bastos, MD;  Location: [facility];  Service: Orthopedics;  Laterality: N/A;  lumbar five sacral one central microdisectomy  . Posterior fusion cervical spine  02/02/2013    C 5 C6     [name]  . Posterior cervical fusion/foraminotomy N/A 02/02/2013    Procedure: POSTERIOR C5-6 SPINAL FUSION;  Surgeon: Melina Schools, MD;  Location: [place];  Service: Orthopedics;  Laterality: N/A;   Past Medical History  Diagnosis Date  . DVT (deep venous thrombosis) ([place])     ? vs lupus  . Fibroids   . Headache(784.0)   . Difficult intravenous access     hands are usually best for blood draws and IV starts  . Neck pain   . Arthritis   . Anemia     hx  .  Complication of anesthesia     slow to wake up,   very difficult to get iv had picc line last time  . Scoliosis    BP 120/87 mmHg  Pulse 84  SpO2 99%  LMP 02/27/2011  Opioid Risk Score:   Fall Risk Score:  `1  Depression screen PHQ 2/9  Depression screen [facility] 2/9 06/11/2015 08/27/2014 06/27/2014 05/22/2014  Decreased Interest 0 0 3 0  Down, Depressed, Hopeless 2 0 1 0  PHQ - 2 Score 2 0 4 0  Altered sleeping 3 - 3 -  Tired, decreased energy 3 - 3 -  Change in appetite 3 - 3 -  Feeling bad or failure about yourself  2 - 1 -  Trouble concentrating 2 - 0 -  Moving slowly or fidgety/restless 2 - 0 -  Suicidal thoughts 0 - 0 -  PHQ-9 Score 17 - 14 -  Difficult doing work/chores Very difficult - - -     Review of Systems  Constitutional: Negative.   HENT:  Negative.   Eyes: Negative.   Respiratory: Negative.   Cardiovascular: Negative.   Gastrointestinal: Negative.   Endocrine: Negative.   Genitourinary: Negative.   Musculoskeletal: Positive for gait problem.  Skin: Negative.   Allergic/Immunologic: Negative.   Neurological: Positive for numbness.  Hematological: Negative.   Psychiatric/Behavioral: Negative.        Objective:   Physical Exam  Constitutional: She is oriented to person, place, and time. She appears well-developed and well-nourished.  HENT:  Head: Normocephalic and atraumatic.  Neck: Normal range of motion. Neck supple.  Cardiovascular: Normal rate and regular rhythm.  Pulmonary/Chest: Effort normal and breath sounds normal.  Musculoskeletal:  Normal Muscle Bulk and Muscle Testing Reveals: Upper Extremities: Full ROM and Muscle Strength 5/5 Lumbar Hypersensitivity Bilateral Greater Trochanteric Tenderness Lower Extremities: Full ROM and Muscle Strength 5/5 Arises from chair slowly using straight cane for support Antalgic gait  Neurological: She is alert and oriented to person, place, and time.  Skin: Skin is warm and dry.  Psychiatric: She has a normal mood and affect.  Nursing note and vitals reviewed.         Assessment & Plan:  1. 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 Status post decompressive laminectomy:  Refilled: Oxycodone 10mg /325 mg 1 tablet every 8 hours, may take an extra tablet when pain is severe #100.  -will hold MS Contin 30 mg one tablet every 12 hours due to cost. -I am giving her a second chance regarding her THC testing given the circumstances above -discussed importance of posture and HEP 2. Insomnia: On elavil 100 mg hs.  3. Neuropathic pain: Continue Gabapentin to 300mg  TID  4. Muscle Spasms. Continue: Tizanidine 5. Neurogenic bowel and bladder- Continue to monitor 6. Constipation/Opioid Therapy: Continue Linzess 7. Depression: Continue Celexa 8. Bilateral  Greater Trochanteric Bursitis: Continue with Heat/Ice Therapy.  9. Mild right RTC strain--exercises were provided.  20 minutes of face to face patient care time was spent during this visit. All questions were encouraged and answered.

## 2015-10-09 NOTE — Patient Instructions (Addendum)
WORK ON YOUR POSTURE AND POSITIONING EVERY DAY.[name] ACTIVE.   PLEASE CALL ME WITH ANY PROBLEMS OR QUESTIONS [phone])  Impingement Syndrome, Rotator Cuff, Bursitis With Rehab Impingement syndrome is a condition that involves inflammation of the tendons of the rotator cuff and the subacromial bursa, that causes pain in the shoulder. The rotator cuff consists of four tendons and muscles that control much of the shoulder and upper arm function. The subacromial bursa is a fluid filled sac that helps reduce friction between the rotator cuff and one of the bones of the shoulder (acromion). Impingement syndrome is usually an overuse injury that causes swelling of the bursa (bursitis), swelling of the tendon (tendonitis), and/or a tear of the tendon (strain). Strains are classified into three categories. Grade 1 strains cause pain, but the tendon is not lengthened. Grade 2 strains include a lengthened ligament, due to the ligament being stretched or partially ruptured. With grade 2 strains there is still function, although the function may be decreased. Grade 3 strains include a complete tear of the tendon or muscle, and function is usually impaired. SYMPTOMS   Pain around the shoulder, often at the outer portion of the upper arm.  Pain that gets worse with shoulder function, especially when reaching overhead or lifting.  Sometimes, aching when not using the arm.  Pain that wakes you up at night.  Sometimes, tenderness, swelling, warmth, or redness over the affected area.  Loss of strength.  Limited motion of the shoulder, especially reaching behind the back (to the back pocket or to unhook bra) or across your body.  Crackling sound (crepitation) when moving the arm.  Biceps tendon pain and inflammation (in the front of the shoulder). Worse when bending the elbow or lifting. CAUSES  Impingement syndrome is often an overuse injury, in which chronic (repetitive) motions cause the tendons or  bursa to become inflamed. A strain occurs when a force is paced on the tendon or muscle that is greater than it can withstand. Common mechanisms of injury include: Stress from sudden increase in duration, frequency, or intensity of training.  Direct hit (trauma) to the shoulder.  Aging, erosion of the tendon with normal use.  Bony bump on shoulder (acromial spur). RISK INCREASES WITH:  Contact sports (football, wrestling, boxing).  Throwing sports (baseball, tennis, volleyball).  Weightlifting and bodybuilding.  Heavy labor.  Previous injury to the rotator cuff, including impingement.  Poor shoulder strength and flexibility.  Failure to warm up properly before activity.  Inadequate protective equipment.  Old age.  Bony bump on shoulder (acromial spur). PREVENTION   Warm up and stretch properly before activity.  Allow for adequate recovery between workouts.  Maintain physical fitness:  Strength, flexibility, and endurance.  Cardiovascular fitness.  Learn and use proper exercise technique. PROGNOSIS  If treated properly, impingement syndrome usually goes away within 6 weeks. Sometimes surgery is required.  RELATED COMPLICATIONS   Longer healing time if not properly treated, or if not given enough time to heal.  Recurring symptoms, that result in a chronic condition.  Shoulder stiffness, frozen shoulder, or loss of motion.  Rotator cuff tendon tear.  Recurring symptoms, especially if activity is resumed too soon, with overuse, with a direct blow, or when using poor technique. TREATMENT  Treatment first involves the use of ice and medicine, to reduce pain and inflammation. The use of strengthening and stretching exercises may help reduce pain with activity. These exercises may be performed at home or with a therapist. If non-surgical treatment  is unsuccessful after more than 6 months, surgery may be advised. After surgery and rehabilitation, activity is usually  possible in 3 months.  MEDICATION  If pain medicine is needed, nonsteroidal anti-inflammatory medicines (aspirin and ibuprofen), or other minor pain relievers (acetaminophen), are often advised.  Do not take pain medicine for 7 days before surgery.  Prescription pain relievers may be given, if your caregiver thinks they are needed. Use only as directed and only as much as you need.  Corticosteroid injections may be given by your caregiver. These injections should be reserved for the most serious cases, because they may only be given a certain number of times. HEAT AND COLD  Cold treatment (icing) should be applied for 10 to 15 minutes every 2 to 3 hours for inflammation and pain, and immediately after activity that aggravates your symptoms. Use ice packs or an ice massage.  Heat treatment may be used before performing stretching and strengthening activities prescribed by your caregiver, physical therapist, or athletic trainer. Use a heat pack or a warm water soak. SEEK MEDICAL CARE IF:   Symptoms get worse or do not improve in 4 to 6 weeks, despite treatment.  New, unexplained symptoms develop. (Drugs used in treatment may produce side effects.) EXERCISES  RANGE OF MOTION (ROM) AND STRETCHING EXERCISES - Impingement Syndrome (Rotator Cuff  Tendinitis, Bursitis) These exercises may help you when beginning to rehabilitate your injury. Your symptoms may go away with or without further involvement from your physician, physical therapist or athletic trainer. While completing these exercises, remember:   Restoring tissue flexibility helps normal motion to return to the joints. This allows healthier, less painful movement and activity.  An effective stretch should be held for at least 30 seconds.  A stretch should never be painful. You should only feel a gentle lengthening or release in the stretched tissue. STRETCH - Flexion, Standing  Stand with good posture. With an underhand grip on your  right / left hand, and an overhand grip on the opposite hand, grasp a broomstick or cane so that your hands are a little more than shoulder width apart.  Keeping your right / left elbow straight and shoulder muscles relaxed, push the stick with your opposite hand, to raise your right / left arm in front of your body and then overhead. Raise your arm until you feel a stretch in your right / left shoulder, but before you have increased shoulder pain.  Try to avoid shrugging your right / left shoulder as your arm rises, by keeping your shoulder blade tucked down and toward your mid-back spine. Hold for __________ seconds.  Slowly return to the starting position. Repeat __________ times. Complete this exercise __________ times per day. STRETCH - Abduction, Supine  Lie on your back. With an underhand grip on your right / left hand and an overhand grip on the opposite hand, grasp a broomstick or cane so that your hands are a little more than shoulder width apart.  Keeping your right / left elbow straight and your shoulder muscles relaxed, push the stick with your opposite hand, to raise your right / left arm out to the side of your body and then overhead. Raise your arm until you feel a stretch in your right / left shoulder, but before you have increased shoulder pain.  Try to avoid shrugging your right / left shoulder as your arm rises, by keeping your shoulder blade tucked down and toward your mid-back spine. Hold for __________ seconds.  Slowly return  to the starting position. Repeat __________ times. Complete this exercise __________ times per day. ROM - Flexion, Active-Assisted  Lie on your back. You may bend your knees for comfort.  Grasp a broomstick or cane so your hands are about shoulder width apart. Your right / left hand should grip the end of the stick, so that your hand is positioned "thumbs-up," as if you were about to shake hands.  Using your healthy arm to lead, raise your right /  left arm overhead, until you feel a gentle stretch in your shoulder. Hold for __________ seconds.  Use the stick to assist in returning your right / left arm to its starting position. Repeat __________ times. Complete this exercise __________ times per day.  ROM - Internal Rotation, Supine   Lie on your back on a firm surface. Place your right / left elbow about 60 degrees away from your side. Elevate your elbow with a folded towel, so that the elbow and shoulder are the same height.  Using a broomstick or cane and your strong arm, pull your right / left hand toward your body until you feel a gentle stretch, but no increase in your shoulder pain. Keep your shoulder and elbow in place throughout the exercise.  Hold for __________ seconds. Slowly return to the starting position. Repeat __________ times. Complete this exercise __________ times per day. STRETCH - Internal Rotation  Place your right / left hand behind your back, palm up.  Throw a towel or belt over your opposite shoulder. Grasp the towel with your right / left hand.  While keeping an upright posture, gently pull up on the towel, until you feel a stretch in the front of your right / left shoulder.  Avoid shrugging your right / left shoulder as your arm rises, by keeping your shoulder blade tucked down and toward your mid-back spine.  Hold for __________ seconds. Release the stretch, by lowering your healthy hand. Repeat __________ times. Complete this exercise __________ times per day. ROM - Internal Rotation   Using an underhand grip, grasp a stick behind your back with both hands.  While standing upright with good posture, slide the stick up your back until you feel a mild stretch in the front of your shoulder.  Hold for __________ seconds. Slowly return to your starting position. Repeat __________ times. Complete this exercise __________ times per day.  STRETCH - Posterior Shoulder Capsule   Stand or sit with good  posture. Grasp your right / left elbow and draw it across your chest, keeping it at the same height as your shoulder.  Pull your elbow, so your upper arm comes in closer to your chest. Pull until you feel a gentle stretch in the back of your shoulder.  Hold for __________ seconds. Repeat __________ times. Complete this exercise __________ times per day. STRENGTHENING EXERCISES - Impingement Syndrome (Rotator Cuff Tendinitis, Bursitis) These exercises may help you when beginning to rehabilitate your injury. They may resolve your symptoms with or without further involvement from your physician, physical therapist or athletic trainer. While completing these exercises, remember:  Muscles can gain both the endurance and the strength needed for everyday activities through controlled exercises.  Complete these exercises as instructed by your physician, physical therapist or athletic trainer. Increase the resistance and repetitions only as guided.  You may experience muscle soreness or fatigue, but the pain or discomfort you are trying to eliminate should never worsen during these exercises. If this pain does get worse, stop and  make sure you are following the directions exactly. If the pain is still present after adjustments, discontinue the exercise until you can discuss the trouble with your clinician.  During your recovery, avoid activity or exercises which involve actions that place your injured hand or elbow above your head or behind your back or head. These positions stress the tissues which you are trying to heal. STRENGTH - Scapular Depression and Adduction   With good posture, sit on a firm chair. Support your arms in front of you, with pillows, arm rests, or on a table top. Have your elbows in line with the sides of your body.  Gently draw your shoulder blades down and toward your mid-back spine. Gradually increase the tension, without tensing the muscles along the top of your shoulders and  the back of your neck.  Hold for __________ seconds. Slowly release the tension and relax your muscles completely before starting the next repetition.  After you have practiced this exercise, remove the arm support and complete the exercise in standing as well as sitting position. Repeat __________ times. Complete this exercise __________ times per day.  STRENGTH - Shoulder Abductors, Isometric  With good posture, stand or sit about 4-6 inches from a wall, with your right / left side facing the wall.  Bend your right / left elbow. Gently press your right / left elbow into the wall. Increase the pressure gradually, until you are pressing as hard as you can, without shrugging your shoulder or increasing any shoulder discomfort.  Hold for __________ seconds.  Release the tension slowly. Relax your shoulder muscles completely before you begin the next repetition. Repeat __________ times. Complete this exercise __________ times per day.  STRENGTH - External Rotators, Isometric  Keep your right / left elbow at your side and bend it 90 degrees.  Step into a door frame so that the outside of your right / left wrist can press against the door frame without your upper arm leaving your side.  Gently press your right / left wrist into the door frame, as if you were trying to swing the back of your hand away from your stomach. Gradually increase the tension, until you are pressing as hard as you can, without shrugging your shoulder or increasing any shoulder discomfort.  Hold for __________ seconds.  Release the tension slowly. Relax your shoulder muscles completely before you begin the next repetition. Repeat __________ times. Complete this exercise __________ times per day.  STRENGTH - Supraspinatus   Stand or sit with good posture. Grasp a __________ weight, or an exercise band or tubing, so that your hand is "thumbs-up," like you are shaking hands.  Slowly lift your right / left arm in a "V"  away from your thigh, diagonally into the space between your side and straight ahead. Lift your hand to shoulder height or as far as you can, without increasing any shoulder pain. At first, many people do not lift their hands above shoulder height.  Avoid shrugging your right / left shoulder as your arm rises, by keeping your shoulder blade tucked down and toward your mid-back spine.  Hold for __________ seconds. Control the descent of your hand, as you slowly return to your starting position. Repeat __________ times. Complete this exercise __________ times per day.  STRENGTH - External Rotators  Secure a rubber exercise band or tubing to a fixed object (table, pole) so that it is at the same height as your right / left elbow when you are standing  or sitting on a firm surface.  Stand or sit so that the secured exercise band is at your uninjured side.  Bend your right / left elbow 90 degrees. Place a folded towel or small pillow under your right / left arm, so that your elbow is a few inches away from your side.  Keeping the tension on the exercise band, pull it away from your body, as if pivoting on your elbow. Be sure to keep your body steady, so that the movement is coming only from your rotating shoulder.  Hold for __________ seconds. Release the tension in a controlled manner, as you return to the starting position. Repeat __________ times. Complete this exercise __________ times per day.  STRENGTH - Internal Rotators   Secure a rubber exercise band or tubing to a fixed object (table, pole) so that it is at the same height as your right / left elbow when you are standing or sitting on a firm surface.  Stand or sit so that the secured exercise band is at your right / left side.  Bend your elbow 90 degrees. Place a folded towel or small pillow under your right / left arm so that your elbow is a few inches away from your side.  Keeping the tension on the exercise band, pull it across your  body, toward your stomach. Be sure to keep your body steady, so that the movement is coming only from your rotating shoulder.  Hold for __________ seconds. Release the tension in a controlled manner, as you return to the starting position. Repeat __________ times. Complete this exercise __________ times per day.  STRENGTH - Scapular Protractors, Standing   Stand arms length away from a wall. Place your hands on the wall, keeping your elbows straight.  Begin by dropping your shoulder blades down and toward your mid-back spine.  To strengthen your protractors, keep your shoulder blades down, but slide them forward on your rib cage. It will feel as if you are lifting the back of your rib cage away from the wall. This is a subtle motion and can be challenging to complete. Ask your caregiver for further instruction, if you are not sure you are doing the exercise correctly.  Hold for __________ seconds. Slowly return to the starting position, resting the muscles completely before starting the next repetition. Repeat __________ times. Complete this exercise __________ times per day. STRENGTH - Scapular Protractors, Supine  Lie on your back on a firm surface. Extend your right / left arm straight into the air while holding a __________ weight in your hand.  Keeping your head and back in place, lift your shoulder off the floor.  Hold for __________ seconds. Slowly return to the starting position, and allow your muscles to relax completely before starting the next repetition. Repeat __________ times. Complete this exercise __________ times per day. STRENGTH - Scapular Protractors, Quadruped  Get onto your hands and knees, with your shoulders directly over your hands (or as close as you can be, comfortably).  Keeping your elbows locked, lift the back of your rib cage up into your shoulder blades, so your mid-back rounds out. Keep your neck muscles relaxed.  Hold this position for __________ seconds.  Slowly return to the starting position and allow your muscles to relax completely before starting the next repetition. Repeat __________ times. Complete this exercise __________ times per day.  STRENGTH - Scapular Retractors  Secure a rubber exercise band or tubing to a fixed object (table, pole), so that  it is at the height of your shoulders when you are either standing, or sitting on a firm armless chair.  With a palm down grip, grasp an end of the band in each hand. Straighten your elbows and lift your hands straight in front of you, at shoulder height. Step back, away from the secured end of the band, until it becomes tense.  Squeezing your shoulder blades together, draw your elbows back toward your sides, as you bend them. Keep your upper arms lifted away from your body throughout the exercise.  Hold for __________ seconds. Slowly ease the tension on the band, as you reverse the directions and return to the starting position. Repeat __________ times. Complete this exercise __________ times per day. STRENGTH - Shoulder Extensors   Secure a rubber exercise band or tubing to a fixed object (table, pole) so that it is at the height of your shoulders when you are either standing, or sitting on a firm armless chair.  With a thumbs-up grip, grasp an end of the band in each hand. Straighten your elbows and lift your hands straight in front of you, at shoulder height. Step back, away from the secured end of the band, until it becomes tense.  Squeezing your shoulder blades together, pull your hands down to the sides of your thighs. Do not allow your hands to go behind you.  Hold for __________ seconds. Slowly ease the tension on the band, as you reverse the directions and return to the starting position. Repeat __________ times. Complete this exercise __________ times per day.  STRENGTH - Scapular Retractors and External Rotators   Secure a rubber exercise band or tubing to a fixed object (table,  pole) so that it is at the height as your shoulders, when you are either standing, or sitting on a firm armless chair.  With a palm down grip, grasp an end of the band in each hand. Bend your elbows 90 degrees and lift your elbows to shoulder height, at your sides. Step back, away from the secured end of the band, until it becomes tense.  Squeezing your shoulder blades together, rotate your shoulders so that your upper arms and elbows remain stationary, but your fists travel upward to head height.  Hold for __________ seconds. Slowly ease the tension on the band, as you reverse the directions and return to the starting position. Repeat __________ times. Complete this exercise __________ times per day.  STRENGTH - Scapular Retractors and External Rotators, Rowing   Secure a rubber exercise band or tubing to a fixed object (table, pole) so that it is at the height of your shoulders, when you are either standing, or sitting on a firm armless chair.  With a palm down grip, grasp an end of the band in each hand. Straighten your elbows and lift your hands straight in front of you, at shoulder height. Step back, away from the secured end of the band, until it becomes tense.  Step 1: Squeeze your shoulder blades together. Bending your elbows, draw your hands to your chest, as if you are rowing a boat. At the end of this motion, your hands and elbow should be at shoulder height and your elbows should be out to your sides.  Step 2: Rotate your shoulders, to raise your hands above your head. Your forearms should be vertical and your upper arms should be horizontal.  Hold for __________ seconds. Slowly ease the tension on the band, as you reverse the directions and return to  the starting position. Repeat __________ times. Complete this exercise __________ times per day.  STRENGTH - Scapular Depressors  Find a sturdy chair without wheels, such as a dining room chair.  Keeping your feet on the floor, and  your hands on the chair arms, lift your bottom up from the seat, and lock your elbows.  Keeping your elbows straight, allow gravity to pull your body weight down. Your shoulders will rise toward your ears.  Raise your body against gravity by drawing your shoulder blades down your back, shortening the distance between your shoulders and ears. Although your feet should always maintain contact with the floor, your feet should progressively support less body weight, as you get stronger.  Hold for __________ seconds. In a controlled and slow manner, lower your body weight to begin the next repetition. Repeat __________ times. Complete this exercise __________ times per day.    This information is not intended to replace advice given to you by your health care provider. Make sure you discuss any questions you have with your health care provider.   Document Released: 03/09/2005 Document Revised: 03/30/2014 Document Reviewed: 06/21/2008 [employer] Interactive Patient Education [employer].

## 2015-11-08 ENCOUNTER — Encounter: Payer: Self-pay | Attending: Physical Medicine and Rehabilitation | Admitting: Registered Nurse

## 2015-11-08 ENCOUNTER — Encounter: Payer: Self-pay | Admitting: Registered Nurse

## 2015-11-08 VITALS — BP 107/71 | HR 105

## 2015-11-08 DIAGNOSIS — M5126 Other intervertebral disc displacement, lumbar region: Secondary | ICD-10-CM | POA: Insufficient documentation

## 2015-11-08 DIAGNOSIS — M5416 Radiculopathy, lumbar region: Secondary | ICD-10-CM

## 2015-11-08 DIAGNOSIS — M48061 Spinal stenosis, lumbar region without neurogenic claudication: Secondary | ICD-10-CM

## 2015-11-08 DIAGNOSIS — M4806 Spinal stenosis, lumbar region: Secondary | ICD-10-CM | POA: Insufficient documentation

## 2015-11-08 DIAGNOSIS — G894 Chronic pain syndrome: Secondary | ICD-10-CM

## 2015-11-08 DIAGNOSIS — F329 Major depressive disorder, single episode, unspecified: Secondary | ICD-10-CM

## 2015-11-08 DIAGNOSIS — G834 Cauda equina syndrome: Secondary | ICD-10-CM | POA: Insufficient documentation

## 2015-11-08 DIAGNOSIS — M62830 Muscle spasm of back: Secondary | ICD-10-CM

## 2015-11-08 DIAGNOSIS — F32A Depression, unspecified: Secondary | ICD-10-CM

## 2015-11-08 DIAGNOSIS — M7062 Trochanteric bursitis, left hip: Secondary | ICD-10-CM

## 2015-11-08 DIAGNOSIS — M7061 Trochanteric bursitis, right hip: Secondary | ICD-10-CM

## 2015-11-08 MED ORDER — OXYCODONE-ACETAMINOPHEN 10-325 MG PO TABS: 1.0000 | ORAL_TABLET | Freq: Four times a day (QID) | ORAL | 0 refills | Status: DC | PRN

## 2015-11-08 NOTE — Progress Notes (Signed)
Subjective:    Patient ID: [name], female    DOB: Feb 03, 1971, 45 y.o.   MRN: [id number]  HPI:  [name] is a 45 year old female who returns for follow up for chronic pain and medication refill. She states her pain is located in her right arm, mid-lower back radiating into right hip and right lower extremity posteriorly. She rates her pain 10. Her current exercise regime is attending water aerobics at the [employer] twice a week and  walking short distances with cane. Also states she has noticed increase intensity of  pain since her  MS Contin, has been held due to financial hardship. [name] awaiting on patient assistance program approval. Will increase her Oxycodone this month, with the hope her application will be approved for the MS Contin. She verbalizes understanding. Son in the room.   Pain Inventory Average Pain 9 Pain Right Now 10 My pain is sharp, burning, dull, stabbing, tingling and aching  In the last 24 hours, has pain interfered with the following? General activity 9 Relation with others 9 Enjoyment of life 9 What TIME of day is your pain at its worst? all times Sleep (in general) Poor  Pain is worse with: walking, bending, sitting and standing Pain improves with: medication Relief from Meds: 4  Mobility use a cane ability to climb steps?  no do you drive?  no  Function not employed: date last employed 6/13 I need assistance with the following:  dressing, household duties and shopping  Neuro/Psych numbness tingling trouble walking spasms dizziness  Prior Studies Any changes since last visit?  no  Physicians involved in your care Any changes since last visit?  no   Family History  Problem Relation Age of Onset  . Hypertension Maternal Grandmother   . Anesthesia problems Neg Hx    Social History   Social History  . Marital status: Divorced    Spouse name: N/A  . Number of children: N/A  . Years of education: N/A    Social History Main Topics  . Smoking status: Never Smoker  . Smokeless tobacco: Never Used  . Alcohol use No  . Drug use: No  . Sexual activity: Not Currently    Birth control/ protection: None     Comment: IUD removed 2 wks ago.   Other Topics Concern  . None   Social History Narrative  . None   Past Surgical History:  Procedure Laterality Date  . ABDOMINAL HYSTERECTOMY  06/29/11   Robotic Assisted Hysterectomy  . CERVICAL SPINE SURGERY  [place]  . LUMBAR LAMINECTOMY  08/31/2011   Procedure: MICRODISCECTOMY LUMBAR LAMINECTOMY;  Surgeon: Brooke Bastos, MD;  Location: [facility];  Service: Orthopedics;  Laterality: N/A;  lumbar five sacral one central microdisectomy  . MYOMECTOMY  13  . NECK SURGERY    . POSTERIOR CERVICAL FUSION/FORAMINOTOMY N/A 02/02/2013   Procedure: POSTERIOR C5-6 SPINAL FUSION;  Surgeon: Brooke Schools, MD;  Location: [facility];  Service: Orthopedics;  Laterality: N/A;  . POSTERIOR FUSION CERVICAL SPINE  02/02/2013   C 5 C6     Dr [name]  . WISDOM TOOTH EXTRACTION     Past Medical History:  Diagnosis Date  . Anemia    hx  . Arthritis   . Complication of anesthesia    slow to wake up,   very difficult to get iv had picc line last time  . Difficult intravenous access  hands are usually best for blood draws and IV starts  . DVT (deep venous thrombosis) ([name])    ? vs lupus  . Fibroids   . Headache(784.0)   . Neck pain   . Scoliosis    BP 107/71   Pulse (!) 105   LMP 02/27/2011   SpO2 95%   Opioid Risk Score:   Fall Risk Score:  `1  Depression screen PHQ 2/9  Depression screen [name] 2/9 06/11/2015 08/27/2014 06/27/2014 05/22/2014  Decreased Interest 0 0 3 0  Down, Depressed, Hopeless 2 0 1 0  PHQ - 2 Score 2 0 4 0  Altered sleeping 3 - 3 -  Tired, decreased energy 3 - 3 -  Change in appetite 3 - 3 -  Feeling bad or failure about yourself  2 - 1 -  Trouble concentrating 2 - 0 -  Moving slowly or fidgety/restless 2 - 0 -   Suicidal thoughts 0 - 0 -  PHQ-9 Score 17 - 14 -  Difficult doing work/chores Very difficult - - -    Review of Systems  Constitutional: Negative.   HENT: Negative.   Eyes: Negative.   Respiratory: Negative.   Cardiovascular: Negative.   Gastrointestinal: Positive for abdominal pain, constipation and nausea.  Endocrine: Negative.   Genitourinary: Negative.   Musculoskeletal: Positive for joint swelling.  Skin: Negative.   Allergic/Immunologic: Negative.   Neurological: Positive for dizziness and numbness.  Hematological: Negative.   Psychiatric/Behavioral: Negative.        Objective:   Physical Exam  Constitutional: She is oriented to person, place, and time. She appears well-developed and well-nourished.  HENT:  Head: Normocephalic and atraumatic.  Neck: Normal range of motion. Neck supple.  Cardiovascular: Normal rate and regular rhythm.   Pulmonary/Chest: Effort normal and breath sounds normal.  Musculoskeletal:  Normal Muscle Bulk and Muscle Testing Reveals: Upper Extremities: Full ROM and Muscle Strength 5/5 Thoracic Paraspinal Tenderness: T-1- T-3 Right Rhomboid Tenderness Lumbar Paraspinal Tenderness: L-3- L-5 Right Greater Trochanteric Tenderness Lower Extremities: Full ROM and Muscle Strength 5/5 Arises from table slowly Narrow Based Gait    Neurological: She is alert and oriented to person, place, and time.  Skin: Skin is warm and dry.  Psychiatric: She has a normal mood and affect.  Nursing note and vitals reviewed.         Assessment & Plan:  1. 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 Status post decompressive laminectomy:  Refilled: Oxycodone 10mg /325 mg 1 tablet every 6 hours #120.  We will continue the opioid monitoring program,this consists of regular clinic visits, examinations, urine drug screen,pill counts as well as use of [place] Controlled Substance Reporting System. 2. Insomnia: On elavil 100 mg hs.  3. Neuropathic  pain: Continue Gabapentin to 300mg  TID  4. Muscle Spasms. Continue: Tizanidine 5. Neurogenic bowel and bladder- Continue to monitor 6. Constipation/Opioid Therapy: Continue Linzess 7. Depression: Continue Celexa 8. Bilateral Greater Trochanteric Bursitis: Continue with Heat/Ice Therapy.   20 minutes of face to face patient care time was spent during this visit. All questions were encouraged and answered.

## 2015-11-18 ENCOUNTER — Telehealth: Payer: Self-pay | Admitting: Physical Medicine & Rehabilitation

## 2015-11-18 NOTE — Telephone Encounter (Signed)
done

## 2015-11-18 NOTE — Telephone Encounter (Signed)
[facility] pharmacy needs to get a diagnosis code on a prescription for percocet that was just dropped off by patient.  They are required to get diagnosis codes.  Please call them at [phone].

## 2015-12-03 ENCOUNTER — Encounter: Payer: Self-pay | Admitting: Registered Nurse

## 2015-12-03 ENCOUNTER — Encounter: Payer: Self-pay | Attending: Physical Medicine and Rehabilitation | Admitting: Registered Nurse

## 2015-12-03 VITALS — BP 128/87 | HR 87 | Resp 16

## 2015-12-03 DIAGNOSIS — M5416 Radiculopathy, lumbar region: Secondary | ICD-10-CM

## 2015-12-03 DIAGNOSIS — M7062 Trochanteric bursitis, left hip: Secondary | ICD-10-CM

## 2015-12-03 DIAGNOSIS — M48061 Spinal stenosis, lumbar region without neurogenic claudication: Secondary | ICD-10-CM

## 2015-12-03 DIAGNOSIS — F32A Depression, unspecified: Secondary | ICD-10-CM

## 2015-12-03 DIAGNOSIS — M62830 Muscle spasm of back: Secondary | ICD-10-CM

## 2015-12-03 DIAGNOSIS — G834 Cauda equina syndrome: Secondary | ICD-10-CM

## 2015-12-03 DIAGNOSIS — M5126 Other intervertebral disc displacement, lumbar region: Secondary | ICD-10-CM

## 2015-12-03 DIAGNOSIS — M7061 Trochanteric bursitis, right hip: Secondary | ICD-10-CM

## 2015-12-03 DIAGNOSIS — Z5181 Encounter for therapeutic drug level monitoring: Secondary | ICD-10-CM

## 2015-12-03 DIAGNOSIS — Z79899 Other long term (current) drug therapy: Secondary | ICD-10-CM

## 2015-12-03 DIAGNOSIS — G894 Chronic pain syndrome: Secondary | ICD-10-CM

## 2015-12-03 DIAGNOSIS — F329 Major depressive disorder, single episode, unspecified: Secondary | ICD-10-CM

## 2015-12-03 DIAGNOSIS — M4806 Spinal stenosis, lumbar region: Secondary | ICD-10-CM

## 2015-12-03 MED ORDER — OXYCODONE-ACETAMINOPHEN 10-325 MG PO TABS: 1.0000 | ORAL_TABLET | Freq: Every day | ORAL | 0 refills | Status: DC | PRN

## 2015-12-03 NOTE — Progress Notes (Signed)
Subjective:    Patient ID: Brooke Brooke, female    DOB: 1970/10/26, 45 y.o.   MRN: [id number]  HPI: Brooke Brooke is a 45 year old female who returns for follow up for chronic pain and medication refill. She states her pain is located in her lower back radiating into her bilateral hips and lower extremities posteriorly and anteriorly. She rates her pain 10. Also states her pain has intensified without being on the Morphine, Brooke Brooke is awaiting Brooke Brooke from the patient assistance program regarding her application, will increase her tablets of Brooke this month and re-evaluate next month. She verbalizes understanding.  Her current exercise regime is  walking short distances with cane.  Brooke Brooke doesn't have her medication, her son was in a MVA and medication was in the car, according Brooke Brooke was picked up on 11/18/15.  Pain Inventory Average Pain 10 Pain Right Now 10 My pain is sharp, stabbing, tingling and aching  In the last 24 hours, has pain interfered with the following? General activity 8 Relation with others 8 Enjoyment of life 9 What TIME of day is your pain at its worst? all Sleep (in general) Poor  Pain is worse with: walking, bending, sitting and standing Pain improves with: heat/ice, therapy/exercise and medication Relief from Meds: 5  Mobility use a cane ability Brooke climb steps?  no do you drive?  no  Function not employed: date last employed . I need assistance with the following:  dressing, bathing, household duties and shopping  Neuro/Psych trouble walking spasms  Prior Studies Any changes since last visit?  no  Physicians involved in your care Any changes since last visit?  no   Family History  Problem Relation Age of Onset  . Hypertension Maternal Grandmother   . Anesthesia problems Neg Hx    Social History   Social History  . Marital status: Divorced    Spouse name: N/A  . Number of children: N/A  . Years of  education: N/A   Social History Main Topics  . Smoking status: Never Smoker  . Smokeless tobacco: Never Used  . Alcohol use No  . Drug use: No  . Sexual activity: Not Currently    Birth control/ protection: None     Comment: IUD removed 2 wks ago.   Other Topics Concern  . None   Social History Narrative  . None   Past Surgical History:  Procedure Laterality Date  . ABDOMINAL HYSTERECTOMY  06/29/11   Robotic Assisted Hysterectomy  . CERVICAL SPINE SURGERY  [place]  . LUMBAR LAMINECTOMY  08/31/2011   Procedure: MICRODISCECTOMY LUMBAR LAMINECTOMY;  Surgeon: Tobi Bastos, MD;  Location: [facility];  Service: Orthopedics;  Laterality: N/A;  lumbar five sacral one central microdisectomy  . MYOMECTOMY  13  . NECK SURGERY    . POSTERIOR CERVICAL FUSION/FORAMINOTOMY N/A 02/02/2013   Procedure: POSTERIOR C5-6 SPINAL FUSION;  Surgeon: Melina Schools, MD;  Location: [facility];  Service: Orthopedics;  Laterality: N/A;  . POSTERIOR FUSION CERVICAL SPINE  02/02/2013   C 5 C6     [name]  . WISDOM TOOTH EXTRACTION     Past Medical History:  Diagnosis Date  . Anemia    hx  . Arthritis   . Complication of anesthesia    slow Brooke wake up,   very difficult Brooke get iv had picc line last time  . Difficult intravenous access  hands are usually best for blood draws and IV starts  . DVT (deep venous thrombosis) ([place])    ? vs lupus  . Fibroids   . Headache(784.0)   . Neck pain   . Scoliosis    BP 128/87 (BP Location: Left Arm, Patient Position: Sitting, Cuff Size: Large)   Pulse 87   Resp 16   LMP 02/27/2011   SpO2 98%   Opioid Risk Score:   Fall Risk Score:  `1  Depression screen PHQ 2/9  Depression screen [facility] 2/9 12/03/2015 06/11/2015 08/27/2014 06/27/2014 05/22/2014  Decreased Interest 0 0 0 3 0  Down, Depressed, Hopeless 2 2 0 1 0  PHQ - 2 Score 2 2 0 4 0  Altered sleeping - 3 - 3 -  Tired, decreased energy - 3 - 3 -  Change in appetite - 3 - 3 -  Feeling bad or  failure about yourself  - 2 - 1 -  Trouble concentrating - 2 - 0 -  Moving slowly or fidgety/restless - 2 - 0 -  Suicidal thoughts - 0 - 0 -  PHQ-9 Score - 17 - 14 -  Difficult doing work/chores - Very difficult - - -    Review of Systems  All other systems reviewed and are negative.      Objective:   Physical Exam  Constitutional: She is oriented Brooke person, place, and time. She appears well-developed and well-nourished.  HENT:  Head: Normocephalic and atraumatic.  Neck: Normal range of motion. Neck supple.  Cardiovascular: Normal rate and regular rhythm.   Pulmonary/Chest: Effort normal and breath sounds normal.  Musculoskeletal:  Normal Muscle Bulk and Muscle Testing Reveals: Upper Extremities: Full ROM and Muscle Strength 5/5 Lumbar Hypersensitivity Bilateral Greater Trochanteric Tenderness Lower Extremities: Full ROM and Muscle Strength 5/5 Bilateral Lower Extremity Flexion Produces Pain into Lumbar and Lower Extremities Arises from Table slowlyy using straight cane Antalgic Gait  Neurological: She is alert and oriented Brooke person, place, and time.  Skin: Skin is warm and dry.  Psychiatric: She has a normal mood and affect.  Nursing note and vitals reviewed.         Assessment & Plan:  1. Cauda equina syndrome due Brooke Brooke. Status post decompressive laminectomy:  Refilled: Increased: Brooke 10mg /325 mg 1 tablet five times a day # 150.Marland Kitchen  We will continue the opioid monitoring program,this consists of regular clinic visits, examinations, urine drug screen,pill counts as well as use of [place] Controlled Substance Reporting System. 2. Insomnia: On elavil 100 mg hs.  3. Neuropathic pain: Continue Gabapentin Brooke Brooke  TID  4. Muscle Spasms. Continue: Tizanidine 5. Neurogenic bowel and bladder- Continue Brooke Brooke 6. Constipation/Opioid Therapy: Continue Linzess 7. Depression: Continue Celexa 8. Bilateral Greater Trochanteric Bursitis:  Continue with Heat/Ice Therapy.   20 minutes of face Brooke face patient care time was spent during this visit. All questions were encouraged and answered.

## 2015-12-09 ENCOUNTER — Ambulatory Visit: Payer: Self-pay | Admitting: Physical Medicine & Rehabilitation

## 2016-01-14 ENCOUNTER — Encounter: Payer: Self-pay | Attending: Physical Medicine and Rehabilitation | Admitting: Registered Nurse

## 2016-01-14 ENCOUNTER — Encounter: Payer: Self-pay | Admitting: Registered Nurse

## 2016-01-14 VITALS — BP 131/81 | HR 79 | Resp 14

## 2016-01-14 DIAGNOSIS — G834 Cauda equina syndrome: Secondary | ICD-10-CM

## 2016-01-14 DIAGNOSIS — M7061 Trochanteric bursitis, right hip: Secondary | ICD-10-CM

## 2016-01-14 DIAGNOSIS — M62830 Muscle spasm of back: Secondary | ICD-10-CM

## 2016-01-14 DIAGNOSIS — M5416 Radiculopathy, lumbar region: Secondary | ICD-10-CM

## 2016-01-14 DIAGNOSIS — Z79899 Other long term (current) drug therapy: Secondary | ICD-10-CM

## 2016-01-14 DIAGNOSIS — G894 Chronic pain syndrome: Secondary | ICD-10-CM

## 2016-01-14 DIAGNOSIS — M5126 Other intervertebral disc displacement, lumbar region: Secondary | ICD-10-CM

## 2016-01-14 DIAGNOSIS — M7062 Trochanteric bursitis, left hip: Secondary | ICD-10-CM

## 2016-01-14 DIAGNOSIS — F3289 Other specified depressive episodes: Secondary | ICD-10-CM

## 2016-01-14 DIAGNOSIS — Z5181 Encounter for therapeutic drug level monitoring: Secondary | ICD-10-CM

## 2016-01-14 DIAGNOSIS — M48061 Spinal stenosis, lumbar region without neurogenic claudication: Secondary | ICD-10-CM

## 2016-01-14 MED ORDER — OXYCODONE-ACETAMINOPHEN 10-325 MG PO TABS: 1.0000 | ORAL_TABLET | Freq: Every day | ORAL | 0 refills | Status: DC | PRN

## 2016-01-14 NOTE — Progress Notes (Signed)
Subjective:    Patient ID: [name], female    DOB: [birth year], 45 y.o.   MRN: [id number]  HPI: [name] is a 45 year old female who returns for follow up for chronic pain and medication refill. She states her pain is located in her lower back radiating into her bilateral hips left greater than right and lower extremities posteriorly and laterally. She rates her pain 10.   Ms. [name] application for the patient assistance program for MS Contin was denied. She has tried to obtain insurance with no success at this time. She will be placing calls to compare MS Contin prices, will call office when she is ready to resume the MS Contin, she verbalizes understanding.   Pain Inventory Average Pain 10 Pain Right Now 10 My pain is sharp, dull, stabbing, tingling and aching  In the last 24 hours, has pain interfered with the following? General activity 9 Relation with others 10 Enjoyment of life 10 What TIME of day is your pain at its worst? daytime, evening, night Sleep (in general) Poor  Pain is worse with: walking, bending, sitting and standing Pain improves with: n/a Relief from Meds: 2  Mobility use a cane ability to climb steps?  no do you drive?  no  Function not employed: date last employed 2013 I need assistance with the following:  dressing, bathing, household duties and shopping  Neuro/Psych numbness tingling trouble walking spasms  Prior Studies Any changes since last visit?  no  Physicians involved in your care Any changes since last visit?  no   Family History  Problem Relation Age of Onset  . Hypertension Maternal Grandmother   . Anesthesia problems Neg Hx    Social History   Social History  . Marital status: Divorced    Spouse name: N/A  . Number of children: N/A  . Years of education: N/A   Social History Main Topics  . Smoking status: Never Smoker  . Smokeless tobacco: Never Used  . Alcohol use No  . Drug use: No  . Sexual  activity: Not Currently    Birth control/ protection: None     Comment: IUD removed 2 wks ago.   Other Topics Concern  . None   Social History Narrative  . None   Past Surgical History:  Procedure Laterality Date  . ABDOMINAL HYSTERECTOMY  06/29/11   Robotic Assisted Hysterectomy  . CERVICAL SPINE SURGERY  [place]  . LUMBAR LAMINECTOMY  08/31/2011   Procedure: MICRODISCECTOMY LUMBAR LAMINECTOMY;  Surgeon: Tobi Bastos, MD;  Location: [facility];  Service: Orthopedics;  Laterality: N/A;  lumbar five sacral one central microdisectomy  . MYOMECTOMY  13  . NECK SURGERY    . POSTERIOR CERVICAL FUSION/FORAMINOTOMY N/A 02/02/2013   Procedure: POSTERIOR C5-6 SPINAL FUSION;  Surgeon: Melina Schools, MD;  Location: [facility];  Service: Orthopedics;  Laterality: N/A;  . POSTERIOR FUSION CERVICAL SPINE  02/02/2013   C 5 C6     [name]  . WISDOM TOOTH EXTRACTION     Past Medical History:  Diagnosis Date  . Anemia    hx  . Arthritis   . Complication of anesthesia    slow to wake up,   very difficult to get iv had picc line last time  . Difficult intravenous access    hands are usually best for blood draws and IV starts  . DVT (deep venous thrombosis) ([place])    ?  vs lupus  . Fibroids   . Headache(784.0)   . Neck pain   . Scoliosis    BP 131/81   Pulse 79   Resp 14   LMP 02/27/2011   SpO2 94%   Opioid Risk Score:   Fall Risk Score:  `1  Depression screen PHQ 2/9  Depression screen [facility] 2/9 12/03/2015 06/11/2015 08/27/2014 06/27/2014 05/22/2014  Decreased Interest 0 0 0 3 0  Down, Depressed, Hopeless 2 2 0 1 0  PHQ - 2 Score 2 2 0 4 0  Altered sleeping - 3 - 3 -  Tired, decreased energy - 3 - 3 -  Change in appetite - 3 - 3 -  Feeling bad or failure about yourself  - 2 - 1 -  Trouble concentrating - 2 - 0 -  Moving slowly or fidgety/restless - 2 - 0 -  Suicidal thoughts - 0 - 0 -  PHQ-9 Score - 17 - 14 -  Difficult doing work/chores - Very difficult - - -      Review of Systems  All other systems reviewed and are negative.      Objective:   Physical Exam  Constitutional: She is oriented to person, place, and time. She appears well-developed and well-nourished.  HENT:  Head: Normocephalic and atraumatic.  Neck: Normal range of motion. Neck supple.  Cardiovascular: Normal rate and regular rhythm.   Pulmonary/Chest: Effort normal and breath sounds normal.  Musculoskeletal:  Normal Muscle Bulk and Muscle Testing Reveals: Upper Extremities: Full ROM and Muscle Strength 5/5 Lumbar Paraspinal Tenderness Bilateral Greater Trochanteric Tenderness L>R Lower Extremities Full ROM and Muscle Strength 5/5  Arises from Table slowly using straight cane for support Antalgic Gait   Neurological: She is alert and oriented to person, place, and time.  Skin: Skin is warm and dry.  Psychiatric: She has a normal mood and affect.  Nursing note and vitals reviewed.         Assessment & Plan:  1. 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 Status post decompressive laminectomy:  Refilled: Oxycodone 10mg /325 mg 1 tablet five times a day # 150. Second script to accommodate scheduled appointment.  We will continue the opioid monitoring program,this consists of regular clinic visits, examinations, urine drug screen,pill counts as well as use of [place] Controlled Substance Reporting System. 2. Insomnia: On elavil 100 mg hs.  3. Neuropathic pain: Continue Gabapentin to 300mg  TID  4. Muscle Spasms. Continue: Tizanidine 5. Neurogenic bowel and bladder- Continue to monitor 6. Constipation/Opioid Therapy: Continue Linzess 7. Depression: Continue Celexa 8. Bilateral Greater Trochanteric Bursitis:  L>R Scheduled for Cortisone InjectionContinue with Heat/Ice Therapy.   20 minutes of face to face patient care time was spent during this visit. All questions were encouraged and answered.

## 2016-01-20 LAB — TOXASSURE SELECT,+ANTIDEPR,UR

## 2016-01-21 NOTE — Progress Notes (Signed)
Urine drug screen for this encounter is consistent for prescribed medication 

## 2016-02-18 ENCOUNTER — Encounter: Payer: Self-pay | Attending: Physical Medicine and Rehabilitation | Admitting: Physical Medicine & Rehabilitation

## 2016-02-18 ENCOUNTER — Encounter: Payer: Self-pay | Admitting: Physical Medicine & Rehabilitation

## 2016-02-18 VITALS — BP 113/80 | HR 81 | Resp 14

## 2016-02-18 DIAGNOSIS — M48061 Spinal stenosis, lumbar region without neurogenic claudication: Secondary | ICD-10-CM | POA: Insufficient documentation

## 2016-02-18 DIAGNOSIS — M5126 Other intervertebral disc displacement, lumbar region: Secondary | ICD-10-CM | POA: Insufficient documentation

## 2016-02-18 DIAGNOSIS — G834 Cauda equina syndrome: Secondary | ICD-10-CM | POA: Insufficient documentation

## 2016-02-18 DIAGNOSIS — M7062 Trochanteric bursitis, left hip: Secondary | ICD-10-CM

## 2016-02-18 DIAGNOSIS — M7061 Trochanteric bursitis, right hip: Secondary | ICD-10-CM

## 2016-02-18 MED ORDER — OXYCODONE-ACETAMINOPHEN 10-325 MG PO TABS: 1.0000 | ORAL_TABLET | Freq: Every day | ORAL | 0 refills | Status: DC | PRN

## 2016-02-18 NOTE — Progress Notes (Signed)
Hip Injection DX: left greater troch bursitis---  After informed consent and preparation of the skin with betadine and isopropyl alcohol, I injected 6mg  (1cc) of celestone and 4cc of 1% lidocaine around the left greater trochanter via lateral approach. Additionally, aspiration was performed prior to injection. The patient tolerated well, and no complications were encountered. Afterward the area was cleaned and dressed. Post- injection instructions were provided.

## 2016-02-18 NOTE — Patient Instructions (Addendum)
PLEASE CALL ME WITH ANY PROBLEMS OR QUESTIONS [id number])   HAPPY HOLIDAYS!!!!                    *                * *             *   *   *         *  *   *  *  *     *  *  *  *  *  *  * *  *  *  *  *  *  *  *  *  * *               *  *               *  *               *  *    Trochanteric Bursitis Rehab Ask your health care provider which exercises are safe for you. Do exercises exactly as told by your health care provider and adjust them as directed. It is normal to feel mild stretching, pulling, tightness, or discomfort as you do these exercises, but you should stop right away if you feel sudden pain or your pain gets worse.Do not begin these exercises until told by your health care provider. Stretching exercises These exercises warm up your muscles and joints and improve the movement and flexibility of your hip. These exercises also help to relieve pain and stiffness. Exercise A: Iliotibial band stretch 1. Lie on your side with your left / right leg in the top position. 2. Bend your left / right knee and grab your ankle. 3. Slowly bring your knee back so your thigh is behind your body. 4. Slowly lower your knee toward the floor until you feel a gentle stretch on the outside of your left / right thigh. If you do not feel a stretch and your knee will not fall farther, place the heel of your other foot on top of your outer knee and pull your thigh down farther. 5. Hold this position for __________ seconds. 6. Slowly return to the starting position. Repeat __________ times. Complete this exercise __________ times a day. Strengthening exercises These exercises build strength and endurance in your hip and pelvis. Endurance is the ability to use your muscles for a long time, even after they get tired. Exercise B: Bridge (hip extensors) 1. Lie on your back on a firm surface with your knees bent and your feet flat on the floor. 2. Tighten your buttocks muscles and lift your buttocks  off the floor until your trunk is level with your thighs. You should feel the muscles working in your buttocks and the back of your thighs. If this exercise is too easy, try doing it with your arms crossed over your chest. 3. Hold this position for __________ seconds. 4. Slowly return to the starting position. 5. Let your muscles relax completely between repetitions. Repeat __________ times. Complete this exercise __________ times a day. Exercise C: Squats (knee extensors and  quadriceps) 1. Stand in front of a table, with your feet and knees pointing straight ahead. You may rest your hands on the table for balance but not for support. 2. Slowly bend your knees and lower your hips like you are going to sit in a chair.  Keep your weight  over your heels, not over your toes.  Keep your lower legs upright so they are parallel with the table legs.  Do not let your hips go lower than your knees.  Do not bend lower than told by your health care provider.  If your hip pain increases, do not bend as low. 3. Hold this position for __________ seconds. 4. Slowly push with your legs to return to standing. Do not use your hands to pull yourself to standing. Repeat __________ times. Complete this exercise __________ times a day. Exercise D: Hip hike 1. Stand sideways on a bottom step. Stand on your left / right leg with your other foot unsupported next to the step. You can hold onto the railing or wall if needed for balance. 2. Keeping your knees straight and your torso square, lift your left / right hip up toward the ceiling. 3. Hold this position for __________ seconds. 4. Slowly let your left / right hip lower toward the floor, past the starting position. Your foot should get closer to the floor. Do not lean or bend your knees. Repeat __________ times. Complete this exercise __________ times a day. Exercise E: Single leg stand 1. Stand near a counter or door frame that you can hold onto for balance  as needed. It is helpful to stand in front of a mirror for this exercise so you can watch your hip. 2. Squeeze your left / right buttock muscles then lift up your other foot. Do not let your left / right hip push out to the side. 3. Hold this position for __________ seconds. Repeat __________ times. Complete this exercise __________ times a day. This information is not intended to replace advice given to you by your health care provider. Make sure you discuss any questions you have with your health care provider. Document Released: 04/16/2004 Document Revised: 11/14/2015 Document Reviewed: 02/22/2015 Elsevier Interactive Patient Education  2017 [employer].

## 2016-03-20 ENCOUNTER — Telehealth: Payer: Self-pay | Admitting: *Deleted

## 2016-03-20 ENCOUNTER — Encounter: Payer: Self-pay | Admitting: Registered Nurse

## 2016-03-20 ENCOUNTER — Encounter: Payer: Self-pay | Attending: Physical Medicine and Rehabilitation | Admitting: Registered Nurse

## 2016-03-20 VITALS — BP 121/85 | HR 84 | Resp 14

## 2016-03-20 DIAGNOSIS — M48061 Spinal stenosis, lumbar region without neurogenic claudication: Secondary | ICD-10-CM | POA: Insufficient documentation

## 2016-03-20 DIAGNOSIS — M62830 Muscle spasm of back: Secondary | ICD-10-CM

## 2016-03-20 DIAGNOSIS — G894 Chronic pain syndrome: Secondary | ICD-10-CM

## 2016-03-20 DIAGNOSIS — M5126 Other intervertebral disc displacement, lumbar region: Secondary | ICD-10-CM | POA: Insufficient documentation

## 2016-03-20 DIAGNOSIS — M7061 Trochanteric bursitis, right hip: Secondary | ICD-10-CM

## 2016-03-20 DIAGNOSIS — Z5181 Encounter for therapeutic drug level monitoring: Secondary | ICD-10-CM

## 2016-03-20 DIAGNOSIS — M961 Postlaminectomy syndrome, not elsewhere classified: Secondary | ICD-10-CM

## 2016-03-20 DIAGNOSIS — G834 Cauda equina syndrome: Secondary | ICD-10-CM | POA: Insufficient documentation

## 2016-03-20 DIAGNOSIS — F3289 Other specified depressive episodes: Secondary | ICD-10-CM

## 2016-03-20 DIAGNOSIS — M7062 Trochanteric bursitis, left hip: Secondary | ICD-10-CM

## 2016-03-20 DIAGNOSIS — M5416 Radiculopathy, lumbar region: Secondary | ICD-10-CM

## 2016-03-20 DIAGNOSIS — Z79899 Other long term (current) drug therapy: Secondary | ICD-10-CM

## 2016-03-20 MED ORDER — GABAPENTIN 300 MG PO CAPS: 300.0000 mg | ORAL_CAPSULE | Freq: Three times a day (TID) | ORAL | 3 refills | Status: DC

## 2016-03-20 MED ORDER — OXYCODONE-ACETAMINOPHEN 10-325 MG PO TABS: 1.0000 | ORAL_TABLET | Freq: Every day | ORAL | 0 refills | Status: DC | PRN

## 2016-03-20 MED ORDER — AMITRIPTYLINE HCL 100 MG PO TABS: 100.0000 mg | ORAL_TABLET | Freq: Every day | ORAL | 3 refills | Status: DC

## 2016-03-20 MED ORDER — PROMETHAZINE HCL 12.5 MG PO TABS: 12.5000 mg | ORAL_TABLET | Freq: Four times a day (QID) | ORAL | 3 refills | Status: DC | PRN

## 2016-03-20 MED ORDER — MORPHINE SULFATE ER 30 MG PO TBCR: 30.0000 mg | EXTENDED_RELEASE_TABLET | Freq: Two times a day (BID) | ORAL | 0 refills | Status: DC

## 2016-03-20 MED ORDER — TIZANIDINE HCL 4 MG PO TABS: ORAL_TABLET | ORAL | 3 refills | Status: DC

## 2016-03-20 NOTE — Progress Notes (Signed)
Subjective:    Patient ID: [name], female    DOB: June 12, 1970, 45 y.o.   MRN: [id number]  HPI:  [name] is a 45 year old female who returns for follow up appointment for chronic pain and medication refill. She states her pain is located in her lower back radiating into her bilateral hips left greater than rightand bilateral lower extremities laterally. She rates her pain 10.  Her current exercise regime is walking and performing stretching exercises. S/P Left hip injection with relief noted.  [name] will resume MS Contin, she will only fill the prescription based on cost, call was placed to several pharmacies and this writer spoke to the Pharmacist to obtain cost. She was instructed to call office if she's  able to obtain the MS Contin, we will titrate Oxycodone once this occurs to QID. She verbalizes understanding.   Pain Inventory Average Pain 9 Pain Right Now 10 My pain is sharp, stabbing, tingling and aching  In the last 24 hours, has pain interfered with the following? General activity 10 Relation with others 10 Enjoyment of life 10 What TIME of day is your pain at its worst? all Sleep (in general) Poor  Pain is worse with: walking, bending, sitting and standing Pain improves with: medication Relief from Meds: 5  Mobility walk with assistance use a cane ability to climb steps?  no do you drive?  no  Function not employed: date last employed .  Neuro/Psych numbness trouble walking spasms anxiety  Prior Studies Any changes since last visit?  no  Physicians involved in your care Any changes since last visit?  no   Family History  Problem Relation Age of Onset  . Hypertension Maternal Grandmother   . Anesthesia problems Neg Hx    Social History   Social History  . Marital status: Divorced    Spouse name: N/A  . Number of children: N/A  . Years of education: N/A   Social History Main Topics  . Smoking status: Never Smoker  .  Smokeless tobacco: Never Used  . Alcohol use No  . Drug use: No  . Sexual activity: Not Currently    Birth control/ protection: None     Comment: IUD removed 2 wks ago.   Other Topics Concern  . None   Social History Narrative  . None   Past Surgical History:  Procedure Laterality Date  . ABDOMINAL HYSTERECTOMY  06/29/11   Robotic Assisted Hysterectomy  . CERVICAL SPINE SURGERY  [place]  . LUMBAR LAMINECTOMY  08/31/2011   Procedure: MICRODISCECTOMY LUMBAR LAMINECTOMY;  Surgeon: Tobi Bastos, MD;  Location: [facility];  Service: Orthopedics;  Laterality: N/A;  lumbar five sacral one central microdisectomy  . MYOMECTOMY  13  . NECK SURGERY    . POSTERIOR CERVICAL FUSION/FORAMINOTOMY N/A 02/02/2013   Procedure: POSTERIOR C5-6 SPINAL FUSION;  Surgeon: Melina Schools, MD;  Location: [place];  Service: Orthopedics;  Laterality: N/A;  . POSTERIOR FUSION CERVICAL SPINE  02/02/2013   C 5 C6     [name]  . WISDOM TOOTH EXTRACTION     Past Medical History:  Diagnosis Date  . Anemia    hx  . Arthritis   . Complication of anesthesia    slow to wake up,   very difficult to get iv had picc line last time  . Difficult intravenous access    hands are usually best for blood draws and IV  starts  . DVT (deep venous thrombosis) ([place])    ? vs lupus  . Fibroids   . Headache(784.0)   . Neck pain   . Scoliosis    BP 121/85   Pulse 84   Resp 14   LMP 02/27/2011   SpO2 98%   Opioid Risk Score:   Fall Risk Score:  `1  Depression screen PHQ 2/9  Depression screen [facility] 2/9 12/03/2015 06/11/2015 08/27/2014 06/27/2014 05/22/2014  Decreased Interest 0 0 0 3 0  Down, Depressed, Hopeless 2 2 0 1 0  PHQ - 2 Score 2 2 0 4 0  Altered sleeping - 3 - 3 -  Tired, decreased energy - 3 - 3 -  Change in appetite - 3 - 3 -  Feeling bad or failure about yourself  - 2 - 1 -  Trouble concentrating - 2 - 0 -  Moving slowly or fidgety/restless - 2 - 0 -  Suicidal thoughts - 0 - 0 -  PHQ-9  Score - 17 - 14 -  Difficult doing work/chores - Very difficult - - -    Review of Systems  Constitutional: Negative.   HENT: Negative.   Eyes: Negative.   Respiratory: Negative.   Cardiovascular: Negative.   Gastrointestinal: Negative.   Endocrine: Negative.   Genitourinary: Negative.   Musculoskeletal: Positive for gait problem.       Spasms  Skin: Negative.   Allergic/Immunologic: Negative.   Neurological: Positive for numbness.  Hematological: Negative.   Psychiatric/Behavioral: The patient is nervous/anxious.   All other systems reviewed and are negative.      Objective:   Physical Exam  Constitutional: She is oriented to person, place, and time. She appears well-developed and well-nourished.  HENT:  Head: Normocephalic and atraumatic.  Neck: Normal range of motion. Neck supple.  Cardiovascular: Normal rate and regular rhythm.   Pulmonary/Chest: Effort normal and breath sounds normal.  Musculoskeletal:  Normal Muscle Bulk and Muscle Testing Reveals: Upper Extremities: Full ROM and Muscle Strength 5/5 Lumbar Paraspinal Tenderness: L-3-L-5 Bilateral Greater Trochanteric Tenderness L>R Lower Extremities: Full ROM and Muscle Strength 5/5 Bilateral Lower Extremities Flexion Produces Pain into Lumbar and Bilateral Hips Arises from Table slowly Antalgic Gait  Neurological: She is alert and oriented to person, place, and time.  Skin: Skin is warm and dry.  Psychiatric: She has a normal mood and affect.  Nursing note and vitals reviewed.         Assessment & Plan:  1. 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 Status post decompressive laminectomy:  Refilled: Oxycodone 10mg /325 mg 1 tablet five times a day# 150. RX: MS Contin 30 mg every 12 hours #60 We will continue the opioid monitoring program,this consists of regular clinic visits, examinations, urine drug screen,pill counts as well as use of [place] Controlled Substance Reporting System. 2.  Insomnia: On elavil 100 mg hs.  3. Neuropathic pain: Continue Gabapentin to 300mg  TID  4. Muscle Spasms. Continue: Tizanidine 5. Neurogenic bowel and bladder- Continue to monitor 6. Constipation/Opioid Therapy: Continue Linzess 7. Depression: Continue Celexa 8. Bilateral Greater Trochanteric Bursitis:  L>R S/P Cortisone Injection with relief noted. Continue with Heat/Ice Therapy.   40 minutes of face to face patient care time was spent during this visit. All questions were encouraged and answered.

## 2016-03-20 NOTE — Telephone Encounter (Signed)
I spoke with the pharmacist at [facility] and verified that they had only initially filled [place] [facility] for 150 of oxycodone acetaminophen 10/325 with 75 and were going to give her the remainder on the Rx.

## 2016-03-24 ENCOUNTER — Telehealth: Payer: Self-pay | Admitting: Registered Nurse

## 2016-03-24 NOTE — Telephone Encounter (Signed)
Placed a call to Ms. Grall. To follow up oh her Morphine, unable to leave message. Mailbox full.

## 2016-03-30 ENCOUNTER — Telehealth: Payer: Self-pay | Admitting: Registered Nurse

## 2016-03-30 NOTE — Telephone Encounter (Signed)
Placed a call to [name], she will be able to pick up the MS Contin on Friday 04/03/2016 due to financial hardship. Instructed to call office in a week to evaluate medication changes. She verbalizes understanding.

## 2016-04-16 MED FILL — OXYCODONE-APAP 10-325: 10-325 | 30 days supply | Qty: 150 | Fill #0

## 2016-04-20 ENCOUNTER — Encounter: Payer: Self-pay | Admitting: Registered Nurse

## 2016-04-22 ENCOUNTER — Encounter: Payer: Self-pay | Admitting: Registered Nurse

## 2016-05-11 ENCOUNTER — Telehealth: Payer: Self-pay | Admitting: Registered Nurse

## 2016-05-11 ENCOUNTER — Encounter: Payer: Self-pay | Admitting: Registered Nurse

## 2016-05-11 ENCOUNTER — Encounter: Payer: Self-pay | Attending: Physical Medicine and Rehabilitation | Admitting: Registered Nurse

## 2016-05-11 VITALS — BP 123/81 | HR 105

## 2016-05-11 DIAGNOSIS — M48061 Spinal stenosis, lumbar region without neurogenic claudication: Secondary | ICD-10-CM | POA: Insufficient documentation

## 2016-05-11 DIAGNOSIS — M7061 Trochanteric bursitis, right hip: Secondary | ICD-10-CM

## 2016-05-11 DIAGNOSIS — G834 Cauda equina syndrome: Secondary | ICD-10-CM

## 2016-05-11 DIAGNOSIS — M62830 Muscle spasm of back: Secondary | ICD-10-CM

## 2016-05-11 DIAGNOSIS — Z79899 Other long term (current) drug therapy: Secondary | ICD-10-CM

## 2016-05-11 DIAGNOSIS — F3289 Other specified depressive episodes: Secondary | ICD-10-CM

## 2016-05-11 DIAGNOSIS — M5416 Radiculopathy, lumbar region: Secondary | ICD-10-CM

## 2016-05-11 DIAGNOSIS — M961 Postlaminectomy syndrome, not elsewhere classified: Secondary | ICD-10-CM

## 2016-05-11 DIAGNOSIS — M7062 Trochanteric bursitis, left hip: Secondary | ICD-10-CM

## 2016-05-11 DIAGNOSIS — T402X5A Adverse effect of other opioids, initial encounter: Secondary | ICD-10-CM

## 2016-05-11 DIAGNOSIS — K5903 Drug induced constipation: Secondary | ICD-10-CM

## 2016-05-11 DIAGNOSIS — M5126 Other intervertebral disc displacement, lumbar region: Secondary | ICD-10-CM | POA: Insufficient documentation

## 2016-05-11 DIAGNOSIS — G894 Chronic pain syndrome: Secondary | ICD-10-CM

## 2016-05-11 DIAGNOSIS — Z5181 Encounter for therapeutic drug level monitoring: Secondary | ICD-10-CM

## 2016-05-11 MED ORDER — MORPHINE SULFATE ER 30 MG PO TBCR: 30.0000 mg | EXTENDED_RELEASE_TABLET | Freq: Two times a day (BID) | ORAL | 0 refills | Status: DC

## 2016-05-11 MED ORDER — OXYCODONE-ACETAMINOPHEN 10-325 MG PO TABS: 1.0000 | ORAL_TABLET | Freq: Every day | ORAL | 0 refills | Status: DC | PRN

## 2016-05-11 MED ORDER — OXYCODONE HCL 10 MG PO TABS: 10.0000 mg | ORAL_TABLET | Freq: Every day | ORAL | 0 refills | Status: DC | PRN

## 2016-05-11 NOTE — Telephone Encounter (Signed)
Various Pharmacies were called regarding prescription prices. Oxycodone 10 mg tablets are cheaper than Oxycodone 10/325 mg.  [name] will return the Oxycodone 10/325 mg prescription for Oxycodone 10 mg prescription. She verbalizes understanding.

## 2016-05-11 NOTE — Telephone Encounter (Signed)
On 05/11/2016 the [place] was reviewed no conflict was seen on the [place] with multiple prescribers. Ms. Ko has a signed narcotic contract with our office. If there were any discrepancies this would have been reported to her physician.

## 2016-05-11 NOTE — Progress Notes (Signed)
Subjective:    Patient ID: [name], female    DOB: 1970-11-25, 46 y.o.   MRN: [id number]  HPI: [name] is a 46 year old female who returns for follow up appointment for chronic pain and medication refill. She states her pain is located in her lower back radiating into her bilateral hips left greater than rightand bilateral lower extremities laterally. She rates her pain 10.  Her current exercise regime is walking and performing stretching exercises.  [name] wasn't able to pick up the MS contin last month due to cost. Several pharmacies were called today to compare prices. [name] verbalizes understanding.  Pain Inventory Average Pain 10 Pain Right Now 10 My pain is sharp, burning, stabbing, tingling and aching  In the last 24 hours, has pain interfered with the following? General activity 9 Relation with others 10 Enjoyment of life 10 What TIME of day is your pain at its worst? all Sleep (in general) Poor  Pain is worse with: walking, bending, sitting and standing Pain improves with: medication Relief from Meds: 2  Mobility use a cane do you drive?  no  Function not employed: date last employed . I need assistance with the following:  household duties and shopping  Neuro/Psych numbness trouble walking spasms  Prior Studies Any changes since last visit?  no  Physicians involved in your care Any changes since last visit?  no   Family History  Problem Relation Age of Onset  . Hypertension Maternal Grandmother   . Anesthesia problems Neg Hx    Social History   Social History  . Marital status: Divorced    Spouse name: N/A  . Number of children: N/A  . Years of education: N/A   Social History Main Topics  . Smoking status: Never Smoker  . Smokeless tobacco: Never Used  . Alcohol use No  . Drug use: No  . Sexual activity: Not Currently    Birth control/ protection: None     Comment: IUD removed 2 wks ago.   Other Topics  Concern  . Not on file   Social History Narrative  . No narrative on file   Past Surgical History:  Procedure Laterality Date  . ABDOMINAL HYSTERECTOMY  06/29/11   Robotic Assisted Hysterectomy  . CERVICAL SPINE SURGERY  [place]  . LUMBAR LAMINECTOMY  08/31/2011   Procedure: MICRODISCECTOMY LUMBAR LAMINECTOMY;  Surgeon: Tobi Bastos, MD;  Location[facility];  Service: Orthopedics;  Laterality: N/A;  lumbar five sacral one central microdisectomy  . MYOMECTOMY  13  . NECK SURGERY    . POSTERIOR CERVICAL FUSION/FORAMINOTOMY N/A 02/02/2013   Procedure: POSTERIOR C5-6 SPINAL FUSION;  Surgeon: Melina Schools, MD;  Location: [place];  Service: Orthopedics;  Laterality: N/A;  . POSTERIOR FUSION CERVICAL SPINE  02/02/2013   C 5 C6     [name]  . WISDOM TOOTH EXTRACTION     Past Medical History:  Diagnosis Date  . Anemia    hx  . Arthritis   . Complication of anesthesia    slow to wake up,   very difficult to get iv had picc line last time  . Difficult intravenous access    hands are usually best for blood draws and IV starts  . DVT (deep venous thrombosis) ([place])    ? vs lupus  . Fibroids   . Headache(784.0)   . Neck pain   . Scoliosis  LMP 04/01/2011   Opioid Risk Score:   Fall Risk Score:  `1  Depression screen PHQ 2/9  Depression screen [facility] 2/9 12/03/2015 06/11/2015 08/27/2014 06/27/2014 05/22/2014  Decreased Interest 0 0 0 3 0  Down, Depressed, Hopeless 2 2 0 1 0  PHQ - 2 Score 2 2 0 4 0  Altered sleeping - 3 - 3 -  Tired, decreased energy - 3 - 3 -  Change in appetite - 3 - 3 -  Feeling bad or failure about yourself  - 2 - 1 -  Trouble concentrating - 2 - 0 -  Moving slowly or fidgety/restless - 2 - 0 -  Suicidal thoughts - 0 - 0 -  PHQ-9 Score - 17 - 14 -  Difficult doing work/chores - Very difficult - - -    Review of Systems  Constitutional: Negative.   HENT: Negative.   Eyes: Negative.   Respiratory: Negative.   Cardiovascular: Negative.     Gastrointestinal: Positive for nausea and vomiting.  Endocrine: Negative.   Genitourinary: Negative.   Musculoskeletal: Negative.   Skin: Negative.   Allergic/Immunologic: Negative.   Neurological: Negative.   Hematological: Negative.   Psychiatric/Behavioral: Negative.   All other systems reviewed and are negative.      Objective:   Physical Exam  Constitutional: She is oriented to person, place, and time. She appears well-developed and well-nourished.  HENT:  Head: Normocephalic and atraumatic.  Neck: Normal range of motion. Neck supple.  Cardiovascular: Normal rate and regular rhythm.   Pulmonary/Chest: Effort normal and breath sounds normal.  Musculoskeletal:  Normal Muscle Bulk and Muscle Testing Reveals:  Upper Extremities: Full ROM and Muscle Strength 5/5 Lumbar Hypersensitivity Lower Extremities: Full ROM and Muscle Strength 5/5 Bilateral Lower Extremities Flexion Produces Pain into Lumbar Arises from Table slowlyy using straight cane for support   Neurological: She is alert and oriented to person, place, and time.  Skin: Skin is warm and dry.  Psychiatric: She has a normal mood and affect.  Nursing note and vitals reviewed.         Assessment & Plan:  1. 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 Status post decompressive laminectomy: Continue to Monitor 05/11/2016 Refilled:Oxycodone 10mg /325 mg 1 tablet five times a day# 150. RX: MS Contin 30 mg every 12 hours #60 We will continue the opioid monitoring program,this consists of regular clinic visits, examinations, urine drug screen,pill counts as well as use of [place] Controlled Substance Reporting System. 2. Insomnia: On elavil 100 mg hs. 05/11/2016 3. Lumbar Radiculopathy/Neuropathic pain: Continue Gabapentin to 300mg  TID. 05/11/2016 4. Muscle Spasms. Continue: Tizanidine. 05/11/2016 5. Neurogenic bowel and bladder- Continue to monitor. 05/11/2016 6.  Constipation/Opioid Therapy: Continue Linzess. 05/11/2016 7. Depression: Continue Celexa. 05/11/2016 8. Bilateral Greater Trochanteric Bursitis:  Continue with Heat/Ice Therapy.   30 minutes of face to face patient care time was spent during this visit. All questions were encouraged and answered.

## 2016-05-14 MED FILL — OXYCODONE-APAP 10-325: 10-325 | 30 days supply | Qty: 150 | Fill #0

## 2016-06-08 ENCOUNTER — Encounter: Payer: Self-pay | Attending: Physical Medicine and Rehabilitation | Admitting: Registered Nurse

## 2016-06-08 ENCOUNTER — Encounter: Payer: Self-pay | Admitting: Registered Nurse

## 2016-06-08 VITALS — BP 121/90 | HR 96

## 2016-06-08 DIAGNOSIS — Z5181 Encounter for therapeutic drug level monitoring: Secondary | ICD-10-CM

## 2016-06-08 DIAGNOSIS — M961 Postlaminectomy syndrome, not elsewhere classified: Secondary | ICD-10-CM

## 2016-06-08 DIAGNOSIS — T402X5A Adverse effect of other opioids, initial encounter: Secondary | ICD-10-CM

## 2016-06-08 DIAGNOSIS — M5126 Other intervertebral disc displacement, lumbar region: Secondary | ICD-10-CM | POA: Insufficient documentation

## 2016-06-08 DIAGNOSIS — M48061 Spinal stenosis, lumbar region without neurogenic claudication: Secondary | ICD-10-CM | POA: Insufficient documentation

## 2016-06-08 DIAGNOSIS — Z79899 Other long term (current) drug therapy: Secondary | ICD-10-CM

## 2016-06-08 DIAGNOSIS — M7062 Trochanteric bursitis, left hip: Secondary | ICD-10-CM

## 2016-06-08 DIAGNOSIS — G834 Cauda equina syndrome: Secondary | ICD-10-CM

## 2016-06-08 DIAGNOSIS — G894 Chronic pain syndrome: Secondary | ICD-10-CM

## 2016-06-08 DIAGNOSIS — F3289 Other specified depressive episodes: Secondary | ICD-10-CM

## 2016-06-08 DIAGNOSIS — K5903 Drug induced constipation: Secondary | ICD-10-CM

## 2016-06-08 DIAGNOSIS — M62830 Muscle spasm of back: Secondary | ICD-10-CM

## 2016-06-08 DIAGNOSIS — M5416 Radiculopathy, lumbar region: Secondary | ICD-10-CM

## 2016-06-08 MED ORDER — OXYCODONE HCL 10 MG PO TABS: 10.0000 mg | ORAL_TABLET | Freq: Every day | ORAL | 0 refills | Status: DC | PRN

## 2016-06-08 NOTE — Progress Notes (Signed)
Subjective:    Patient ID: [name], female    DOB: Jul 21, 1970, 46 y.o.   MRN: [id number]  HPI: [name] is a 46 year old female who returns for follow up appointmentfor chronic pain and medication refill. She states her pain is located in her neck, she's wearing soft neck brace. Also states she has been experiencing  excruciating neck pain ( mainly right side) radiating into her right shoulder, she followed up with Dr. Rolena Infante, awaiting a MRI she states. Also has lower back pain radiating into her bilateral hips left greater than rightand bilateral lower extremities laterally. She rates her pain 9.  Her current exercise regime is walking.  Son in room.  [name] wasn't able to pick up the MS contin last month due to cost. She will call the office once she obtains the funds to pay for the MS Contin prescription will be printed, she has permission to pick up the prescription. She verbalizes understanding.  Pain Inventory Average Pain 9 Pain Right Now 9 My pain is sharp, dull, stabbing, tingling and aching  In the last 24 hours, has pain interfered with the following? General activity 9 Relation with others 9 Enjoyment of life 9 What TIME of day is your pain at its worst? all times Sleep (in general) Poor  Pain is worse with: walking, bending, sitting and standing Pain improves with: heat/ice and medication Relief from Meds: 2  Mobility use a cane ability to climb steps?  no do you drive?  no  Function not employed: date last employed . I need assistance with the following:  household duties and shopping  Neuro/Psych spasms  Prior Studies Any changes since last visit?  no  Physicians involved in your care Any changes since last visit?  no   Family History  Problem Relation Age of Onset  . Hypertension Maternal Grandmother   . Anesthesia problems Neg Hx    Social History   Social History  . Marital status: Divorced    Spouse name: N/A  .  Number of children: N/A  . Years of education: N/A   Social History Main Topics  . Smoking status: Never Smoker  . Smokeless tobacco: Never Used  . Alcohol use No  . Drug use: No  . Sexual activity: Not Currently    Birth control/ protection: None     Comment: IUD removed 2 wks ago.   Other Topics Concern  . None   Social History Narrative  . None   Past Surgical History:  Procedure Laterality Date  . ABDOMINAL HYSTERECTOMY  06/29/11   Robotic Assisted Hysterectomy  . CERVICAL SPINE SURGERY  [place]  . LUMBAR LAMINECTOMY  08/31/2011   Procedure: MICRODISCECTOMY LUMBAR LAMINECTOMY;  Surgeon: Tobi Bastos, MD;  [facility];  Service: Orthopedics;  Laterality: N/A;  lumbar five sacral one central microdisectomy  . MYOMECTOMY  13  . NECK SURGERY    . POSTERIOR CERVICAL FUSION/FORAMINOTOMY N/A 02/02/2013   Procedure: POSTERIOR C5-6 SPINAL FUSION;  Surgeon: Melina Schools, MD;  Location: [place];  Service: Orthopedics;  Laterality: N/A;  . POSTERIOR FUSION CERVICAL SPINE  02/02/2013   C 5 C6     [name]  . WISDOM TOOTH EXTRACTION     Past Medical History:  Diagnosis Date  . Anemia    hx  . Arthritis   . Complication of anesthesia    slow to wake up,   very difficult  to get iv had picc line last time  . Difficult intravenous access    hands are usually best for blood draws and IV starts  . DVT (deep venous thrombosis) ([place])    ? vs lupus  . Fibroids   . Headache(784.0)   . Neck pain   . Scoliosis    BP 121/90   Pulse 96   LMP 04/01/2011   SpO2 98%   Opioid Risk Score:   Fall Risk Score:  `1  Depression screen PHQ 2/9  Depression screen [facility] 2/9 12/03/2015 06/11/2015 08/27/2014 06/27/2014 05/22/2014  Decreased Interest 0 0 0 3 0  Down, Depressed, Hopeless 2 2 0 1 0  PHQ - 2 Score 2 2 0 4 0  Altered sleeping - 3 - 3 -  Tired, decreased energy - 3 - 3 -  Change in appetite - 3 - 3 -  Feeling bad or failure about yourself  - 2 - 1 -  Trouble  concentrating - 2 - 0 -  Moving slowly or fidgety/restless - 2 - 0 -  Suicidal thoughts - 0 - 0 -  PHQ-9 Score - 17 - 14 -  Difficult doing work/chores - Very difficult - - -    Review of Systems  Constitutional: Negative.   HENT: Negative.   Eyes: Negative.   Respiratory: Negative.   Cardiovascular: Negative.   Gastrointestinal: Negative.   Endocrine: Negative.   Genitourinary: Negative.   Musculoskeletal: Positive for gait problem and neck pain.  Skin: Negative.   Allergic/Immunologic: Negative.   Hematological: Negative.   Psychiatric/Behavioral: Negative.        Objective:   Physical Exam  Constitutional: She is oriented to person, place, and time. She appears well-developed and well-nourished.  HENT:  Head: Normocephalic and atraumatic.  Neck: Normal range of motion. Neck supple.  Cardiovascular: Normal rate and regular rhythm.   Pulmonary/Chest: Effort normal and breath sounds normal.  Musculoskeletal:  Normal Muscle Bulk and Muscle Testing Reveals: Upper Extremities: Right: Decreased ROM 45 Degrees and Muscle Strength 4/5 Right AC Joint Tenderness Left: Decreased ROM 90 Degrees and Muscle Strength 5/5 Lumbar Paraspinal Tenderness: L-3-L-5 Left Greater Trochanteric Tenderness Lower Extremities: Full ROM and Muscle Strength 5/5 Arises from Table slowly using straight cane for support Antalgic Gait  Neurological: She is alert and oriented to person, place, and time.  Skin: Skin is warm and dry.  Psychiatric: She has a normal mood and affect.  Nursing note and vitals reviewed.         Assessment & Plan:  1. 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 Status post decompressive laminectomy: Continue to Monitor 06/08/2016 Refilled:Oxycodone 10mg /568 mg 1 tablet five times a day# 150. We will continue the opioid monitoring program,this consists of regular clinic visits, examinations, urine drug screen,pill counts as well as use of  [place] Controlled Substance Reporting System. 2. Insomnia: On elavil 100 mg hs. 06/08/2016 3. Lumbar Radiculopathy/Neuropathic pain: Continue Gabapentin to 300mg  TID. 06/08/2016 4. Muscle Spasms. Continue: Tizanidine. 06/08/2016 5. Neurogenic bowel and bladder- Continue to monitor. 06/08/2016 6. Constipation/Opioid Therapy: Continue Linzess. 06/08/2016 7. Depression: Continue Celexa. 06/08/2016 8. Bilateral Greater Trochanteric Bursitis L>R:  Continue with Heat/Ice Therapy.   20 minutes of face to face patient care time was spent during this visit. All questions were encouraged and answered.  F/U in 1 month

## 2016-06-12 ENCOUNTER — Telehealth: Payer: Self-pay | Admitting: Registered Nurse

## 2016-06-12 ENCOUNTER — Other Ambulatory Visit: Payer: Self-pay

## 2016-06-12 LAB — TOXASSURE SELECT,+ANTIDEPR,UR

## 2016-06-12 MED ORDER — OXYCODONE-ACETAMINOPHEN 10-325 MG PO TABS: ORAL_TABLET | ORAL | 0 refills | Status: DC

## 2016-06-12 NOTE — Telephone Encounter (Signed)
Received a call from Opal Sidles at  [facility], stating Ms. Bachmann Oxycodone can be approved if I change the order to Oxycodone 5/325 mg two Tablets five times a day as needed, this will be 3,250 mg of Tylenol a day. This provider will not dispense the above ( High dose of Tylenol), Ms. Gensel called and verbalizes understanding. She will continue the Oxycodone 10/325 mg.  Ms. Eisenbeis UDS was + ETOH, it was her [birth year] on [birth year], she had a glass of wine at [facility]. Educated on the [employer], she verbalizes understanding.

## 2016-06-12 NOTE — Telephone Encounter (Signed)
Ms. Maxfield arrived to office stating her Oxycodone prescription was written wrong. Ms. Emrich Oxycodone 10/325 was changed to Oxycodone 10 mg due to cost. A call was placed to [facility], the price for Oxycodone 10/325 mg $ 455.00 Oxycodone 10 mg was priced at 87.00. [place] [facility] was called again today and the above price was given to Ms. Blakely.  NCCSR was reviewed.  CMA called [facility] pharmacy  Oxycodone 10/325 mg was  $ 118.00 Oxcodone 10 mg was. $89.00 Ms. Markin asked if she could resume her Oxycodone 10/325 mg. Oxycodone 10 mg prescription was discarded.  New prescription printed for Oxycodone 10/325mg , she verbalizes understanding.

## 2016-06-15 ENCOUNTER — Telehealth: Payer: Self-pay | Admitting: Registered Nurse

## 2016-06-15 MED ORDER — OXYCODONE-ACETAMINOPHEN 10-325 MG PO TABS: ORAL_TABLET | ORAL | 0 refills | Status: DC

## 2016-06-15 NOTE — Telephone Encounter (Signed)
Ms. Billick return the Oxycodone prescription, the supervising provider was not on the prescription and [facility] would not fill. New prescriptiion was printed called and [facility] was called.

## 2016-07-02 ENCOUNTER — Telehealth: Payer: Self-pay | Admitting: Registered Nurse

## 2016-07-02 NOTE — Telephone Encounter (Signed)
[name] had a UDS performed on 06/08/2016 , it was inconsistent, due to [place]. It was her birthday and she admits having a glass wine with lunch. Reviewed the narcotic Policy, she verbalizes understanding.

## 2016-07-07 ENCOUNTER — Encounter: Payer: Self-pay | Admitting: Registered Nurse

## 2016-07-07 ENCOUNTER — Encounter: Payer: Self-pay | Attending: Physical Medicine and Rehabilitation | Admitting: Registered Nurse

## 2016-07-07 VITALS — BP 133/90 | HR 96

## 2016-07-07 DIAGNOSIS — M7062 Trochanteric bursitis, left hip: Secondary | ICD-10-CM

## 2016-07-07 DIAGNOSIS — M961 Postlaminectomy syndrome, not elsewhere classified: Secondary | ICD-10-CM

## 2016-07-07 DIAGNOSIS — G834 Cauda equina syndrome: Secondary | ICD-10-CM | POA: Insufficient documentation

## 2016-07-07 DIAGNOSIS — Z79899 Other long term (current) drug therapy: Secondary | ICD-10-CM

## 2016-07-07 DIAGNOSIS — M62830 Muscle spasm of back: Secondary | ICD-10-CM

## 2016-07-07 DIAGNOSIS — M5416 Radiculopathy, lumbar region: Secondary | ICD-10-CM

## 2016-07-07 DIAGNOSIS — F411 Generalized anxiety disorder: Secondary | ICD-10-CM

## 2016-07-07 DIAGNOSIS — M5126 Other intervertebral disc displacement, lumbar region: Secondary | ICD-10-CM | POA: Insufficient documentation

## 2016-07-07 DIAGNOSIS — Z5181 Encounter for therapeutic drug level monitoring: Secondary | ICD-10-CM

## 2016-07-07 DIAGNOSIS — M48061 Spinal stenosis, lumbar region without neurogenic claudication: Secondary | ICD-10-CM | POA: Insufficient documentation

## 2016-07-07 DIAGNOSIS — G894 Chronic pain syndrome: Secondary | ICD-10-CM

## 2016-07-07 MED ORDER — OXYCODONE-ACETAMINOPHEN 10-325 MG PO TABS: ORAL_TABLET | ORAL | 0 refills | Status: DC

## 2016-07-07 MED ORDER — CITALOPRAM HYDROBROMIDE 10 MG PO TABS: 10.0000 mg | ORAL_TABLET | Freq: Every day | ORAL | 3 refills | Status: DC

## 2016-07-07 MED ORDER — TRAMADOL HCL 50 MG PO TABS: 50.0000 mg | ORAL_TABLET | Freq: Three times a day (TID) | ORAL | 0 refills | Status: DC | PRN

## 2016-07-07 MED ORDER — AMITRIPTYLINE HCL 100 MG PO TABS: 100.0000 mg | ORAL_TABLET | Freq: Every day | ORAL | 3 refills | Status: DC

## 2016-07-07 MED ORDER — GABAPENTIN 300 MG PO CAPS: 300.0000 mg | ORAL_CAPSULE | Freq: Three times a day (TID) | ORAL | 3 refills | Status: DC

## 2016-07-07 MED ORDER — PROMETHAZINE HCL 12.5 MG PO TABS: 12.5000 mg | ORAL_TABLET | Freq: Four times a day (QID) | ORAL | 3 refills | Status: DC | PRN

## 2016-07-07 MED ORDER — ALPRAZOLAM 0.25 MG PO TABS: 0.2500 mg | ORAL_TABLET | Freq: Every evening | ORAL | 0 refills | Status: DC | PRN

## 2016-07-07 MED ORDER — TIZANIDINE HCL 4 MG PO TABS: ORAL_TABLET | ORAL | 3 refills | Status: DC

## 2016-07-07 MED FILL — PROMETHAZINE 12.5 MG TABLET: 12.5 | 7 days supply | Qty: 30 | Fill #0

## 2016-07-07 MED FILL — CITALOPRAM HBR 10 MG TABLET: 10 | 30 days supply | Qty: 30 | Fill #0

## 2016-07-07 MED FILL — tiZANidine HCL 4 MG TABS: 4 | 30 days supply | Qty: 180 | Fill #0

## 2016-07-07 MED FILL — AMITRIPTYLINE HCL 100 MG TA: 100 | 30 days supply | Qty: 30 | Fill #0

## 2016-07-07 MED FILL — GABAPENTIN 300 MG CAPSULE: 300 | 30 days supply | Qty: 90 | Fill #0
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
## 2016-08-05 ENCOUNTER — Telehealth: Payer: Self-pay | Admitting: *Deleted

## 2016-08-05 NOTE — Telephone Encounter (Signed)
RX printed on 06/08/16 for Oxycodone 10 mg # 150 was never picked up.  It has been destroyed.

## 2016-08-10 ENCOUNTER — Encounter: Payer: Self-pay | Admitting: Physical Medicine & Rehabilitation

## 2016-08-10 ENCOUNTER — Encounter: Payer: Self-pay | Attending: Physical Medicine and Rehabilitation | Admitting: Physical Medicine & Rehabilitation

## 2016-08-10 VITALS — BP 128/84 | HR 84 | Resp 16

## 2016-08-10 DIAGNOSIS — M7062 Trochanteric bursitis, left hip: Secondary | ICD-10-CM

## 2016-08-10 DIAGNOSIS — Z5181 Encounter for therapeutic drug level monitoring: Secondary | ICD-10-CM

## 2016-08-10 DIAGNOSIS — M5126 Other intervertebral disc displacement, lumbar region: Secondary | ICD-10-CM

## 2016-08-10 DIAGNOSIS — G834 Cauda equina syndrome: Secondary | ICD-10-CM | POA: Insufficient documentation

## 2016-08-10 DIAGNOSIS — M48061 Spinal stenosis, lumbar region without neurogenic claudication: Secondary | ICD-10-CM | POA: Insufficient documentation

## 2016-08-10 DIAGNOSIS — F5101 Primary insomnia: Secondary | ICD-10-CM

## 2016-08-10 DIAGNOSIS — Z79899 Other long term (current) drug therapy: Secondary | ICD-10-CM

## 2016-08-10 DIAGNOSIS — M7061 Trochanteric bursitis, right hip: Secondary | ICD-10-CM

## 2016-08-10 MED ORDER — OXYCODONE-ACETAMINOPHEN 10-325 MG PO TABS: ORAL_TABLET | ORAL | 0 refills | Status: DC

## 2016-08-10 MED ORDER — DICLOFENAC SODIUM 75 MG PO TBEC: 75.0000 mg | DELAYED_RELEASE_TABLET | Freq: Two times a day (BID) | ORAL | 3 refills | Status: DC

## 2016-08-10 MED ORDER — ALPRAZOLAM 0.25 MG PO TABS: 0.2500 mg | ORAL_TABLET | Freq: Every day | ORAL | 2 refills | Status: DC

## 2016-08-10 MED FILL — ?DICLOFENAC SOD DR 75 MG TA: 75 | 30 days supply | Qty: 60 | Fill #0

## 2016-08-10 NOTE — Patient Instructions (Signed)
PLEASE FEEL FREE TO CALL OUR OFFICE WITH ANY PROBLEMS OR QUESTIONS ([phone])      
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
## 2016-09-14 ENCOUNTER — Encounter: Payer: Self-pay | Attending: Physical Medicine and Rehabilitation | Admitting: Registered Nurse

## 2016-09-14 ENCOUNTER — Encounter: Payer: Self-pay | Admitting: Registered Nurse

## 2016-09-14 VITALS — BP 125/77 | HR 87

## 2016-09-14 DIAGNOSIS — F411 Generalized anxiety disorder: Secondary | ICD-10-CM

## 2016-09-14 DIAGNOSIS — Z79899 Other long term (current) drug therapy: Secondary | ICD-10-CM

## 2016-09-14 DIAGNOSIS — Z5181 Encounter for therapeutic drug level monitoring: Secondary | ICD-10-CM

## 2016-09-14 DIAGNOSIS — F3289 Other specified depressive episodes: Secondary | ICD-10-CM

## 2016-09-14 DIAGNOSIS — K5903 Drug induced constipation: Secondary | ICD-10-CM

## 2016-09-14 DIAGNOSIS — M62830 Muscle spasm of back: Secondary | ICD-10-CM

## 2016-09-14 DIAGNOSIS — M48061 Spinal stenosis, lumbar region without neurogenic claudication: Secondary | ICD-10-CM | POA: Insufficient documentation

## 2016-09-14 DIAGNOSIS — F5101 Primary insomnia: Secondary | ICD-10-CM

## 2016-09-14 DIAGNOSIS — M5126 Other intervertebral disc displacement, lumbar region: Secondary | ICD-10-CM | POA: Insufficient documentation

## 2016-09-14 DIAGNOSIS — M7062 Trochanteric bursitis, left hip: Secondary | ICD-10-CM

## 2016-09-14 DIAGNOSIS — T402X5A Adverse effect of other opioids, initial encounter: Secondary | ICD-10-CM

## 2016-09-14 DIAGNOSIS — G834 Cauda equina syndrome: Secondary | ICD-10-CM | POA: Insufficient documentation

## 2016-09-14 DIAGNOSIS — M961 Postlaminectomy syndrome, not elsewhere classified: Secondary | ICD-10-CM

## 2016-09-14 DIAGNOSIS — M7061 Trochanteric bursitis, right hip: Secondary | ICD-10-CM

## 2016-09-14 MED ORDER — OXYCODONE-ACETAMINOPHEN 10-325 MG PO TABS: ORAL_TABLET | ORAL | 0 refills | Status: DC

## 2016-09-14 NOTE — Progress Notes (Signed)
Subjective:    Patient ID: [name], female    DOB: September 02, 1970, 46 y.o.   MRN: [id number]  HPI: [name] is a 46 year old female who returns for follow up appointmentfor chronic pain and medication refill. She states her pain is located in her lower back, bilateral hips and bilateral knees. She rates her pain 8. Her current exercise regime is attending the [place] three days a week and walking.  Last UDS was performed on 06/08/2016 it was inconsistent +ETOH.   Pain Inventory Average Pain 8 Pain Right Now 8 My pain is sharp, stabbing, tingling and aching  In the last 24 hours, has pain interfered with the following? General activity 8 Relation with others 8 Enjoyment of life 8 What TIME of day is your pain at its worst? all Sleep (in general) Poor  Pain is worse with: walking, bending, sitting, standing and some activites Pain improves with: rest, heat/ice, therapy/exercise and medication Relief from Meds: 9  Mobility walk without assistance use a cane ability to climb steps?  no do you drive?  no  Function disabled: date disabled . I need assistance with the following:  household duties and shopping  Neuro/Psych No problems in this area  Prior Studies Any changes since last visit?  no  Physicians involved in your care Any changes since last visit?  no   Family History  Problem Relation Age of Onset  . Hypertension Maternal Grandmother   . Anesthesia problems Neg Hx    Social History   Social History  . Marital status: Divorced    Spouse name: N/A  . Number of children: N/A  . Years of education: N/A   Social History Main Topics  . Smoking status: Never Smoker  . Smokeless tobacco: Never Used  . Alcohol use No  . Drug use: No  . Sexual activity: Not Currently    Birth control/ protection: None     Comment: IUD removed 2 wks ago.   Other Topics Concern  . Not on file   Social History Narrative  . No narrative on file    Past Surgical History:  Procedure Laterality Date  . ABDOMINAL HYSTERECTOMY  06/29/11   Robotic Assisted Hysterectomy  . CERVICAL SPINE SURGERY  [place]  . LUMBAR LAMINECTOMY  08/31/2011   Procedure: MICRODISCECTOMY LUMBAR LAMINECTOMY;  Surgeon: Tobi Bastos, MD;  [facility];  Service: Orthopedics;  Laterality: N/A;  lumbar five sacral one central microdisectomy  . MYOMECTOMY  13  . NECK SURGERY    . POSTERIOR CERVICAL FUSION/FORAMINOTOMY N/A 02/02/2013   Procedure: POSTERIOR C5-6 SPINAL FUSION;  Surgeon: Melina Schools, MD;  Location: [facility];  Service: Orthopedics;  Laterality: N/A;  . POSTERIOR FUSION CERVICAL SPINE  02/02/2013   C 5 C6     [name]  . WISDOM TOOTH EXTRACTION     Past Medical History:  Diagnosis Date  . Anemia    hx  . Arthritis   . Complication of anesthesia    slow to wake up,   very difficult to get iv had picc line last time  . Difficult intravenous access    hands are usually best for blood draws and IV starts  . DVT (deep venous thrombosis) ([place])    ? vs lupus  . Fibroids   . Headache(784.0)   . Neck pain   . Scoliosis    LMP 04/01/2011   Opioid Risk Score:  Fall Risk Score:  `1  Depression screen PHQ 2/9  Depression screen [facility] 2/9 07/07/2016 12/03/2015 06/11/2015 08/27/2014 06/27/2014 05/22/2014  Decreased Interest 0 0 0 0 3 0  Down, Depressed, Hopeless 2 2 2  0 1 0  PHQ - 2 Score 2 2 2  0 4 0  Altered sleeping - - 3 - 3 -  Tired, decreased energy - - 3 - 3 -  Change in appetite - - 3 - 3 -  Feeling bad or failure about yourself  - - 2 - 1 -  Trouble concentrating - - 2 - 0 -  Moving slowly or fidgety/restless - - 2 - 0 -  Suicidal thoughts - - 0 - 0 -  PHQ-9 Score - - 17 - 14 -  Difficult doing work/chores - - Very difficult - - -     Review of Systems  Constitutional: Negative.   HENT: Negative.   Eyes: Negative.   Respiratory: Negative.   Cardiovascular: Negative.   Gastrointestinal: Negative.   Endocrine:  Negative.   Genitourinary: Negative.   Musculoskeletal: Negative.   Skin: Negative.   Allergic/Immunologic: Negative.   Neurological: Negative.   Hematological: Negative.   Psychiatric/Behavioral: Negative.   All other systems reviewed and are negative.      Objective:   Physical Exam  Constitutional: She is oriented to person, place, and time. She appears well-developed and well-nourished.  HENT:  Head: Normocephalic and atraumatic.  Neck: Normal range of motion. Neck supple.  Cardiovascular: Normal rate and regular rhythm.   Pulmonary/Chest: Effort normal and breath sounds normal.  Musculoskeletal:  Normal Muscle Bulk and Muscle Testing Reveals: Upper Extremities: Full ROM and Muscle Strength 5/5 Lumbar Paraspinal Tenderness: L-3-L-5 Bilateral Greater Trochanter Tenderness Lower Extremities: Full ROM and Muscle Strength 5/5 Bilateral Lower Extremities Flexion Produces Pain into Bilateral Patella's Arises from Table Slowly, using straight cane for support Antalgic Gait  Neurological: She is alert and oriented to person, place, and time.  Skin: Skin is warm and dry.  Psychiatric: She has a normal mood and affect.  Nursing note and vitals reviewed.         Assessment & Plan:  1. 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 Status post decompressive laminectomy: Continue to Monitor 09/14/2016 Refilled:Oxycodone 10mg /325 mg 1 tablet five times a day# 140. We will continue the opioid monitoring program,this consists of regular clinic visits, examinations, urine drug screen,pill counts as well as use of [place] Controlled Substance Reporting System. 2. Insomnia: Continue Xanax 09/14/2016 3. Lumbar Radiculopathy/Neuropathic pain: Continue Gabapentin to 300mg  TID. 09/14/2016 4. Muscle Spasms. Continue: Tizanidine. 09/14/2016 5. Neurogenic bowel and bladder- Continue to monitor. 09/14/2016 6. Constipation/Opioid Therapy: Continue  Linzess. 09/14/2016 7. Depression: Continue Celexa. 09/14/2016 8. Left Greater Trochanteric Bursitis L>R: Continue  Heat/Ice Therapy. 09/14/2016 9. Chronic Pain Syndrome: Continue Voltaren  20 minutes of face to face patient care time was spent during this visit. All questions were encouraged and answered.   F/U in 1 month

## 2016-09-28 ENCOUNTER — Other Ambulatory Visit: Payer: Self-pay | Admitting: *Deleted

## 2016-09-28 MED ORDER — LINACLOTIDE 290 MCG PO CAPS: 290.0000 ug | ORAL_CAPSULE | Freq: Every day | ORAL | 3 refills | Status: DC

## 2016-09-28 NOTE — Telephone Encounter (Signed)
PRINTED FOR PASS PROGRAM 

## 2016-10-12 ENCOUNTER — Encounter: Payer: Self-pay | Admitting: Registered Nurse

## 2016-10-12 ENCOUNTER — Encounter: Payer: Self-pay | Attending: Physical Medicine and Rehabilitation | Admitting: Registered Nurse

## 2016-10-12 ENCOUNTER — Telehealth: Payer: Self-pay | Admitting: Registered Nurse

## 2016-10-12 VITALS — BP 137/81 | HR 74 | Temp 98.5°F

## 2016-10-12 DIAGNOSIS — G894 Chronic pain syndrome: Secondary | ICD-10-CM

## 2016-10-12 DIAGNOSIS — Z79899 Other long term (current) drug therapy: Secondary | ICD-10-CM

## 2016-10-12 DIAGNOSIS — F5101 Primary insomnia: Secondary | ICD-10-CM

## 2016-10-12 DIAGNOSIS — M48061 Spinal stenosis, lumbar region without neurogenic claudication: Secondary | ICD-10-CM

## 2016-10-12 DIAGNOSIS — M7062 Trochanteric bursitis, left hip: Secondary | ICD-10-CM

## 2016-10-12 DIAGNOSIS — G834 Cauda equina syndrome: Secondary | ICD-10-CM

## 2016-10-12 DIAGNOSIS — Z5181 Encounter for therapeutic drug level monitoring: Secondary | ICD-10-CM

## 2016-10-12 DIAGNOSIS — M25561 Pain in right knee: Secondary | ICD-10-CM

## 2016-10-12 DIAGNOSIS — M961 Postlaminectomy syndrome, not elsewhere classified: Secondary | ICD-10-CM

## 2016-10-12 DIAGNOSIS — F3289 Other specified depressive episodes: Secondary | ICD-10-CM

## 2016-10-12 DIAGNOSIS — T402X5A Adverse effect of other opioids, initial encounter: Secondary | ICD-10-CM

## 2016-10-12 DIAGNOSIS — G8929 Other chronic pain: Secondary | ICD-10-CM

## 2016-10-12 DIAGNOSIS — M7061 Trochanteric bursitis, right hip: Secondary | ICD-10-CM

## 2016-10-12 DIAGNOSIS — K5903 Drug induced constipation: Secondary | ICD-10-CM

## 2016-10-12 DIAGNOSIS — M5126 Other intervertebral disc displacement, lumbar region: Secondary | ICD-10-CM

## 2016-10-12 MED ORDER — PROMETHAZINE HCL 12.5 MG PO TABS: 12.5000 mg | ORAL_TABLET | Freq: Four times a day (QID) | ORAL | 3 refills | Status: DC | PRN

## 2016-10-12 MED ORDER — TIZANIDINE HCL 4 MG PO TABS: ORAL_TABLET | ORAL | 3 refills | Status: DC

## 2016-10-12 MED ORDER — GABAPENTIN 300 MG PO CAPS: 300.0000 mg | ORAL_CAPSULE | Freq: Three times a day (TID) | ORAL | 3 refills | Status: DC

## 2016-10-12 MED ORDER — ALPRAZOLAM 0.25 MG PO TABS: 0.2500 mg | ORAL_TABLET | Freq: Every day | ORAL | 2 refills | Status: DC

## 2016-10-12 MED ORDER — OXYCODONE-ACETAMINOPHEN 10-325 MG PO TABS: ORAL_TABLET | ORAL | 0 refills | Status: DC

## 2016-10-12 MED ORDER — CITALOPRAM HYDROBROMIDE 10 MG PO TABS: 10.0000 mg | ORAL_TABLET | Freq: Every day | ORAL | 3 refills | Status: DC

## 2016-10-12 NOTE — Progress Notes (Signed)
Subjective:    Patient ID: [name], female    DOB: [birth year], 46 y.o.   MRN: [id number]  HPI: [name] is a 46year old female who returns for follow up appointmentfor chronic pain and medication refill. She states her pain is located in her lower back, bilateral hips and right knee pain. She rates her pain 9. Her current exercise regime is attending the [place] three days a week and walking.  [name] states when she came out of the shower this morning she experienced chest pain with SOB she leaned over on her bed and pain resolved.  She didn't seek medical attention. At this time she denies SOB or chest pain, refuses ED evaluation.  Instructed if this occurs again to seek medical attention immediately, she verbalizes understanding.   Last UDS was performed on 06/08/2016 it was inconsistent +ETOH.   Son in room.   Pain Inventory Average Pain 9 Pain Right Now 9 My pain is stabbing, tingling and aching  In the last 24 hours, has pain interfered with the following? General activity 9 Relation with others 9 Enjoyment of life 9 What TIME of day is your pain at its worst? morning, daytime,evening,night Sleep (in general) Poor  Pain is worse with: walking, bending, sitting and standing Pain improves with: medication Relief from Meds: 7  Mobility use a cane ability to climb steps?  no do you drive?  no  Function not employed: date last employed . I need assistance with the following:  household duties and shopping  Neuro/Psych No problems in this area  Prior Studies .  Physicians involved in your care .   Family History  Problem Relation Age of Onset  . Hypertension Maternal Grandmother   . Anesthesia problems Neg Hx    Social History   Social History  . Marital status: Divorced    Spouse name: N/A  . Number of children: N/A  . Years of education: N/A   Social History Main Topics  . Smoking status: Never Smoker  . Smokeless  tobacco: Never Used  . Alcohol use No  . Drug use: No  . Sexual activity: Not Currently    Birth control/ protection: None     Comment: IUD removed 2 wks ago.   Other Topics Concern  . None   Social History Narrative  . None   Past Surgical History:  Procedure Laterality Date  . ABDOMINAL HYSTERECTOMY  06/29/11   Robotic Assisted Hysterectomy  . CERVICAL SPINE SURGERY  [place]  . LUMBAR LAMINECTOMY  08/31/2011   Procedure: MICRODISCECTOMY LUMBAR LAMINECTOMY;  Surgeon: Tobi Bastos, MD;  Location: [facility];  Service: Orthopedics;  Laterality: N/A;  lumbar five sacral one central microdisectomy  . MYOMECTOMY  13  . NECK SURGERY    . POSTERIOR CERVICAL FUSION/FORAMINOTOMY N/A 02/02/2013   Procedure: POSTERIOR C5-6 SPINAL FUSION;  Surgeon: Melina Schools, MD;  Location: [place];  Service: Orthopedics;  Laterality: N/A;  . POSTERIOR FUSION CERVICAL SPINE  02/02/2013   C 5 C6     [name]  . WISDOM TOOTH EXTRACTION     Past Medical History:  Diagnosis Date  . Anemia    hx  . Arthritis   . Complication of anesthesia    slow to wake up,   very difficult to get iv had picc line last time  . Difficult intravenous access    hands are usually best for blood draws and  IV starts  . DVT (deep venous thrombosis) ([place])    ? vs lupus  . Fibroids   . Headache(784.0)   . Neck pain   . Scoliosis    BP 137/81   Pulse 74   Temp 98.5 F (36.9 C)   LMP 04/01/2011   SpO2 97%   Opioid Risk Score:   Fall Risk Score:  `1  Depression screen PHQ 2/9  Depression screen [facility] 2/9 07/07/2016 12/03/2015 06/11/2015 08/27/2014 06/27/2014 05/22/2014  Decreased Interest 0 0 0 0 3 0  Down, Depressed, Hopeless 2 2 2  0 1 0  PHQ - 2 Score 2 2 2  0 4 0  Altered sleeping - - 3 - 3 -  Tired, decreased energy - - 3 - 3 -  Change in appetite - - 3 - 3 -  Feeling bad or failure about yourself  - - 2 - 1 -  Trouble concentrating - - 2 - 0 -  Moving slowly or fidgety/restless - - 2 - 0 -    Suicidal thoughts - - 0 - 0 -  PHQ-9 Score - - 17 - 14 -  Difficult doing work/chores - - Very difficult - - -    Review of Systems  Constitutional: Negative.   HENT: Negative.   Eyes: Negative.   Respiratory: Negative.   Cardiovascular: Negative.   Gastrointestinal: Negative.   Endocrine: Negative.   Genitourinary: Negative.   Musculoskeletal: Negative.   Skin: Negative.   Allergic/Immunologic: Negative.   Neurological: Negative.   Hematological: Negative.   Psychiatric/Behavioral: Negative.   All other systems reviewed and are negative.      Objective:   Physical Exam  Constitutional: She is oriented to person, place, and time. She appears well-developed and well-nourished.  HENT:  Head: Normocephalic and atraumatic.  Neck: Normal range of motion. Neck supple.  Cardiovascular: Normal rate and regular rhythm.   Pulmonary/Chest: Effort normal and breath sounds normal.  Musculoskeletal:  Normal Muscle Bulk and Muscle Testing Reveals: Upper Extremities: Full ROM and Muscle Strength 5/5  Lumbar Paraspinal Tenderness: L-3-L-5 Bilateral Greater Trochanter Tenderness Lower Extremities: Full ROM and Muscle Strength 5/5 Arises from Table Slowly using cane for support Antalgic Gait  Neurological: She is alert and oriented to person, place, and time.  Skin: Skin is warm and dry.  Psychiatric: She has a normal mood and affect.  Nursing note and vitals reviewed.         Assessment & Plan:  1. 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 Status post decompressive laminectomy: Continue to Monitor 10/12/2016 Refilled:Oxycodone 10mg /325 mg 1 tablet five times a day# 140. We will continue the opioid monitoring program,this consists of regular clinic visits, examinations, urine drug screen,pill counts as well as use of [place] Controlled Substance Reporting System. 2. Insomnia: Continue Xanax 10/12/2016 3. Lumbar Radiculopathy/Neuropathic  pain: Continue Gabapentin to 300mg  TID. 10/12/2016 4. Muscle Spasms. Continue: Tizanidine. 10/12/2016 5. Neurogenic bowel and bladder- Continue to monitor. 10/12/2016 6. Constipation/Opioid Therapy: Continue Linzess. 10/12/2016 7. Depression: Continue Celexa. 10/12/2016 8. BilateralGreater Trochanteric Bursitis L>R: Continue  Heat/Ice Therapy. 10/12/2016 9. Chronic Pain Syndrome: Continue Voltaren  20 minutes of face to face patient care time was spent during this visit. All questions were encouraged and answered.   F/U in 1 month

## 2016-10-12 NOTE — Telephone Encounter (Signed)
On 10/12/2016 the  [place] was reviewed no conflict was seen on the [place] with multiple prescribers. [name] has a signed narcotic contract with our office. If there were any discrepancies this would have been reported to her physician.

## 2016-10-14 ENCOUNTER — Other Ambulatory Visit: Payer: Self-pay | Admitting: Physical Medicine & Rehabilitation

## 2016-10-14 DIAGNOSIS — F5101 Primary insomnia: Secondary | ICD-10-CM

## 2016-11-09 ENCOUNTER — Encounter: Payer: Self-pay | Admitting: Registered Nurse

## 2016-11-10 ENCOUNTER — Encounter: Payer: Self-pay | Admitting: Registered Nurse

## 2016-11-10 ENCOUNTER — Encounter: Payer: Self-pay | Attending: Physical Medicine and Rehabilitation | Admitting: Registered Nurse

## 2016-11-10 VITALS — BP 118/82 | HR 90

## 2016-11-10 DIAGNOSIS — G834 Cauda equina syndrome: Secondary | ICD-10-CM | POA: Insufficient documentation

## 2016-11-10 DIAGNOSIS — Z5181 Encounter for therapeutic drug level monitoring: Secondary | ICD-10-CM

## 2016-11-10 DIAGNOSIS — G894 Chronic pain syndrome: Secondary | ICD-10-CM

## 2016-11-10 DIAGNOSIS — M7061 Trochanteric bursitis, right hip: Secondary | ICD-10-CM

## 2016-11-10 DIAGNOSIS — Z79899 Other long term (current) drug therapy: Secondary | ICD-10-CM

## 2016-11-10 DIAGNOSIS — K5903 Drug induced constipation: Secondary | ICD-10-CM

## 2016-11-10 DIAGNOSIS — T402X5A Adverse effect of other opioids, initial encounter: Secondary | ICD-10-CM

## 2016-11-10 DIAGNOSIS — M961 Postlaminectomy syndrome, not elsewhere classified: Secondary | ICD-10-CM

## 2016-11-10 DIAGNOSIS — M5126 Other intervertebral disc displacement, lumbar region: Secondary | ICD-10-CM | POA: Insufficient documentation

## 2016-11-10 DIAGNOSIS — M7062 Trochanteric bursitis, left hip: Secondary | ICD-10-CM

## 2016-11-10 DIAGNOSIS — M48061 Spinal stenosis, lumbar region without neurogenic claudication: Secondary | ICD-10-CM | POA: Insufficient documentation

## 2016-11-10 DIAGNOSIS — R109 Unspecified abdominal pain: Secondary | ICD-10-CM

## 2016-11-10 DIAGNOSIS — F3289 Other specified depressive episodes: Secondary | ICD-10-CM

## 2016-11-10 MED ORDER — OXYCODONE-ACETAMINOPHEN 10-325 MG PO TABS: ORAL_TABLET | ORAL | 0 refills | Status: DC

## 2016-11-10 NOTE — Progress Notes (Signed)
Subjective:    Patient ID: [name], female    DOB: 1970/09/20, 46 y.o.   MRN: [id number]  HPI:  [name] is a 46year old female who returns for follow up appointmentfor chronic pain and medication refill. She states her pain is located in her lower back and right knee pain. Also states she's having abdominal pain and hasn't had a bowel movement since last weak she reports. abdominal tenderness with palpation and bowel sounds decreased. She has been compliant with her Linzess,  and has used Miralax, Senna and stool softener with no results. Abdominal X-ray ordered, she verbalizes understanding, also states she has an appointment with her PCP next week. She rates her pain 9. Her current exercise regime is attending the [place] three days a week and walking.    Last UDS was performed on 06/08/2016 it was inconsistent +ETOH.   Pain Inventory Average Pain 9 Pain Right Now 9 My pain is constant, sharp, dull, stabbing, tingling and aching  In the last 24 hours, has pain interfered with the following? General activity 8 Relation with others 9 Enjoyment of life 9 What TIME of day is your pain at its worst? all Sleep (in general) all  Pain is worse with: walking, bending, sitting and standing Pain improves with: medication Relief from Meds: 5  Mobility use a cane do you drive?  no  Function not employed: date last employed . I need assistance with the following:  meal prep, household duties and shopping  Neuro/Psych No problems in this area  Prior Studies Any changes since last visit?  no  Physicians involved in your care Any changes since last visit?  no   Family History  Problem Relation Age of Onset  . Hypertension Maternal Grandmother   . Anesthesia problems Neg Hx    Social History   Social History  . Marital status: Divorced    Spouse name: N/A  . Number of children: N/A  . Years of education: N/A   Social History Main Topics  .  Smoking status: Never Smoker  . Smokeless tobacco: Never Used  . Alcohol use No  . Drug use: No  . Sexual activity: Not Currently    Birth control/ protection: None     Comment: IUD removed 2 wks ago.   Other Topics Concern  . Not on file   Social History Narrative  . No narrative on file   Past Surgical History:  Procedure Laterality Date  . ABDOMINAL HYSTERECTOMY  06/29/11   Robotic Assisted Hysterectomy  . CERVICAL SPINE SURGERY  [facility]  . LUMBAR LAMINECTOMY  08/31/2011   Procedure: MICRODISCECTOMY LUMBAR LAMINECTOMY;  Surgeon: Tobi Bastos, MD;  [facility];  Service: Orthopedics;  Laterality: N/A;  lumbar five sacral one central microdisectomy  . MYOMECTOMY  13  . NECK SURGERY    . POSTERIOR CERVICAL FUSION/FORAMINOTOMY N/A 02/02/2013   Procedure: POSTERIOR C5-6 SPINAL FUSION;  Surgeon: Melina Schools, MD;  Location: [place];  Service: Orthopedics;  Laterality: N/A;  . POSTERIOR FUSION CERVICAL SPINE  02/02/2013   C 5 C6     [name]  . WISDOM TOOTH EXTRACTION     Past Medical History:  Diagnosis Date  . Anemia    hx  . Arthritis   . Complication of anesthesia    slow to wake up,   very difficult to get iv had picc line last time  . Difficult intravenous access  hands are usually best for blood draws and IV starts  . DVT (deep venous thrombosis) ([place])    ? vs lupus  . Fibroids   . Headache(784.0)   . Neck pain   . Scoliosis    LMP 04/01/2011   Opioid Risk Score:   Fall Risk Score:  `1  Depression screen PHQ 2/9  Depression screen [facility] 2/9 07/07/2016 12/03/2015 06/11/2015 08/27/2014 06/27/2014 05/22/2014  Decreased Interest 0 0 0 0 3 0  Down, Depressed, Hopeless 2 2 2  0 1 0  PHQ - 2 Score 2 2 2  0 4 0  Altered sleeping - - 3 - 3 -  Tired, decreased energy - - 3 - 3 -  Change in appetite - - 3 - 3 -  Feeling bad or failure about yourself  - - 2 - 1 -  Trouble concentrating - - 2 - 0 -  Moving slowly or fidgety/restless - - 2 - 0 -    Suicidal thoughts - - 0 - 0 -  PHQ-9 Score - - 17 - 14 -  Difficult doing work/chores - - Very difficult - - -     Review of Systems  Constitutional: Positive for diaphoresis and unexpected weight change.  HENT: Negative.   Eyes: Negative.   Respiratory: Positive for cough and shortness of breath.   Cardiovascular: Negative.   Gastrointestinal: Positive for abdominal pain and nausea.  Endocrine: Negative.   Genitourinary: Negative.   Musculoskeletal: Positive for joint swelling.  Skin: Negative.   Allergic/Immunologic: Negative.   Neurological: Negative.   Hematological: Negative.   Psychiatric/Behavioral: Negative.   All other systems reviewed and are negative.      Objective:   Physical Exam  Constitutional: She is oriented to person, place, and time. She appears well-developed and well-nourished.  HENT:  Head: Normocephalic and atraumatic.  Neck: Normal range of motion. Neck supple.  Cardiovascular: Normal rate and regular rhythm.   Pulmonary/Chest: Effort normal and breath sounds normal.  Musculoskeletal:  Normal Muscle Bulk and Muscle Testing Reveals: Upper Extremities: Full ROM and Muscle Strength 5/5 Thoracic Paraspinal Tenderness: T-7-T-9 Lumbar Paraspinal Tenderness: L-3-L-5 Left Greater Trochanter Tenderness Lower Extremities: Full ROM and Muscle Strength 5/5 Arises from Table Slowly Using Straight Cane for Support Antalgic gait   Neurological: She is alert and oriented to person, place, and time.  Skin: Skin is warm and dry.  Psychiatric: She has a normal mood and affect.  Nursing note and vitals reviewed.         Assessment & Plan:  1. 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 Status post decompressive laminectomy: Continue to Monitor 08/21/2018Refilled:Oxycodone 10mg /325 mg one tablet five times a day as needed# 140. We will continue the opioid monitoring program,this consists of regular clinic visits,  examinations, urine drug screen,pill counts as well as use of [place] Controlled Substance Reporting System. 2. Insomnia: Continue Xanax08/21/2018 3. Lumbar Radiculopathy/Neuropathic pain: Continue Gabapentin to 300mg  TID. 11/10/2016 4. Muscle Spasms. Continue: Tizanidine. 11/10/2016 5. Neurogenic bowel and bladder- Continue to monitor. 11/10/2016 6. Constipation/Opioid Therapy: Continue Linzess. 11/10/2016 7. Depression: Continue Celexa. 11/10/2016 8. BilateralGreater Trochanteric Bursitis L>R: Continue Heat/Ice Therapy. 11/10/2016 9. Chronic Pain Syndrome: Continue Voltaren 10. Abdominal Discomfort: RX: Abdominal X-ray   20  minutes of face to face patient care time was spent during this visit. All questions were encouraged and answered.   F/U in 1 month

## 2016-12-07 ENCOUNTER — Encounter: Payer: Self-pay | Admitting: Registered Nurse

## 2016-12-07 ENCOUNTER — Encounter: Payer: Self-pay | Attending: Physical Medicine and Rehabilitation | Admitting: Registered Nurse

## 2016-12-07 VITALS — BP 120/79 | HR 96

## 2016-12-07 DIAGNOSIS — M48061 Spinal stenosis, lumbar region without neurogenic claudication: Secondary | ICD-10-CM | POA: Insufficient documentation

## 2016-12-07 DIAGNOSIS — Z5181 Encounter for therapeutic drug level monitoring: Secondary | ICD-10-CM

## 2016-12-07 DIAGNOSIS — G894 Chronic pain syndrome: Secondary | ICD-10-CM

## 2016-12-07 DIAGNOSIS — M7061 Trochanteric bursitis, right hip: Secondary | ICD-10-CM

## 2016-12-07 DIAGNOSIS — M961 Postlaminectomy syndrome, not elsewhere classified: Secondary | ICD-10-CM

## 2016-12-07 DIAGNOSIS — M5126 Other intervertebral disc displacement, lumbar region: Secondary | ICD-10-CM | POA: Insufficient documentation

## 2016-12-07 DIAGNOSIS — Z79899 Other long term (current) drug therapy: Secondary | ICD-10-CM

## 2016-12-07 DIAGNOSIS — F3289 Other specified depressive episodes: Secondary | ICD-10-CM

## 2016-12-07 DIAGNOSIS — M7062 Trochanteric bursitis, left hip: Secondary | ICD-10-CM

## 2016-12-07 DIAGNOSIS — F5101 Primary insomnia: Secondary | ICD-10-CM

## 2016-12-07 DIAGNOSIS — F411 Generalized anxiety disorder: Secondary | ICD-10-CM

## 2016-12-07 DIAGNOSIS — M62830 Muscle spasm of back: Secondary | ICD-10-CM

## 2016-12-07 DIAGNOSIS — G834 Cauda equina syndrome: Secondary | ICD-10-CM | POA: Insufficient documentation

## 2016-12-07 MED ORDER — OXYCODONE-ACETAMINOPHEN 10-325 MG PO TABS: ORAL_TABLET | ORAL | 0 refills | Status: DC

## 2016-12-07 MED ORDER — ALPRAZOLAM 0.25 MG PO TABS: 0.2500 mg | ORAL_TABLET | Freq: Every day | ORAL | 2 refills | Status: DC

## 2016-12-07 NOTE — Progress Notes (Signed)
Subjective:    Patient ID: [name], female    DOB: [birth year], 46 y.o.   MRN: [id number]  HPI:  [name] is a 46year old female who returns for follow up appointmentfor chronic pain and medication refill. She states her pain is located in her lower back, bilateral hips and right knee pain.She rates her pain 10.[place] Kitchen Her current exercise regime is walking.  Also states two weeks ago she was walking down the stairs when she lost her footing, she fell and landed on her buttocks. Her son helped her up she states. She didn't seek medical attention.     Last UDS was performed on 06/08/2016 it was inconsistent +ETOH.   Pain Inventory Average Pain 8 Pain Right Now 10 My pain is sharp, dull, stabbing, tingling and aching  In the last 24 hours, has pain interfered with the following? General activity 10 Relation with others 10 Enjoyment of life 10 What TIME of day is your pain at its worst? all Sleep (in general) NA  Pain is worse with: walking, bending, sitting and standing Pain improves with: medication Relief from Meds: 4  Mobility use a cane do you drive?  no  Function not employed: date last employed . I need assistance with the following:  meal prep, household duties and shopping  Neuro/Psych trouble walking spasms  Prior Studies Any changes since last visit?  no  Physicians involved in your care Any changes since last visit?  no   Family History  Problem Relation Age of Onset  . Hypertension Maternal Grandmother   . Anesthesia problems Neg Hx    Social History   Social History  . Marital status: Divorced    Spouse name: N/A  . Number of children: N/A  . Years of education: N/A   Social History Main Topics  . Smoking status: Never Smoker  . Smokeless tobacco: Never Used  . Alcohol use No  . Drug use: No  . Sexual activity: Not Currently    Birth control/ protection: None     Comment: IUD removed 2 wks ago.   Other Topics Concern  .  None   Social History Narrative  . None   Past Surgical History:  Procedure Laterality Date  . ABDOMINAL HYSTERECTOMY  06/29/11   Robotic Assisted Hysterectomy  . CERVICAL SPINE SURGERY  [place]  . LUMBAR LAMINECTOMY  08/31/2011   Procedure: MICRODISCECTOMY LUMBAR LAMINECTOMY;  Surgeon: Tobi Bastos, MD;  [facility];  Service: Orthopedics;  Laterality: N/A;  lumbar five sacral one central microdisectomy  . MYOMECTOMY  13  . NECK SURGERY    . POSTERIOR CERVICAL FUSION/FORAMINOTOMY N/A 02/02/2013   Procedure: POSTERIOR C5-6 SPINAL FUSION;  Surgeon: Melina Schools, MD;  Location: [place];  Service: Orthopedics;  Laterality: N/A;  . POSTERIOR FUSION CERVICAL SPINE  02/02/2013   C 5 C6     [name]  . WISDOM TOOTH EXTRACTION     Past Medical History:  Diagnosis Date  . Anemia    hx  . Arthritis   . Complication of anesthesia    slow to wake up,   very difficult to get iv had picc line last time  . Difficult intravenous access    hands are usually best for blood draws and IV starts  . DVT (deep venous thrombosis) ([place])    ? vs lupus  . Fibroids   . Headache(784.0)   . Neck pain   .  Scoliosis    BP 120/79   Pulse 96   LMP 04/01/2011   SpO2 99%   Opioid Risk Score:  0 Fall Risk Score:  `1  Depression screen PHQ 2/9  Depression screen [facility] 2/9 12/07/2016 07/07/2016 12/03/2015 06/11/2015 08/27/2014 06/27/2014 05/22/2014  Decreased Interest 1 0 0 0 0 3 0  Down, Depressed, Hopeless 1 2 2 2  0 1 0  PHQ - 2 Score 2 2 2 2  0 4 0  Altered sleeping - - - 3 - 3 -  Tired, decreased energy - - - 3 - 3 -  Change in appetite - - - 3 - 3 -  Feeling bad or failure about yourself  - - - 2 - 1 -  Trouble concentrating - - - 2 - 0 -  Moving slowly or fidgety/restless - - - 2 - 0 -  Suicidal thoughts - - - 0 - 0 -  PHQ-9 Score - - - 17 - 14 -  Difficult doing work/chores - - - Very difficult - - -     Review of Systems  Constitutional: Negative.   HENT: Negative.     Eyes: Negative.   Respiratory: Negative.   Cardiovascular: Negative.   Endocrine: Negative.   Genitourinary: Negative.   Musculoskeletal: Positive for gait problem.       Spasms  Skin: Negative.   Allergic/Immunologic: Negative.   Neurological:       Negative   Hematological: Negative.   Psychiatric/Behavioral: Negative.   All other systems reviewed and are negative.      Objective:   Physical Exam  Constitutional: She is oriented to person, place, and time. She appears well-developed and well-nourished.  HENT:  Head: Normocephalic and atraumatic.  Neck: Normal range of motion. Neck supple.  Cardiovascular: Normal rate and regular rhythm.   Pulmonary/Chest: Effort normal and breath sounds normal.  Musculoskeletal:  Normal Muscle Bulk and Muscle Testing Reveals: Upper Extremities: Full ROM and Muscle Strength 5/5 Lumbar Paraspinal Tenderness: L-3-L-5 Bilateral Greater Trochanter Tenderness Lower Extremities: Full ROM and Muscle Strength 5/5 Right Lower Extremity Flexion Produces Pain into Patella Arises from Table Slowly Using Straight Cane for Support Antalgic gait   Neurological: She is alert and oriented to person, place, and time.  Skin: Skin is warm and dry.  Psychiatric: She has a normal mood and affect.  Nursing note and vitals reviewed.         Assessment & Plan:  1. 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 Status post decompressive laminectomy: Continue to Monitor 09/17/2018Refilled:Oxycodone 10mg /325 mg one tablet five times a day as needed# 140. We will continue the opioid monitoring program,this consists of regular clinic visits, examinations, urine drug screen,pill counts as well as use of [place] Controlled Substance Reporting System. 2. Insomnia/ Anxiety: Continue Xanax09/17/2018 3. Lumbar Radiculopathy/Neuropathic pain: Continue Gabapentin to 300mg  TID. 12/07/2016 4. Muscle Spasms. Continue: Tizanidine.  12/07/2016 5. Neurogenic bowel and bladder- Continue to monitor. 12/07/2016 6. Constipation/Opioid Therapy: Continue Linzess. 12/07/2016 7. Depression: Continue Celexa. 12/07/2016 8. BilateralGreater Trochanteric Bursitis L>R: Continue Heat/Ice Therapy. 12/07/2016 9. Chronic Pain Syndrome: Continue Voltaren. 12/07/2016  20 minutes of face to face patient care time was spent during this visit. All questions were encouraged and answered.  F/U in 1 month

## 2017-01-05 ENCOUNTER — Encounter: Payer: Self-pay | Admitting: Physical Medicine & Rehabilitation

## 2017-01-06 ENCOUNTER — Encounter: Payer: Self-pay | Admitting: Registered Nurse

## 2017-01-06 ENCOUNTER — Encounter: Payer: Self-pay | Attending: Physical Medicine and Rehabilitation | Admitting: Registered Nurse

## 2017-01-06 ENCOUNTER — Telehealth: Payer: Self-pay | Admitting: Registered Nurse

## 2017-01-06 VITALS — BP 125/85 | HR 93

## 2017-01-06 DIAGNOSIS — G834 Cauda equina syndrome: Secondary | ICD-10-CM | POA: Insufficient documentation

## 2017-01-06 DIAGNOSIS — F411 Generalized anxiety disorder: Secondary | ICD-10-CM

## 2017-01-06 DIAGNOSIS — Z79899 Other long term (current) drug therapy: Secondary | ICD-10-CM

## 2017-01-06 DIAGNOSIS — M7062 Trochanteric bursitis, left hip: Secondary | ICD-10-CM

## 2017-01-06 DIAGNOSIS — Z5181 Encounter for therapeutic drug level monitoring: Secondary | ICD-10-CM

## 2017-01-06 DIAGNOSIS — F3289 Other specified depressive episodes: Secondary | ICD-10-CM

## 2017-01-06 DIAGNOSIS — M5416 Radiculopathy, lumbar region: Secondary | ICD-10-CM

## 2017-01-06 DIAGNOSIS — M5126 Other intervertebral disc displacement, lumbar region: Secondary | ICD-10-CM | POA: Insufficient documentation

## 2017-01-06 DIAGNOSIS — M7061 Trochanteric bursitis, right hip: Secondary | ICD-10-CM

## 2017-01-06 DIAGNOSIS — G894 Chronic pain syndrome: Secondary | ICD-10-CM

## 2017-01-06 DIAGNOSIS — M48061 Spinal stenosis, lumbar region without neurogenic claudication: Secondary | ICD-10-CM | POA: Insufficient documentation

## 2017-01-06 DIAGNOSIS — M961 Postlaminectomy syndrome, not elsewhere classified: Secondary | ICD-10-CM

## 2017-01-06 MED ORDER — OXYCODONE-ACETAMINOPHEN 10-325 MG PO TABS: ORAL_TABLET | ORAL | 0 refills | Status: DC

## 2017-01-06 NOTE — Telephone Encounter (Signed)
On 01/06/2017 the [place] was reviewed no conflict was seen on the [place] with multiple prescribers. [name] has a signed narcotic contract with our office. If there were any discrepancies this would have been reported to her physician.

## 2017-01-06 NOTE — Progress Notes (Signed)
Subjective:    Patient ID: [name], female    DOB: Jun 01, 1970, 46 y.o.   MRN: [id number]  HPI:  [name] is a 46year old female who returns for follow up appointmentfor chronic pain and medication refill. She states her pain is located in her lower back radiating into her left hip and right knee pain.She rates her pain 10. Her current exercise regime is walking and water aerobics at [facility] bi-weekly.  Ms. Mcpartland Morphine equivalent is 75.00 MME. She is also prescribed Alprazolam. We have discussed the black box warning of using opioids and benzodiazepines. I highlighted the dangers of using these drugs together and discussed the adverse events including respiratory suppression, overdose, cognitive impairment and importance of  compliance with current regimen. She verbalizes understanding, we will continue to monitor and adjust as indicated.       Last UDS was performed on 06/08/2016 it was inconsistent +ETOH.   Pain Inventory Average Pain 9 Pain Right Now 10 My pain is sharp, dull, stabbing, tingling and aching  In the last 24 hours, has pain interfered with the following? General activity 9 Relation with others 9 Enjoyment of life 10 What TIME of day is your pain at its worst? all Sleep (in general) NA  Pain is worse with: walking, bending, sitting and standing Pain improves with: medication and . Relief from Meds: 8  Mobility use a cane do you drive?  no  Function not employed: date last employed . I need assistance with the following:  household duties and shopping  Neuro/Psych trouble walking spasms  Prior Studies Any changes since last visit?  no  Physicians involved in your care Any changes since last visit?  no   Family History  Problem Relation Age of Onset  . Hypertension Maternal Grandmother   . Anesthesia problems Neg Hx    Social History   Social History  . Marital status: Divorced    Spouse name: N/A  . Number of children:  N/A  . Years of education: N/A   Social History Main Topics  . Smoking status: Never Smoker  . Smokeless tobacco: Never Used  . Alcohol use No  . Drug use: No  . Sexual activity: Not Currently    Birth control/ protection: None     Comment: IUD removed 2 wks ago.   Other Topics Concern  . None   Social History Narrative  . None   Past Surgical History:  Procedure Laterality Date  . ABDOMINAL HYSTERECTOMY  06/29/11   Robotic Assisted Hysterectomy  . CERVICAL SPINE SURGERY  [place]  . LUMBAR LAMINECTOMY  08/31/2011   Procedure: MICRODISCECTOMY LUMBAR LAMINECTOMY;  Surgeon: Tobi Bastos, MD;  Location: [facility];  Service: Orthopedics;  Laterality: N/A;  lumbar five sacral one central microdisectomy  . MYOMECTOMY  13  . NECK SURGERY    . POSTERIOR CERVICAL FUSION/FORAMINOTOMY N/A 02/02/2013   Procedure: POSTERIOR C5-6 SPINAL FUSION;  Surgeon: Melina Schools, MD;  Location: [place];  Service: Orthopedics;  Laterality: N/A;  . POSTERIOR FUSION CERVICAL SPINE  02/02/2013   C 5 C6     [name]  . WISDOM TOOTH EXTRACTION     Past Medical History:  Diagnosis Date  . Anemia    hx  . Arthritis   . Complication of anesthesia    slow to wake up,   very difficult to get iv had picc line last time  . Difficult intravenous access  hands are usually best for blood draws and IV starts  . DVT (deep venous thrombosis) ([place])    ? vs lupus  . Fibroids   . Headache(784.0)   . Neck pain   . Scoliosis    LMP 04/01/2011   Opioid Risk Score:  0 Fall Risk Score:  `1  Depression screen PHQ 2/9  Depression screen [facility] 2/9 01/06/2017 12/07/2016 07/07/2016 12/03/2015 06/11/2015 08/27/2014 06/27/2014  Decreased Interest 1 1 0 0 0 0 3  Down, Depressed, Hopeless 1 1 2 2 2  0 1  PHQ - 2 Score 2 2 2 2 2  0 4  Altered sleeping - - - - 3 - 3  Tired, decreased energy - - - - 3 - 3  Change in appetite - - - - 3 - 3  Feeling bad or failure about yourself  - - - - 2 - 1  Trouble  concentrating - - - - 2 - 0  Moving slowly or fidgety/restless - - - - 2 - 0  Suicidal thoughts - - - - 0 - 0  PHQ-9 Score - - - - 17 - 14  Difficult doing work/chores - - - - Very difficult - -     Review of Systems  Constitutional: Negative.   HENT: Negative.   Eyes: Negative.   Respiratory: Negative.   Cardiovascular: Negative.   Endocrine: Negative.   Genitourinary: Negative.   Musculoskeletal: Positive for gait problem.       Spasms  Skin: Negative.   Allergic/Immunologic: Negative.   Neurological:       Negative   Hematological: Negative.   Psychiatric/Behavioral: Negative.   All other systems reviewed and are negative.      Objective:   Physical Exam  Constitutional: She is oriented to person, place, and time. She appears well-developed and well-nourished.  HENT:  Head: Normocephalic and atraumatic.  Neck: Normal range of motion. Neck supple.  Cardiovascular: Normal rate and regular rhythm.   Pulmonary/Chest: Effort normal and breath sounds normal.  Musculoskeletal:  Normal Muscle Bulk and Muscle Testing Reveals: Upper Extremities: Full ROM and Muscle Strength 5/5 Lumbar Hypersensitivity Left  Greater Trochanter Tenderness Lower Extremities: Full ROM and Muscle Strength 5/5 Right Lower Extremity Flexion Produces Pain into Patella Arises from Table Slowly Using Straight Cane for Support Antalgic gait   Neurological: She is alert and oriented to person, place, and time.  Skin: Skin is warm and dry.  Psychiatric: She has a normal mood and affect.  Nursing note and vitals reviewed.         Assessment & Plan:  1. 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 Status post decompressive laminectomy: Continue to Monitor 10/17/2018Refilled:Oxycodone 10mg /325 mg one tablet five times a day as needed# 140. We will continue the opioid monitoring program,this consists of regular clinic visits, examinations, urine drug screen,pill  counts as well as use of [place] Controlled Substance Reporting System. 2. Insomnia/ Anxiety: Continue Xanax10/17/2018 3. Lumbar Radiculopathy/Neuropathic pain: Continue Gabapentin to 300mg  TID. 01/06/2017 4. Muscle Spasms. Continue: Tizanidine. 01/06/2017 5. Neurogenic bowel and bladder- Continue to monitor. 01/06/2017 6. Constipation/Opioid Therapy: Continue Linzess. 01/06/2017 7. Depression: Continue Celexa. 01/06/2017 8. Left Greater Trochanteric Bursitis: Continue Heat/Ice Therapy. 12/07/2016 9. Chronic Pain Syndrome: Continue Voltaren. 01/06/2017  20 minutes of face to face patient care time was spent during this visit. All questions were encouraged and answered.  F/U in 1 month

## 2017-01-11 ENCOUNTER — Telehealth: Payer: Self-pay | Admitting: *Deleted

## 2017-01-11 ENCOUNTER — Other Ambulatory Visit: Payer: Self-pay | Admitting: *Deleted

## 2017-01-11 ENCOUNTER — Encounter: Payer: Self-pay | Admitting: Physical Medicine & Rehabilitation

## 2017-01-11 DIAGNOSIS — F5101 Primary insomnia: Secondary | ICD-10-CM

## 2017-01-11 LAB — DRUG TOX MONITOR 1 W/CONF, ORAL FLD
AMPHETAMINES: NEGATIVE ng/mL (ref <10)
BARBITURATES: NEGATIVE ng/mL (ref <10)
Benzodiazepines: NEGATIVE ng/mL (ref <0.50)
Buprenorphine: NEGATIVE ng/mL (ref <0.025)
COCAINE: NEGATIVE ng/mL (ref <2.5)
Codeine: NEGATIVE ng/mL (ref <2.5)
DIHYDROCODEINE: NEGATIVE ng/mL (ref <2.5)
FENTANYL: NEGATIVE ng/mL (ref <0.10)
HYDROMORPHONE: NEGATIVE ng/mL (ref <2.5)
Heroin Metabolite: NEGATIVE ng/mL (ref <1.0)
Hydrocodone: NEGATIVE ng/mL (ref <2.5)
MARIJUANA: NEGATIVE ng/mL (ref <2.5)
MDMA: NEGATIVE ng/mL (ref <10)
MEPROBAMATE: NEGATIVE ng/mL (ref <2.5)
METHADONE: NEGATIVE ng/mL (ref <5.0)
MORPHINE: NEGATIVE ng/mL (ref <2.5)
Meperidine: NEGATIVE ng/mL (ref <5.0)
Nicotine Metabolite: NEGATIVE ng/mL (ref <5.0)
Norhydrocodone: NEGATIVE ng/mL (ref <2.5)
Noroxycodone: 9.4 ng/mL — ABNORMAL HIGH (ref <2.5)
Opiates: POSITIVE ng/mL — AB (ref <2.5)
Oxycodone: 33.5 ng/mL — ABNORMAL HIGH (ref <2.5)
Oxymorphone: NEGATIVE ng/mL (ref <2.5)
PHENCYCLIDINE: NEGATIVE ng/mL (ref <10)
Propoxyphene: NEGATIVE ng/mL (ref <5.0)
TRAMADOL: NEGATIVE ng/mL (ref <5.0)
Tapentadol: NEGATIVE ng/mL (ref <5.0)
Zolpidem: NEGATIVE ng/mL (ref <5.0)

## 2017-01-11 LAB — DRUG TOX ALC METAB W/CON, ORAL FLD: Alcohol Metabolite: NEGATIVE ng/mL (ref <25)

## 2017-01-11 MED ORDER — ALPRAZOLAM 0.25 MG PO TABS: 0.2500 mg | ORAL_TABLET | Freq: Every day | ORAL | 2 refills | Status: DC

## 2017-01-11 NOTE — Telephone Encounter (Signed)
Oral swab drug screen was consistent for prescribed medications.  ?

## 2017-02-03 ENCOUNTER — Encounter
Payer: PRIVATE HEALTH INSURANCE | Attending: Physical Medicine and Rehabilitation | Admitting: Physical Medicine & Rehabilitation

## 2017-02-03 ENCOUNTER — Other Ambulatory Visit: Payer: Self-pay

## 2017-02-03 ENCOUNTER — Encounter: Payer: Self-pay | Admitting: Physical Medicine & Rehabilitation

## 2017-02-03 DIAGNOSIS — G834 Cauda equina syndrome: Secondary | ICD-10-CM | POA: Insufficient documentation

## 2017-02-03 DIAGNOSIS — M5126 Other intervertebral disc displacement, lumbar region: Secondary | ICD-10-CM | POA: Insufficient documentation

## 2017-02-03 DIAGNOSIS — M7062 Trochanteric bursitis, left hip: Secondary | ICD-10-CM

## 2017-02-03 DIAGNOSIS — M7061 Trochanteric bursitis, right hip: Secondary | ICD-10-CM

## 2017-02-03 DIAGNOSIS — M48061 Spinal stenosis, lumbar region without neurogenic claudication: Secondary | ICD-10-CM | POA: Insufficient documentation

## 2017-02-03 MED ORDER — OXYCODONE-ACETAMINOPHEN 10-325 MG PO TABS: ORAL_TABLET | ORAL | 0 refills | Status: DC

## 2017-02-03 NOTE — Progress Notes (Signed)
Subjective:    Patient ID: [name], female    DOB: 12-May-1970, 46 y.o.   MRN: [id number]  HPI   Neeya is here in follow up of her chronic pain and cauda equina syndrome. Her pain has been worse with the rain and colder weather over the last. She doesn't get into the pool either like she does in the summer. Her pain levels more typically are 6/10.   Her son helps to keep her motivated and moving. He pushes her to exercise and get out of the house more.  She uses a cane for balance.  She does do some stretching and uses her Thera-Band occasionally at home  She remains on percocet for breakthrough pain which provides relief.  She is been able to get down to the 140 amount over the last few months.  She continues on Zanaflex for spasm and gabapentin for neuropathic pain.  Bowels have been more regular with Linzess.  She is emptying her bladder without issue currently.     Pain Inventory Average Pain 9 Pain Right Now 10 My pain is sharp, dull, stabbing, tingling and aching  In the last 24 hours, has pain interfered with the following? General activity 9 Relation with others 9 Enjoyment of life 9 What TIME of day is your pain at its worst? all Sleep (in general) Poor  Pain is worse with: walking, bending, sitting and standing Pain improves with: rest, heat/ice and medication Relief from Meds: 3  Mobility use a cane ability to climb steps?  no do you drive?  no  Function not employed: date last employed . I need assistance with the following:  meal prep, household duties and shopping  Neuro/Psych No problems in this area  Prior Studies Any changes since last visit?  no  Physicians involved in your care Any changes since last visit?  no   Family History  Problem Relation Age of Onset  . Hypertension Maternal Grandmother   . Anesthesia problems Neg Hx    Social History   Socioeconomic History  . Marital status: Divorced    Spouse name: None  . Number of  children: None  . Years of education: None  . Highest education level: None  Social Needs  . Financial resource strain: None  . Food insecurity - worry: None  . Food insecurity - inability: None  . Transportation needs - medical: None  . Transportation needs - non-medical: None  Occupational History  . None  Tobacco Use  . Smoking status: Never Smoker  . Smokeless tobacco: Never Used  Substance and Sexual Activity  . Alcohol use: No  . Drug use: No  . Sexual activity: Not Currently    Birth control/protection: None    Comment: IUD removed 2 wks ago.  Other Topics Concern  . None  Social History Narrative  . None   Past Surgical History:  Procedure Laterality Date  . ABDOMINAL HYSTERECTOMY  06/29/11   Robotic Assisted Hysterectomy  . CERVICAL SPINE SURGERY  [place]  . MYOMECTOMY  13  . NECK SURGERY    . POSTERIOR FUSION CERVICAL SPINE  02/02/2013   C 5 C6     [name]  . WISDOM TOOTH EXTRACTION     Past Medical History:  Diagnosis Date  . Anemia    hx  . Arthritis   . Complication of anesthesia    slow to wake up,   very difficult to get iv had  picc line last time  . Difficult intravenous access    hands are usually best for blood draws and IV starts  . DVT (deep venous thrombosis) ([place])    ? vs lupus  . Fibroids   . Headache(784.0)   . Neck pain   . Scoliosis    BP 105/77   Pulse (!) 102   LMP 04/01/2011   SpO2 96%   Opioid Risk Score:  0 Fall Risk Score:  `1  Depression screen PHQ 2/9  Depression screen [facility] 2/9 02/03/2017 01/06/2017 12/07/2016 07/07/2016 12/03/2015 06/11/2015 08/27/2014  Decreased Interest 0 1 1 0 0 0 0  Down, Depressed, Hopeless 0 1 1 2 2 2  0  PHQ - 2 Score 0 2 2 2 2 2  0  Altered sleeping - - - - - 3 -  Tired, decreased energy - - - - - 3 -  Change in appetite - - - - - 3 -  Feeling bad or failure about yourself  - - - - - 2 -  Trouble concentrating - - - - - 2 -  Moving slowly or fidgety/restless - - - - - 2 -    Suicidal thoughts - - - - - 0 -  PHQ-9 Score - - - - - 17 -  Difficult doing work/chores - - - - - Very difficult -     Review of Systems  Constitutional: Negative.   HENT: Negative.   Eyes: Negative.   Respiratory: Negative.   Cardiovascular: Negative.   Gastrointestinal: Negative.   Endocrine: Negative.   Genitourinary: Negative.   Musculoskeletal: Negative.   Skin: Negative.   Allergic/Immunologic: Negative.   Neurological: Negative.   Hematological: Negative.   Psychiatric/Behavioral: Negative.        Objective:   Physical Exam  Constitutional: She is oriented to person, place, and time. She appears well-developed and well-nourished.  HENT:  Head: Normocephalic and atraumatic.  Neck: Normal range of motion. Neck supple.  Cardiovascular:   Regular rate  Pulmonary/Chest:   clear with normal effort   Musculoskeletal:  Normal Muscle Bulk and Muscle Testing Reveals: Upper Extremities: Full ROM and Muscle Strength 5/5 Lumbar spine with generalized tenderness to palpation Bilateral Greater Trochanteric Tenderness, primarily on the left side however Lower Extremities: Full ROM and Muscle Strength 5/5  gait has improved quite a bit.  She does go into mild recurvatum at each knee during stance.  She was able to correct it with cues.  She uses her straight cane for balance.  Base is more narrow than it has been in the past. Neurological: She is alert and oriented to person, place, and time.  Skin: Skin is warm and dry.  Psychiatric: She has a normal mood and affect. plesant.       Assessment & Plan:  1. 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 Status post decompressive laminectomy:  Refilled: Oxycodone 10mg /325 mg 1 tablet every 6 hours prn,  #140. We will continue the opioid monitoring program, this consists of regular clinic visits, examinations, urine drug screen, pill counts as well as use of [place] Controlled Substance Reporting System.  [place] was reviewed today.  -I explained to her that I would like to gradually reduce her down to 120 pills/month.  She is in agreement. -continue voltaren 75mg  bid which remain efficacious  -reviewed gait strategies to help take load off knees.  2. Insomnia: Low-dose Xanax at bedtime  3. Neuropathic pain: Continue Gabapentin at 300mg  TID  4.  Muscle Spasms. Continue: Tizanidine 5. Neurogenic bowel and bladder- Continue to monitor 6. Constipation/Opioid Therapy: Continue Linzess 7. Depression: Continue Celexa 8. Bilateral Greater Trochanteric Bursitis: Specific exercises were provided and explained to the patient today.  Discussed the use of ice and avoidance of exacerbating movements and positions  9. Mild right RTC strain--improved.  15 minutes of face to face patient care time was spent during this visit. All questions were encouraged and answered. follow up in about a month with NP

## 2017-02-03 NOTE — Patient Instructions (Signed)
PLEASE FEEL FREE TO CALL OUR OFFICE WITH ANY PROBLEMS OR QUESTIONS ([phone])    WORK ON KEEPING A SLIGHT BEND IN YOUR KNEE (WEIGHT BEARING SIDE) WHEN YOU WALK

## 2017-03-08 ENCOUNTER — Encounter: Payer: Self-pay | Admitting: Registered Nurse

## 2017-03-08 ENCOUNTER — Other Ambulatory Visit: Payer: Self-pay

## 2017-03-08 ENCOUNTER — Encounter: Payer: PRIVATE HEALTH INSURANCE | Attending: Physical Medicine and Rehabilitation | Admitting: Registered Nurse

## 2017-03-08 VITALS — BP 154/86 | HR 96

## 2017-03-08 DIAGNOSIS — M5416 Radiculopathy, lumbar region: Secondary | ICD-10-CM

## 2017-03-08 DIAGNOSIS — M5126 Other intervertebral disc displacement, lumbar region: Secondary | ICD-10-CM

## 2017-03-08 DIAGNOSIS — F5101 Primary insomnia: Secondary | ICD-10-CM

## 2017-03-08 DIAGNOSIS — G894 Chronic pain syndrome: Secondary | ICD-10-CM

## 2017-03-08 DIAGNOSIS — M961 Postlaminectomy syndrome, not elsewhere classified: Secondary | ICD-10-CM

## 2017-03-08 DIAGNOSIS — Z5181 Encounter for therapeutic drug level monitoring: Secondary | ICD-10-CM

## 2017-03-08 DIAGNOSIS — M7061 Trochanteric bursitis, right hip: Secondary | ICD-10-CM

## 2017-03-08 DIAGNOSIS — M48061 Spinal stenosis, lumbar region without neurogenic claudication: Secondary | ICD-10-CM | POA: Insufficient documentation

## 2017-03-08 DIAGNOSIS — G834 Cauda equina syndrome: Secondary | ICD-10-CM

## 2017-03-08 DIAGNOSIS — M7062 Trochanteric bursitis, left hip: Secondary | ICD-10-CM

## 2017-03-08 DIAGNOSIS — Z79899 Other long term (current) drug therapy: Secondary | ICD-10-CM

## 2017-03-08 MED ORDER — GABAPENTIN 300 MG PO CAPS: 300.0000 mg | ORAL_CAPSULE | Freq: Three times a day (TID) | ORAL | 3 refills | Status: DC

## 2017-03-08 MED ORDER — OXYCODONE-ACETAMINOPHEN 10-325 MG PO TABS: ORAL_TABLET | ORAL | 0 refills | Status: DC

## 2017-03-08 MED ORDER — DICLOFENAC SODIUM 75 MG PO TBEC: 75.0000 mg | DELAYED_RELEASE_TABLET | Freq: Two times a day (BID) | ORAL | 3 refills | Status: DC

## 2017-03-08 MED ORDER — CITALOPRAM HYDROBROMIDE 10 MG PO TABS: 10.0000 mg | ORAL_TABLET | Freq: Every day | ORAL | 3 refills | Status: DC

## 2017-03-08 MED ORDER — PROMETHAZINE HCL 12.5 MG PO TABS
12.5000 mg | ORAL_TABLET | Freq: Four times a day (QID) | ORAL | 3 refills | Status: DC | PRN
Start: 2017-03-08 — End: 2021-12-16

## 2017-03-08 MED ORDER — TIZANIDINE HCL 4 MG PO TABS: ORAL_TABLET | ORAL | 3 refills | Status: DC

## 2017-03-08 MED ORDER — ALPRAZOLAM 0.25 MG PO TABS: 0.2500 mg | ORAL_TABLET | Freq: Every day | ORAL | 2 refills | Status: DC

## 2017-03-08 NOTE — Progress Notes (Signed)
Subjective:    Patient ID: [name], female    DOB: 12-03-1970, 46 y.o.   MRN: [id number]  HPI:  [name] is a 46year old female who returns for follow up appointmentfor chronic pain and medication refill. She states her pain is located in her neck, lower back radiating into her left hip and right lower extremity.She rates her pain 8. Her current exercise regime is walking and when she is able she attends water aerobics at [facility] bi-weekly.  [name] equivalent is 75.00 MME. She is also prescribed Alprazolam. We have reviewed the black box warning of using opioids and benzodiazepines. I highlighted the dangers of using these drugs together and discussed the adverse events including respiratory suppression, overdose, cognitive impairment and importance of  compliance with current regimen. She verbalizes understanding, we will continue to monitor and adjust as indicated.       Oral Swab was performed on 01/06/2017 it was consistent.   Pain Inventory Average Pain 9 Pain Right Now 8 My pain is burning, dull, stabbing and aching  In the last 24 hours, has pain interfered with the following? General activity 9 Relation with others 10 Enjoyment of life 9 What TIME of day is your pain at its worst? all Sleep (in general) Poor  Pain is worse with: walking, sitting, standing and some activites Pain improves with: medication and . Relief from Meds: 4  Mobility use a cane ability to climb steps?  no do you drive?  no  Function not employed: date last employed . I need assistance with the following:  .  Neuro/Psych No problems in this area  Prior Studies Any changes since last visit?  no  Physicians involved in your care Any changes since last visit?  no   Family History  Problem Relation Age of Onset  . Hypertension Maternal Grandmother   . Anesthesia problems Neg Hx    Social History   Socioeconomic History  . Marital status: Divorced   Spouse name: Not on file  . Number of children: Not on file  . Years of education: Not on file  . Highest education level: Not on file  Social Needs  . Financial resource strain: Not on file  . Food insecurity - worry: Not on file  . Food insecurity - inability: Not on file  . Transportation needs - medical: Not on file  . Transportation needs - non-medical: Not on file  Occupational History  . Not on file  Tobacco Use  . Smoking status: Never Smoker  . Smokeless tobacco: Never Used  Substance and Sexual Activity  . Alcohol use: No  . Drug use: No  . Sexual activity: Not Currently    Birth control/protection: None    Comment: IUD removed 2 wks ago.  Other Topics Concern  . Not on file  Social History Narrative  . Not on file   Past Surgical History:  Procedure Laterality Date  . ABDOMINAL HYSTERECTOMY  06/29/11   Robotic Assisted Hysterectomy  . CERVICAL SPINE SURGERY  [place]  . LUMBAR LAMINECTOMY  08/31/2011   Procedure: MICRODISCECTOMY LUMBAR LAMINECTOMY;  Surgeon: Tobi Bastos, MD;  Location: [facility];  Service: Orthopedics;  Laterality: N/A;  lumbar five sacral one central microdisectomy  . MYOMECTOMY  13  . NECK SURGERY    . POSTERIOR CERVICAL FUSION/FORAMINOTOMY N/A 02/02/2013   Procedure: POSTERIOR C5-6 SPINAL FUSION;  Surgeon: Melina Schools, MD;  Location: [place];  Service: Orthopedics;  Laterality: N/A;  . POSTERIOR FUSION CERVICAL SPINE  02/02/2013   C 5 C6     [name]  . WISDOM TOOTH EXTRACTION     Past Medical History:  Diagnosis Date  . Anemia    hx  . Arthritis   . Complication of anesthesia    slow to wake up,   very difficult to get iv had picc line last time  . Difficult intravenous access    hands are usually best for blood draws and IV starts  . DVT (deep venous thrombosis) ([place])    ? vs lupus  . Fibroids   . Headache(784.0)   . Neck pain   . Scoliosis    LMP 04/01/2011   Opioid Risk Score:  0 Fall Risk Score:   `1  Depression screen PHQ 2/9  Depression screen [facility] 2/9 03/08/2017 02/03/2017 01/06/2017 12/07/2016 07/07/2016 12/03/2015 06/11/2015  Decreased Interest 1 0 1 1 0 0 0  Down, Depressed, Hopeless 1 0 1 1 2 2 2   PHQ - 2 Score 2 0 2 2 2 2 2   Altered sleeping - - - - - - 3  Tired, decreased energy - - - - - - 3  Change in appetite - - - - - - 3  Feeling bad or failure about yourself  - - - - - - 2  Trouble concentrating - - - - - - 2  Moving slowly or fidgety/restless - - - - - - 2  Suicidal thoughts - - - - - - 0  PHQ-9 Score - - - - - - 17  Difficult doing work/chores - - - - - - Very difficult     Review of Systems  Constitutional: Negative.   HENT: Negative.   Eyes: Negative.   Respiratory: Negative.   Cardiovascular: Negative.   Endocrine: Negative.   Genitourinary: Negative.   Musculoskeletal: Positive for gait problem.       Spasms  Skin: Negative.   Allergic/Immunologic: Negative.   Neurological:       Negative   Hematological: Negative.   Psychiatric/Behavioral: Negative.   All other systems reviewed and are negative.      Objective:   Physical Exam  Constitutional: She is oriented to person, place, and time. She appears well-developed and well-nourished.  HENT:  Head: Normocephalic and atraumatic.  Neck: Normal range of motion. Neck supple.  Cervical Paraspinal Tenderness: C-5-C-6  Cardiovascular: Normal rate and regular rhythm.  Pulmonary/Chest: Effort normal and breath sounds normal.  Musculoskeletal:  Normal Muscle Bulk and Muscle Testing Reveals: Upper Extremities: Full ROM and Muscle Strength 5/5 Lumbar Hypersensitivity Left  Greater Trochanter Tenderness Lower Extremities: Full ROM and Muscle Strength 5/5 Right Lower Extremity Flexion Produces Pain into Patella Arises from Table Slowly Using Straight Cane for Support Antalgic gait   Neurological: She is alert and oriented to person, place, and time.  Skin: Skin is warm and dry.  Psychiatric: She  has a normal mood and affect.  Nursing note and vitals reviewed.         Assessment & Plan:  1. 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 Status post decompressive laminectomy: Continue to Monitor 12/17/2018Refilled:Oxycodone 10mg /325 mg one tablet five times a day as needed#140. We will continue the opioid monitoring program,this consists of regular clinic visits, examinations, urine drug screen,pill counts as well as use of [place] Controlled Substance Reporting System. 2. Insomnia/ Anxiety: Continue Xanax12/17/2018 3. Lumbar Radiculopathy/Neuropathic pain: Continue Gabapentin to  300mg  TID. 03/08/2017 4. Muscle Spasms. Continue: Tizanidine. 03/08/2017 5. Neurogenic bowel and bladder- Continue to monitor. 03/08/2017 6. Constipation/Opioid Therapy: Continue Linzess. 03/08/2017 7. Depression: Continue Celexa. 03/08/2017 8. Left Greater Trochanteric Bursitis: Continue Heat/Ice Therapy. 03/08/2017 9. Chronic Pain Syndrome: Continue Voltaren. 03/08/2017  20 minutes of face to face patient care time was spent during this visit. All questions were encouraged and answered.  F/U in 1 month

## 2017-04-12 ENCOUNTER — Encounter: Payer: Self-pay | Admitting: Registered Nurse

## 2017-04-12 ENCOUNTER — Encounter: Payer: PRIVATE HEALTH INSURANCE | Attending: Physical Medicine and Rehabilitation | Admitting: Registered Nurse

## 2017-04-12 VITALS — BP 136/89 | HR 80

## 2017-04-12 DIAGNOSIS — F411 Generalized anxiety disorder: Secondary | ICD-10-CM

## 2017-04-12 DIAGNOSIS — M7062 Trochanteric bursitis, left hip: Secondary | ICD-10-CM

## 2017-04-12 DIAGNOSIS — M7061 Trochanteric bursitis, right hip: Secondary | ICD-10-CM

## 2017-04-12 DIAGNOSIS — G894 Chronic pain syndrome: Secondary | ICD-10-CM

## 2017-04-12 DIAGNOSIS — M62830 Muscle spasm of back: Secondary | ICD-10-CM

## 2017-04-12 DIAGNOSIS — M5416 Radiculopathy, lumbar region: Secondary | ICD-10-CM

## 2017-04-12 DIAGNOSIS — Z79899 Other long term (current) drug therapy: Secondary | ICD-10-CM

## 2017-04-12 DIAGNOSIS — F5101 Primary insomnia: Secondary | ICD-10-CM

## 2017-04-12 DIAGNOSIS — M961 Postlaminectomy syndrome, not elsewhere classified: Secondary | ICD-10-CM

## 2017-04-12 DIAGNOSIS — F3289 Other specified depressive episodes: Secondary | ICD-10-CM

## 2017-04-12 DIAGNOSIS — G834 Cauda equina syndrome: Secondary | ICD-10-CM

## 2017-04-12 DIAGNOSIS — Z5181 Encounter for therapeutic drug level monitoring: Secondary | ICD-10-CM

## 2017-04-12 DIAGNOSIS — M5126 Other intervertebral disc displacement, lumbar region: Secondary | ICD-10-CM | POA: Insufficient documentation

## 2017-04-12 DIAGNOSIS — M48061 Spinal stenosis, lumbar region without neurogenic claudication: Secondary | ICD-10-CM | POA: Insufficient documentation

## 2017-04-12 MED ORDER — OXYCODONE-ACETAMINOPHEN 10-325 MG PO TABS: ORAL_TABLET | ORAL | 0 refills | Status: DC

## 2017-04-12 NOTE — Progress Notes (Signed)
Subjective:    Patient ID: [name], female    DOB: [birth year], 47 y.o.   MRN: [id number]  HPI:  [name] is a 47year old female who returns for follow up appointmentfor chronic pain and medication refill. She states her pain is located in her lower back radiating into her left hip and right knee pain. She rates her pain 10. Her current exercise regime is walking. She rates her pain 8. Her current exercise regime is walking.  [name] Morphine equivalent is 62.50 MME. She is also prescribed Alprazolam. We have reviewed the black box warning again regarding using opioids and benzodiazepines. I highlighted the dangers of using these drugs together and discussed the adverse events including respiratory suppression, overdose, cognitive impairment and importance of  compliance with current regimen. She verbalizes understanding, we will continue to monitor and adjust as indicated.     Oral Swab was performed on 01/06/2017 it was consistent. Oral Swab Performed Today.  Son in the room.  Pain Inventory Average Pain 9 Pain Right Now 10 My pain is burning, dull, stabbing and aching  In the last 24 hours, has pain interfered with the following? General activity 10 Relation with others 10 Enjoyment of life 10 What TIME of day is your pain at its worst? all Sleep (in general) Poor  Pain is worse with: walking, sitting, standing and some activites Pain improves with: medication and . Relief from Meds: 2  Mobility use a cane ability to climb steps?  no do you drive?  no  Function not employed: date last employed . I need assistance with the following:  .  Neuro/Psych No problems in this area  Prior Studies Any changes since last visit?  no  Physicians involved in your care Any changes since last visit?  no   Family History  Problem Relation Age of Onset  . Hypertension Maternal Grandmother   . Anesthesia problems Neg Hx    Social History    Socioeconomic History  . Marital status: Divorced    Spouse name: Not on file  . Number of children: Not on file  . Years of education: Not on file  . Highest education level: Not on file  Social Needs  . Financial resource strain: Not on file  . Food insecurity - worry: Not on file  . Food insecurity - inability: Not on file  . Transportation needs - medical: Not on file  . Transportation needs - non-medical: Not on file  Occupational History  . Not on file  Tobacco Use  . Smoking status: Never Smoker  . Smokeless tobacco: Never Used  Substance and Sexual Activity  . Alcohol use: No  . Drug use: No  . Sexual activity: Not Currently    Birth control/protection: None    Comment: IUD removed 2 wks ago.  Other Topics Concern  . Not on file  Social History Narrative  . Not on file   Past Surgical History:  Procedure Laterality Date  . ABDOMINAL HYSTERECTOMY  06/29/11   Robotic Assisted Hysterectomy  . CERVICAL SPINE SURGERY  [place]  . LUMBAR LAMINECTOMY  08/31/2011   Procedure: MICRODISCECTOMY LUMBAR LAMINECTOMY;  Surgeon: Tobi Bastos, MD;  Location: [facility];  Service: Orthopedics;  Laterality: N/A;  lumbar five sacral one central microdisectomy  . MYOMECTOMY  13  . NECK SURGERY    . POSTERIOR CERVICAL FUSION/FORAMINOTOMY N/A 02/02/2013   Procedure: POSTERIOR C5-6 SPINAL FUSION;  Surgeon: Melina Schools, MD;  Location: [place];  Service: Orthopedics;  Laterality: N/A;  . POSTERIOR FUSION CERVICAL SPINE  02/02/2013   C 5 C6     [name]  . WISDOM TOOTH EXTRACTION     Past Medical History:  Diagnosis Date  . Anemia    hx  . Arthritis   . Complication of anesthesia    slow to wake up,   very difficult to get iv had picc line last time  . Difficult intravenous access    hands are usually best for blood draws and IV starts  . DVT (deep venous thrombosis) ([place])    ? vs lupus  . Fibroids   . Headache(784.0)   . Neck pain   . Scoliosis    LMP  04/01/2011   Opioid Risk Score:  0 Fall Risk Score:  `1  Depression screen PHQ 2/9  Depression screen [facility] 2/9 03/08/2017 02/03/2017 01/06/2017 12/07/2016 07/07/2016 12/03/2015 06/11/2015  Decreased Interest 1 0 1 1 0 0 0  Down, Depressed, Hopeless 1 0 1 1 2 2 2   PHQ - 2 Score 2 0 2 2 2 2 2   Altered sleeping - - - - - - 3  Tired, decreased energy - - - - - - 3  Change in appetite - - - - - - 3  Feeling bad or failure about yourself  - - - - - - 2  Trouble concentrating - - - - - - 2  Moving slowly or fidgety/restless - - - - - - 2  Suicidal thoughts - - - - - - 0  PHQ-9 Score - - - - - - 17  Difficult doing work/chores - - - - - - Very difficult     Review of Systems  Constitutional: Negative.   HENT: Negative.   Eyes: Negative.   Respiratory: Negative.   Cardiovascular: Negative.   Endocrine: Negative.   Genitourinary: Negative.   Musculoskeletal: Positive for back pain and gait problem.       Spasms  Skin: Negative.   Allergic/Immunologic: Negative.   Neurological:       Negative   Hematological: Negative.   Psychiatric/Behavioral: Negative.   All other systems reviewed and are negative.      Objective:   Physical Exam  Constitutional: She is oriented to person, place, and time. She appears well-developed and well-nourished.  HENT:  Head: Normocephalic and atraumatic.  Neck: Normal range of motion. Neck supple.  Cervical Paraspinal Tenderness: C-5-C-6  Cardiovascular: Normal rate and regular rhythm.  Pulmonary/Chest: Effort normal and breath sounds normal.  Musculoskeletal:  Normal Muscle Bulk and Muscle Testing Reveals: Upper Extremities: Full ROM and Muscle Strength 5/5 Thoracic and Lumbar Hypersensitivity Left:Greater Trochanter Tenderness Lower Extremities: FullROM and Muscle Strength 5/5 Right Lower Extremity Flexion Produces Pain into Patella Arises from Table Slowly Using Straight Cane for Support Antalgic gait   Neurological: She is alert and  oriented to person, place, and time.  Skin: Skin is warm and dry.  Psychiatric: She has a normal mood and affect.  Nursing note and vitals reviewed.         Assessment & Plan:  1. 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 Status post decompressive laminectomy: Continue to Monitor 04/12/2017: Refilled:Oxycodone 10mg /325 mg one tablet five times a day as needed#140. We will continue the opioid monitoring program,this consists of regular clinic visits, examinations, urine drug screen,pill counts as well as use of [place] Controlled Substance Reporting System.  2. Insomnia/ Anxiety: Continue Xanax. 04/12/2017 3. Lumbar Radiculopathy/Neuropathic pain: Continue Gabapentin to 300mg  TID. 04/12/2017 4. Muscle Spasms. Continue: Tizanidine. 04/12/2017 5. Neurogenic bowel and bladder- Continue to monitor. 04/12/2017 6. Constipation/Opioid Therapy: Continue Linzess. 04/12/2017 7. Depression: Continue Celexa. 04/12/2017 8. Left Greater Trochanteric Bursitis: Continue Heat/Ice Therapy. 04/12/2017 9. Chronic Pain Syndrome: Continue Voltaren. 04/12/2017  20 minutes of face to face patient care time was spent during this visit. All questions were encouraged and answered.  F/U in 1 month

## 2017-04-16 LAB — DRUG TOX MONITOR 1 W/CONF, ORAL FLD
Amphetamines: NEGATIVE ng/mL (ref <10)
BUPRENORPHINE: NEGATIVE ng/mL (ref <0.10)
Barbiturates: NEGATIVE ng/mL (ref <10)
Benzodiazepines: NEGATIVE ng/mL (ref <0.50)
COCAINE: NEGATIVE ng/mL (ref <5.0)
CODEINE: NEGATIVE ng/mL (ref <2.5)
Dihydrocodeine: NEGATIVE ng/mL (ref <2.5)
Fentanyl: NEGATIVE ng/mL (ref <0.10)
HEROIN METABOLITE: NEGATIVE ng/mL (ref <1.0)
Hydrocodone: NEGATIVE ng/mL (ref <2.5)
Hydromorphone: NEGATIVE ng/mL (ref <2.5)
MARIJUANA: NEGATIVE ng/mL (ref <2.5)
MDMA: NEGATIVE ng/mL (ref <10)
MEPROBAMATE: NEGATIVE ng/mL (ref <2.5)
MORPHINE: NEGATIVE ng/mL (ref <2.5)
Methadone: NEGATIVE ng/mL (ref <5.0)
NOROXYCODONE: 6.2 ng/mL — AB (ref <2.5)
Nicotine Metabolite: NEGATIVE ng/mL (ref <5.0)
Norhydrocodone: NEGATIVE ng/mL (ref <2.5)
OXYCODONE: 16.9 ng/mL — AB (ref <2.5)
OXYMORPHONE: NEGATIVE ng/mL (ref <2.5)
Opiates: POSITIVE ng/mL — AB (ref <2.5)
Phencyclidine: NEGATIVE ng/mL (ref <10)
TAPENTADOL: NEGATIVE ng/mL (ref <5.0)
Tramadol: NEGATIVE ng/mL (ref <5.0)
Zolpidem: NEGATIVE ng/mL (ref <5.0)

## 2017-04-16 LAB — DRUG TOX ALC METAB W/CON, ORAL FLD: ALCOHOL METABOLITE: NEGATIVE ng/mL (ref <25)

## 2017-04-17 ENCOUNTER — Other Ambulatory Visit: Payer: Self-pay | Admitting: Registered Nurse

## 2017-04-17 DIAGNOSIS — F5101 Primary insomnia: Secondary | ICD-10-CM

## 2017-04-20 ENCOUNTER — Encounter: Payer: Self-pay | Admitting: *Deleted

## 2017-04-20 ENCOUNTER — Telehealth: Payer: Self-pay | Admitting: *Deleted

## 2017-04-20 NOTE — Telephone Encounter (Signed)
error 

## 2017-04-20 NOTE — Telephone Encounter (Signed)
Oral swab drug screen was consistent for prescribed medications. There is no report of last dose of alprazolam. No benzodiazepines present.

## 2017-05-10 ENCOUNTER — Encounter: Payer: Self-pay | Admitting: Registered Nurse

## 2017-05-10 ENCOUNTER — Other Ambulatory Visit: Payer: Self-pay

## 2017-05-10 ENCOUNTER — Encounter
Payer: No Typology Code available for payment source | Attending: Physical Medicine and Rehabilitation | Admitting: Registered Nurse

## 2017-05-10 VITALS — BP 113/72 | HR 86

## 2017-05-10 DIAGNOSIS — G834 Cauda equina syndrome: Secondary | ICD-10-CM

## 2017-05-10 DIAGNOSIS — F411 Generalized anxiety disorder: Secondary | ICD-10-CM

## 2017-05-10 DIAGNOSIS — G8929 Other chronic pain: Secondary | ICD-10-CM

## 2017-05-10 DIAGNOSIS — F3289 Other specified depressive episodes: Secondary | ICD-10-CM

## 2017-05-10 DIAGNOSIS — M961 Postlaminectomy syndrome, not elsewhere classified: Secondary | ICD-10-CM

## 2017-05-10 DIAGNOSIS — F5101 Primary insomnia: Secondary | ICD-10-CM

## 2017-05-10 DIAGNOSIS — M5416 Radiculopathy, lumbar region: Secondary | ICD-10-CM

## 2017-05-10 DIAGNOSIS — M62830 Muscle spasm of back: Secondary | ICD-10-CM

## 2017-05-10 DIAGNOSIS — M545 Low back pain: Secondary | ICD-10-CM

## 2017-05-10 DIAGNOSIS — M5126 Other intervertebral disc displacement, lumbar region: Secondary | ICD-10-CM | POA: Insufficient documentation

## 2017-05-10 DIAGNOSIS — M7061 Trochanteric bursitis, right hip: Secondary | ICD-10-CM

## 2017-05-10 DIAGNOSIS — M48061 Spinal stenosis, lumbar region without neurogenic claudication: Secondary | ICD-10-CM | POA: Insufficient documentation

## 2017-05-10 MED ORDER — ALPRAZOLAM 0.25 MG PO TABS: 0.2500 mg | ORAL_TABLET | Freq: Every day | ORAL | 2 refills | Status: DC

## 2017-05-10 MED ORDER — OXYCODONE-ACETAMINOPHEN 10-325 MG PO TABS: ORAL_TABLET | ORAL | 0 refills | Status: DC

## 2017-05-10 MED ORDER — METHYLPREDNISOLONE 4 MG PO TBPK: ORAL_TABLET | ORAL | 0 refills | Status: DC

## 2017-05-10 NOTE — Progress Notes (Signed)
Subjective:    Patient ID: [name], female    DOB: 16-Dec-1970, 47 y.o.   MRN: [id number]  HPI:  [name] is a 47year old female who returns for follow up appointmentfor chronic pain and medication refill. She states her pain is located in her lower back, radiating into her right hip and right lower extremity. Also reports her pain has intensified over the last week, [name] tearful, emotional support given and all questions answered. We will prescribed Medrol dose pak, we discuss different treatment modalities, due to no insurance and cost of medication her options are limited. We discuss applying for patient assistance programs with drig companies, she verbalizes understanding.   She rates her pain 10. She's not following her usual exercise regimen due to increase intensity of pain.    [name] Morphine equivalent is 62.50 MME. She is also prescribed Alprazolam. We have reviewed the black box warning again regarding using opioids and benzodiazepines. I highlighted the dangers of using these drugs together and discussed the adverse events including respiratory suppression, overdose, cognitive impairment and importance of  compliance with current regimen. She verbalizes understanding, we will continue to monitor and adjust as indicated.     Oral Swab was Performed on 04/12/2017 it was consistent.   Mother in the room.  Pain Inventory Average Pain 10 Pain Right Now 10 My pain is burning, dull, stabbing and aching  In the last 24 hours, has pain interfered with the following? General activity 10 Relation with others 10 Enjoyment of life 10 What TIME of day is your pain at its worst? all Sleep (in general) Poor  Pain is worse with: walking, sitting, standing and some activites Pain improves with: medication and . Relief from Meds: 0  Mobility use a cane ability to climb steps?  no do you drive?  no  Function not employed: date last employed . I need  assistance with the following:  .  Neuro/Psych No problems in this area  Prior Studies Any changes since last visit?  no  Physicians involved in your care Any changes since last visit?  no   Family History  Problem Relation Age of Onset  . Hypertension Maternal Grandmother   . Anesthesia problems Neg Hx    Social History   Socioeconomic History  . Marital status: Divorced    Spouse name: None  . Number of children: None  . Years of education: None  . Highest education level: None  Social Needs  . Financial resource strain: None  . Food insecurity - worry: None  . Food insecurity - inability: None  . Transportation needs - medical: None  . Transportation needs - non-medical: None  Occupational History  . None  Tobacco Use  . Smoking status: Never Smoker  . Smokeless tobacco: Never Used  Substance and Sexual Activity  . Alcohol use: No  . Drug use: No  . Sexual activity: Not Currently    Birth control/protection: None    Comment: IUD removed 2 wks ago.  Other Topics Concern  . None  Social History Narrative  . None   Past Surgical History:  Procedure Laterality Date  . ABDOMINAL HYSTERECTOMY  06/29/11   Robotic Assisted Hysterectomy  . CERVICAL SPINE SURGERY  [place]  . LUMBAR LAMINECTOMY  08/31/2011   Procedure: MICRODISCECTOMY LUMBAR LAMINECTOMY;  Surgeon: [name];  Location: [facility];  Service: Orthopedics;  Laterality: N/A;  lumbar five sacral  one central microdisectomy  . MYOMECTOMY  13  . NECK SURGERY    . POSTERIOR CERVICAL FUSION/FORAMINOTOMY N/A 02/02/2013   Procedure: POSTERIOR C5-6 SPINAL FUSION;  Surgeon: [name];  Location: [place];  Service: Orthopedics;  Laterality: N/A;  . POSTERIOR FUSION CERVICAL SPINE  02/02/2013   C 5 C6     Dr [name]  . WISDOM TOOTH EXTRACTION     Past Medical History:  Diagnosis Date  . Anemia    hx  . Arthritis   . Complication of anesthesia    slow to wake up,   very  difficult to get iv had picc line last time  . Difficult intravenous access    hands are usually best for blood draws and IV starts  . DVT (deep venous thrombosis) ([place])    ? vs lupus  . Fibroids   . Headache(784.0)   . Neck pain   . Scoliosis    BP 113/72   Pulse 86   LMP 04/01/2011   SpO2 97%   Opioid Risk Score:  0 Fall Risk Score:  `1  Depression screen PHQ 2/9  Depression screen [facility] 2/9 05/10/2017 03/08/2017 02/03/2017 01/06/2017 12/07/2016 07/07/2016 12/03/2015  Decreased Interest 1 1 0 1 1 0 0  Down, Depressed, Hopeless 1 1 0 1 1 2 2   PHQ - 2 Score 2 2 0 2 2 2 2   Altered sleeping - - - - - - -  Tired, decreased energy - - - - - - -  Change in appetite - - - - - - -  Feeling bad or failure about yourself  - - - - - - -  Trouble concentrating - - - - - - -  Moving slowly or fidgety/restless - - - - - - -  Suicidal thoughts - - - - - - -  PHQ-9 Score - - - - - - -  Difficult doing work/chores - - - - - - -     Review of Systems  Constitutional: Positive for unexpected weight change.  HENT: Negative.   Eyes: Negative.   Respiratory: Negative.   Cardiovascular: Negative.   Gastrointestinal: Positive for abdominal pain, constipation, nausea and vomiting.  Endocrine: Negative.   Genitourinary: Negative.   Musculoskeletal: Positive for back pain and gait problem.       Spasms  Skin: Negative.   Allergic/Immunologic: Negative.   Neurological:       Negative   Hematological: Negative.   Psychiatric/Behavioral: Negative.   All other systems reviewed and are negative.      Objective:   Physical Exam  Constitutional: She is oriented to person, place, and time. She appears well-developed and well-nourished.  HENT:  Head: Normocephalic and atraumatic.  Neck: Normal range of motion. Neck supple.  Cervical Paraspinal Tenderness: C-5-C-6  Cardiovascular: Normal rate and regular rhythm.  Pulmonary/Chest: Effort normal and breath sounds normal.  Musculoskeletal:    Normal Muscle Bulk and Muscle Testing Reveals: Upper Extremities: Full ROM and Muscle Strength 5/5 Thoracic and Lumbar Hypersensitivity Left:Greater Trochanter Tenderness Lower Extremities: FullROM and Muscle Strength 5/5 Right Lower Extremity Flexion Produces Pain into Patella Arises from Table Slowly Using Straight Cane for Support Antalgic gait   Neurological: She is alert and oriented to person, place, and time.  Skin: Skin is warm and dry.  Psychiatric: She has a normal mood and affect.  Nursing note and vitals reviewed.         Assessment & Plan:  1. Acute Exacerbation  of Chronic Low Back Pain: RX Lumbar X-ray and RX: Medrol Dose Pak. 05/10/2017 2. 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 Status post decompressive laminectomy: Continue to Monitor 05/10/2017: Refilled:Oxycodone 10mg /325 mg one tablet five times a day as needed#140. We will continue the opioid monitoring program,this consists of regular clinic visits, examinations, urine drug screen,pill counts as well as use of [place] Controlled Substance Reporting System. 3. Insomnia/ Anxiety: Continue Xanax. 05/10/2017 4. Lumbar Radiculopathy/Neuropathic pain: Encouraged to take medication as prescribed, if no relief increase to QID. Instructed to call office on Friday 05/14/2017.  Gabapentin to 300mg  TID. 05/10/2017 5. Muscle Spasms. Continue: Tizanidine. 05/10/2017 6. Neurogenic bowel and bladder- Continue to monitor. 05/10/2017 7. Constipation/Opioid Therapy: Continue Linzess. 05/10/2017 8. Depression: Continue Celexa. 05/10/2017 9. Right Greater Trochanteric Bursitis: RX: Medrol Dose Pak: Continue Heat/Ice Therapy. 05/10/2017 10. Chronic Pain Syndrome: Continue Voltaren. 05/10/2017  20 minutes of face to face patient care time was spent during this visit. All questions were encouraged and answered.  F/U in 1 month

## 2017-05-10 NOTE — Patient Instructions (Signed)
Take your Gabapentin Three times a day as Prescribed , if you're still experiencing the Nerve pain on Wednesday.  Increase Gabapentin to four times a day  Call our office on Friday [phone]

## 2017-05-14 ENCOUNTER — Ambulatory Visit (HOSPITAL_COMMUNITY)
Admission: RE | Admit: 2017-05-14 | Discharge: 2017-05-14 | Disposition: A | Discharge status: Home / Self Care | Payer: No Typology Code available for payment source | Source: Ambulatory Visit | Attending: Registered Nurse | Admitting: Registered Nurse

## 2017-05-14 DIAGNOSIS — M47816 Spondylosis without myelopathy or radiculopathy, lumbar region: Secondary | ICD-10-CM | POA: Insufficient documentation

## 2017-05-14 DIAGNOSIS — M419 Scoliosis, unspecified: Secondary | ICD-10-CM | POA: Insufficient documentation

## 2017-05-14 DIAGNOSIS — M545 Low back pain: Secondary | ICD-10-CM | POA: Insufficient documentation

## 2017-05-14 DIAGNOSIS — G8929 Other chronic pain: Secondary | ICD-10-CM | POA: Insufficient documentation

## 2017-06-07 ENCOUNTER — Encounter: Payer: Self-pay | Admitting: Registered Nurse

## 2017-06-07 ENCOUNTER — Encounter
Payer: No Typology Code available for payment source | Attending: Physical Medicine and Rehabilitation | Admitting: Registered Nurse

## 2017-06-07 ENCOUNTER — Other Ambulatory Visit: Payer: Self-pay

## 2017-06-07 VITALS — BP 130/88 | HR 85

## 2017-06-07 DIAGNOSIS — Z5181 Encounter for therapeutic drug level monitoring: Secondary | ICD-10-CM

## 2017-06-07 DIAGNOSIS — M48061 Spinal stenosis, lumbar region without neurogenic claudication: Secondary | ICD-10-CM | POA: Insufficient documentation

## 2017-06-07 DIAGNOSIS — G834 Cauda equina syndrome: Secondary | ICD-10-CM

## 2017-06-07 DIAGNOSIS — F3289 Other specified depressive episodes: Secondary | ICD-10-CM

## 2017-06-07 DIAGNOSIS — M5416 Radiculopathy, lumbar region: Secondary | ICD-10-CM

## 2017-06-07 DIAGNOSIS — M62830 Muscle spasm of back: Secondary | ICD-10-CM

## 2017-06-07 DIAGNOSIS — Z79899 Other long term (current) drug therapy: Secondary | ICD-10-CM

## 2017-06-07 DIAGNOSIS — G8929 Other chronic pain: Secondary | ICD-10-CM

## 2017-06-07 DIAGNOSIS — M5126 Other intervertebral disc displacement, lumbar region: Secondary | ICD-10-CM

## 2017-06-07 DIAGNOSIS — F411 Generalized anxiety disorder: Secondary | ICD-10-CM

## 2017-06-07 DIAGNOSIS — G894 Chronic pain syndrome: Secondary | ICD-10-CM

## 2017-06-07 DIAGNOSIS — M25561 Pain in right knee: Secondary | ICD-10-CM

## 2017-06-07 DIAGNOSIS — M7062 Trochanteric bursitis, left hip: Secondary | ICD-10-CM

## 2017-06-07 DIAGNOSIS — M7061 Trochanteric bursitis, right hip: Secondary | ICD-10-CM

## 2017-06-07 DIAGNOSIS — M961 Postlaminectomy syndrome, not elsewhere classified: Secondary | ICD-10-CM

## 2017-06-07 MED ORDER — OXYCODONE-ACETAMINOPHEN 10-325 MG PO TABS: ORAL_TABLET | ORAL | 0 refills | Status: DC

## 2017-06-07 MED ORDER — DICLOFENAC SODIUM 75 MG PO TBEC: 75.0000 mg | DELAYED_RELEASE_TABLET | Freq: Two times a day (BID) | ORAL | 3 refills | Status: DC

## 2017-06-07 MED ORDER — GABAPENTIN 300 MG PO CAPS: ORAL_CAPSULE | ORAL | 3 refills | Status: DC

## 2017-06-07 MED ORDER — TIZANIDINE HCL 4 MG PO TABS: ORAL_TABLET | ORAL | 3 refills | Status: DC

## 2017-06-07 MED ORDER — CITALOPRAM HYDROBROMIDE 10 MG PO TABS: 10.0000 mg | ORAL_TABLET | Freq: Every day | ORAL | 3 refills | Status: DC

## 2017-06-07 MED FILL — DICLOFENAC SODIUM 75 MG TAB: 75 | 30 days supply | Qty: 60 | Fill #0

## 2017-06-07 MED FILL — tiZANidine HCL 4 MG TABS: 4 | 30 days supply | Qty: 180 | Fill #0

## 2017-06-07 MED FILL — CITALOPRAM HBR 10 MG TABLET: 10 | 30 days supply | Qty: 30 | Fill #0

## 2017-06-07 MED FILL — GABAPENTIN 300 MG CAPSULE: 300 | 30 days supply | Qty: 150 | Fill #0

## 2017-06-07 NOTE — Progress Notes (Signed)
Subjective:    Patient ID: [name], female    DOB: 1971-01-30, 47 y.o.   MRN: [id number]  HPI:  [name] is a 47year old female who returns for follow up appointmentfor chronic pain and medication refill. She states her pain is located in her lower back radiating into her right lower extremity,, Left hip pain and right knee pain. Also reports the increase in gabapentin has provided relief with her neuropathic painShe rates her pain 9. Her current  exercise regime is walking, she will be resuming swimming she reports.   [name] Morphine equivalent is 87.50 MME. She is also prescribed Alprazolam. We have reviewed the black box warning again regarding using opioids and benzodiazepines. I highlighted the dangers of using these drugs together and discussed the adverse events including respiratory suppression, overdose, cognitive impairment and importance of  compliance with current regimen. She verbalizes understanding, we will continue to monitor and adjust as indicated.     Oral Swab was Performed on 04/12/2017 it was consistent.   Pain Inventory Average Pain 9 Pain Right Now 9 My pain is burning, dull, stabbing and aching  In the last 24 hours, has pain interfered with the following? General activity 8 Relation with others 8 Enjoyment of life 9 What TIME of day is your pain at its worst? all Sleep (in general) Poor  Pain is worse with: walking, sitting, standing and some activites Pain improves with: medication Relief from Meds: 5  Mobility use a cane ability to climb steps?  no do you drive?  no  Function not employed: date last employed . I need assistance with the following:  .  Neuro/Psych No problems in this area  Prior Studies Any changes since last visit?  no  Physicians involved in your care Any changes since last visit?  no   Family History  Problem Relation Age of Onset  . Hypertension Maternal Grandmother   . Anesthesia problems Neg  Hx    Social History   Socioeconomic History  . Marital status: Divorced    Spouse name: Not on file  . Number of children: Not on file  . Years of education: Not on file  . Highest education level: Not on file  Social Needs  . Financial resource strain: Not on file  . Food insecurity - worry: Not on file  . Food insecurity - inability: Not on file  . Transportation needs - medical: Not on file  . Transportation needs - non-medical: Not on file  Occupational History  . Not on file  Tobacco Use  . Smoking status: Never Smoker  . Smokeless tobacco: Never Used  Substance and Sexual Activity  . Alcohol use: No  . Drug use: No  . Sexual activity: Not Currently    Birth control/protection: None    Comment: IUD removed 2 wks ago.  Other Topics Concern  . Not on file  Social History Narrative  . Not on file   Past Surgical History:  Procedure Laterality Date  . ABDOMINAL HYSTERECTOMY  06/29/11   Robotic Assisted Hysterectomy  . CERVICAL SPINE SURGERY  [place]  . LUMBAR LAMINECTOMY  08/31/2011   Procedure: MICRODISCECTOMY LUMBAR LAMINECTOMY;  Surgeon: Tobi Bastos, MD;  Location: [facility];  Service: Orthopedics;  Laterality: N/A;  lumbar five sacral one central microdisectomy  . MYOMECTOMY  13  . NECK SURGERY    . POSTERIOR CERVICAL FUSION/FORAMINOTOMY N/A 02/02/2013   Procedure: POSTERIOR  C5-6 SPINAL FUSION;  Surgeon: Melina Schools, MD;  Location: [place];  Service: Orthopedics;  Laterality: N/A;  . POSTERIOR FUSION CERVICAL SPINE  02/02/2013   C 5 C6     [name]  . WISDOM TOOTH EXTRACTION     Past Medical History:  Diagnosis Date  . Anemia    hx  . Arthritis   . Complication of anesthesia    slow to wake up,   very difficult to get iv had picc line last time  . Difficult intravenous access    hands are usually best for blood draws and IV starts  . DVT (deep venous thrombosis) ([place])    ? vs lupus  . Fibroids   . Headache(784.0)   . Neck pain     . Scoliosis    BP 130/88   Pulse 85   LMP 04/01/2011   SpO2 96%   Opioid Risk Score:  0 Fall Risk Score:  `1  Depression screen PHQ 2/9  Depression screen [facility] 2/9 06/07/2017 05/10/2017 03/08/2017 02/03/2017 01/06/2017 12/07/2016 07/07/2016  Decreased Interest 1 1 1  0 1 1 0  Down, Depressed, Hopeless 1 1 1  0 1 1 2   PHQ - 2 Score 2 2 2  0 2 2 2   Altered sleeping - - - - - - -  Tired, decreased energy - - - - - - -  Change in appetite - - - - - - -  Feeling bad or failure about yourself  - - - - - - -  Trouble concentrating - - - - - - -  Moving slowly or fidgety/restless - - - - - - -  Suicidal thoughts - - - - - - -  PHQ-9 Score - - - - - - -  Difficult doing work/chores - - - - - - -     Review of Systems  Constitutional: Negative.   HENT: Negative.   Eyes: Negative.   Respiratory: Negative.   Cardiovascular: Negative.   Gastrointestinal: Negative.   Endocrine: Negative.   Genitourinary: Negative.   Musculoskeletal: Positive for back pain and gait problem.       Spasms  Skin: Negative.   Allergic/Immunologic: Negative.   Neurological:       Negative   Hematological: Negative.   Psychiatric/Behavioral: Negative.   All other systems reviewed and are negative.      Objective:   Physical Exam  Constitutional: She is oriented to person, place, and time. She appears well-developed and well-nourished.  HENT:  Head: Normocephalic and atraumatic.  Neck: Normal range of motion. Neck supple.  Cervical Paraspinal Tenderness: C-5-C-6  Cardiovascular: Normal rate and regular rhythm.  Pulmonary/Chest: Effort normal and breath sounds normal.  Musculoskeletal:  Normal Muscle Bulk and Muscle Testing Reveals: Upper Extremities: Full  ROM and Muscle Strength 5/5  Lumbar Hypersensitivity Left Greater Trochanter Tenderness Lower Extremities:Left Full ROM and Muscle Strength 5/5 Right: Decreased ROM and Muscle Strength 4/5 Right Lower Extremity Flexion Produces Pain into  Patella Arises from Table Slowly Using Straight Cane for Support Antalgic gait   Neurological: She is alert and oriented to person, place, and time.  Skin: Skin is warm and dry.  Psychiatric: She has a normal mood and affect.  Nursing note and vitals reviewed.         Assessment & Plan:  1. 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 Status post decompressive laminectomy: Continue to Monitor 06/07/2017: Refilled:Oxycodone 10mg /325 mg one tablet five times a day as  needed#140. We will continue the opioid monitoring program,this consists of regular clinic visits, examinations, urine drug screen,pill counts as well as use of [place] Controlled Substance Reporting System. 2. Insomnia/ Anxiety: Continue Xanax. 06/07/2017 3. Lumbar Radiculopathy/Neuropathic pain:Increased  Gabapentin to 300mg  at breakfast, lunch, supper and two capsules at bedtime. 06/07/2017 4. Muscle Spasms. Continue: Tizanidine. 06/07/2017 5. Neurogenic bowel and bladder- Continue to monitor. 06/07/2017 6. Constipation/Opioid Therapy: Continue Linzess. 06/07/2017 7. Depression: Continue Celexa. 06/07/2017 8. Left Greater Trochanteric Bursitis:  Continue Heat/Ice Therapy. 05/10/2017 9. Chronic Pain Syndrome: Continue Voltaren. 06/07/2017  20 minutes of face to face patient care time was spent during this visit. All questions were encouraged and answered.  F/U in 1 month

## 2017-07-06 ENCOUNTER — Encounter: Payer: Self-pay | Admitting: Registered Nurse

## 2017-07-06 ENCOUNTER — Encounter
Payer: No Typology Code available for payment source | Attending: Physical Medicine and Rehabilitation | Admitting: Registered Nurse

## 2017-07-06 VITALS — BP 134/87 | HR 90 | Ht 65.0 in | Wt 249.6 lb

## 2017-07-06 DIAGNOSIS — M48061 Spinal stenosis, lumbar region without neurogenic claudication: Secondary | ICD-10-CM | POA: Insufficient documentation

## 2017-07-06 DIAGNOSIS — F411 Generalized anxiety disorder: Secondary | ICD-10-CM

## 2017-07-06 DIAGNOSIS — M62830 Muscle spasm of back: Secondary | ICD-10-CM

## 2017-07-06 DIAGNOSIS — M5126 Other intervertebral disc displacement, lumbar region: Secondary | ICD-10-CM | POA: Insufficient documentation

## 2017-07-06 DIAGNOSIS — Z79899 Other long term (current) drug therapy: Secondary | ICD-10-CM

## 2017-07-06 DIAGNOSIS — G894 Chronic pain syndrome: Secondary | ICD-10-CM

## 2017-07-06 DIAGNOSIS — G834 Cauda equina syndrome: Secondary | ICD-10-CM

## 2017-07-06 DIAGNOSIS — M961 Postlaminectomy syndrome, not elsewhere classified: Secondary | ICD-10-CM

## 2017-07-06 DIAGNOSIS — F3289 Other specified depressive episodes: Secondary | ICD-10-CM

## 2017-07-06 DIAGNOSIS — M7061 Trochanteric bursitis, right hip: Secondary | ICD-10-CM

## 2017-07-06 DIAGNOSIS — M25561 Pain in right knee: Secondary | ICD-10-CM

## 2017-07-06 DIAGNOSIS — Z5181 Encounter for therapeutic drug level monitoring: Secondary | ICD-10-CM

## 2017-07-06 DIAGNOSIS — G8929 Other chronic pain: Secondary | ICD-10-CM

## 2017-07-06 DIAGNOSIS — M5416 Radiculopathy, lumbar region: Secondary | ICD-10-CM

## 2017-07-06 DIAGNOSIS — M7062 Trochanteric bursitis, left hip: Secondary | ICD-10-CM

## 2017-07-06 MED ORDER — OXYCODONE-ACETAMINOPHEN 10-325 MG PO TABS
ORAL_TABLET | ORAL | 0 refills | Status: DC
Start: 2017-07-06 — End: 2017-08-03

## 2017-07-06 NOTE — Progress Notes (Signed)
Subjective:    Patient ID: [name], female    DOB: November 02, 1970, 47 y.o.   MRN: [id number]  HPI: [name] is a 47 year old female who returns for follow up appointment for chronic pain and medication refill. She states her pain is located in her lower back radiating into her left hip. She rates her pain 9. Her current exercise regime is walking.   [name] Morphine Equivalent is 72.50 MME. She  is also prescribed Alprazolam. We have discussed the black box warning of using opioids and benzodiazepines. I highlighted the dangers of using these drugs together and discussed the adverse events including respiratory suppression, overdose, cognitive impairment and importance of  compliance with current regimen. She verbalizes understanding, we will continue to monitor and adjust as indicated.   Oral Swab was Performed on 04/12/2017, it was consistent.     Pain Inventory Average Pain 9 Pain Right Now 9 My pain is sharp, dull, tingling and aching  In the last 24 hours, has pain interfered with the following? General activity 8 Relation with others 9 Enjoyment of life 9 What TIME of day is your pain at its worst? all Sleep (in general) Poor  Pain is worse with: walking, bending, sitting and standing Pain improves with: medication Relief from Meds: 2  Mobility use a cane ability to climb steps?  no do you drive?  no  Function not employed: date last employed .  Neuro/Psych spasms depression anxiety  Prior Studies Any changes since last visit?  no  Physicians involved in your care Any changes since last visit?  no   Family History  Problem Relation Age of Onset  . Hypertension Maternal Grandmother   . Anesthesia problems Neg Hx    Social History   Socioeconomic History  . Marital status: Divorced    Spouse name: Not on file  . Number of children: Not on file  . Years of education: Not on file  . Highest education level: Not on file  Occupational  History  . Not on file  Social Needs  . Financial resource strain: Not on file  . Food insecurity:    Worry: Not on file    Inability: Not on file  . Transportation needs:    Medical: Not on file    Non-medical: Not on file  Tobacco Use  . Smoking status: Never Smoker  . Smokeless tobacco: Never Used  Substance and Sexual Activity  . Alcohol use: No  . Drug use: No  . Sexual activity: Not Currently    Birth control/protection: None    Comment: IUD removed 2 wks ago.  Lifestyle  . Physical activity:    Days per week: Not on file    Minutes per session: Not on file  . Stress: Not on file  Relationships  . Social connections:    Talks on phone: Not on file    Gets together: Not on file    Attends religious service: Not on file    Active member of club or organization: Not on file    Attends meetings of clubs or organizations: Not on file    Relationship status: Not on file  Other Topics Concern  . Not on file  Social History Narrative  . Not on file   Past Surgical History:  Procedure Laterality Date  . ABDOMINAL HYSTERECTOMY  06/29/11   Robotic Assisted Hysterectomy  . CERVICAL SPINE SURGERY  [place]  .  LUMBAR LAMINECTOMY  08/31/2011   Procedure: MICRODISCECTOMY LUMBAR LAMINECTOMY;  Surgeon: Tobi Bastos, MD;  Location: [facility];  Service: Orthopedics;  Laterality: N/A;  lumbar five sacral one central microdisectomy  . MYOMECTOMY  13  . NECK SURGERY    . POSTERIOR CERVICAL FUSION/FORAMINOTOMY N/A 02/02/2013   Procedure: POSTERIOR C5-6 SPINAL FUSION;  Surgeon: Melina Schools, MD;  Location: [place];  Service: Orthopedics;  Laterality: N/A;  . POSTERIOR FUSION CERVICAL SPINE  02/02/2013   C 5 C6     [name]  . WISDOM TOOTH EXTRACTION     Past Medical History:  Diagnosis Date  . Anemia    hx  . Arthritis   . Complication of anesthesia    slow to wake up,   very difficult to get iv had picc line last time  . Difficult intravenous access     hands are usually best for blood draws and IV starts  . DVT (deep venous thrombosis) ([place])    ? vs lupus  . Fibroids   . Headache(784.0)   . Neck pain   . Scoliosis    BP 134/87   Pulse 90   Ht 5\' 5"  (1.651 m)   Wt 249 lb 9.6 oz (113.2 kg)   LMP 04/01/2011   SpO2 97%   BMI 41.54 kg/m   Opioid Risk Score:   Fall Risk Score:  `1  Depression screen PHQ 2/9  Depression screen [facility] 2/9 06/07/2017 05/10/2017 03/08/2017 02/03/2017 01/06/2017 12/07/2016 07/07/2016  Decreased Interest 1 1 1  0 1 1 0  Down, Depressed, Hopeless 1 1 1  0 1 1 2   PHQ - 2 Score 2 2 2  0 2 2 2   Altered sleeping - - - - - - -  Tired, decreased energy - - - - - - -  Change in appetite - - - - - - -  Feeling bad or failure about yourself  - - - - - - -  Trouble concentrating - - - - - - -  Moving slowly or fidgety/restless - - - - - - -  Suicidal thoughts - - - - - - -  PHQ-9 Score - - - - - - -  Difficult doing work/chores - - - - - - -     Review of Systems  Constitutional: Positive for diaphoresis and unexpected weight change.  HENT: Negative.   Eyes: Negative.   Respiratory: Positive for cough.   Cardiovascular: Positive for leg swelling.  Gastrointestinal: Positive for abdominal pain and constipation.  Endocrine: Negative.   Genitourinary: Negative.   Musculoskeletal: Positive for arthralgias, back pain, joint swelling and myalgias.  Skin: Negative.   Allergic/Immunologic: Negative.   Neurological: Negative.   Hematological: Negative.   Psychiatric/Behavioral: Positive for dysphoric mood. The patient is nervous/anxious.   All other systems reviewed and are negative.      Objective:   Physical Exam  Constitutional: She is oriented to person, place, and time. She appears well-developed and well-nourished.  HENT:  Head: Normocephalic and atraumatic.  Neck: Normal range of motion. Neck supple.  Cardiovascular: Normal rate and regular rhythm.  Pulmonary/Chest: Effort normal and breath sounds  normal.  Musculoskeletal:  Normal Muscle Bulk and Muscle Testing Reveals: Upper Extremities: Full ROM and Muscle Strength 5/5 Lumbar Paraspinal Tenderness: L-3-L-5 Left Greater Trochanter Tenderness Lower Extremities: Full ROM and Muscle Strength 5/5 Right Lower Extremity Flexion Produces Pain into Patella Arises from Table Slowly using straight cane for support Antalgic Gait   Neurological:  She is alert and oriented to person, place, and time.  Skin: Skin is warm and dry.  Psychiatric: She has a normal mood and affect.  Nursing note and vitals reviewed.         Assessment & Plan:  1. 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 Status post decompressive laminectomy: Continue to Monitor 07/06/2017: Continue current medication regimen Refilled:Oxycodone 10mg /325 mg one tablet five times a day as needed#140. We will continue the opioid monitoring program,this consists of regular clinic visits, examinations, urine drug screen,pill counts as well as use of [place] Controlled Substance Reporting System. 2. Insomnia/ Anxiety: Continue current medication regimen with Xanax. 07/06/2017 3. Lumbar Radiculopathy/Neuropathic pain: Continue current medication with  Gabapentin at breakfast, lunch, supper and two capsules at bedtime. 07/06/2017 4. Muscle Spasms. Continue: current medication regimen with Tizanidine. 07/06/2017 5. Neurogenic bowel and bladder- Continue to monitor. 07/06/2017 6. Constipation/Opioid Therapy: Continue Linzess. 07/06/2017 7. Depression: Continue current medication with Celexa. 07/06/2017 8. Left Greater Trochanteric Bursitis:  Continue Heat/Ice Therapy. 07/06/2017 9. Chronic Pain Syndrome: Continue Voltaren. 07/06/2017  20 minutes of face to face patient care time was spent during this visit. All questions were encouraged and answered.  F/U in 1 month

## 2017-08-03 ENCOUNTER — Encounter: Payer: Self-pay | Admitting: Registered Nurse

## 2017-08-03 ENCOUNTER — Encounter
Payer: No Typology Code available for payment source | Attending: Physical Medicine and Rehabilitation | Admitting: Registered Nurse

## 2017-08-03 VITALS — BP 117/81 | HR 89 | Ht 65.0 in | Wt 252.0 lb

## 2017-08-03 DIAGNOSIS — F411 Generalized anxiety disorder: Secondary | ICD-10-CM

## 2017-08-03 DIAGNOSIS — Z79899 Other long term (current) drug therapy: Secondary | ICD-10-CM

## 2017-08-03 DIAGNOSIS — M48061 Spinal stenosis, lumbar region without neurogenic claudication: Secondary | ICD-10-CM | POA: Insufficient documentation

## 2017-08-03 DIAGNOSIS — G8929 Other chronic pain: Secondary | ICD-10-CM

## 2017-08-03 DIAGNOSIS — M62830 Muscle spasm of back: Secondary | ICD-10-CM

## 2017-08-03 DIAGNOSIS — M7061 Trochanteric bursitis, right hip: Secondary | ICD-10-CM

## 2017-08-03 DIAGNOSIS — M961 Postlaminectomy syndrome, not elsewhere classified: Secondary | ICD-10-CM

## 2017-08-03 DIAGNOSIS — Z5181 Encounter for therapeutic drug level monitoring: Secondary | ICD-10-CM

## 2017-08-03 DIAGNOSIS — F5101 Primary insomnia: Secondary | ICD-10-CM

## 2017-08-03 DIAGNOSIS — F3289 Other specified depressive episodes: Secondary | ICD-10-CM

## 2017-08-03 DIAGNOSIS — M25561 Pain in right knee: Secondary | ICD-10-CM

## 2017-08-03 DIAGNOSIS — G894 Chronic pain syndrome: Secondary | ICD-10-CM

## 2017-08-03 DIAGNOSIS — M7062 Trochanteric bursitis, left hip: Secondary | ICD-10-CM

## 2017-08-03 DIAGNOSIS — M5126 Other intervertebral disc displacement, lumbar region: Secondary | ICD-10-CM | POA: Insufficient documentation

## 2017-08-03 DIAGNOSIS — G834 Cauda equina syndrome: Secondary | ICD-10-CM

## 2017-08-03 MED ORDER — OXYCODONE-ACETAMINOPHEN 10-325 MG PO TABS
ORAL_TABLET | ORAL | 0 refills | Status: DC
Start: 2017-08-03 — End: 2017-09-07

## 2017-08-03 MED ORDER — ALPRAZOLAM 0.25 MG PO TABS: 0.2500 mg | ORAL_TABLET | Freq: Every day | ORAL | 2 refills | Status: DC

## 2017-08-03 NOTE — Progress Notes (Signed)
Subjective:    Patient ID: [name], female    DOB: 1970-09-12, 47 y.o.   MRN: [id number]  HPI: [name] is a 47 year old female who returns for follow up appointment for chronic pain and medication refill. She states her pain is located in her neck, lower back and left hip. She rates her pain 9. Her current exercise regime is walking.   Ms. Muckleroy Morphine Equivalent is 72.50 MME. She is also prescribed Alprazolam. We have discussed the black box warning of using opioids and benzodiazepines. I highlighted the dangers of using these drugs together and discussed the adverse events including respiratory suppression, overdose, cognitive impairment and importance of compliance with current regimen. We will continue to monitor and adjust as indicated.   Last Oral Swab was Performed on 04/12/2017, it was consistent.   Pain Inventory Average Pain 9 Pain Right Now 9 My pain is sharp, dull, stabbing, tingling and aching  In the last 24 hours, has pain interfered with the following? General activity 9 Relation with others 10 Enjoyment of life 9 What TIME of day is your pain at its worst? all Sleep (in general) Poor  Pain is worse with: walking, bending, sitting, standing and some activites Pain improves with: medication Relief from Meds: 4  Mobility use a cane ability to climb steps?  no  Function not employed: date last employed . I need assistance with the following:  meal prep, household duties and shopping  Neuro/Psych No problems in this area  Prior Studies Any changes since last visit?  no  Physicians involved in your care Any changes since last visit?  no   Family History  Problem Relation Age of Onset  . Hypertension Maternal Grandmother   . Anesthesia problems Neg Hx    Social History   Socioeconomic History  . Marital status: Divorced    Spouse name: Not on file  . Number of children: Not on file  . Years of education: Not on file  .  Highest education level: Not on file  Occupational History  . Not on file  Social Needs  . Financial resource strain: Not on file  . Food insecurity:    Worry: Not on file    Inability: Not on file  . Transportation needs:    Medical: Not on file    Non-medical: Not on file  Tobacco Use  . Smoking status: Never Smoker  . Smokeless tobacco: Never Used  Substance and Sexual Activity  . Alcohol use: No  . Drug use: No  . Sexual activity: Not Currently    Birth control/protection: None    Comment: IUD removed 2 wks ago.  Lifestyle  . Physical activity:    Days per week: Not on file    Minutes per session: Not on file  . Stress: Not on file  Relationships  . Social connections:    Talks on phone: Not on file    Gets together: Not on file    Attends religious service: Not on file    Active member of club or organization: Not on file    Attends meetings of clubs or organizations: Not on file    Relationship status: Not on file  Other Topics Concern  . Not on file  Social History Narrative  . Not on file   Past Surgical History:  Procedure Laterality Date  . ABDOMINAL HYSTERECTOMY  06/29/11   Robotic Assisted Hysterectomy  . CERVICAL SPINE SURGERY  10  .  CESAREAN SECTION  1996  . LUMBAR LAMINECTOMY  08/31/2011   Procedure: MICRODISCECTOMY LUMBAR LAMINECTOMY;  Surgeon: Tobi Bastos, MD;  Location[facility];  Service: Orthopedics;  Laterality: N/A;  lumbar five sacral one central microdisectomy  . MYOMECTOMY  13  . NECK SURGERY    . POSTERIOR CERVICAL FUSION/FORAMINOTOMY N/A 02/02/2013   Procedure: POSTERIOR C5-6 SPINAL FUSION;  Surgeon: Melina Schools, MD;  Location: [place];  Service: Orthopedics;  Laterality: N/A;  . POSTERIOR FUSION CERVICAL SPINE  02/02/2013   C 5 C6     [name]  . WISDOM TOOTH EXTRACTION     Past Medical History:  Diagnosis Date  . Anemia    hx  . Arthritis   . Complication of anesthesia    slow to wake up,   very difficult to get iv had picc  line last time  . Difficult intravenous access    hands are usually best for blood draws and IV starts  . DVT (deep venous thrombosis) ([place])    ? vs lupus  . Fibroids   . Headache(784.0)   . Neck pain   . Scoliosis    LMP 04/01/2011   Opioid Risk Score:   Fall Risk Score:  `1  Depression screen PHQ 2/9  Depression screen [facility] 2/9 06/07/2017 05/10/2017 03/08/2017 02/03/2017 01/06/2017 12/07/2016 07/07/2016  Decreased Interest 1 1 1  0 1 1 0  Down, Depressed, Hopeless 1 1 1  0 1 1 2   PHQ - 2 Score 2 2 2  0 2 2 2   Altered sleeping - - - - - - -  Tired, decreased energy - - - - - - -  Change in appetite - - - - - - -  Feeling bad or failure about yourself  - - - - - - -  Trouble concentrating - - - - - - -  Moving slowly or fidgety/restless - - - - - - -  Suicidal thoughts - - - - - - -  PHQ-9 Score - - - - - - -  Difficult doing work/chores - - - - - - -     Review of Systems  Constitutional: Positive for diaphoresis and unexpected weight change.  HENT: Negative.   Eyes: Negative.   Respiratory: Negative.   Cardiovascular: Negative.   Gastrointestinal: Positive for abdominal pain.  Endocrine: Negative.   Genitourinary: Negative.   Musculoskeletal: Positive for arthralgias, back pain, gait problem, joint swelling and myalgias.  Skin: Negative.   Allergic/Immunologic: Negative.   Hematological: Negative.   Psychiatric/Behavioral: Negative.   All other systems reviewed and are negative.      Objective:   Physical Exam  Constitutional: She is oriented to person, place, and time. She appears well-developed and well-nourished.  HENT:  Head: Normocephalic and atraumatic.  Neck: Normal range of motion. Neck supple.  Cervical Paraspinal Tenderness: C-5-C-6  Cardiovascular: Normal rate and regular rhythm.  Pulmonary/Chest: Effort normal and breath sounds normal.  Musculoskeletal:  Normal Muscle Bulk and Muscle Testing Reveals: Upper Extremities: Full ROM and Muscle Strength  5/5 Bilateral AC Joint Tenderness Thoracic Paraspinal Tenderness: T-1-T-3 Lumbar Hypersensitivity Lower Extremities: Full ROM and Muscle Strength 5/5 Arises from Table slowly Antalgic gait  Neurological: She is alert and oriented to person, place, and time.  Skin: Skin is warm and dry.  Psychiatric: She has a normal mood and affect.  Nursing note and vitals reviewed.         Assessment & Plan:  1. Lumbar Post-Laminectomy/Cauda equina syndrome  due to extruded L5-S1 disc. Status post decompressive laminectomy: Continue to Monitor 08/03/2017: Refilled:Oxycodone 10mg /325 mg one tablet five times a day as needed#140. We will continue the opioid monitoring program,this consists of regular clinic visits, examinations, urine drug screen,pill counts as well as use of [place] Controlled Substance Reporting System. 2. Insomnia/ Anxiety: Continue current medication regimen with  Xanax. 08/03/2017 3. Lumbar Radiculopathy/Neuropathic pain: Continue current medication regimen with Gabapentin to 300mg  TID. 08/03/2017. 4. Muscle Spasms. Continue current medication regimen with: Tizanidine. 08/03/2017 5. Neurogenic bowel and bladder- Continue to monitor. 08/03/2017 6. Constipation/Opioid Therapy: Continue current medication regimen with Linzess. 08/03/2017 7. Depression: Continue current medication regimen with Celexa. 08/03/2017 8. Left Greater Trochanteric Bursitis: Continue Heat/Ice Therapy. 08/03/2017 9. Chronic Pain Syndrome: Continue current medication regimen with Voltaren. 08/03/2017  20 minutes of face to face patient care time was spent during this visit. All questions were encouraged and answered.  F/U in 1 month

## 2017-09-07 ENCOUNTER — Encounter
Payer: No Typology Code available for payment source | Attending: Physical Medicine and Rehabilitation | Admitting: Registered Nurse

## 2017-09-07 ENCOUNTER — Encounter: Payer: Self-pay | Admitting: Registered Nurse

## 2017-09-07 ENCOUNTER — Other Ambulatory Visit: Payer: Self-pay

## 2017-09-07 VITALS — BP 134/82 | HR 88 | Ht 65.0 in | Wt 256.4 lb

## 2017-09-07 DIAGNOSIS — Z79899 Other long term (current) drug therapy: Secondary | ICD-10-CM

## 2017-09-07 DIAGNOSIS — F411 Generalized anxiety disorder: Secondary | ICD-10-CM

## 2017-09-07 DIAGNOSIS — F5101 Primary insomnia: Secondary | ICD-10-CM

## 2017-09-07 DIAGNOSIS — M48061 Spinal stenosis, lumbar region without neurogenic claudication: Secondary | ICD-10-CM | POA: Insufficient documentation

## 2017-09-07 DIAGNOSIS — M62830 Muscle spasm of back: Secondary | ICD-10-CM

## 2017-09-07 DIAGNOSIS — M7061 Trochanteric bursitis, right hip: Secondary | ICD-10-CM

## 2017-09-07 DIAGNOSIS — G8929 Other chronic pain: Secondary | ICD-10-CM

## 2017-09-07 DIAGNOSIS — F3289 Other specified depressive episodes: Secondary | ICD-10-CM

## 2017-09-07 DIAGNOSIS — M7062 Trochanteric bursitis, left hip: Secondary | ICD-10-CM

## 2017-09-07 DIAGNOSIS — M5126 Other intervertebral disc displacement, lumbar region: Secondary | ICD-10-CM | POA: Insufficient documentation

## 2017-09-07 DIAGNOSIS — M25561 Pain in right knee: Secondary | ICD-10-CM

## 2017-09-07 DIAGNOSIS — G894 Chronic pain syndrome: Secondary | ICD-10-CM

## 2017-09-07 DIAGNOSIS — G834 Cauda equina syndrome: Secondary | ICD-10-CM | POA: Insufficient documentation

## 2017-09-07 DIAGNOSIS — M961 Postlaminectomy syndrome, not elsewhere classified: Secondary | ICD-10-CM

## 2017-09-07 DIAGNOSIS — Z5181 Encounter for therapeutic drug level monitoring: Secondary | ICD-10-CM

## 2017-09-07 MED ORDER — DICLOFENAC SODIUM 75 MG PO TBEC: 75.0000 mg | DELAYED_RELEASE_TABLET | Freq: Two times a day (BID) | ORAL | 3 refills | Status: DC

## 2017-09-07 MED ORDER — OXYCODONE-ACETAMINOPHEN 10-325 MG PO TABS: ORAL_TABLET | ORAL | 0 refills | Status: DC

## 2017-09-07 MED ORDER — TIZANIDINE HCL 4 MG PO TABS: ORAL_TABLET | ORAL | 3 refills | Status: DC

## 2017-09-07 MED ORDER — CITALOPRAM HYDROBROMIDE 10 MG PO TABS
10.0000 mg | ORAL_TABLET | Freq: Every day | ORAL | 3 refills | Status: DC
Start: 2017-09-07 — End: 2017-12-06

## 2017-09-07 MED ORDER — GABAPENTIN 300 MG PO CAPS: ORAL_CAPSULE | ORAL | 3 refills | Status: DC

## 2017-09-07 NOTE — Progress Notes (Signed)
Subjective:    Patient ID: [name], female    DOB: Jan 16, 1971, 47 y.o.   MRN: [id number]  HPI: [name] is a 47 year old female who returns for follow up apppointment for chronic pain and medication refill. She states her pain is located in her lower back radiating into her left hip and right knee pain. She rates her pain 10. Her current exercise regime is walking and pool therapy at [facility] three times a week.   [name] Morphine Equivalent is 75.00 MME. She is also prescribed Alprazolam.We have discussed the black box warning of using opioids and benzodiazepines. I highlighted the dangers of using these drugs together and discussed the adverse events including respiratory suppression, overdose, cognitive impairment and importance of compliance with current regimen. We will continue to monitor and adjust as indicated.   Last Oral Swab was Performed on 04/12/2017, it was consistent.   Pain Inventory Average Pain 9 Pain Right Now 10 My pain is sharp, dull, stabbing, tingling and aching  In the last 24 hours, has pain interfered with the following? General activity 9 Relation with others 9 Enjoyment of life 10 What TIME of day is your pain at its worst? all Sleep (in general) Poor  Pain is worse with: walking, bending, sitting and standing Pain improves with: medication Relief from Meds: 5  Mobility use a cane do you drive?  no  Function not employed: date last employed n/a  Neuro/Psych numbness trouble walking spasms  Prior Studies Any changes since last visit?  no  Physicians involved in your care Any changes since last visit?  no   Family History  Problem Relation Age of Onset  . Hypertension Maternal Grandmother   . Anesthesia problems Neg Hx    Social History   Socioeconomic History  . Marital status: Divorced    Spouse name: Not on file  . Number of children: Not on file  . Years of education: Not on file  . Highest  education level: Not on file  Occupational History  . Not on file  Social Needs  . Financial resource strain: Not on file  . Food insecurity:    Worry: Not on file    Inability: Not on file  . Transportation needs:    Medical: Not on file    Non-medical: Not on file  Tobacco Use  . Smoking status: Never Smoker  . Smokeless tobacco: Never Used  Substance and Sexual Activity  . Alcohol use: No  . Drug use: No  . Sexual activity: Not Currently    Birth control/protection: None    Comment: IUD removed 2 wks ago.  Lifestyle  . Physical activity:    Days per week: Not on file    Minutes per session: Not on file  . Stress: Not on file  Relationships  . Social connections:    Talks on phone: Not on file    Gets together: Not on file    Attends religious service: Not on file    Active member of club or organization: Not on file    Attends meetings of clubs or organizations: Not on file    Relationship status: Not on file  Other Topics Concern  . Not on file  Social History Narrative  . Not on file   Past Surgical History:  Procedure Laterality Date  . ABDOMINAL HYSTERECTOMY  06/29/11   Robotic Assisted Hysterectomy  . CERVICAL SPINE SURGERY  10  . CESAREAN  SECTION  1996  . LUMBAR LAMINECTOMY  08/31/2011   Procedure: MICRODISCECTOMY LUMBAR LAMINECTOMY;  Surgeon: Tobi Bastos, MD;  Location: [facility];  Service: Orthopedics;  Laterality: N/A;  lumbar five sacral one central microdisectomy  . MYOMECTOMY  13  . NECK SURGERY    . POSTERIOR CERVICAL FUSION/FORAMINOTOMY N/A 02/02/2013   Procedure: POSTERIOR C5-6 SPINAL FUSION;  Surgeon: Melina Schools, MD;  Location: [place];  Service: Orthopedics;  Laterality: N/A;  . POSTERIOR FUSION CERVICAL SPINE  02/02/2013   C 5 C6     [name]  . WISDOM TOOTH EXTRACTION     Past Medical History:  Diagnosis Date  . Anemia    hx  . Arthritis   . Complication of anesthesia    slow to wake up,   very difficult to get iv had picc line last  time  . Difficult intravenous access    hands are usually best for blood draws and IV starts  . DVT (deep venous thrombosis) ([place])    ? vs lupus  . Fibroids   . Headache(784.0)   . Neck pain   . Scoliosis    BP 134/82   Pulse 88   Ht 5\' 5"  (1.651 m)   Wt 256 lb 6.4 oz (116.3 kg)   LMP 04/01/2011   SpO2 98%   BMI 42.67 kg/m   Opioid Risk Score:   Fall Risk Score:  `1  Depression screen PHQ 2/9  Depression screen [facility] 2/9 09/07/2017 06/07/2017 05/10/2017 03/08/2017 02/03/2017 01/06/2017 12/07/2016  Decreased Interest 1 1 1 1  0 1 1  Down, Depressed, Hopeless 1 1 1 1  0 1 1  PHQ - 2 Score 2 2 2 2  0 2 2  Altered sleeping - - - - - - -  Tired, decreased energy - - - - - - -  Change in appetite - - - - - - -  Feeling bad or failure about yourself  - - - - - - -  Trouble concentrating - - - - - - -  Moving slowly or fidgety/restless - - - - - - -  Suicidal thoughts - - - - - - -  PHQ-9 Score - - - - - - -  Difficult doing work/chores - - - - - - -    Review of Systems  Constitutional: Negative.   HENT: Negative.   Eyes: Negative.   Respiratory: Negative.   Cardiovascular: Negative.   Gastrointestinal: Negative.   Endocrine: Negative.   Genitourinary: Negative.   Musculoskeletal: Negative.   Skin: Negative.   Allergic/Immunologic: Negative.   Neurological: Negative.   Hematological: Negative.   Psychiatric/Behavioral: Negative.   All other systems reviewed and are negative.      Objective:   Physical Exam  Constitutional: She is oriented to person, place, and time. She appears well-developed and well-nourished.  HENT:  Head: Normocephalic and atraumatic.  Neck: Normal range of motion. Neck supple.  Cardiovascular: Normal rate and regular rhythm.  Pulmonary/Chest: Effort normal and breath sounds normal.  Musculoskeletal:  Normal Muscle Bulk and Muscle Testing Reveals: Upper Extremities: Full ROM and Muscle Strength 5/5 Lumbar Hypersensitivity Left Greater  Trochanter Tenderness Lower Extremities: Full ROM and Muscle Strength 5/5 Arises from Table Slowly using straight cane for support Antalgic Gait   Neurological: She is alert and oriented to person, place, and time.  Skin: Skin is warm and dry.  Psychiatric: She has a normal mood and affect. Her behavior is normal.  Nursing note  and vitals reviewed.         Assessment & Plan:  1. 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 Status post decompressive laminectomy: Continue to Monitor06/18/2019:Refilled:Oxycodone 10mg /325 mg one tablet five times a day as needed#140. We will continue the opioid monitoring program,this consists of regular clinic visits, examinations, urine drug screen,pill counts as well as use of [place] Controlled Substance Reporting System. 2. Insomnia/ Anxiety: Continue current medication regimen with  Xanax. 09/07/2017 3. Lumbar Radiculopathy/Neuropathic pain: Continue current medication regimen with Gabapentin to 300mg  TID.09/07/2017. 4. Muscle Spasms. Continue current medication regimen with: Tizanidine. 09/07/2017 5. Neurogenic bowel and bladder- Continue to monitor.09/07/2017 6. Constipation/Opioid Therapy: Continue current medication regimen with Linzess. 09/07/2017 7. Depression: Continue current medication regimen with Celexa.09/07/2017 8. Left Greater Trochanteric Bursitis: Continue Heat/Ice Therapy. 09/07/2017 9. Chronic Pain Syndrome: Continue current medication regimen with Voltaren.09/07/2017  20 minutes of face to face patient care time was spent during this visit. All questions were encouraged and answered.  F/U in 1 month

## 2017-10-04 ENCOUNTER — Encounter
Payer: No Typology Code available for payment source | Attending: Physical Medicine and Rehabilitation | Admitting: Physical Medicine & Rehabilitation

## 2017-10-04 ENCOUNTER — Encounter: Payer: Self-pay | Admitting: Physical Medicine & Rehabilitation

## 2017-10-04 VITALS — BP 117/81 | HR 88 | Ht 65.0 in | Wt 254.0 lb

## 2017-10-04 DIAGNOSIS — M7061 Trochanteric bursitis, right hip: Secondary | ICD-10-CM

## 2017-10-04 DIAGNOSIS — Z5181 Encounter for therapeutic drug level monitoring: Secondary | ICD-10-CM

## 2017-10-04 DIAGNOSIS — G834 Cauda equina syndrome: Secondary | ICD-10-CM | POA: Insufficient documentation

## 2017-10-04 DIAGNOSIS — Z79899 Other long term (current) drug therapy: Secondary | ICD-10-CM

## 2017-10-04 DIAGNOSIS — M48061 Spinal stenosis, lumbar region without neurogenic claudication: Secondary | ICD-10-CM | POA: Insufficient documentation

## 2017-10-04 DIAGNOSIS — M5126 Other intervertebral disc displacement, lumbar region: Secondary | ICD-10-CM | POA: Insufficient documentation

## 2017-10-04 MED ORDER — OXYCODONE-ACETAMINOPHEN 10-325 MG PO TABS: ORAL_TABLET | ORAL | 0 refills | Status: DC

## 2017-10-04 NOTE — Patient Instructions (Signed)
PLEASE FEEL FREE TO CALL OUR OFFICE WITH ANY PROBLEMS OR QUESTIONS ([phone])      

## 2017-10-04 NOTE — Progress Notes (Signed)
Subjective:    Patient ID: [name], female    DOB: [birth year], 47 y.o.   MRN: [id number]  HPI  [name] is here in follow-up of her cauda equina syndrome.  She has continued to work on her home exercise program.  She likes to get to the pool and work on walking and balance.  Using a cane for support when out of the house.  She denies any falls or new problems.  Pain remains an issue in her low back and legs.  She is taking Percocet 10/325 1 every 4 to 5 hours as needed she states that this helps her pain.  She reports no new bowel or bladder issues.  She has had some recent neck tightness and has discussed follow-up with her surgeon regarding her prior cervical surgery.  Her mood has been down at times but is improving.  She has started doing some volunteer work and has got out of the house much more which is helped her from an emotional standpoint.   Pain Inventory Average Pain 6 Pain Right Now 6 My pain is constant, sharp, dull, stabbing and aching  In the last 24 hours, has pain interfered with the following? General activity 6 Relation with others 6 Enjoyment of life 6 What TIME of day is your pain at its worst? all Sleep (in general) Poor  Pain is worse with: walking, bending, sitting, standing and some activites Pain improves with: heat/ice, therapy/exercise and medication Relief from Meds: 2  Mobility walk with assistance use a cane ability to climb steps?  no do you drive?  no  Function not employed: date last employed .  Neuro/Psych weakness numbness tingling trouble walking spasms  Prior Studies Any changes since last visit?  no  Physicians involved in your care Any changes since last visit?  no   Family History  Problem Relation Age of Onset  . Hypertension Maternal Grandmother   . Anesthesia problems Neg Hx    Social History   Socioeconomic History  . Marital status: Divorced    Spouse name: Not on file  . Number of children: Not on file    . Years of education: Not on file  . Highest education level: Not on file  Occupational History  . Not on file  Social Needs  . Financial resource strain: Not on file  . Food insecurity:    Worry: Not on file    Inability: Not on file  . Transportation needs:    Medical: Not on file    Non-medical: Not on file  Tobacco Use  . Smoking status: Never Smoker  . Smokeless tobacco: Never Used  Substance and Sexual Activity  . Alcohol use: No  . Drug use: No  . Sexual activity: Not Currently    Birth control/protection: None    Comment: IUD removed 2 wks ago.  Lifestyle  . Physical activity:    Days per week: Not on file    Minutes per session: Not on file  . Stress: Not on file  Relationships  . Social connections:    Talks on phone: Not on file    Gets together: Not on file    Attends religious service: Not on file    Active member of club or organization: Not on file    Attends meetings of clubs or organizations: Not on file    Relationship status: Not on file  Other Topics Concern  . Not on file  Social History Narrative  .  Not on file   Past Surgical History:  Procedure Laterality Date  . ABDOMINAL HYSTERECTOMY  06/29/11   Robotic Assisted Hysterectomy  . CERVICAL SPINE SURGERY  [place]  . LUMBAR LAMINECTOMY  08/31/2011   Procedure: MICRODISCECTOMY LUMBAR LAMINECTOMY;  Surgeon: Tobi Bastos, MD;  Location: [facility];  Service: Orthopedics;  Laterality: N/A;  lumbar five sacral one central microdisectomy  . MYOMECTOMY  13  . NECK SURGERY    . POSTERIOR CERVICAL FUSION/FORAMINOTOMY N/A 02/02/2013   Procedure: POSTERIOR C5-6 SPINAL FUSION;  Surgeon: Melina Schools, MD;  Location: [facility];  Service: Orthopedics;  Laterality: N/A;  . POSTERIOR FUSION CERVICAL SPINE  02/02/2013   C 5 C6     [name]  . WISDOM TOOTH EXTRACTION     Past Medical History:  Diagnosis Date  . Anemia    hx  . Arthritis   . Complication of anesthesia    slow to wake up,    very difficult to get iv had picc line last time  . Difficult intravenous access    hands are usually best for blood draws and IV starts  . DVT (deep venous thrombosis) ([place])    ? vs lupus  . Fibroids   . Headache(784.0)   . Neck pain   . Scoliosis    BP 117/81   Pulse 88   Ht 5\' 5"  (1.651 m)   Wt 254 lb (115.2 kg)   LMP 04/01/2011   SpO2 98%   BMI 42.27 kg/m   Opioid Risk Score:   Fall Risk Score:  `1  Depression screen PHQ 2/9  Depression screen [facility] 2/9 09/07/2017 06/07/2017 05/10/2017 03/08/2017 02/03/2017 01/06/2017 12/07/2016  Decreased Interest 1 1 1 1  0 1 1  Down, Depressed, Hopeless 1 1 1 1  0 1 1  PHQ - 2 Score 2 2 2 2  0 2 2  Altered sleeping - - - - - - -  Tired, decreased energy - - - - - - -  Change in appetite - - - - - - -  Feeling bad or failure about yourself  - - - - - - -  Trouble concentrating - - - - - - -  Moving slowly or fidgety/restless - - - - - - -  Suicidal thoughts - - - - - - -  PHQ-9 Score - - - - - - -  Difficult doing work/chores - - - - - - -     Review of Systems  Cardiovascular: Negative.   Gastrointestinal: Positive for constipation and nausea.  Musculoskeletal: Positive for arthralgias and myalgias.  Skin: Negative.   Neurological: Positive for weakness and numbness.  Hematological: Negative.   Psychiatric/Behavioral: Negative.        Objective:   Physical Exam  General: No acute distress HEENT: EOMI, oral membranes moist Cards: reg rate  Chest: normal effort Abdomen: Soft, NT, ND Skin: dry, intact Extremities: no edema   Musculoskeletal:  LB tender to touch. Mild valgus deformities bilaterally knees Neuro: cane for balance, but overall weight shift better, stride width and length improved. Strength nearly 5/5. Sensory exam stable. DTR's decr.  .  Skin: Skin is warm and dry.  Psychiatric: Pleasant and very upbeat.       Assessment & Plan:  1. 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 Status  post decompressive laminectomy:  Refilled oxycodone 10/325 q6 prn  #135.We will continue the controlled substance monitoring program, this consists of  regular clinic visits, examinations, routine drug screening, pill counts as well as use of [place] Controlled Substance Reporting System. [place].   -goal is still to decrease to 120 pills/month.    -continue voltaren 75mg  bid which remain efficacious  -maintain HEP as we've discussed. Needs to keep strengthening legs. Water therapy is great.  -UDS today.  2. Insomnia: PRN xanax at bedtime 3. Neuropathic pain: continue Gabapentinat300mg  TID  4. Muscle Spasms. Continue: Tizanidine 5. Neurogenic bowel and bladder- Continue to monitor 6. Constipation/Opioid Therapy: Continue Linzess which is effective 7. Depression: Continue Celexa.  Patient doing much better with her physical recovery helping along those lines.  She is also doing well with her social reintegration.  I encouraged her to look into some sedentary work moving forward also. 8. Bilateral Greater Trochanteric Bursitis: continue HEP 9. Mild right RTC strain--improved.  66minutes of face to face patient care time was spent during this visit. All questions were encouraged and answered. Follow up with NP in a month

## 2017-10-11 ENCOUNTER — Telehealth: Payer: Self-pay | Admitting: *Deleted

## 2017-10-11 LAB — TOXASSURE SELECT,+ANTIDEPR,UR

## 2017-10-11 NOTE — Telephone Encounter (Signed)
Urine drug screen for this encounter is consistent for prescribed medication 

## 2017-10-31 ENCOUNTER — Emergency Department (HOSPITAL_BASED_OUTPATIENT_CLINIC_OR_DEPARTMENT_OTHER)
Admission: EM | Admit: 2017-10-31 | Discharge: 2017-10-31 | Disposition: A | Discharge status: Home / Self Care | Payer: No Typology Code available for payment source | Attending: Emergency Medicine | Admitting: Emergency Medicine

## 2017-10-31 ENCOUNTER — Emergency Department (HOSPITAL_BASED_OUTPATIENT_CLINIC_OR_DEPARTMENT_OTHER): Payer: No Typology Code available for payment source

## 2017-10-31 ENCOUNTER — Encounter (HOSPITAL_BASED_OUTPATIENT_CLINIC_OR_DEPARTMENT_OTHER): Payer: Self-pay | Admitting: Emergency Medicine

## 2017-10-31 ENCOUNTER — Other Ambulatory Visit: Payer: Self-pay

## 2017-10-31 DIAGNOSIS — Z79899 Other long term (current) drug therapy: Secondary | ICD-10-CM | POA: Insufficient documentation

## 2017-10-31 DIAGNOSIS — M419 Scoliosis, unspecified: Secondary | ICD-10-CM | POA: Insufficient documentation

## 2017-10-31 DIAGNOSIS — I10 Essential (primary) hypertension: Secondary | ICD-10-CM | POA: Insufficient documentation

## 2017-10-31 DIAGNOSIS — M25521 Pain in right elbow: Secondary | ICD-10-CM

## 2017-10-31 MED ORDER — KETOROLAC TROMETHAMINE 60 MG/2ML IM SOLN
60.0000 mg | Freq: Once | INTRAMUSCULAR | Status: AC
  Administered 2017-10-31: 60 mg via INTRAMUSCULAR
  Filled 2017-10-31: qty 2

## 2017-10-31 NOTE — ED Notes (Signed)
To xray at this time, no changes.

## 2017-10-31 NOTE — ED Notes (Signed)
Dr. Regenia Skeeter at [facility].

## 2017-10-31 NOTE — Discharge Instructions (Signed)
If you develop fever, redness, swelling, or worsening pain or any other new/concerning symptoms or return to the ER for evaluation.  Otherwise follow-up with your primary care physician.

## 2017-10-31 NOTE — ED Triage Notes (Addendum)
Patient states that she has had pain to her right elbow and lower arm x 2 weeks. The patient reports that she has lupus  - patient denies any injury

## 2017-10-31 NOTE — ED Provider Notes (Signed)
[facility] EMERGENCY DEPARTMENT Provider Note   CSN: [id number] Arrival date & time: 10/31/17  2008     History   Chief Complaint Chief Complaint  Patient presents with  . Elbow Pain    HPI [name] is a 47 y.o. female.  HPI  47 year old female with a history of lupus presents with concern for lupus flare in her right elbow.  She states that 2 weeks ago she developed atraumatic right elbow pain.  Did not think much of it but it has progressively worsened.  Significantly worse 2 days ago and has progressively worsened since.  She is taken some ibuprofen and Percocet which she has for her chronic back pain.  States it was red and warm a couple days ago but not now.  Painful to perform range of motion.  No weakness or numbness.  No known injuries.  She feels like it swollen.  Past Medical History:  Diagnosis Date  . Anemia    hx  . Arthritis   . Complication of anesthesia    slow to wake up,   very difficult to get iv had picc line last time  . Difficult intravenous access    hands are usually best for blood draws and IV starts  . DVT (deep venous thrombosis) ([place])    ? vs lupus  . Fibroids   . Headache(784.0)   . Neck pain   . Scoliosis     Patient Active Problem List   Diagnosis Date Noted  . Greater trochanteric bursitis of right hip 02/12/2015  . Metabolic syndrome 76/19/5093  . Other constipation 05/24/2014  . Greater trochanteric bursitis of left hip 09/08/2013  . UTI (urinary tract infection) 09/03/2011  . Hypokalemia 09/03/2011  . Cauda equina syndrome ([place]) 09/03/2011  . Spinal stenosis of lumbar region 08/31/2011  . Lumbar herniated disc 08/31/2011  . Abdominal pain, acute, generalized 08/28/2011  . Nausea and vomiting 08/28/2011  . Dehydration 08/28/2011  . S/P hysterectomy 4/13 08/28/2011  . Menometrorrhagia 05/21/2011  . DVT of right leg (deep venous thrombosis) in 01/2011 05/21/2011  . Fibroid uterus 03/20/2011  . LBP (low back  pain) 05/29/2010  . Avitaminosis D 04/23/2010  . Benign essential HTN 04/21/2010  . Essential (primary) hypertension 04/21/2010  . Allergic rhinitis 11/01/2009  . Absolute anemia 11/01/2009  . Headache, migraine 11/01/2009  . Adiposity 11/01/2009    Past Surgical History:  Procedure Laterality Date  . ABDOMINAL HYSTERECTOMY  06/29/11   Robotic Assisted Hysterectomy  . CERVICAL SPINE SURGERY  [place]  . LUMBAR LAMINECTOMY  08/31/2011   Procedure: MICRODISCECTOMY LUMBAR LAMINECTOMY;  Surgeon: Tobi Bastos, MD;  [facility];  Service: Orthopedics;  Laterality: N/A;  lumbar five sacral one central microdisectomy  . MYOMECTOMY  13  . NECK SURGERY    . POSTERIOR CERVICAL FUSION/FORAMINOTOMY N/A 02/02/2013   Procedure: POSTERIOR C5-6 SPINAL FUSION;  Surgeon: Melina Schools, MD;  Location: [facility];  Service: Orthopedics;  Laterality: N/A;  . POSTERIOR FUSION CERVICAL SPINE  02/02/2013   C 5 C6     [name]  . WISDOM TOOTH EXTRACTION       OB History    Gravida  2   Para  1   Term  1   Preterm  0   AB  1   Living  1     SAB  1   TAB  0   Ectopic  0   Multiple  0   Live Births               Home Medications    Prior to Admission medications   Medication Sig Start Date End Date Taking? Authorizing Provider  ALPRAZolam (XANAX) 0.25 MG tablet Take 1 tablet (0.25 mg total) by mouth at bedtime. 08/03/17   Bayard Hugger, NP  cetirizine (ZYRTEC) 10 MG tablet Take 1 tablet (10 mg total) by mouth daily. 05/22/14   Dorena Dew, FNP  citalopram (CELEXA) 10 MG tablet Take 1 tablet (10 mg total) by mouth daily. 09/07/17   Bayard Hugger, NP  diclofenac (VOLTAREN) 75 MG EC tablet Take 1 tablet (75 mg total) by mouth 2 (two) times daily with a meal. 09/07/17   Bayard Hugger, NP  ergocalciferol (DRISDOL) 50000 UNITS capsule Take 1 capsule (50,000 Units total) by mouth once a week. 05/30/14   Dorena Dew, FNP  gabapentin (NEURONTIN) 300 MG  capsule One capsule in the morning, afternoon, dinner and two capsules at bedtime. 09/07/17   Bayard Hugger, NP  linaclotide [facility]) 290 MCG CAPS capsule Take 1 capsule (290 mcg total) by mouth daily. 09/28/16   Tresa Garter, MD  oxyCODONE-acetaminophen (PERCOCET) 10-325 MG tablet Take 1 tablet (10/325 mg total) by mouth 5 (five) times daily as needed. 10/04/17   Meredith Staggers, MD  phenazopyridine (PYRIDIUM) 100 MG tablet Pyridium 100 mg tablet  Take 1 tablet 3 times a day by oral route.    [provider]  phentermine (ADIPEX-P) 37.5 MG tablet Take 37.5 mg by mouth every morning. 12/11/14   [provider]  promethazine (PHENERGAN) 12.5 MG tablet Take 1 tablet (12.5 mg total) by mouth every 6 (six) hours as needed for nausea or vomiting. 03/08/17   Bayard Hugger, NP  tiZANidine (ZANAFLEX) 4 MG tablet May take 1- 2 tablets every 8 hours as needed for muscle spasms 09/07/17   Bayard Hugger, NP  IRON PO Take 1 tablet by mouth 2 (two) times daily.   06/08/11  [provider]    Family History Family History  Problem Relation Age of Onset  . Hypertension Maternal Grandmother   . Anesthesia problems Neg Hx     Social History Social History   Tobacco Use  . Smoking status: Never Smoker  . Smokeless tobacco: Never Used  Substance Use Topics  . Alcohol use: No  . Drug use: No     Allergies   Codeine; Iodine; Penicillins; Shrimp [shellfish allergy]; Contrast media [iodinated diagnostic agents]; and Meperidine and related   Review of Systems Review of Systems  Constitutional: Negative for fever.  Musculoskeletal: Positive for arthralgias and joint swelling.  Skin: Positive for color change. Negative for wound.  Neurological: Negative for weakness and numbness.     Physical Exam Updated Vital Signs BP (!) 133/91 (BP Location: Left Arm)   Pulse 97   Temp 98 F (36.7 C) (Oral)   Resp 18   Ht 5\' 5"  (1.651 m)   Wt 112 kg   LMP 04/01/2011    SpO2 100%   BMI 41.10 kg/m   Physical Exam  Constitutional: She is oriented to person, place, and time. She appears well-developed and well-nourished.  HENT:  Head: Normocephalic and atraumatic.  Right Ear: External ear normal.  Left Ear: External ear normal.  Nose: Nose normal.  Eyes: Right eye exhibits no discharge. Left eye exhibits no discharge.  Cardiovascular: Normal rate and regular rhythm.  Pulses:  Radial pulses are 2+ on the right side.  Pulmonary/Chest: Effort normal.  Musculoskeletal:       Right elbow: She exhibits normal range of motion and no swelling. Tenderness found.  Diffuse, hard to localize and reproduce tenderness to the right lateral elbow. No specific bony tenderness. I do not appreciate any swelling or effusion. No erythema or warmth Normal radial, ulnar and median nerve testing in right arm  Neurological: She is alert and oriented to person, place, and time.  Skin: Skin is warm and dry.  Nursing note and vitals reviewed.    ED Treatments / Results  Labs (all labs ordered are listed, but only abnormal results are displayed) Labs Reviewed - No data to display  EKG None  Radiology Dg Elbow Complete Right  Result Date: 10/31/2017 CLINICAL DATA:  Acute onset right elbow pain for 2 days.  No injury. EXAM: RIGHT ELBOW - COMPLETE 3+ VIEW COMPARISON:  None. FINDINGS: There is no evidence of fracture, dislocation, or joint effusion. There is no evidence of arthropathy or other focal bone abnormality. Soft tissues are unremarkable. IMPRESSION: Negative. Electronically Signed   By: Lucienne Capers M.D.   On: 10/31/2017 23:13    Procedures Procedures (including critical care time)  Medications Ordered in ED Medications  ketorolac (TORADOL) injection 60 mg (60 mg Intramuscular Given 10/31/17 2259)     Initial Impression / Assessment and Plan / ED Course  I have reviewed the triage vital signs and the nursing notes.  Pertinent labs & imaging  results that were available during my care of the patient were reviewed by me and considered in my medical decision making (see chart for details).     Does not appear to be any joint effusion or soft tissue infection.  I highly doubt septic joint or skin infection at all.  No trauma.  Full range of motion.  Neurovascular intact.  Possibly an overuse injury versus inflammation but I do not see much inflammation on exam.  Given IM Toradol.  Follow-up with PCP.  Return precautions.  Final Clinical Impressions(s) / ED Diagnoses   Final diagnoses:  Right elbow pain    ED Discharge Orders    None       Sherwood Gambler, MD 10/31/17 2327

## 2017-10-31 NOTE — ED Notes (Signed)
Alert, NAD, calm, interactive, resps e/u, speaking in clear complete sentences, no dyspnea noted, skin W&D, initial VSS, here for R arm pain, ongoing for ~ 2 weeks, relates sx to her lupus, last flare 4-5 months ago, Dr. Hulan Fray rheum, only takes hydroxychloroquine, reports pain from mid upper arm to hand, primarily at elbow, endorses "there was heat and redness to R arm" (not discernable now), (denies: fever, body aches, other joint issues, fall or injury, sob, nausea, numbness, tingling, weakness, dizziness or visual changes). Family at [facility].

## 2017-11-08 ENCOUNTER — Encounter
Payer: No Typology Code available for payment source | Attending: Physical Medicine and Rehabilitation | Admitting: Registered Nurse

## 2017-11-08 ENCOUNTER — Encounter: Payer: Self-pay | Admitting: Registered Nurse

## 2017-11-08 VITALS — BP 136/83 | HR 85 | Ht 65.0 in | Wt 255.0 lb

## 2017-11-08 DIAGNOSIS — G834 Cauda equina syndrome: Secondary | ICD-10-CM

## 2017-11-08 DIAGNOSIS — M48061 Spinal stenosis, lumbar region without neurogenic claudication: Secondary | ICD-10-CM | POA: Insufficient documentation

## 2017-11-08 DIAGNOSIS — F411 Generalized anxiety disorder: Secondary | ICD-10-CM

## 2017-11-08 DIAGNOSIS — F5101 Primary insomnia: Secondary | ICD-10-CM

## 2017-11-08 DIAGNOSIS — M7062 Trochanteric bursitis, left hip: Secondary | ICD-10-CM

## 2017-11-08 DIAGNOSIS — G8929 Other chronic pain: Secondary | ICD-10-CM

## 2017-11-08 DIAGNOSIS — M7061 Trochanteric bursitis, right hip: Secondary | ICD-10-CM

## 2017-11-08 DIAGNOSIS — F3289 Other specified depressive episodes: Secondary | ICD-10-CM

## 2017-11-08 DIAGNOSIS — Z79899 Other long term (current) drug therapy: Secondary | ICD-10-CM

## 2017-11-08 DIAGNOSIS — Z5181 Encounter for therapeutic drug level monitoring: Secondary | ICD-10-CM

## 2017-11-08 DIAGNOSIS — M25561 Pain in right knee: Secondary | ICD-10-CM

## 2017-11-08 DIAGNOSIS — M961 Postlaminectomy syndrome, not elsewhere classified: Secondary | ICD-10-CM

## 2017-11-08 DIAGNOSIS — M5126 Other intervertebral disc displacement, lumbar region: Secondary | ICD-10-CM | POA: Insufficient documentation

## 2017-11-08 DIAGNOSIS — M62830 Muscle spasm of back: Secondary | ICD-10-CM

## 2017-11-08 MED ORDER — OXYCODONE-ACETAMINOPHEN 10-325 MG PO TABS: ORAL_TABLET | ORAL | 0 refills | Status: DC

## 2017-11-08 MED ORDER — ALPRAZOLAM 0.25 MG PO TABS
0.2500 mg | ORAL_TABLET | Freq: Every day | ORAL | 2 refills | Status: DC
Start: 2017-11-08 — End: 2018-01-03

## 2017-11-08 NOTE — Progress Notes (Signed)
Subjective:    Patient ID: [name], female    DOB: 12/08/1970, 47 y.o.   MRN: [id number]  HPI: [name] is a 47 year old female who returns for follow up appointment for chronic pain and medication refill. She states her pain is located in her lower back, bilateral hips and right knee. She rates her pain 7. Her current exercise regime is water therapy at the [place] three days a week and walking.   [name] went to [facility] for Right Elbow Pain, note was reviewed. She received Toradol injection.   [name] Morphine equivalent is 72.32 MME. She is also prescribed Alprazolam. We have discussed the black box warning of using opioids and benzodiazepines. I highlighted the dangers of using these drugs together and discussed the adverse events including respiratory suppression, overdose, cognitive impairment and importance of compliance with current regimen. We will continue to monitor and adjust as indicated.   Last UDS was Performed on 10/04/2017, it was consistent.   Pain Inventory Average Pain 8 Pain Right Now 7 My pain is sharp, dull, stabbing, tingling and aching  In the last 24 hours, has pain interfered with the following? General activity 7 Relation with others 10 Enjoyment of life 9 What TIME of day is your pain at its worst? all Sleep (in general) Poor  Pain is worse with: walking, bending, sitting and standing Pain improves with: TENS Relief from Meds: 5  Mobility use a cane  Function Do you have any goals in this area?  no  Neuro/Psych No problems in this area  Prior Studies Any changes since last visit?  no  Physicians involved in your care Any changes since last visit?  no   Family History  Problem Relation Age of Onset  . Hypertension Maternal Grandmother   . Anesthesia problems Neg Hx    Social History   Socioeconomic History  . Marital status: Divorced    Spouse name: Not on file  . Number of  children: Not on file  . Years of education: Not on file  . Highest education level: Not on file  Occupational History  . Not on file  Social Needs  . Financial resource strain: Not on file  . Food insecurity:    Worry: Not on file    Inability: Not on file  . Transportation needs:    Medical: Not on file    Non-medical: Not on file  Tobacco Use  . Smoking status: Never Smoker  . Smokeless tobacco: Never Used  Substance and Sexual Activity  . Alcohol use: No  . Drug use: No  . Sexual activity: Not Currently    Birth control/protection: None    Comment: IUD removed 2 wks ago.  Lifestyle  . Physical activity:    Days per week: Not on file    Minutes per session: Not on file  . Stress: Not on file  Relationships  . Social connections:    Talks on phone: Not on file    Gets together: Not on file    Attends religious service: Not on file    Active member of club or organization: Not on file    Attends meetings of clubs or organizations: Not on file    Relationship status: Not on file  Other Topics Concern  . Not on file  Social History Narrative  . Not on file   Past Surgical History:  Procedure Laterality Date  . ABDOMINAL HYSTERECTOMY  06/29/11   Robotic Assisted Hysterectomy  . CERVICAL SPINE SURGERY  [place]  . LUMBAR LAMINECTOMY  08/31/2011   Procedure: MICRODISCECTOMY LUMBAR LAMINECTOMY;  Surgeon: Tobi Bastos, MD;  Location: [facility];  Service: Orthopedics;  Laterality: N/A;  lumbar five sacral one central microdisectomy  . MYOMECTOMY  13  . NECK SURGERY    . POSTERIOR CERVICAL FUSION/FORAMINOTOMY N/A 02/02/2013   Procedure: POSTERIOR C5-6 SPINAL FUSION;  Surgeon: Melina Schools, MD;  Location: [facility];  Service: Orthopedics;  Laterality: N/A;  . POSTERIOR FUSION CERVICAL SPINE  02/02/2013   C 5 C6     [name]  . WISDOM TOOTH EXTRACTION     Past Medical History:  Diagnosis Date  . Anemia    hx  . Arthritis   . Complication of  anesthesia    slow to wake up,   very difficult to get iv had picc line last time  . Difficult intravenous access    hands are usually best for blood draws and IV starts  . DVT (deep venous thrombosis) ([place])    ? vs lupus  . Fibroids   . Headache(784.0)   . Neck pain   . Scoliosis    LMP 04/01/2011   Opioid Risk Score:   Fall Risk Score:  `1  Depression screen PHQ 2/9  Depression screen [facility] 2/9 09/07/2017 06/07/2017 05/10/2017 03/08/2017 02/03/2017 01/06/2017 12/07/2016  Decreased Interest 1 1 1 1  0 1 1  Down, Depressed, Hopeless 1 1 1 1  0 1 1  PHQ - 2 Score 2 2 2 2  0 2 2  Altered sleeping - - - - - - -  Tired, decreased energy - - - - - - -  Change in appetite - - - - - - -  Feeling bad or failure about yourself  - - - - - - -  Trouble concentrating - - - - - - -  Moving slowly or fidgety/restless - - - - - - -  Suicidal thoughts - - - - - - -  PHQ-9 Score - - - - - - -  Difficult doing work/chores - - - - - - -     Review of Systems  Constitutional: Negative.   HENT: Negative.   Eyes: Negative.   Respiratory: Negative.   Cardiovascular: Negative.   Gastrointestinal: Negative.   Endocrine: Negative.   Genitourinary: Negative.   Musculoskeletal: Positive for arthralgias, back pain, gait problem and myalgias.  Skin: Negative.   Allergic/Immunologic: Negative.   Hematological: Negative.   Psychiatric/Behavioral: Negative.   All other systems reviewed and are negative.      Objective:   Physical Exam  Constitutional: She is oriented to person, place, and time. She appears well-developed and well-nourished.  HENT:  Head: Normocephalic and atraumatic.  Neck: Normal range of motion. Neck supple.  Cardiovascular: Normal rate and regular rhythm.  Pulmonary/Chest: Effort normal and breath sounds normal.  Musculoskeletal:  Normal Muscle Bulk and Muscle Testing Reveals: Upper Extremities: Full ROM and Muscle Strength 5/5 Bilateral AC Joint Tenderness Lumbar Paraspinal  Tenderness: L-3-L-5 Bilateral Greater Trochanter Tenderness Lower Extremities: Full ROM and Muscle Strength 5/5 Arises from Table slowly using cane for support Narrow Based gait   Neurological: She is alert and oriented to person, place, and time.  Skin: Skin is warm and dry.  Psychiatric: She has a normal mood and affect.  Nursing note and vitals reviewed.  Assessment & Plan:  1. 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 Status post decompressive laminectomy: Continue to MonitorRefilled:Oxycodone 10mg /325 mg one tablet five times a day as needed#135. 11/08/2017 We will continue the opioid monitoring program,this consists of regular clinic visits, examinations, urine drug screen,pill counts as well as use of [place] Controlled Substance Reporting System. 2. Insomnia/ Anxiety: Continuecurrent medication regimen withXanax. 11/08/2017 3. Lumbar Radiculopathy/Neuropathic pain: Continuecurrent medication regimen withGabapentin.11/08/2017. 4. Muscle Spasms. Continuecurrent medication regimen with: Tizanidine. 11/08/2017 5. Neurogenic bowel and bladder. Continue to monitor.11/08/2017 6. Constipation/Opioid Therapy: Continuecurrent medication regimen with Linzess. 11/08/2017 7. Depression: Continuecurrent medication regimen withCelexa.11/08/2017 8. Bilateral Greater Trochanteric Bursitis: Continue Heat/Ice Therapy. 11/08/2017 9. Chronic Pain Syndrome: Continuecurrent medication regimen withVoltaren.11/08/2017 10. Chronic Right Knee Pain: Continue current medication regimen. 11/08/2017.  20 minutes of face to face patient care time was spent during this visit. All questions were encouraged and answered.  F/U in 1 month

## 2017-12-06 ENCOUNTER — Encounter
Payer: No Typology Code available for payment source | Attending: Physical Medicine and Rehabilitation | Admitting: Registered Nurse

## 2017-12-06 ENCOUNTER — Encounter: Payer: Self-pay | Admitting: Registered Nurse

## 2017-12-06 ENCOUNTER — Telehealth: Payer: Self-pay | Admitting: Registered Nurse

## 2017-12-06 VITALS — BP 121/82 | HR 88 | Resp 14 | Ht 65.0 in | Wt 249.0 lb

## 2017-12-06 DIAGNOSIS — Z79899 Other long term (current) drug therapy: Secondary | ICD-10-CM

## 2017-12-06 DIAGNOSIS — M542 Cervicalgia: Secondary | ICD-10-CM

## 2017-12-06 DIAGNOSIS — M5126 Other intervertebral disc displacement, lumbar region: Secondary | ICD-10-CM | POA: Insufficient documentation

## 2017-12-06 DIAGNOSIS — M7061 Trochanteric bursitis, right hip: Secondary | ICD-10-CM

## 2017-12-06 DIAGNOSIS — F411 Generalized anxiety disorder: Secondary | ICD-10-CM

## 2017-12-06 DIAGNOSIS — G834 Cauda equina syndrome: Secondary | ICD-10-CM

## 2017-12-06 DIAGNOSIS — Z5181 Encounter for therapeutic drug level monitoring: Secondary | ICD-10-CM

## 2017-12-06 DIAGNOSIS — M48061 Spinal stenosis, lumbar region without neurogenic claudication: Secondary | ICD-10-CM | POA: Insufficient documentation

## 2017-12-06 DIAGNOSIS — M7062 Trochanteric bursitis, left hip: Secondary | ICD-10-CM

## 2017-12-06 DIAGNOSIS — F3289 Other specified depressive episodes: Secondary | ICD-10-CM

## 2017-12-06 DIAGNOSIS — G894 Chronic pain syndrome: Secondary | ICD-10-CM

## 2017-12-06 DIAGNOSIS — F5101 Primary insomnia: Secondary | ICD-10-CM

## 2017-12-06 DIAGNOSIS — M62830 Muscle spasm of back: Secondary | ICD-10-CM

## 2017-12-06 DIAGNOSIS — M961 Postlaminectomy syndrome, not elsewhere classified: Secondary | ICD-10-CM

## 2017-12-06 MED ORDER — CITALOPRAM HYDROBROMIDE 10 MG PO TABS: 10.0000 mg | ORAL_TABLET | Freq: Every day | ORAL | 3 refills | Status: DC

## 2017-12-06 MED ORDER — DICLOFENAC SODIUM 75 MG PO TBEC: 75.0000 mg | DELAYED_RELEASE_TABLET | Freq: Two times a day (BID) | ORAL | 3 refills | Status: DC

## 2017-12-06 MED ORDER — GABAPENTIN 300 MG PO CAPS: ORAL_CAPSULE | ORAL | 3 refills | Status: DC

## 2017-12-06 MED ORDER — TIZANIDINE HCL 4 MG PO TABS
ORAL_TABLET | ORAL | 3 refills | Status: DC
Start: 2017-12-06 — End: 2018-04-04

## 2017-12-06 NOTE — Progress Notes (Signed)
Subjective:    Patient ID: [name], female    DOB: March 14, 1971, 47 y.o.   MRN: [id number]  HPI: [name] is a 47 year old female who returns for follow up appointment for chronic pain and medication refill. She states her pain is located in her neck,lower back radiating into her bilateral lower extremities and left hip. She rates her pain 10. Her current exercise regime is walking and poll therapy three times a week.   [name] has Methocarbamol in her Oxycodone pill box, she states she asked her aide to put her Oxycodone in her pill box, while she finish getting ready this morning. She's only had an aide for about a month, her mother usually set's up her pill organizer for the week she reports. In the past [name] was prescribed Methocarbamol, she was instructed to remove all medications from her pill caddy, she verbalizes understanding. [name] is adamant she has been taking her Oxycodone, when she was shown the pills in her Oxycodone bottle she knew it was methocarbamol. She was instructed to go home and look for her Oxycodone and was advised if her Oxycodone is missing to file a police report, she verbalizes understanding.   [name] very upset regarding the above and tearful, emotional support was offered and she was encouraged to look for her oxycodone. She also was instructed to have her son or mother bring in the Oxycodone for a count, she verbalizes understanding.   [name] Morphine Equivalent is 75.00 MME. She is also prescribed Alprazolam .We have discussed the black box warning of using opioids and benzodiazepines. I highlighted the dangers of using these drugs together and discussed the adverse events including respiratory suppression, overdose, cognitive impairment and importance of compliance with current regimen. We will continue to monitor and adjust as indicated.   Last UDS was Performed on 10/04/2017, it was consistent.    Pain  Inventory Average Pain 9 Pain Right Now 10 My pain is sharp, dull, stabbing, tingling and aching  In the last 24 hours, has pain interfered with the following? General activity 9 Relation with others 9 Enjoyment of life 9 What TIME of day is your pain at its worst? all Sleep (in general) Poor  Pain is worse with: walking, bending, sitting and standing Pain improves with: medication Relief from Meds: 2  Mobility use a cane do you drive?  no  Function not employed: date last employed . I need assistance with the following:  household duties and shopping  Neuro/Psych No problems in this area  Prior Studies Any changes since last visit?  no  Physicians involved in your care Any changes since last visit?  no   Family History  Problem Relation Age of Onset  . Hypertension Maternal Grandmother   . Anesthesia problems Neg Hx    Social History   Socioeconomic History  . Marital status: Divorced    Spouse name: Not on file  . Number of children: Not on file  . Years of education: Not on file  . Highest education level: Not on file  Occupational History  . Not on file  Social Needs  . Financial resource strain: Not on file  . Food insecurity:    Worry: Not on file    Inability: Not on file  . Transportation needs:    Medical: Not on file    Non-medical: Not on file  Tobacco Use  . Smoking status: Never Smoker  . Smokeless  tobacco: Never Used  Substance and Sexual Activity  . Alcohol use: No  . Drug use: No  . Sexual activity: Not Currently    Birth control/protection: None    Comment: IUD removed 2 wks ago.  Lifestyle  . Physical activity:    Days per week: Not on file    Minutes per session: Not on file  . Stress: Not on file  Relationships  . Social connections:    Talks on phone: Not on file    Gets together: Not on file    Attends religious service: Not on file    Active member of club or organization: Not on file    Attends meetings of clubs or  organizations: Not on file    Relationship status: Not on file  Other Topics Concern  . Not on file  Social History Narrative  . Not on file   Past Surgical History:  Procedure Laterality Date  . ABDOMINAL HYSTERECTOMY  06/29/11   Robotic Assisted Hysterectomy  . CERVICAL SPINE SURGERY  [place]  . LUMBAR LAMINECTOMY  08/31/2011   Procedure: MICRODISCECTOMY LUMBAR LAMINECTOMY;  Surgeon: Tobi Bastos, MD;  Location: [facility];  Service: Orthopedics;  Laterality: N/A;  lumbar five sacral one central microdisectomy  . MYOMECTOMY  13  . NECK SURGERY    . POSTERIOR CERVICAL FUSION/FORAMINOTOMY N/A 02/02/2013   Procedure: POSTERIOR C5-6 SPINAL FUSION;  Surgeon: Melina Schools, MD;  Location: [facility];  Service: Orthopedics;  Laterality: N/A;  . POSTERIOR FUSION CERVICAL SPINE  02/02/2013   C 5 C6     [name]  . WISDOM TOOTH EXTRACTION     Past Medical History:  Diagnosis Date  . Anemia    hx  . Arthritis   . Complication of anesthesia    slow to wake up,   very difficult to get iv had picc line last time  . Difficult intravenous access    hands are usually best for blood draws and IV starts  . DVT (deep venous thrombosis) ([place])    ? vs lupus  . Fibroids   . Headache(784.0)   . Neck pain   . Scoliosis    BP 121/82   Pulse 88   Resp 14   Ht 5\' 5"  (1.651 m)   Wt 249 lb (112.9 kg)   LMP 04/01/2011   SpO2 96%   BMI 41.44 kg/m   Opioid Risk Score:   Fall Risk Score:  `1  Depression screen PHQ 2/9  Depression screen [facility] 2/9 09/07/2017 06/07/2017 05/10/2017 03/08/2017 02/03/2017 01/06/2017 12/07/2016  Decreased Interest 1 1 1 1  0 1 1  Down, Depressed, Hopeless 1 1 1 1  0 1 1  PHQ - 2 Score 2 2 2 2  0 2 2  Altered sleeping - - - - - - -  Tired, decreased energy - - - - - - -  Change in appetite - - - - - - -  Feeling bad or failure about yourself  - - - - - - -  Trouble concentrating - - - - - - -  Moving slowly or fidgety/restless - - - - - - -  Suicidal  thoughts - - - - - - -  PHQ-9 Score - - - - - - -  Difficult doing work/chores - - - - - - -    Review of Systems  Constitutional: Negative.   HENT: Negative.   Eyes: Negative.   Respiratory: Negative.  Cardiovascular: Negative.   Gastrointestinal: Negative.   Endocrine: Negative.   Genitourinary: Negative.   Musculoskeletal: Positive for arthralgias, back pain, myalgias and neck pain.  Skin: Negative.   Allergic/Immunologic: Negative.   Neurological: Negative.   Hematological: Negative.   Psychiatric/Behavioral: Negative.        Objective:   Physical Exam  Constitutional: She is oriented to person, place, and time. She appears well-developed and well-nourished.  HENT:  Head: Normocephalic and atraumatic.  Neck: Normal range of motion. Neck supple.  Cervical Paraspinal Tenderness: C-5-C-6  Cardiovascular: Normal rate and regular rhythm.  Pulmonary/Chest: Effort normal and breath sounds normal.  Musculoskeletal:  Normal Muscle Bulk and Muscle Testing Reveals: Upper Extremities: Full ROM and Muscle Strength 5/5 Lumbar Paraspinal Tenderness: L-3-L-5 Left Greater Trochanter Tenderness Lower Extremities: Full ROM and Muscle Strength 5/5 Arises from Table Slowly using cane for support Antalgic Gait  Neurological: She is alert and oriented to person, place, and time.  Skin: Skin is warm and dry.  Psychiatric: She has a normal mood and affect. Her behavior is normal.  Nursing note and vitals reviewed.         Assessment & Plan:  1. 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 Status post decompressive laminectomy: Continue to MonitorRefilled:Oxycodone 10mg /325 mg one tablet five times a day as needed#135. 12/06/2017 We will continue the opioid monitoring program,this consists of regular clinic visits, examinations, urine drug screen,pill counts as well as use of [place] Controlled Substance Reporting System. 2. Insomnia/ Anxiety:  Continuecurrent medication regimen withXanax. 12/06/2017 3. Lumbar Radiculopathy/Neuropathic pain: Continuecurrent medication regimen withGabapentin.12/06/2017. 4. Muscle Spasms. Continuecurrent medication regimen with: Tizanidine. 12/06/2017 5. Neurogenic bowel and bladder. Continue to monitor.12/06/2017 6. Constipation/Opioid Therapy: Continuecurrent medication regimen with Linzess. 12/06/2017 7. Depression: Continuecurrent medication regimen withCelexa.12/06/2017 8. Left Greater Trochanteric Bursitis: Continue Heat/Ice Therapy. 12/06/2017 9. Chronic Pain Syndrome: Continuecurrent medication regimen withVoltaren.12/06/2017 10. Chronic Right Knee Pain:No complaints today. Continue current medication regimen. 12/06/2017. 11. Cervicalgia: Continue current medication regimen. Continue to Monitor.   20 minutes of face to face patient care time was spent during this visit. All questions were encouraged and answered.  F/U in 1 month

## 2017-12-07 MED ORDER — OXYCODONE-ACETAMINOPHEN 10-325 MG PO TABS: ORAL_TABLET | ORAL | 0 refills | Status: DC

## 2017-12-07 NOTE — Progress Notes (Signed)
Patient returned to clinic with her pain medications:  oxycodone-acetaminophen 10-325 mg Fill date: 11/08/2017 Fill# 135 Verified# 9

## 2018-01-03 ENCOUNTER — Other Ambulatory Visit: Payer: Self-pay

## 2018-01-03 ENCOUNTER — Encounter
Payer: No Typology Code available for payment source | Attending: Physical Medicine and Rehabilitation | Admitting: Registered Nurse

## 2018-01-03 ENCOUNTER — Encounter: Payer: Self-pay | Admitting: Registered Nurse

## 2018-01-03 VITALS — BP 130/86 | HR 84 | Ht 65.0 in | Wt 248.0 lb

## 2018-01-03 DIAGNOSIS — G834 Cauda equina syndrome: Secondary | ICD-10-CM

## 2018-01-03 DIAGNOSIS — M542 Cervicalgia: Secondary | ICD-10-CM

## 2018-01-03 DIAGNOSIS — Z5181 Encounter for therapeutic drug level monitoring: Secondary | ICD-10-CM

## 2018-01-03 DIAGNOSIS — M48061 Spinal stenosis, lumbar region without neurogenic claudication: Secondary | ICD-10-CM | POA: Insufficient documentation

## 2018-01-03 DIAGNOSIS — M5412 Radiculopathy, cervical region: Secondary | ICD-10-CM

## 2018-01-03 DIAGNOSIS — M62830 Muscle spasm of back: Secondary | ICD-10-CM

## 2018-01-03 DIAGNOSIS — Z79899 Other long term (current) drug therapy: Secondary | ICD-10-CM

## 2018-01-03 DIAGNOSIS — M5416 Radiculopathy, lumbar region: Secondary | ICD-10-CM

## 2018-01-03 DIAGNOSIS — M961 Postlaminectomy syndrome, not elsewhere classified: Secondary | ICD-10-CM

## 2018-01-03 DIAGNOSIS — M5126 Other intervertebral disc displacement, lumbar region: Secondary | ICD-10-CM | POA: Insufficient documentation

## 2018-01-03 DIAGNOSIS — M7061 Trochanteric bursitis, right hip: Secondary | ICD-10-CM

## 2018-01-03 DIAGNOSIS — F5101 Primary insomnia: Secondary | ICD-10-CM

## 2018-01-03 DIAGNOSIS — F411 Generalized anxiety disorder: Secondary | ICD-10-CM

## 2018-01-03 DIAGNOSIS — M7062 Trochanteric bursitis, left hip: Secondary | ICD-10-CM

## 2018-01-03 DIAGNOSIS — F3289 Other specified depressive episodes: Secondary | ICD-10-CM

## 2018-01-03 DIAGNOSIS — G894 Chronic pain syndrome: Secondary | ICD-10-CM

## 2018-01-03 MED ORDER — ALPRAZOLAM 0.25 MG PO TABS: 0.2500 mg | ORAL_TABLET | Freq: Every day | ORAL | 2 refills | Status: DC

## 2018-01-03 MED ORDER — OXYCODONE-ACETAMINOPHEN 10-325 MG PO TABS: ORAL_TABLET | ORAL | 0 refills | Status: DC

## 2018-01-03 NOTE — Progress Notes (Signed)
Subjective:    Patient ID: [name], female    DOB: April 08, 1970, 47 y.o.   MRN: [id number]  HPI: [name] is a 47 year old female who returns for follow up appointment for chronic pain and medication refill. She states her pain is located in her neck radiating into her right shoulder and lower back  pain radiating into her left hip and bilateral lower extremities. She rates her pain 9. Her current exercise regime is walking and attending the [facility] two days a week.   Ms. Pemble Morphine Equivalent is 75.00 MME. She is also prescribed Alprazolam. .We have discussed the black box warning of using opioids and benzodiazepines. I highlighted the dangers of using these drugs together and discussed the adverse events including respiratory suppression, overdose, cognitive impairment and importance of compliance with current regimen. We will continue to monitor and adjust as indicated.   Last UDS was Performed on 10/04/2017, it was consistent.   Pain Inventory Average Pain 9 Pain Right Now 9 My pain is sharp, dull, stabbing, tingling and aching  In the last 24 hours, has pain interfered with the following? General activity 9 Relation with others 9 Enjoyment of life 9 What TIME of day is your pain at its worst? all Sleep (in general) Poor  Pain is worse with: walking, bending, sitting and standing Pain improves with: medication Relief from Meds: 6  Mobility use a cane do you drive?  no  Function not employed: date last employed n/a I need assistance with the following:  household duties and shopping  Neuro/Psych No problems in this area  Prior Studies Any changes since last visit?  no  Physicians involved in your care Any changes since last visit?  no   Family History  Problem Relation Age of Onset  . Hypertension Maternal Grandmother   . Anesthesia problems Neg Hx    Social History   Socioeconomic History  . Marital status: Divorced    Spouse  name: Not on file  . Number of children: Not on file  . Years of education: Not on file  . Highest education level: Not on file  Occupational History  . Not on file  Social Needs  . Financial resource strain: Not on file  . Food insecurity:    Worry: Not on file    Inability: Not on file  . Transportation needs:    Medical: Not on file    Non-medical: Not on file  Tobacco Use  . Smoking status: Never Smoker  . Smokeless tobacco: Never Used  Substance and Sexual Activity  . Alcohol use: No  . Drug use: No  . Sexual activity: Not Currently    Birth control/protection: None    Comment: IUD removed 2 wks ago.  Lifestyle  . Physical activity:    Days per week: Not on file    Minutes per session: Not on file  . Stress: Not on file  Relationships  . Social connections:    Talks on phone: Not on file    Gets together: Not on file    Attends religious service: Not on file    Active member of club or organization: Not on file    Attends meetings of clubs or organizations: Not on file    Relationship status: Not on file  Other Topics Concern  . Not on file  Social History Narrative  . Not on file   Past Surgical History:  Procedure Laterality Date  .  ABDOMINAL HYSTERECTOMY  06/29/11   Robotic Assisted Hysterectomy  . CERVICAL SPINE SURGERY  [place]  . LUMBAR LAMINECTOMY  08/31/2011   Procedure: MICRODISCECTOMY LUMBAR LAMINECTOMY;  Surgeon: Tobi Bastos, MD;  [facility];  Service: Orthopedics;  Laterality: N/A;  lumbar five sacral one central microdisectomy  . MYOMECTOMY  13  . NECK SURGERY    . POSTERIOR CERVICAL FUSION/FORAMINOTOMY N/A 02/02/2013   Procedure: POSTERIOR C5-6 SPINAL FUSION;  Surgeon: Melina Schools, MD;  Location: [place] [facility];  Service: Orthopedics;  Laterality: N/A;  . POSTERIOR FUSION CERVICAL SPINE  02/02/2013   C 5 C6     [name]  . WISDOM TOOTH EXTRACTION     Past Medical History:  Diagnosis Date  . Anemia    hx  .  Arthritis   . Complication of anesthesia    slow to wake up,   very difficult to get iv had picc line last time  . Difficult intravenous access    hands are usually best for blood draws and IV starts  . DVT (deep venous thrombosis) ([place])    ? vs lupus  . Fibroids   . Headache(784.0)   . Neck pain   . Scoliosis    BP 130/86   Pulse 84   Ht 5\' 5"  (1.651 m)   Wt 248 lb (112.5 kg)   LMP 04/01/2011   SpO2 93%   BMI 41.27 kg/m   Opioid Risk Score:   Fall Risk Score:  `1  Depression screen PHQ 2/9  Depression screen [facility] 2/9 01/03/2018 09/07/2017 06/07/2017 05/10/2017 03/08/2017 02/03/2017 01/06/2017  Decreased Interest 0 1 1 1 1  0 1  Down, Depressed, Hopeless 0 1 1 1 1  0 1  PHQ - 2 Score 0 2 2 2 2  0 2  Altered sleeping - - - - - - -  Tired, decreased energy - - - - - - -  Change in appetite - - - - - - -  Feeling bad or failure about yourself  - - - - - - -  Trouble concentrating - - - - - - -  Moving slowly or fidgety/restless - - - - - - -  Suicidal thoughts - - - - - - -  PHQ-9 Score - - - - - - -  Difficult doing work/chores - - - - - - -    Review of Systems  Constitutional: Negative.   HENT: Negative.   Eyes: Negative.   Respiratory: Negative.   Cardiovascular: Negative.   Gastrointestinal: Negative.   Endocrine: Negative.   Genitourinary: Negative.   Musculoskeletal: Negative.   Skin: Negative.   Allergic/Immunologic: Negative.   Neurological: Negative.   Hematological: Negative.   Psychiatric/Behavioral: Negative.   All other systems reviewed and are negative.      Objective:   Physical Exam  Constitutional: She is oriented to person, place, and time. She appears well-developed and well-nourished.  HENT:  Head: Normocephalic and atraumatic.  Neck: Normal range of motion. Neck supple.  Cervical Paraspinal Tenderness: C-5-C-6  Cardiovascular: Normal rate and regular rhythm.  Pulmonary/Chest: Effort normal and breath sounds normal.  Musculoskeletal:    Normal Muscle Bulk and Muscle Testing Reveals: Upper Extremities: Full ROM and Muscle Strength 5/5 Right AC Joint Tenderness Thoracic Paraspinal Tenderness: T-1-T-3 Lumbar Paraspinal Tenderness: L-3-L-5 Left Greater Trochanter Tenderness Lower Extremities: Full ROM and Muscle Strength 5/5 Arises from Table slowly using cane for support Narrow Based gait  Neurological: She  is alert and oriented to person, place, and time.  Skin: Skin is warm and dry.  Psychiatric: She has a normal mood and affect. Her behavior is normal.  Nursing note and vitals reviewed.         Assessment & Plan:  1. 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 Status post decompressive laminectomy: Continue to MonitorRefilled:Oxycodone 10mg /325 mg one tablet five times a day as needed#135. 01/03/2018 We will continue the opioid monitoring program,this consists of regular clinic visits, examinations, urine drug screen,pill counts as well as use of [place] Controlled Substance Reporting System. 2. Insomnia/ Anxiety: Continuecurrent medication regimen withXanax. 01/03/2018 3. Lumbar Radiculopathy/Neuropathic pain: Continuecurrent medication regimen withGabapentin.01/03/2018. 4. Muscle Spasms. Continuecurrent medication regimen with: Tizanidine. 01/03/2018 5. Neurogenic bowel and bladder. Continue to monitor.01/03/2018 6. Constipation/Opioid Therapy: Continuecurrent medication regimen with Linzess. 01/03/2018 7. Depression: Continuecurrent medication regimen withCelexa.01/03/2018 8. Left Greater Trochanteric Bursitis: Continue Heat/Ice Therapy. 01/03/2018 9. Chronic Pain Syndrome: Continuecurrent medication regimen withVoltaren.01/03/2018 10. Chronic Right Knee Pain:  No complaints today. Continue current medication regimen. 01/03/2018. 11. Cervicalgia/ Cervical Radiculitis: Continue current medication regimen. Continue to Monitor.   20 minutes of face to face  patient care time was spent during this visit. All questions were encouraged and answered.  F/U in 1 month

## 2018-02-01 ENCOUNTER — Encounter: Payer: Self-pay | Admitting: Registered Nurse

## 2018-02-01 ENCOUNTER — Encounter
Payer: No Typology Code available for payment source | Attending: Physical Medicine and Rehabilitation | Admitting: Registered Nurse

## 2018-02-01 VITALS — BP 121/81 | HR 95 | Ht 65.0 in | Wt 251.0 lb

## 2018-02-01 DIAGNOSIS — F3289 Other specified depressive episodes: Secondary | ICD-10-CM

## 2018-02-01 DIAGNOSIS — M48061 Spinal stenosis, lumbar region without neurogenic claudication: Secondary | ICD-10-CM | POA: Insufficient documentation

## 2018-02-01 DIAGNOSIS — M5126 Other intervertebral disc displacement, lumbar region: Secondary | ICD-10-CM | POA: Insufficient documentation

## 2018-02-01 DIAGNOSIS — M542 Cervicalgia: Secondary | ICD-10-CM

## 2018-02-01 DIAGNOSIS — M5416 Radiculopathy, lumbar region: Secondary | ICD-10-CM

## 2018-02-01 DIAGNOSIS — Z79899 Other long term (current) drug therapy: Secondary | ICD-10-CM

## 2018-02-01 DIAGNOSIS — G894 Chronic pain syndrome: Secondary | ICD-10-CM

## 2018-02-01 DIAGNOSIS — M62830 Muscle spasm of back: Secondary | ICD-10-CM

## 2018-02-01 DIAGNOSIS — M7061 Trochanteric bursitis, right hip: Secondary | ICD-10-CM

## 2018-02-01 DIAGNOSIS — F5101 Primary insomnia: Secondary | ICD-10-CM

## 2018-02-01 DIAGNOSIS — G834 Cauda equina syndrome: Secondary | ICD-10-CM | POA: Insufficient documentation

## 2018-02-01 DIAGNOSIS — M961 Postlaminectomy syndrome, not elsewhere classified: Secondary | ICD-10-CM

## 2018-02-01 DIAGNOSIS — F411 Generalized anxiety disorder: Secondary | ICD-10-CM

## 2018-02-01 DIAGNOSIS — M7062 Trochanteric bursitis, left hip: Secondary | ICD-10-CM

## 2018-02-01 DIAGNOSIS — Z5181 Encounter for therapeutic drug level monitoring: Secondary | ICD-10-CM

## 2018-02-01 MED ORDER — OXYCODONE-ACETAMINOPHEN 10-325 MG PO TABS: ORAL_TABLET | ORAL | 0 refills | Status: DC

## 2018-02-01 NOTE — Progress Notes (Signed)
Subjective:    Patient ID: [name], female    DOB: [birth year], 47 y.o.   MRN: [id number]  HPI: [name] is a 47 year old female who returns for follow up appointment for chronic pain and medication refill. She states her pain is located in her neck and lower back radiating into her left hip. She rates her pain 9. Her current exercise regime is walking and pool therapy at the [facility] two days a week.   Ms. Chait Morphine Equivalent is 75.00 MME. She is also prescribed Alprazolam. We have discussed the black box warning of using opioids and benzodiazepines. I highlighted the dangers of using these drugs together and discussed the adverse events including respiratory suppression, overdose, cognitive impairment and importance of compliance with current regimen. We will continue to monitor and adjust as indicated.   Last UDS was consistent on 10/04/2017.   Pain Inventory Average Pain 9 Pain Right Now 9 My pain is sharp, stabbing, tingling and aching  In the last 24 hours, has pain interfered with the following? General activity 9 Relation with others 9 Enjoyment of life 9 What TIME of day is your pain at its worst? all Sleep (in general) Poor  Pain is worse with: walking, bending, sitting and standing Pain improves with: medication Relief from Meds: 2  Mobility use a cane  Function not employed: date last employed . I need assistance with the following:  household duties and shopping  Neuro/Psych No problems in this area  Prior Studies Any changes since last visit?  no  Physicians involved in your care Any changes since last visit?  no   Family History  Problem Relation Age of Onset  . Hypertension Maternal Grandmother   . Anesthesia problems Neg Hx    Social History   Socioeconomic History  . Marital status: Divorced    Spouse name: Not on file  . Number of children: Not on file  . Years of education: Not on file  . Highest education  level: Not on file  Occupational History  . Not on file  Social Needs  . Financial resource strain: Not on file  . Food insecurity:    Worry: Not on file    Inability: Not on file  . Transportation needs:    Medical: Not on file    Non-medical: Not on file  Tobacco Use  . Smoking status: Never Smoker  . Smokeless tobacco: Never Used  Substance and Sexual Activity  . Alcohol use: No  . Drug use: No  . Sexual activity: Not Currently    Birth control/protection: None    Comment: IUD removed 2 wks ago.  Lifestyle  . Physical activity:    Days per week: Not on file    Minutes per session: Not on file  . Stress: Not on file  Relationships  . Social connections:    Talks on phone: Not on file    Gets together: Not on file    Attends religious service: Not on file    Active member of club or organization: Not on file    Attends meetings of clubs or organizations: Not on file    Relationship status: Not on file  Other Topics Concern  . Not on file  Social History Narrative  . Not on file   Past Surgical History:  Procedure Laterality Date  . ABDOMINAL HYSTERECTOMY  06/29/11   Robotic Assisted Hysterectomy  . CERVICAL SPINE SURGERY  10  .  CESAREAN SECTION  1996  . LUMBAR LAMINECTOMY  08/31/2011   Procedure: MICRODISCECTOMY LUMBAR LAMINECTOMY;  Surgeon: Tobi Bastos, MD;  [facility];  Service: Orthopedics;  Laterality: N/A;  lumbar five sacral one central microdisectomy  . MYOMECTOMY  13  . NECK SURGERY    . POSTERIOR CERVICAL FUSION/FORAMINOTOMY N/A 02/02/2013   Procedure: POSTERIOR C5-6 SPINAL FUSION;  Surgeon: Melina Schools, MD;  Location: [facility];  Service: Orthopedics;  Laterality: N/A;  . POSTERIOR FUSION CERVICAL SPINE  02/02/2013   C 5 C6     [name]  . WISDOM TOOTH EXTRACTION     Past Medical History:  Diagnosis Date  . Anemia    hx  . Arthritis   . Complication of anesthesia    slow to wake up,   very difficult to get iv had picc line last time  .  Difficult intravenous access    hands are usually best for blood draws and IV starts  . DVT (deep venous thrombosis) ([place])    ? vs lupus  . Fibroids   . Headache(784.0)   . Neck pain   . Scoliosis    LMP 04/01/2011   Opioid Risk Score:   Fall Risk Score:  `1  Depression screen PHQ 2/9  Depression screen [facility] 2/9 01/03/2018 09/07/2017 06/07/2017 05/10/2017 03/08/2017 02/03/2017 01/06/2017  Decreased Interest 0 1 1 1 1  0 1  Down, Depressed, Hopeless 0 1 1 1 1  0 1  PHQ - 2 Score 0 2 2 2 2  0 2  Altered sleeping - - - - - - -  Tired, decreased energy - - - - - - -  Change in appetite - - - - - - -  Feeling bad or failure about yourself  - - - - - - -  Trouble concentrating - - - - - - -  Moving slowly or fidgety/restless - - - - - - -  Suicidal thoughts - - - - - - -  PHQ-9 Score - - - - - - -  Difficult doing work/chores - - - - - - -     Review of Systems  Constitutional: Negative.   HENT: Negative.   Eyes: Negative.   Respiratory: Negative.   Cardiovascular: Negative.   Gastrointestinal: Negative.   Endocrine: Negative.   Genitourinary: Negative.   Musculoskeletal: Positive for arthralgias, back pain and myalgias.  Skin: Negative.   Allergic/Immunologic: Negative.   Neurological: Negative.   Hematological: Negative.   Psychiatric/Behavioral: Negative.   All other systems reviewed and are negative.      Objective:   Physical Exam  Constitutional: She is oriented to person, place, and time. She appears well-developed and well-nourished.  HENT:  Head: Normocephalic and atraumatic.  Neck: Normal range of motion. Neck supple.  Cardiovascular: Normal rate and regular rhythm.  Pulmonary/Chest: Effort normal and breath sounds normal.  Musculoskeletal:  Normal Muscle Bulk and Muscle Testing Reveals: Upper Extremities: Full ROM and Muscle Strength 5/5 Lumbar Hypersensitivity Left Greater Trochanter Tenderness Lower Extremities: Full ROM and Muscle Strength 5/5 Arises  from Table with ease Narrow Based gait    Neurological: She is alert and oriented to person, place, and time.  Skin: Skin is warm and dry.  Psychiatric: She has a normal mood and affect. Her behavior is normal.  Nursing note and vitals reviewed.         Assessment & Plan:  1. 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 Status post decompressive  laminectomy: Continue to MonitorRefilled:Oxycodone 10mg /325 mg one tablet five times a day as needed#135.02/01/2018 We will continue the opioid monitoring program,this consists of regular clinic visits, examinations, urine drug screen,pill counts as well as use of [place] Controlled Substance Reporting System. 2. Insomnia/ Anxiety: Continuecurrent medication regimen withXanax. 01/01/2018 3. Lumbar Radiculopathy/Neuropathic pain: Continuecurrent medication regimen withGabapentin.02/01/2018. 4. Muscle Spasms. Continuecurrent medication regimen with: Tizanidine. 02/01/2018 5. Neurogenic bowel and bladder.Continue to monitor.02/01/2018 6. Constipation/Opioid Therapy: Continuecurrent medication regimen with Linzess. 02/01/2018 7. Depression: Continuecurrent medication regimen withCelexa.02/01/2018 8.Left Greater Trochanteric Bursitis: Continue Heat/Ice Therapy. 02/01/2018 9. Chronic Pain Syndrome: Continuecurrent medication regimen withVoltaren.02/01/2018 10. Chronic Right Knee Pain:  No complaints today. Continue current medication regimen. 01/03/2018. 11. Cervicalgia/ Cervical Radiculitis:  No complaints today. Continue current medication regimen. Continue to Monitor. 02/01/2018  20 minutes of face to face patient care time was spent during this visit. All questions were encouraged and answered.  F/U in 1 month

## 2018-03-07 ENCOUNTER — Encounter: Payer: Self-pay | Admitting: Physical Medicine & Rehabilitation

## 2018-03-07 ENCOUNTER — Encounter
Payer: No Typology Code available for payment source | Attending: Physical Medicine and Rehabilitation | Admitting: Physical Medicine & Rehabilitation

## 2018-03-07 VITALS — BP 139/97 | HR 88 | Ht 65.0 in | Wt 248.0 lb

## 2018-03-07 DIAGNOSIS — M961 Postlaminectomy syndrome, not elsewhere classified: Secondary | ICD-10-CM

## 2018-03-07 DIAGNOSIS — M5416 Radiculopathy, lumbar region: Secondary | ICD-10-CM

## 2018-03-07 DIAGNOSIS — G834 Cauda equina syndrome: Secondary | ICD-10-CM | POA: Insufficient documentation

## 2018-03-07 DIAGNOSIS — M48061 Spinal stenosis, lumbar region without neurogenic claudication: Secondary | ICD-10-CM | POA: Insufficient documentation

## 2018-03-07 DIAGNOSIS — M5126 Other intervertebral disc displacement, lumbar region: Secondary | ICD-10-CM | POA: Insufficient documentation

## 2018-03-07 NOTE — Progress Notes (Signed)
Subjective:    Patient ID: [name], female    DOB: [birth year], 47 y.o.   MRN: [id number]  HPI   Nekita is here in follow up of her chronic pain. Her pain level remain around an 8-9/10. Pain levels increase with the cold weather.   She remains on oxycodone 10/325 q6 prn which seems to help keep her levels tolerable.   She tries to go aquatic center 3 x per week doing water aerobics. She finds this very therapeutic.   Her bowels are moving regularly with the linzess. She receives financial aid to get these.    Pain Inventory Average Pain 9 Pain Right Now 9 My pain is dull, stabbing, tingling and aching  In the last 24 hours, has pain interfered with the following? General activity 9 Relation with others 10 Enjoyment of life 9 What TIME of day is your pain at its worst? all Sleep (in general) Poor  Pain is worse with: walking, bending, sitting and standing Pain improves with: heat/ice and medication Relief from Meds: 5  Mobility use a cane  Function not employed: date last employed n/a I need assistance with the following:  household duties and shopping  Neuro/Psych No problems in this area  Prior Studies Any changes since last visit?  no  Physicians involved in your care Any changes since last visit?  no   Family History  Problem Relation Age of Onset  . Hypertension Maternal Grandmother   . Anesthesia problems Neg Hx    Social History   Socioeconomic History  . Marital status: Divorced    Spouse name: Not on file  . Number of children: Not on file  . Years of education: Not on file  . Highest education level: Not on file  Occupational History  . Not on file  Social Needs  . Financial resource strain: Not on file  . Food insecurity:    Worry: Not on file    Inability: Not on file  . Transportation needs:    Medical: Not on file    Non-medical: Not on file  Tobacco Use  . Smoking status: Never Smoker  . Smokeless tobacco: Never Used    Substance and Sexual Activity  . Alcohol use: No  . Drug use: No  . Sexual activity: Not Currently    Birth control/protection: None    Comment: IUD removed 2 wks ago.  Lifestyle  . Physical activity:    Days per week: Not on file    Minutes per session: Not on file  . Stress: Not on file  Relationships  . Social connections:    Talks on phone: Not on file    Gets together: Not on file    Attends religious service: Not on file    Active member of club or organization: Not on file    Attends meetings of clubs or organizations: Not on file    Relationship status: Not on file  Other Topics Concern  . Not on file  Social History Narrative  . Not on file   Past Surgical History:  Procedure Laterality Date  . ABDOMINAL HYSTERECTOMY  06/29/11   Robotic Assisted Hysterectomy  . CERVICAL SPINE SURGERY  [place]  . LUMBAR LAMINECTOMY  08/31/2011   Procedure: MICRODISCECTOMY LUMBAR LAMINECTOMY;  Surgeon: Tobi Bastos, MD;  Location: [facility];  Service: Orthopedics;  Laterality: N/A;  lumbar five sacral one central microdisectomy  . MYOMECTOMY  13  . NECK  SURGERY    . POSTERIOR CERVICAL FUSION/FORAMINOTOMY N/A 02/02/2013   Procedure: POSTERIOR C5-6 SPINAL FUSION;  Surgeon: Melina Schools, MD;  Location: [place];  Service: Orthopedics;  Laterality: N/A;  . POSTERIOR FUSION CERVICAL SPINE  02/02/2013   C 5 C6     [name]  . WISDOM TOOTH EXTRACTION     Past Medical History:  Diagnosis Date  . Anemia    hx  . Arthritis   . Complication of anesthesia    slow to wake up,   very difficult to get iv had picc line last time  . Difficult intravenous access    hands are usually best for blood draws and IV starts  . DVT (deep venous thrombosis) ([place])    ? vs lupus  . Fibroids   . Headache(784.0)   . Neck pain   . Scoliosis    BP (!) 139/97   Pulse 88   Ht 5\' 5"  (1.651 m)   Wt 248 lb (112.5 kg)   LMP 04/01/2011   SpO2 96%   BMI 41.27 kg/m   Opioid Risk  Score:   Fall Risk Score:  `1  Depression screen PHQ 2/9  Depression screen [facility] 2/9 03/07/2018 01/03/2018 09/07/2017 06/07/2017 05/10/2017 03/08/2017 02/03/2017  Decreased Interest 0 0 1 1 1 1  0  Down, Depressed, Hopeless 0 0 1 1 1 1  0  PHQ - 2 Score 0 0 2 2 2 2  0  Altered sleeping - - - - - - -  Tired, decreased energy - - - - - - -  Change in appetite - - - - - - -  Feeling bad or failure about yourself  - - - - - - -  Trouble concentrating - - - - - - -  Moving slowly or fidgety/restless - - - - - - -  Suicidal thoughts - - - - - - -  PHQ-9 Score - - - - - - -  Difficult doing work/chores - - - - - - -    Review of Systems  Constitutional: Negative.   HENT: Negative.   Eyes: Negative.   Respiratory: Negative.   Cardiovascular: Negative.   Gastrointestinal: Negative.   Endocrine: Negative.   Genitourinary: Negative.   Musculoskeletal: Negative.   Skin: Negative.   Allergic/Immunologic: Negative.   Neurological: Negative.   Hematological: Negative.   Psychiatric/Behavioral: Negative.   All other systems reviewed and are negative.      Objective:   Physical Exam General: No acute distress HEENT: EOMI, oral membranes moist Cards: reg rate  Chest: normal effort Abdomen: Soft, NT, ND Skin: dry, intact Extremities: no edema  Musculoskeletal:  Valgus deformities both knees.  Neuro:cane for balance. Strength nearly 5/5. Sensory exam stable. DTR's decr.  .  Skin: Skin is warm and dry.  Psychiatric: Pleasant as always       Assessment & Plan:  1. 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 Status post decompressive laminectomy:  -continue oxycodone 10/325 q6 prn #135. NO REFILLS NEEDED TODAY -We will continue the controlled substance monitoring program, this consists of regular clinic visits, examinations, routine drug screening, pill counts as well as use of [place] Controlled Substance Reporting System. [place] today.     -goal is still to decrease to 120 pills/month in 2020  -continuevoltaren 75mg  BID with food -HEP ongoing 2. Insomnia:PRN xanax at bedtime 3. Neuropathic pain: continue Gabapentinat300mg  TID--no change there  4. Muscle Spasms. Continue: Tizanidine 5. Neurogenic bowel  and bladder- Continue to monitor 6. Constipation/Opioid Therapy: Continue Linzess which is effective 7. Depression: Continue Celexa.   she is trying to stay positive. Encouraged social reintegration 8. Bilateral Greater Trochanteric Bursitis:continue HEP 9. Mild right RTC strain--improved.  64minutes of face to face patient care time was spent during this visit. All questions were encouraged and answered. Follow up with NP next month.

## 2018-03-07 NOTE — Patient Instructions (Signed)
PLEASE FEEL FREE TO CALL OUR OFFICE WITH ANY PROBLEMS OR QUESTIONS ([phone])      

## 2018-04-04 ENCOUNTER — Encounter: Payer: Self-pay | Admitting: Registered Nurse

## 2018-04-04 ENCOUNTER — Encounter
Payer: No Typology Code available for payment source | Attending: Physical Medicine and Rehabilitation | Admitting: Registered Nurse

## 2018-04-04 VITALS — BP 133/99 | HR 99 | Ht 65.0 in | Wt 248.0 lb

## 2018-04-04 DIAGNOSIS — M7061 Trochanteric bursitis, right hip: Secondary | ICD-10-CM

## 2018-04-04 DIAGNOSIS — M62830 Muscle spasm of back: Secondary | ICD-10-CM

## 2018-04-04 DIAGNOSIS — M48061 Spinal stenosis, lumbar region without neurogenic claudication: Secondary | ICD-10-CM | POA: Insufficient documentation

## 2018-04-04 DIAGNOSIS — M542 Cervicalgia: Secondary | ICD-10-CM

## 2018-04-04 DIAGNOSIS — F411 Generalized anxiety disorder: Secondary | ICD-10-CM

## 2018-04-04 DIAGNOSIS — M7062 Trochanteric bursitis, left hip: Secondary | ICD-10-CM

## 2018-04-04 DIAGNOSIS — F5101 Primary insomnia: Secondary | ICD-10-CM

## 2018-04-04 DIAGNOSIS — G894 Chronic pain syndrome: Secondary | ICD-10-CM

## 2018-04-04 DIAGNOSIS — M5126 Other intervertebral disc displacement, lumbar region: Secondary | ICD-10-CM | POA: Insufficient documentation

## 2018-04-04 DIAGNOSIS — Z5181 Encounter for therapeutic drug level monitoring: Secondary | ICD-10-CM

## 2018-04-04 DIAGNOSIS — G834 Cauda equina syndrome: Secondary | ICD-10-CM

## 2018-04-04 DIAGNOSIS — Z79899 Other long term (current) drug therapy: Secondary | ICD-10-CM

## 2018-04-04 DIAGNOSIS — M961 Postlaminectomy syndrome, not elsewhere classified: Secondary | ICD-10-CM

## 2018-04-04 DIAGNOSIS — F3289 Other specified depressive episodes: Secondary | ICD-10-CM

## 2018-04-04 DIAGNOSIS — M5416 Radiculopathy, lumbar region: Secondary | ICD-10-CM

## 2018-04-04 MED ORDER — OXYCODONE-ACETAMINOPHEN 10-325 MG PO TABS: ORAL_TABLET | ORAL | 0 refills | Status: DC

## 2018-04-04 MED ORDER — DICLOFENAC SODIUM 75 MG PO TBEC: 75.0000 mg | DELAYED_RELEASE_TABLET | Freq: Two times a day (BID) | ORAL | 3 refills | Status: DC

## 2018-04-04 MED ORDER — CITALOPRAM HYDROBROMIDE 10 MG PO TABS: 10.0000 mg | ORAL_TABLET | Freq: Every day | ORAL | 3 refills | Status: DC

## 2018-04-04 MED ORDER — GABAPENTIN 300 MG PO CAPS: ORAL_CAPSULE | ORAL | 3 refills | Status: DC

## 2018-04-04 MED ORDER — TIZANIDINE HCL 4 MG PO TABS: ORAL_TABLET | ORAL | 3 refills | Status: DC

## 2018-04-04 MED ORDER — ALPRAZOLAM 0.25 MG PO TABS: 0.2500 mg | ORAL_TABLET | Freq: Every day | ORAL | 2 refills | Status: DC

## 2018-04-04 NOTE — Progress Notes (Signed)
Subjective:    Patient ID: [name], female    DOB: March 09, 1971, 48 y.o.   MRN: [id number]  HPI: [name] is a 48 y.o. female who returns for follow up appointment for chronic pain and medication refill. She states her pain is located in her lower back radiating into her left hip. She rates her pain 10. Her current exercise regime is walking, attending water aerobics at the aquatic center and performing stretching exercises.  Ms. [place] Morphine equivalent is 75.00MME.  She is also prescribed Alprazolam.  We have discussed the black box warning of using opioids and benzodiazepines. I highlighted the dangers of using these drugs together and discussed the adverse events including respiratory suppression, overdose, cognitive impairment and importance of compliance with current regimen. We will continue to monitor and adjust as indicated.   Last UDS was Performed on 10/04/2017, it was consistent.   Pain Inventory Average Pain 9 Pain Right Now 10 My pain is constant, sharp, burning, stabbing, tingling and aching  In the last 24 hours, has pain interfered with the following? General activity 9 Relation with others 10 Enjoyment of life 9 What TIME of day is your pain at its worst? all Sleep (in general) Poor  Pain is worse with: walking, bending, sitting and standing Pain improves with: heat/ice and medication Relief from Meds: 2  Mobility walk with assistance use a cane do you drive?  no  Function not employed: date last employed .  Neuro/Psych No problems in this area  Prior Studies Any changes since last visit?  no  Physicians involved in your care Any changes since last visit?  no   Family History  Problem Relation Age of Onset  . Hypertension Maternal Grandmother   . Anesthesia problems Neg Hx    Social History   Socioeconomic History  . Marital status: Divorced    Spouse name: Not on file  . Number of children: Not on file  . Years of education:  Not on file  . Highest education level: Not on file  Occupational History  . Not on file  Social Needs  . Financial resource strain: Not on file  . Food insecurity:    Worry: Not on file    Inability: Not on file  . Transportation needs:    Medical: Not on file    Non-medical: Not on file  Tobacco Use  . Smoking status: Never Smoker  . Smokeless tobacco: Never Used  Substance and Sexual Activity  . Alcohol use: No  . Drug use: No  . Sexual activity: Not Currently    Birth control/protection: None    Comment: IUD removed 2 wks ago.  Lifestyle  . Physical activity:    Days per week: Not on file    Minutes per session: Not on file  . Stress: Not on file  Relationships  . Social connections:    Talks on phone: Not on file    Gets together: Not on file    Attends religious service: Not on file    Active member of club or organization: Not on file    Attends meetings of clubs or organizations: Not on file    Relationship status: Not on file  Other Topics Concern  . Not on file  Social History Narrative  . Not on file   Past Surgical History:  Procedure Laterality Date  . ABDOMINAL HYSTERECTOMY  06/29/11   Robotic Assisted Hysterectomy  . CERVICAL SPINE SURGERY  10  .  CESAREAN SECTION  1996  . LUMBAR LAMINECTOMY  08/31/2011   Procedure: MICRODISCECTOMY LUMBAR LAMINECTOMY;  Surgeon: Tobi Bastos, MD;  Location: [facility];  Service: Orthopedics;  Laterality: N/A;  lumbar five sacral one central microdisectomy  . MYOMECTOMY  13  . NECK SURGERY    . POSTERIOR CERVICAL FUSION/FORAMINOTOMY N/A 02/02/2013   Procedure: POSTERIOR C5-6 SPINAL FUSION;  Surgeon: Melina Schools, MD;  Location: [place];  Service: Orthopedics;  Laterality: N/A;  . POSTERIOR FUSION CERVICAL SPINE  02/02/2013   C 5 C6     [name]  . WISDOM TOOTH EXTRACTION     Past Medical History:  Diagnosis Date  . Anemia    hx  . Arthritis   . Complication of anesthesia    slow to wake up,   very difficult to get  iv had picc line last time  . Difficult intravenous access    hands are usually best for blood draws and IV starts  . DVT (deep venous thrombosis) ([place])    ? vs lupus  . Fibroids   . Headache(784.0)   . Neck pain   . Scoliosis    BP (!) 133/99   Pulse 99   Ht 5\' 5"  (1.651 m)   Wt 248 lb (112.5 kg)   LMP 04/01/2011   SpO2 98%   BMI 41.27 kg/m   Opioid Risk Score:   Fall Risk Score:  `1  Depression screen PHQ 2/9  Depression screen [facility] 2/9 03/07/2018 01/03/2018 09/07/2017 06/07/2017 05/10/2017 03/08/2017 02/03/2017  Decreased Interest 0 0 1 1 1 1  0  Down, Depressed, Hopeless 0 0 1 1 1 1  0  PHQ - 2 Score 0 0 2 2 2 2  0  Altered sleeping - - - - - - -  Tired, decreased energy - - - - - - -  Change in appetite - - - - - - -  Feeling bad or failure about yourself  - - - - - - -  Trouble concentrating - - - - - - -  Moving slowly or fidgety/restless - - - - - - -  Suicidal thoughts - - - - - - -  PHQ-9 Score - - - - - - -  Difficult doing work/chores - - - - - - -     Review of Systems  Constitutional: Negative.   HENT: Negative.   Eyes: Negative.   Respiratory: Negative.   Cardiovascular: Negative.   Gastrointestinal: Negative.   Endocrine: Negative.   Genitourinary: Negative.   Musculoskeletal: Positive for arthralgias, back pain, gait problem and myalgias.  Skin: Negative.   Allergic/Immunologic: Negative.   Hematological: Negative.   Psychiatric/Behavioral: Negative.   All other systems reviewed and are negative.      Objective:   Physical Exam Vitals signs and nursing note reviewed.  Constitutional:      Appearance: Normal appearance.  Neck:     Musculoskeletal: Normal range of motion and neck supple.  Cardiovascular:     Rate and Rhythm: Normal rate and regular rhythm.     Pulses: Normal pulses.     Heart sounds: Normal heart sounds.  Pulmonary:     Effort: Pulmonary effort is normal.     Breath sounds: Normal breath sounds.  Musculoskeletal:      Comments: Normal Muscle Bulk and Muscle Testing Reveals:  Upper Extremities: Full ROM and Muscle Strength 5/5 Lumbar Paraspinal Tenderness: L-3-L-5 Left Greater Trochanter Tenderness Lower Extremities: Full ROM and Muscle Strength 5/5 Arises  from Table Slowly using cane for support Narrow Based Gait   Skin:    General: Skin is warm and dry.  Neurological:     Mental Status: She is alert and oriented to person, place, and time.  Psychiatric:        Mood and Affect: Mood normal.        Behavior: Behavior normal.        Thought Content: Thought content normal.           Assessment & Plan:  1. 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 Status post decompressive laminectomy: Continue to MonitorRefilled:Oxycodone 10mg /325 mg one tablet five times a day as needed#135.04/04/2018 We will continue the opioid monitoring program,this consists of regular clinic visits, examinations, urine drug screen,pill counts as well as use of [place] Controlled Substance Reporting System. 2. Insomnia/ Anxiety: Continuecurrent medication regimen withXanax. 04/04/2018 3. Lumbar Radiculopathy/Neuropathic pain: Continuecurrent medication regimen withGabapentin.04/04/2018. 4. Muscle Spasms. Continuecurrent medication regimen with: Tizanidine.04/04/2018 5. Neurogenic bowel and bladder.Continue to monitor.04/04/2018 6. Constipation/Opioid Therapy: Continuecurrent medication regimen with Linzess. 04/04/2018 7. Depression: Continuecurrent medication regimen withCelexa.04/04/2018 8.LeftGreater Trochanteric Bursitis: Continue Heat/Ice Therapy. 04/04/2018 9. Chronic Pain Syndrome: Continuecurrent medication regimen withVoltaren.04/04/2018 10. Chronic Right Knee Pain:No complaints today.Continue current medication regimen.04/04/2018 11. Cervicalgia/ Cervical Radiculitis:  No complaints today. Continue current medication regimen. Continue to  Monitor.04/04/2018  20 minutes of face to face patient care time was spent during this visit. All questions were encouraged and answered.  F/U in 1 month

## 2018-05-03 ENCOUNTER — Other Ambulatory Visit: Payer: Self-pay

## 2018-05-03 ENCOUNTER — Encounter
Payer: No Typology Code available for payment source | Attending: Physical Medicine and Rehabilitation | Admitting: Registered Nurse

## 2018-05-03 ENCOUNTER — Encounter: Payer: Self-pay | Admitting: Registered Nurse

## 2018-05-03 VITALS — BP 138/90 | HR 118 | Ht 65.0 in | Wt 251.2 lb

## 2018-05-03 DIAGNOSIS — G894 Chronic pain syndrome: Secondary | ICD-10-CM

## 2018-05-03 DIAGNOSIS — M7061 Trochanteric bursitis, right hip: Secondary | ICD-10-CM

## 2018-05-03 DIAGNOSIS — G834 Cauda equina syndrome: Secondary | ICD-10-CM | POA: Insufficient documentation

## 2018-05-03 DIAGNOSIS — Z5181 Encounter for therapeutic drug level monitoring: Secondary | ICD-10-CM

## 2018-05-03 DIAGNOSIS — M5126 Other intervertebral disc displacement, lumbar region: Secondary | ICD-10-CM | POA: Insufficient documentation

## 2018-05-03 DIAGNOSIS — F3289 Other specified depressive episodes: Secondary | ICD-10-CM

## 2018-05-03 DIAGNOSIS — M48061 Spinal stenosis, lumbar region without neurogenic claudication: Secondary | ICD-10-CM | POA: Insufficient documentation

## 2018-05-03 DIAGNOSIS — M62830 Muscle spasm of back: Secondary | ICD-10-CM

## 2018-05-03 DIAGNOSIS — F411 Generalized anxiety disorder: Secondary | ICD-10-CM

## 2018-05-03 DIAGNOSIS — M961 Postlaminectomy syndrome, not elsewhere classified: Secondary | ICD-10-CM

## 2018-05-03 DIAGNOSIS — M5416 Radiculopathy, lumbar region: Secondary | ICD-10-CM

## 2018-05-03 DIAGNOSIS — Z79899 Other long term (current) drug therapy: Secondary | ICD-10-CM

## 2018-05-03 DIAGNOSIS — M542 Cervicalgia: Secondary | ICD-10-CM

## 2018-05-03 DIAGNOSIS — M7062 Trochanteric bursitis, left hip: Secondary | ICD-10-CM

## 2018-05-03 DIAGNOSIS — F5101 Primary insomnia: Secondary | ICD-10-CM

## 2018-05-03 MED ORDER — OXYCODONE-ACETAMINOPHEN 10-325 MG PO TABS: ORAL_TABLET | ORAL | 0 refills | Status: DC

## 2018-05-03 NOTE — Progress Notes (Signed)
Subjective:    Patient ID: [name], female    DOB: Aug 17, 1970, 48 y.o.   MRN: [id number]  HPI: [name] is a 48 y.o. female who returns for follow up appointment for chronic pain and medication refill. She states her  pain is located in her neck, lower back radiating into her left hip and left lower extremity. Also reports right knee pain. He rates his pain 10. Her current exercise regime is walking, [facility] and performing stretching exercises.  [name] Morphine equivalent is 75.00  MME. She is also prescribed Alprazolam. We have discussed the black box warning of using opioids and benzodiazepines. I highlighted the dangers of using these drugs together and discussed the adverse events including respiratory suppression, overdose, cognitive impairment and importance of compliance with current regimen. We will continue to monitor and adjust as indicated.   Last UDS was Performed on 10/04/2017, it was consistent.    Pain Inventory Average Pain 9 Pain Right Now 10 My pain is sharp, dull and aching  In the last 24 hours, has pain interfered with the following? General activity 9 Relation with others 9 Enjoyment of life 9 What TIME of day is your pain at its worst? all Sleep (in general) Poor  Pain is worse with: walking, bending, sitting and standing Pain improves with: medication Relief from Meds: 1  Mobility use a cane ability to climb steps?  no do you drive?  no  Function not employed: date last employed . I need assistance with the following:  household duties and shopping  Neuro/Psych trouble walking  Prior Studies Any changes since last visit?  no  Physicians involved in your care Any changes since last visit?  no   Family History  Problem Relation Age of Onset  . Hypertension Maternal Grandmother   . Anesthesia problems Neg Hx    Social History   Socioeconomic History  . Marital status: Divorced   Spouse name: Not on file  . Number of children: Not on file  . Years of education: Not on file  . Highest education level: Not on file  Occupational History  . Not on file  Social Needs  . Financial resource strain: Not on file  . Food insecurity:    Worry: Not on file    Inability: Not on file  . Transportation needs:    Medical: Not on file    Non-medical: Not on file  Tobacco Use  . Smoking status: Never Smoker  . Smokeless tobacco: Never Used  Substance and Sexual Activity  . Alcohol use: No  . Drug use: No  . Sexual activity: Not Currently    Birth control/protection: None    Comment: IUD removed 2 wks ago.  Lifestyle  . Physical activity:    Days per week: Not on file    Minutes per session: Not on file  . Stress: Not on file  Relationships  . Social connections:    Talks on phone: Not on file    Gets together: Not on file    Attends religious service: Not on file    Active member of club or organization: Not on file    Attends meetings of clubs or organizations: Not on file    Relationship status: Not on file  Other Topics Concern  . Not on file  Social History Narrative  . Not on file   Past Surgical History:  Procedure Laterality Date  .  ABDOMINAL HYSTERECTOMY  06/29/11   Robotic Assisted Hysterectomy  . CERVICAL SPINE SURGERY  [place]  . LUMBAR LAMINECTOMY  08/31/2011   Procedure: MICRODISCECTOMY LUMBAR LAMINECTOMY;  Surgeon: Tobi Bastos, MD;  [facility];  Service: Orthopedics;  Laterality: N/A;  lumbar five sacral one central microdisectomy  . MYOMECTOMY  13  . NECK SURGERY    . POSTERIOR CERVICAL FUSION/FORAMINOTOMY N/A 02/02/2013   Procedure: POSTERIOR C5-6 SPINAL FUSION;  Surgeon: Melina Schools, MD;  Location: [place];  Service: Orthopedics;  Laterality: N/A;  . POSTERIOR FUSION CERVICAL SPINE  02/02/2013   C 5 C6     [name]  . WISDOM TOOTH EXTRACTION     Past Medical History:  Diagnosis Date  . Anemia    hx  .  Arthritis   . Complication of anesthesia    slow to wake up,   very difficult to get iv had picc line last time  . Difficult intravenous access    hands are usually best for blood draws and IV starts  . DVT (deep venous thrombosis) ([place])    ? vs lupus  . Fibroids   . Headache(784.0)   . Neck pain   . Scoliosis    BP 138/90   Pulse (!) 118   Ht 5\' 5"  (1.651 m)   Wt 251 lb 3.2 oz (113.9 kg)   LMP 04/01/2011   SpO2 94%   BMI 41.80 kg/m   Opioid Risk Score:   Fall Risk Score:  `1  Depression screen PHQ 2/9  Depression screen [facility] 2/9 05/03/2018 03/07/2018 01/03/2018 09/07/2017 06/07/2017 05/10/2017 03/08/2017  Decreased Interest 0 0 0 1 1 1 1   Down, Depressed, Hopeless 0 0 0 1 1 1 1   PHQ - 2 Score 0 0 0 2 2 2 2   Altered sleeping - - - - - - -  Tired, decreased energy - - - - - - -  Change in appetite - - - - - - -  Feeling bad or failure about yourself  - - - - - - -  Trouble concentrating - - - - - - -  Moving slowly or fidgety/restless - - - - - - -  Suicidal thoughts - - - - - - -  PHQ-9 Score - - - - - - -  Difficult doing work/chores - - - - - - -    Review of Systems  Constitutional: Positive for diaphoresis.  HENT: Negative.   Eyes: Negative.   Respiratory: Negative.   Cardiovascular: Negative.   Gastrointestinal: Negative.   Endocrine: Negative.   Genitourinary: Negative.   Musculoskeletal: Positive for back pain and gait problem.       R knee pain  Skin: Negative.   Allergic/Immunologic: Negative.   Hematological: Negative.   Psychiatric/Behavioral: Negative.   All other systems reviewed and are negative.      Objective:   Physical Exam Vitals signs and nursing note reviewed.  Constitutional:      Appearance: Normal appearance.  Neck:     Musculoskeletal: Normal range of motion and neck supple.     Comments: Cervical Paraspinal Tenderness: C-5-C-6 Cardiovascular:     Rate and Rhythm: Normal rate and regular rhythm.     Pulses: Normal pulses.      Heart sounds: Normal heart sounds.  Pulmonary:     Effort: Pulmonary effort is normal.     Breath sounds: Normal breath sounds.  Musculoskeletal:  Comments: Normal Muscle Bulk and Muscle Testing Reveals:  Upper Extremities: Full  ROM and Muscle Strength 5/5 Bilateral AC Joint Tenderness Lumbar Paraspinal Tenderness: L-3-L-5 Left Greater Trochanter Tenderness Lower Extremities: Right: Decreased ROM  And Muscle Strength 5/5 Right Lower Extremity Flexion Produces Pain into Patella Left Lower Extremity: Full ROM and Muscle Strength 5/5 Arises from Table Slowly using Cane for support Narrow Based Gait   Skin:    General: Skin is warm and dry.  Neurological:     Mental Status: She is alert and oriented to person, place, and time.  Psychiatric:        Mood and Affect: Mood normal.        Behavior: Behavior normal.           Assessment & Plan:  1. 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 Status post decompressive laminectomy: Continue to MonitorRefilled:Oxycodone 10mg /325 mg one tablet five times a day as needed#135.05/03/2018 We will continue the opioid monitoring program,this consists of regular clinic visits, examinations, urine drug screen,pill counts as well as use of [place] Controlled Substance Reporting System. 2. Insomnia/ Anxiety: Continuecurrent medication regimen withXanax. 05/03/2018 3. Lumbar Radiculopathy/Neuropathic pain: Continuecurrent medication regimen withGabapentin.05/03/2018. 4. Muscle Spasms. Continuecurrent medication regimen with: Tizanidine.05/03/2018 5. Neurogenic bowel and bladder.Continue to monitor.05/03/2018 6. Constipation/Opioid Therapy: Continuecurrent medication regimen with Linzess. 05/03/2018 7. Depression: Continuecurrent medication regimen withCelexa.05/03/2018 8.LeftGreater Trochanteric Bursitis: Continue Heat/Ice Therapy. 05/03/2018 9. Chronic Pain Syndrome: Continuecurrent  medication regimen withVoltaren.04/04/2018 10. Chronic Right Knee Pain:No complaints today.Continue current medication regimen.04/04/2018 11. Cervicalgia/ Cervical Radiculitis:Continue current medication regimen and HEP as tolerated.. Continue to Monitor.05/03/2018  20 minutes of face to face patient care time was spent during this visit. All questions were encouraged and answered.  F/U in 1 month

## 2018-06-01 ENCOUNTER — Other Ambulatory Visit: Payer: Self-pay

## 2018-06-01 ENCOUNTER — Encounter: Payer: Self-pay | Admitting: Registered Nurse

## 2018-06-01 ENCOUNTER — Encounter
Payer: No Typology Code available for payment source | Attending: Physical Medicine and Rehabilitation | Admitting: Registered Nurse

## 2018-06-01 VITALS — BP 137/84 | HR 96 | Ht 65.0 in | Wt 250.0 lb

## 2018-06-01 DIAGNOSIS — Z79899 Other long term (current) drug therapy: Secondary | ICD-10-CM

## 2018-06-01 DIAGNOSIS — G834 Cauda equina syndrome: Secondary | ICD-10-CM | POA: Insufficient documentation

## 2018-06-01 DIAGNOSIS — M5412 Radiculopathy, cervical region: Secondary | ICD-10-CM

## 2018-06-01 DIAGNOSIS — M542 Cervicalgia: Secondary | ICD-10-CM

## 2018-06-01 DIAGNOSIS — F5101 Primary insomnia: Secondary | ICD-10-CM

## 2018-06-01 DIAGNOSIS — M7061 Trochanteric bursitis, right hip: Secondary | ICD-10-CM

## 2018-06-01 DIAGNOSIS — F411 Generalized anxiety disorder: Secondary | ICD-10-CM

## 2018-06-01 DIAGNOSIS — G894 Chronic pain syndrome: Secondary | ICD-10-CM

## 2018-06-01 DIAGNOSIS — M5126 Other intervertebral disc displacement, lumbar region: Secondary | ICD-10-CM | POA: Insufficient documentation

## 2018-06-01 DIAGNOSIS — M62830 Muscle spasm of back: Secondary | ICD-10-CM

## 2018-06-01 DIAGNOSIS — G8929 Other chronic pain: Secondary | ICD-10-CM

## 2018-06-01 DIAGNOSIS — Z5181 Encounter for therapeutic drug level monitoring: Secondary | ICD-10-CM

## 2018-06-01 DIAGNOSIS — F3289 Other specified depressive episodes: Secondary | ICD-10-CM

## 2018-06-01 DIAGNOSIS — M48061 Spinal stenosis, lumbar region without neurogenic claudication: Secondary | ICD-10-CM | POA: Insufficient documentation

## 2018-06-01 DIAGNOSIS — M961 Postlaminectomy syndrome, not elsewhere classified: Secondary | ICD-10-CM

## 2018-06-01 DIAGNOSIS — K0889 Other specified disorders of teeth and supporting structures: Secondary | ICD-10-CM

## 2018-06-01 DIAGNOSIS — M545 Low back pain: Secondary | ICD-10-CM

## 2018-06-01 MED ORDER — OXYCODONE-ACETAMINOPHEN 10-325 MG PO TABS: ORAL_TABLET | ORAL | 0 refills | Status: DC

## 2018-06-01 NOTE — Progress Notes (Signed)
Subjective:    Patient ID: [name], female    DOB: Jul 16, 1970, 48 y.o.   MRN: [id number]  HPI: [name] is a 48 y.o. female who returns for follow up appointment for chronic pain and medication refill. She states she's having dental pain in the process of obtaining a dental appointment, also neck pain radiating into her bilateral shoulders and lower back pain. She rates her  Pain 9. Her current exercise regime is walking and attending the [place] three dys a week.   Ms. Friday Morphine equivalent is 75.00  MME. She  is also prescribed Alprazolam. We have discussed the black box warning of using opioids and benzodiazepines. I highlighted the dangers of using these drugs together and discussed the adverse events including respiratory suppression, overdose, cognitive impairment and importance of compliance with current regimen. We will continue to monitor and adjust as indicated.   Last UDs was Performed on 10/04/2017, it was consistent. UDS ordered today.    Pain Inventory Average Pain 9 Pain Right Now 9 My pain is sharp, burning, dull, tingling and aching  In the last 24 hours, has pain interfered with the following? General activity 9 Relation with others 9 Enjoyment of life 9 What TIME of day is your pain at its worst? all Sleep (in general) Poor  Pain is worse with: walking, bending, sitting, inactivity and standing Pain improves with: medication Relief from Meds: 2  Mobility use a cane do you drive?  no  Function not employed: date last employed 2013  Neuro/Psych No problems in this area  Prior Studies Any changes since last visit?  no  Physicians involved in your care Any changes since last visit?  no   Family History  Problem Relation Age of Onset  . Hypertension Maternal Grandmother   . Anesthesia problems Neg Hx    Social History   Socioeconomic History  . Marital status: Divorced    Spouse name: Not on file  . Number of children:  Not on file  . Years of education: Not on file  . Highest education level: Not on file  Occupational History  . Not on file  Social Needs  . Financial resource strain: Not on file  . Food insecurity:    Worry: Not on file    Inability: Not on file  . Transportation needs:    Medical: Not on file    Non-medical: Not on file  Tobacco Use  . Smoking status: Never Smoker  . Smokeless tobacco: Never Used  Substance and Sexual Activity  . Alcohol use: No  . Drug use: No  . Sexual activity: Not Currently    Birth control/protection: None    Comment: IUD removed 2 wks ago.  Lifestyle  . Physical activity:    Days per week: Not on file    Minutes per session: Not on file  . Stress: Not on file  Relationships  . Social connections:    Talks on phone: Not on file    Gets together: Not on file    Attends religious service: Not on file    Active member of club or organization: Not on file    Attends meetings of clubs or organizations: Not on file    Relationship status: Not on file  Other Topics Concern  . Not on file  Social History Narrative  . Not on file   Past Surgical History:  Procedure Laterality Date  . ABDOMINAL HYSTERECTOMY  06/29/11   Robotic  Assisted Hysterectomy  . CERVICAL SPINE SURGERY  [place]  . LUMBAR LAMINECTOMY  08/31/2011   Procedure: MICRODISCECTOMY LUMBAR LAMINECTOMY;  Surgeon: Brooke Bastos, MD;  Location: [facility];  Service: Orthopedics;  Laterality: N/A;  lumbar five sacral one central microdisectomy  . MYOMECTOMY  13  . NECK SURGERY    . POSTERIOR CERVICAL FUSION/FORAMINOTOMY N/A 02/02/2013   Procedure: POSTERIOR C5-6 SPINAL FUSION;  Surgeon: Brooke Schools, MD;  Location: [facility];  Service: Orthopedics;  Laterality: N/A;  . POSTERIOR FUSION CERVICAL SPINE  02/02/2013   C 5 C6     Dr [name]  . WISDOM TOOTH EXTRACTION     Past Medical History:  Diagnosis Date  . Anemia    hx  . Arthritis   . Complication of anesthesia    slow  to wake up,   very difficult to get iv had picc line last time  . Difficult intravenous access    hands are usually best for blood draws and IV starts  . DVT (deep venous thrombosis) ([place])    ? vs lupus  . Fibroids   . Headache(784.0)   . Neck pain   . Scoliosis    BP 137/84   Pulse 96   Ht 5\' 5"  (1.651 m)   Wt 250 lb (113.4 kg)   LMP 04/01/2011   SpO2 97%   BMI 41.60 kg/m   Opioid Risk Score:   Fall Risk Score:  `1  Depression screen PHQ 2/9  Depression screen [facility] 2/9 05/03/2018 03/07/2018 01/03/2018 09/07/2017 06/07/2017 05/10/2017 03/08/2017  Decreased Interest 0 0 0 1 1 1 1   Down, Depressed, Hopeless 0 0 0 1 1 1 1   PHQ - 2 Score 0 0 0 2 2 2 2   Altered sleeping - - - - - - -  Tired, decreased energy - - - - - - -  Change in appetite - - - - - - -  Feeling bad or failure about yourself  - - - - - - -  Trouble concentrating - - - - - - -  Moving slowly or fidgety/restless - - - - - - -  Suicidal thoughts - - - - - - -  PHQ-9 Score - - - - - - -  Difficult doing work/chores - - - - - - -     Review of Systems  Constitutional: Negative.   HENT: Positive for dental problem.   Eyes: Negative.   Respiratory: Negative.   Cardiovascular: Negative.   Gastrointestinal: Negative.   Endocrine: Negative.   Genitourinary: Negative.   Musculoskeletal: Positive for arthralgias, gait problem and myalgias.  Skin: Negative.   Allergic/Immunologic: Negative.   Hematological: Negative.   Psychiatric/Behavioral: Negative.   All other systems reviewed and are negative.      Objective:   Physical Exam Vitals signs and nursing note reviewed.  Constitutional:      Appearance: Normal appearance.  Neck:     Musculoskeletal: Normal range of motion and neck supple.  Cardiovascular:     Rate and Rhythm: Normal rate and regular rhythm.     Pulses: Normal pulses.     Heart sounds: Normal heart sounds.  Pulmonary:     Effort: Pulmonary effort is normal.     Breath sounds: Normal  breath sounds.  Musculoskeletal:     Comments: Normal Muscle Bulk and Muscle Testing Reveals:  Upper Extremities: Full ROM and Muscle Strength 5/5 Bilateral AC Joint Tenderness  Lumbar  Paraspinal Tenderness: L-4-L-5 Lower Extremities: Full ROM and Muscle Strength 5/5 Arises from Table slowly using cane for support Narrow Based  Gait   Skin:    General: Skin is warm and dry.  Neurological:     Mental Status: She is alert and oriented to person, place, and time.  Psychiatric:        Mood and Affect: Mood normal.        Behavior: Behavior normal.           Assessment & Plan:  1. 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 Status post decompressive laminectomy: Continue to MonitorRefilled:Oxycodone 10mg /325 mg one tablet five times a day as needed#135.06/01/2018 We will continue the opioid monitoring program,this consists of regular clinic visits, examinations, urine drug screen,pill counts as well as use of [place] Controlled Substance Reporting System. 2. Insomnia/ Anxiety: Continuecurrent medication regimen withXanax. 06/01/2018 3. Lumbar Radiculopathy/Neuropathic pain: Continuecurrent medication regimen withGabapentin.06/01/2018. 4. Muscle Spasms. Continuecurrent medication regimen with: Tizanidine.06/01/2018 5. Neurogenic bowel and bladder.Continue to monitor.06/01/2018 6. Constipation/Opioid Therapy: Continuecurrent medication regimen with Linzess. 06/01/2018 7. Depression: Continuecurrent medication regimen withCelexa.06/01/2018 8.LeftGreater Trochanteric Bursitis: Continue Heat/Ice Therapy. 06/01/2018 9. Chronic Pain Syndrome: Continuecurrent medication regimen withVoltaren.06/01/2018 10. Chronic Right Knee Pain:No complaints today.Continue current medication regimen.06/01/2018 11. Cervicalgia/ Cervical Radiculitis:Continue current medication regimen and HEP as tolerated.. Continue to Monitor.06/01/2018 12.  Dental Pain: Ms. Lykins in the process of scheduling a dental appointment.   20 minutes of face to face patient care time was spent during this visit. All questions were encouraged and answered.  F/U in 1 month

## 2018-06-01 NOTE — Addendum Note (Signed)
Addended by: Marland Mcalpine B on: 06/01/2018 12:25 PM   Modules accepted: Orders

## 2018-06-07 LAB — DRUG TOX MONITOR 1 W/CONF, ORAL FLD
Amphetamines: NEGATIVE ng/mL (ref <10)
Barbiturates: NEGATIVE ng/mL (ref <10)
Benzodiazepines: NEGATIVE ng/mL (ref <0.50)
Buprenorphine: NEGATIVE ng/mL (ref <0.10)
Cocaine: NEGATIVE ng/mL (ref <5.0)
Codeine: NEGATIVE ng/mL (ref <2.5)
Dihydrocodeine: NEGATIVE ng/mL (ref <2.5)
Fentanyl: NEGATIVE ng/mL (ref <0.10)
Heroin Metabolite: NEGATIVE ng/mL (ref <1.0)
Hydrocodone: NEGATIVE ng/mL (ref <2.5)
Hydromorphone: NEGATIVE ng/mL (ref <2.5)
MARIJUANA: NEGATIVE ng/mL (ref <2.5)
MDMA: NEGATIVE ng/mL (ref <10)
Meprobamate: NEGATIVE ng/mL (ref <2.5)
Methadone: NEGATIVE ng/mL (ref <5.0)
Morphine: NEGATIVE ng/mL (ref <2.5)
Nicotine Metabolite: NEGATIVE ng/mL (ref <5.0)
Norhydrocodone: NEGATIVE ng/mL (ref <2.5)
Noroxycodone: 8.2 ng/mL — ABNORMAL HIGH (ref <2.5)
OPIATES: POSITIVE ng/mL — AB (ref <2.5)
OXYCODONE: 51.3 ng/mL — AB (ref <2.5)
OXYMORPHONE: NEGATIVE ng/mL (ref <2.5)
Phencyclidine: NEGATIVE ng/mL (ref <10)
Tapentadol: NEGATIVE ng/mL (ref <5.0)
Tramadol: NEGATIVE ng/mL (ref <5.0)
Zolpidem: NEGATIVE ng/mL (ref <5.0)

## 2018-06-07 LAB — DRUG TOX ALC METAB W/CON, ORAL FLD: Alcohol Metabolite: NEGATIVE ng/mL (ref <25)

## 2018-06-08 ENCOUNTER — Telehealth: Payer: Self-pay | Admitting: *Deleted

## 2018-06-08 NOTE — Telephone Encounter (Signed)
Oral swab drug screen was consistent for prescribed medications.  ?

## 2018-06-29 ENCOUNTER — Other Ambulatory Visit: Payer: Self-pay

## 2018-06-29 ENCOUNTER — Encounter
Payer: No Typology Code available for payment source | Attending: Physical Medicine and Rehabilitation | Admitting: Registered Nurse

## 2018-06-29 VITALS — Ht 65.0 in | Wt 250.0 lb

## 2018-06-29 DIAGNOSIS — M545 Low back pain, unspecified: Secondary | ICD-10-CM

## 2018-06-29 DIAGNOSIS — M961 Postlaminectomy syndrome, not elsewhere classified: Secondary | ICD-10-CM

## 2018-06-29 DIAGNOSIS — Z5181 Encounter for therapeutic drug level monitoring: Secondary | ICD-10-CM

## 2018-06-29 DIAGNOSIS — F3289 Other specified depressive episodes: Secondary | ICD-10-CM

## 2018-06-29 DIAGNOSIS — G834 Cauda equina syndrome: Secondary | ICD-10-CM | POA: Insufficient documentation

## 2018-06-29 DIAGNOSIS — Z79899 Other long term (current) drug therapy: Secondary | ICD-10-CM

## 2018-06-29 DIAGNOSIS — G894 Chronic pain syndrome: Secondary | ICD-10-CM

## 2018-06-29 DIAGNOSIS — F411 Generalized anxiety disorder: Secondary | ICD-10-CM

## 2018-06-29 DIAGNOSIS — M7061 Trochanteric bursitis, right hip: Secondary | ICD-10-CM

## 2018-06-29 DIAGNOSIS — M48061 Spinal stenosis, lumbar region without neurogenic claudication: Secondary | ICD-10-CM | POA: Insufficient documentation

## 2018-06-29 DIAGNOSIS — M5126 Other intervertebral disc displacement, lumbar region: Secondary | ICD-10-CM | POA: Insufficient documentation

## 2018-06-29 DIAGNOSIS — F5101 Primary insomnia: Secondary | ICD-10-CM

## 2018-06-29 DIAGNOSIS — G8929 Other chronic pain: Secondary | ICD-10-CM

## 2018-06-29 DIAGNOSIS — M62830 Muscle spasm of back: Secondary | ICD-10-CM

## 2018-06-29 NOTE — Progress Notes (Signed)
Subjective:    Patient ID: [name], female    DOB: 1970-05-29, 48 y.o.   MRN: [id number]  HPI: [name] is a 48 y.o. female her appointment was changed , due to national recommendations of social distancing due to [place] 19, an audio/video telehealth visit is felt to be most appropriate for this patient at this time.  See Chart message from today for the patient's consent to telehealth from [place].    She states her pain is located in her lower back and right knee. She rates her  pain 8. Her current exercise regime is walking and performing stretching exercises. [name] Morphine equivalent is 75.00 MME. She is also prescribed Alprazolam. We have discussed the black box warning of using opioids and benzodiazepines. I highlighted the dangers of using these drugs together and discussed the adverse events including respiratory suppression, overdose, cognitive impairment and importance of compliance with current regimen. We will continue to monitor and adjust as indicated.    Last Oral Swab was Performed on 06/01/2018, it was consistent.   Kennon Rounds CMA asked the Health and History Questions. This provider and Kennon Rounds verified we were speaking with the correct person using two identifiers.   Pain Inventory Average Pain 8 Pain Right Now 8 My pain is sharp, dull, stabbing, tingling and aching  In the last 24 hours, has pain interfered with the following? General activity 8 Relation with others 8 Enjoyment of life 8 What TIME of day is your pain at its worst? all Sleep (in general) Poor  Pain is worse with: walking, bending, sitting, standing and some activites Pain improves with: rest, heat/ice, therapy/exercise and medication Relief from Meds: 3  Mobility walk with assistance use a cane how many minutes can you walk? 4 ability to climb steps?  yes do you drive?  no  Function disabled: date disabled na I need assistance  with the following:  household duties and shopping  Neuro/Psych bowel control problems trouble walking spasms  Prior Studies Any changes since last visit?  no  Physicians involved in your care Any changes since last visit?  no   Family History  Problem Relation Age of Onset   Hypertension Maternal Grandmother    Anesthesia problems Neg Hx    Social History   Socioeconomic History   Marital status: Divorced    Spouse name: Not on file   Number of children: Not on file   Years of education: Not on file   Highest education level: Not on file  Occupational History   Not on file  Social Needs   Financial resource strain: Not on file   Food insecurity:    Worry: Not on file    Inability: Not on file   Transportation needs:    Medical: Not on file    Non-medical: Not on file  Tobacco Use   Smoking status: Never Smoker   Smokeless tobacco: Never Used  Substance and Sexual Activity   Alcohol use: No   Drug use: No   Sexual activity: Not Currently    Birth control/protection: None    Comment: IUD removed 2 wks ago.  Lifestyle   Physical activity:    Days per week: Not on file    Minutes per session: Not on file   Stress: Not on file  Relationships   Social connections:    Talks on phone: Not on file    Gets together: Not on file  Attends religious service: Not on file    Active member of club or organization: Not on file    Attends meetings of clubs or organizations: Not on file    Relationship status: Not on file  Other Topics Concern   Not on file  Social History Narrative   Not on file   Past Surgical History:  Procedure Laterality Date   ABDOMINAL HYSTERECTOMY  06/29/11   Robotic Assisted Hysterectomy   CERVICAL SPINE SURGERY  [place]  08/31/2011   Procedure: MICRODISCECTOMY LUMBAR LAMINECTOMY;  Surgeon: Tobi Bastos, MD;  [facility];  Service: Orthopedics;  Laterality: N/A;   lumbar five sacral one central microdisectomy   MYOMECTOMY  13   NECK SURGERY     POSTERIOR CERVICAL FUSION/FORAMINOTOMY N/A 02/02/2013   Procedure: POSTERIOR C5-6 SPINAL FUSION;  Surgeon: Melina Schools, MD;  Location: [place];  Service: Orthopedics;  Laterality: N/A;   POSTERIOR FUSION CERVICAL SPINE  02/02/2013   C 5 C6     [name]   WISDOM TOOTH EXTRACTION     Past Medical History:  Diagnosis Date   Anemia    hx   Arthritis    Complication of anesthesia    slow to wake up,   very difficult to get iv had picc line last time   Difficult intravenous access    hands are usually best for blood draws and IV starts   DVT (deep venous thrombosis) ([place])    ? vs lupus   Fibroids    Headache(784.0)    Neck pain    Scoliosis    Ht 5\' 5"  (1.651 m)    Wt 250 lb (113.4 kg)    LMP 04/01/2011    BMI 41.60 kg/m   Opioid Risk Score:   Fall Risk Score:  `1  Depression screen PHQ 2/9  Depression screen [facility] 2/9 06/29/2018 05/03/2018 03/07/2018 01/03/2018 09/07/2017 06/07/2017 05/10/2017  Decreased Interest 0 0 0 0 1 1 1   Down, Depressed, Hopeless 0 0 0 0 1 1 1   PHQ - 2 Score 0 0 0 0 2 2 2   Altered sleeping - - - - - - -  Tired, decreased energy - - - - - - -  Change in appetite - - - - - - -  Feeling bad or failure about yourself  - - - - - - -  Trouble concentrating - - - - - - -  Moving slowly or fidgety/restless - - - - - - -  Suicidal thoughts - - - - - - -  PHQ-9 Score - - - - - - -  Difficult doing work/chores - - - - - - -    Review of Systems  Constitutional: Positive for diaphoresis.  HENT: Negative.   Eyes: Negative.   Respiratory: Negative.   Cardiovascular: Negative.   Gastrointestinal: Positive for abdominal pain, constipation and nausea.  Endocrine: Negative.   Genitourinary: Negative.   Musculoskeletal: Positive for gait problem.       Spasms  Skin: Negative.   Allergic/Immunologic: Negative.   Hematological: Negative.   Psychiatric/Behavioral:  Negative.   All other systems reviewed and are negative.      Objective:   Physical Exam Vitals signs and nursing note reviewed.  Musculoskeletal:     Comments: No Physical Exam: Virtual Visit  Neurological:     Mental Status: She is oriented to person, place, and time.  Assessment & Plan:  1. 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 Status post decompressive laminectomy:Chronic Low Back Pain without Sciatica:  Continue to MonitorRefilled:Oxycodone 10mg /325 mg one tablet five times a day as needed#135.06/29/2018 We will continue the opioid monitoring program,this consists of regular clinic visits, examinations, urine drug screen,pill counts as well as use of [place] Controlled Substance Reporting System. 2. Insomnia/ Anxiety: Continuecurrent medication regimen withXanax and Tizanidine. 06/29/2018 3. Lumbar Radiculopathy/Neuropathic pain: Continuecurrent medication regimen withGabapentin.06/29/2018. 4. Muscle Spasms. Continuecurrent medication regimen with: Tizanidine.06/29/2018 5. Neurogenic bowel and bladder.Continue to monitor.06/29/2018 6. Constipation/Opioid Therapy: Continuecurrent medication regimen with Linzess. 06/29/2018 7. Depression: Continuecurrent medication regimen withCelexa.06/29/2018 8.LeftGreater Trochanteric Bursitis: Continue Heat/Ice Therapy. 06/29/2018 9. Chronic Pain Syndrome: Continuecurrent medication regimen withVoltaren.06/29/2018 10. Chronic Right Knee Pain:Continue current medication regimen.06/29/2018 11. Cervicalgia/ Cervical Radiculitis:Continue current medication regimenand HEP as tolerated.. Continue to Monitor.06/29/2018  F/U in 1 month    [place] Location of patient: Her home  Location of provider: Office Established patient Time spent on call: 15 minutes

## 2018-06-30 MED ORDER — OXYCODONE-ACETAMINOPHEN 10-325 MG PO TABS: ORAL_TABLET | ORAL | 0 refills | Status: DC

## 2018-07-03 ENCOUNTER — Encounter: Payer: Self-pay | Admitting: Registered Nurse

## 2018-07-27 ENCOUNTER — Encounter: Payer: Self-pay | Admitting: Registered Nurse

## 2018-07-27 ENCOUNTER — Other Ambulatory Visit: Payer: Self-pay

## 2018-07-27 ENCOUNTER — Encounter
Payer: No Typology Code available for payment source | Attending: Physical Medicine and Rehabilitation | Admitting: Registered Nurse

## 2018-07-27 VITALS — Ht 65.0 in | Wt 250.0 lb

## 2018-07-27 DIAGNOSIS — M48061 Spinal stenosis, lumbar region without neurogenic claudication: Secondary | ICD-10-CM | POA: Insufficient documentation

## 2018-07-27 DIAGNOSIS — G8929 Other chronic pain: Secondary | ICD-10-CM

## 2018-07-27 DIAGNOSIS — M7061 Trochanteric bursitis, right hip: Secondary | ICD-10-CM

## 2018-07-27 DIAGNOSIS — G834 Cauda equina syndrome: Secondary | ICD-10-CM | POA: Insufficient documentation

## 2018-07-27 DIAGNOSIS — M961 Postlaminectomy syndrome, not elsewhere classified: Secondary | ICD-10-CM

## 2018-07-27 DIAGNOSIS — Z5181 Encounter for therapeutic drug level monitoring: Secondary | ICD-10-CM

## 2018-07-27 DIAGNOSIS — M545 Low back pain: Secondary | ICD-10-CM

## 2018-07-27 DIAGNOSIS — G894 Chronic pain syndrome: Secondary | ICD-10-CM

## 2018-07-27 DIAGNOSIS — F5101 Primary insomnia: Secondary | ICD-10-CM

## 2018-07-27 DIAGNOSIS — F3289 Other specified depressive episodes: Secondary | ICD-10-CM

## 2018-07-27 DIAGNOSIS — F411 Generalized anxiety disorder: Secondary | ICD-10-CM

## 2018-07-27 DIAGNOSIS — M7062 Trochanteric bursitis, left hip: Secondary | ICD-10-CM

## 2018-07-27 DIAGNOSIS — Z79899 Other long term (current) drug therapy: Secondary | ICD-10-CM

## 2018-07-27 DIAGNOSIS — M5126 Other intervertebral disc displacement, lumbar region: Secondary | ICD-10-CM | POA: Insufficient documentation

## 2018-07-27 DIAGNOSIS — M542 Cervicalgia: Secondary | ICD-10-CM

## 2018-07-27 DIAGNOSIS — M62830 Muscle spasm of back: Secondary | ICD-10-CM

## 2018-07-27 MED ORDER — CITALOPRAM HYDROBROMIDE 10 MG PO TABS: 10.0000 mg | ORAL_TABLET | Freq: Every day | ORAL | 3 refills | Status: DC

## 2018-07-27 MED ORDER — ALPRAZOLAM 0.25 MG PO TABS: 0.2500 mg | ORAL_TABLET | Freq: Every day | ORAL | 2 refills | Status: DC

## 2018-07-27 MED ORDER — OXYCODONE-ACETAMINOPHEN 10-325 MG PO TABS: ORAL_TABLET | ORAL | 0 refills | Status: DC

## 2018-07-27 MED ORDER — TIZANIDINE HCL 4 MG PO TABS: ORAL_TABLET | ORAL | 3 refills | Status: DC

## 2018-07-27 MED ORDER — DICLOFENAC SODIUM 75 MG PO TBEC: 75.0000 mg | DELAYED_RELEASE_TABLET | Freq: Two times a day (BID) | ORAL | 3 refills | Status: DC

## 2018-07-27 MED ORDER — GABAPENTIN 300 MG PO CAPS: ORAL_CAPSULE | ORAL | 3 refills | Status: DC

## 2018-07-27 NOTE — Progress Notes (Signed)
Subjective:    Patient ID: [name], female    DOB: [birth year], 48 y.o.   MRN: [id number]  HPI: [name] is a 48 y.o. female her appointment was changed, due to national recommendations of social distancing due to [place] 19, an audio/video telehealth visit is felt to be most appropriate for this patient at this time.  See Chart message from today for the patient's consent to telehealth from [place].     She states her pain is located in her neck, lower back, left hip and left knee. She rates her pain 8. Her current exercise regime is walking.  She is also prescribed Alprazolam. .We have discussed the black box warning of using opioids and benzodiazepines. I highlighted the dangers of using these drugs together and discussed the adverse events including respiratory suppression, overdose, cognitive impairment and importance of compliance with current regimen. We will continue to monitor and adjust as indicated.   Geryl Rankins CMA asked the Health and History Questions. This provider and Mancel Parsons verified we were  speaking with the correct person using two identifiers.  Pain Inventory Average Pain 9 Pain Right Now 8 My pain is constant, sharp, burning, dull, stabbing and aching  In the last 24 hours, has pain interfered with the following? General activity 9 Relation with others 9 Enjoyment of life 9 What TIME of day is your pain at its worst? varies Sleep (in general) Poor  Pain is worse with: walking, bending, sitting, standing and some activites Pain improves with: heat/ice and medication Relief from Meds: 2  Mobility walk with assistance use a cane how many minutes can you walk? 2 ability to climb steps?  no do you drive?  no  Function not employed: date last employed .  Neuro/Psych bowel control problems numbness trouble walking spasms  Prior Studies Any changes since last visit?  no  Physicians involved in  your care Any changes since last visit?  no   Family History  Problem Relation Age of Onset  . Hypertension Maternal Grandmother   . Anesthesia problems Neg Hx    Social History   Socioeconomic History  . Marital status: Divorced    Spouse name: Not on file  . Number of children: Not on file  . Years of education: Not on file  . Highest education level: Not on file  Occupational History  . Not on file  Social Needs  . Financial resource strain: Not on file  . Food insecurity:    Worry: Not on file    Inability: Not on file  . Transportation needs:    Medical: Not on file    Non-medical: Not on file  Tobacco Use  . Smoking status: Never Smoker  . Smokeless tobacco: Never Used  Substance and Sexual Activity  . Alcohol use: No  . Drug use: No  . Sexual activity: Not Currently    Birth control/protection: None    Comment: IUD removed 2 wks ago.  Lifestyle  . Physical activity:    Days per week: Not on file    Minutes per session: Not on file  . Stress: Not on file  Relationships  . Social connections:    Talks on phone: Not on file    Gets together: Not on file    Attends religious service: Not on file    Active member of club or organization: Not on file    Attends meetings of clubs or organizations: Not  on file    Relationship status: Not on file  Other Topics Concern  . Not on file  Social History Narrative  . Not on file   Past Surgical History:  Procedure Laterality Date  . ABDOMINAL HYSTERECTOMY  06/29/11   Robotic Assisted Hysterectomy  . CERVICAL SPINE SURGERY  [place]  . LUMBAR LAMINECTOMY  08/31/2011   Procedure: MICRODISCECTOMY LUMBAR LAMINECTOMY;  Surgeon: Tobi Bastos, MD;  [facility];  Service: Orthopedics;  Laterality: N/A;  lumbar five sacral one central microdisectomy  . MYOMECTOMY  13  . NECK SURGERY    . POSTERIOR CERVICAL FUSION/FORAMINOTOMY N/A 02/02/2013   Procedure: POSTERIOR C5-6 SPINAL FUSION;  Surgeon:  Melina Schools, MD;  Location: [place];  Service: Orthopedics;  Laterality: N/A;  . POSTERIOR FUSION CERVICAL SPINE  02/02/2013   C 5 C6     [name]  . WISDOM TOOTH EXTRACTION     Past Medical History:  Diagnosis Date  . Anemia    hx  . Arthritis   . Complication of anesthesia    slow to wake up,   very difficult to get iv had picc line last time  . Difficult intravenous access    hands are usually best for blood draws and IV starts  . DVT (deep venous thrombosis) ([place])    ? vs lupus  . Fibroids   . Headache(784.0)   . Neck pain   . Scoliosis    Ht 5\' 5"  (1.651 m)   Wt 250 lb (113.4 kg)   LMP 04/01/2011   BMI 41.60 kg/m   Opioid Risk Score:   Fall Risk Score:  `1  Depression screen PHQ 2/9  Depression screen [facility] 2/9 06/29/2018 05/03/2018 03/07/2018 01/03/2018 09/07/2017 06/07/2017 05/10/2017  Decreased Interest 0 0 0 0 1 1 1   Down, Depressed, Hopeless 0 0 0 0 1 1 1   PHQ - 2 Score 0 0 0 0 2 2 2   Altered sleeping - - - - - - -  Tired, decreased energy - - - - - - -  Change in appetite - - - - - - -  Feeling bad or failure about yourself  - - - - - - -  Trouble concentrating - - - - - - -  Moving slowly or fidgety/restless - - - - - - -  Suicidal thoughts - - - - - - -  PHQ-9 Score - - - - - - -  Difficult doing work/chores - - - - - - -    Review of Systems  Constitutional: Negative.   HENT: Negative.   Eyes: Negative.   Respiratory: Negative.   Cardiovascular: Negative.   Gastrointestinal: Positive for constipation.  Endocrine: Negative.   Genitourinary: Negative.   Musculoskeletal: Positive for arthralgias, back pain, gait problem, neck pain and neck stiffness.       Spasms  Skin: Negative.   Allergic/Immunologic: Negative.   Neurological: Positive for numbness.  Hematological: Negative.   Psychiatric/Behavioral: Negative.   All other systems reviewed and are negative.      Objective:   Physical Exam Vitals signs and nursing note reviewed.   Musculoskeletal:     Comments: No Physical Exam Perform: Virtual Visit  Neurological:     Mental Status: She is oriented to person, place, and time.           Assessment & Plan:  1. 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 Status post  decompressive laminectomy:Chronic Low Back Pain without Sciatica:  Continue to MonitorRefilled:Oxycodone 10mg /325 mg one tablet five times a day as needed#135.07/27/2018 We will continue the opioid monitoring program,this consists of regular clinic visits, examinations, urine drug screen,pill counts as well as use of [place] Controlled Substance Reporting System. 2. Insomnia/ Anxiety: Continuecurrent medication regimen withXanax and Tizanidine. 07/27/2018 3. Lumbar Radiculopathy/Neuropathic pain: Continuecurrent medication regimen withGabapentin.07/27/2018. 4. Muscle Spasms. Continuecurrent medication regimen with: Tizanidine.07/27/2018 5. Neurogenic bowel and bladder.Continue to monitor.07/27/2018 6. Constipation/Opioid Therapy: Continuecurrent medication regimen with Linzess. 07/27/2018 7. Depression: Continuecurrent medication regimen withCelexa.07/27/2018 8.LeftGreater Trochanteric Bursitis: Continue Heat/Ice Therapy. 07/29/2018 9. Chronic Pain Syndrome: Continuecurrent medication regimen withVoltaren.07/27/2018 10. Chronic Right Knee Pain:Continue current medication regimen.07/27/2018 11. Cervicalgia/ Cervical Radiculitis:Continue current medication regimenand HEP as tolerated.. Continue to Monitor.07/27/2018  F/U in 1 month   [employer] Location of patient: In her Home Location of provider: Office Established patient Time spent on call: 15 Minutes

## 2018-08-24 ENCOUNTER — Other Ambulatory Visit: Payer: Self-pay

## 2018-08-24 ENCOUNTER — Encounter
Payer: No Typology Code available for payment source | Attending: Physical Medicine and Rehabilitation | Admitting: Registered Nurse

## 2018-08-24 VITALS — BP 146/92 | HR 85 | Temp 98.7°F | Ht 65.0 in | Wt 253.0 lb

## 2018-08-24 DIAGNOSIS — G834 Cauda equina syndrome: Secondary | ICD-10-CM

## 2018-08-24 DIAGNOSIS — M7061 Trochanteric bursitis, right hip: Secondary | ICD-10-CM

## 2018-08-24 DIAGNOSIS — M5126 Other intervertebral disc displacement, lumbar region: Secondary | ICD-10-CM | POA: Insufficient documentation

## 2018-08-24 DIAGNOSIS — M545 Low back pain, unspecified: Secondary | ICD-10-CM

## 2018-08-24 DIAGNOSIS — G894 Chronic pain syndrome: Secondary | ICD-10-CM

## 2018-08-24 DIAGNOSIS — F411 Generalized anxiety disorder: Secondary | ICD-10-CM

## 2018-08-24 DIAGNOSIS — M961 Postlaminectomy syndrome, not elsewhere classified: Secondary | ICD-10-CM

## 2018-08-24 DIAGNOSIS — G8929 Other chronic pain: Secondary | ICD-10-CM

## 2018-08-24 DIAGNOSIS — M48061 Spinal stenosis, lumbar region without neurogenic claudication: Secondary | ICD-10-CM | POA: Insufficient documentation

## 2018-08-24 DIAGNOSIS — F3289 Other specified depressive episodes: Secondary | ICD-10-CM

## 2018-08-24 DIAGNOSIS — Z5181 Encounter for therapeutic drug level monitoring: Secondary | ICD-10-CM

## 2018-08-24 DIAGNOSIS — M62830 Muscle spasm of back: Secondary | ICD-10-CM

## 2018-08-24 DIAGNOSIS — Z79899 Other long term (current) drug therapy: Secondary | ICD-10-CM

## 2018-08-24 MED ORDER — OXYCODONE-ACETAMINOPHEN 10-325 MG PO TABS: ORAL_TABLET | ORAL | 0 refills | Status: DC

## 2018-08-24 NOTE — Progress Notes (Signed)
Subjective:    Patient ID: [name], female    DOB: October 16, 1970, 48 y.o.   MRN: [id number]  HPI: [name] is a 48 y.o. female who returns for follow up appointment for chronic pain and medication refill. She states her pain is located in her lower back and right knee. She rates her pain 9. Her  current exercise regime is walking.   [name] Morphine equivalent is 75.00 MME. She is also prescribed Alprazolam. We have discussed the black box warning of using opioids and benzodiazepines. I highlighted the dangers of using these drugs together and discussed the adverse events including respiratory suppression, overdose, cognitive impairment and importance of compliance with current regimen. We will continue to monitor and adjust as indicated.    Last Oral Swab was Performed on 06/01/2018, it was consistent.   Pain Inventory Average Pain 9 Pain Right Now 9 My pain is sharp, burning, dull, stabbing, tingling and aching  In the last 24 hours, has pain interfered with the following? General activity 9 Relation with others 9 Enjoyment of life 9 What TIME of day is your pain at its worst? all Sleep (in general) Poor  Pain is worse with: walking, bending, sitting and standing Pain improves with: rest, heat/ice and medication Relief from Meds: 3  Mobility use a cane ability to climb steps?  no do you drive?  no  Function not employed: date last employed na I need assistance with the following:  meal prep, household duties and shopping  Neuro/Psych numbness spasms  Prior Studies Any changes since last visit?  no  Physicians involved in your care Any changes since last visit?  no   Family History  Problem Relation Age of Onset  . Hypertension Maternal Grandmother   . Anesthesia problems Neg Hx    Social History   Socioeconomic History  . Marital status: Divorced    Spouse name: Not on file  . Number of children: Not on file  . Years of education: Not on  file  . Highest education level: Not on file  Occupational History  . Not on file  Social Needs  . Financial resource strain: Not on file  . Food insecurity:    Worry: Not on file    Inability: Not on file  . Transportation needs:    Medical: Not on file    Non-medical: Not on file  Tobacco Use  . Smoking status: Never Smoker  . Smokeless tobacco: Never Used  Substance and Sexual Activity  . Alcohol use: No  . Drug use: No  . Sexual activity: Not Currently    Birth control/protection: None    Comment: IUD removed 2 wks ago.  Lifestyle  . Physical activity:    Days per week: Not on file    Minutes per session: Not on file  . Stress: Not on file  Relationships  . Social connections:    Talks on phone: Not on file    Gets together: Not on file    Attends religious service: Not on file    Active member of club or organization: Not on file    Attends meetings of clubs or organizations: Not on file    Relationship status: Not on file  Other Topics Concern  . Not on file  Social History Narrative  . Not on file   Past Surgical History:  Procedure Laterality Date  . ABDOMINAL HYSTERECTOMY  06/29/11   Robotic Assisted Hysterectomy  . CERVICAL SPINE SURGERY  [place]  . LUMBAR LAMINECTOMY  08/31/2011   Procedure: MICRODISCECTOMY LUMBAR LAMINECTOMY;  Surgeon: Brooke Bastos, MD;  Location: [facility];  Service: Orthopedics;  Laterality: N/A;  lumbar five sacral one central microdisectomy  . MYOMECTOMY  13  . NECK SURGERY    . POSTERIOR CERVICAL FUSION/FORAMINOTOMY N/A 02/02/2013   Procedure: POSTERIOR C5-6 SPINAL FUSION;  Surgeon: Brooke Schools, MD;  Location: [place];  Service: Orthopedics;  Laterality: N/A;  . POSTERIOR FUSION CERVICAL SPINE  02/02/2013   C 5 C6     Dr [name]  . WISDOM TOOTH EXTRACTION     Past Medical History:  Diagnosis Date  . Anemia    hx  . Arthritis   . Complication of anesthesia    slow to wake up,   very difficult to get iv had  picc line last time  . Difficult intravenous access    hands are usually best for blood draws and IV starts  . DVT (deep venous thrombosis) ([place])    ? vs lupus  . Fibroids   . Headache(784.0)   . Neck pain   . Scoliosis    BP (!) 146/92   Pulse 85   Temp 98.7 F (37.1 C)   Ht 5\' 5"  (1.651 m)   Wt 253 lb (114.8 kg)   LMP 04/01/2011   SpO2 99%   BMI 42.10 kg/m   Opioid Risk Score:   Fall Risk Score:  `1  Depression screen PHQ 2/9  Depression screen [facility] 2/9 08/24/2018 06/29/2018 05/03/2018 03/07/2018 01/03/2018 09/07/2017 06/07/2017  Decreased Interest 0 0 0 0 0 1 1  Down, Depressed, Hopeless 0 0 0 0 0 1 1  PHQ - 2 Score 0 0 0 0 0 2 2  Altered sleeping - - - - - - -  Tired, decreased energy - - - - - - -  Change in appetite - - - - - - -  Feeling bad or failure about yourself  - - - - - - -  Trouble concentrating - - - - - - -  Moving slowly or fidgety/restless - - - - - - -  Suicidal thoughts - - - - - - -  PHQ-9 Score - - - - - - -  Difficult doing work/chores - - - - - - -    Review of Systems  Constitutional: Positive for diaphoresis and unexpected weight change.  Eyes: Negative.   Respiratory: Positive for shortness of breath.   Cardiovascular: Negative.   Gastrointestinal: Positive for constipation and nausea.  Endocrine: Negative.   Genitourinary: Negative.   Musculoskeletal: Negative.   Skin: Negative.   Allergic/Immunologic: Negative.   Neurological: Negative.   Hematological: Negative.   Psychiatric/Behavioral: Negative.   All other systems reviewed and are negative.      Objective:   Physical Exam Constitutional:      Appearance: Normal appearance.  Neck:     Musculoskeletal: Normal range of motion and neck supple.  Cardiovascular:     Rate and Rhythm: Normal rate and regular rhythm.     Pulses: Normal pulses.     Heart sounds: Normal heart sounds.  Pulmonary:     Effort: Pulmonary effort is normal.     Breath sounds: Normal breath sounds.   Musculoskeletal:     Comments: Normal Muscle Bulk and Muscle Testing Reveals:  Upper Extremities: Full ROM and Muscle Strength 5/5  Lumbar Hypersensitivity Lower Extremities: Full ROM and Muscle Strength  5/5 Arises from Table Slowly using cane for support Narrow Based Gait  Skin:    General: Skin is warm and dry.  Neurological:     Mental Status: She is alert and oriented to person, place, and time.  Psychiatric:        Mood and Affect: Mood normal.        Behavior: Behavior normal.           Assessment & Plan:  1. 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 Status post decompressive laminectomy:Chronic Low Back Pain without Sciatica:Continue to MonitorRefilled:Oxycodone 10mg /325 mg one tablet five times a day as needed#135.08/24/2018 We will continue the opioid monitoring program,this consists of regular clinic visits, examinations, urine drug screen,pill counts as well as use of [place] Controlled Substance Reporting System. 2. Insomnia/ Anxiety: Continuecurrent medication regimen withXanax and Tizanidine.08/24/2018 3. Lumbar Radiculopathy/Neuropathic pain: Continuecurrent medication regimen withGabapentin.08/24/2018. 4. Muscle Spasms. Continuecurrent medication regimen with: Tizanidine.08/24/2018 5. Neurogenic bowel and bladder.Continue to monitor.08/24/2018 6. Constipation/Opioid Therapy: Continuecurrent medication regimen with Linzess. 08/24/2018 7. Depression: Continuecurrent medication regimen withCelexa.08/24/2018 8.LeftGreater Trochanteric Bursitis: No complaints today. Continue Heat/Ice Therapy. 08/24/2018 9. Chronic Pain Syndrome: Continuecurrent medication regimen withVoltaren.08/24/2018 10. Chronic Right Knee Pain:Continue current medication regimen.08/24/2018 11. Cervicalgia/ Cervical Radiculitis:Continue current medication regimenand HEP as tolerated.. Continue to Monitor.08/24/2018  15 minutes  of face to face patient care time was spent during this visit. All questions were encouraged and answered.  F/U in 1 month

## 2018-08-25 ENCOUNTER — Encounter: Payer: Self-pay | Admitting: Registered Nurse

## 2018-09-09 ENCOUNTER — Telehealth: Payer: Self-pay | Admitting: Internal Medicine

## 2018-09-09 NOTE — Telephone Encounter (Signed)
Unable to contact Pt, no answer, to informed her that she need to be an acting establish Pt with her PCP before she can applied for CAFA and OC

## 2018-09-26 ENCOUNTER — Encounter
Payer: No Typology Code available for payment source | Attending: Physical Medicine and Rehabilitation | Admitting: Registered Nurse

## 2018-09-26 DIAGNOSIS — M5126 Other intervertebral disc displacement, lumbar region: Secondary | ICD-10-CM | POA: Insufficient documentation

## 2018-09-26 DIAGNOSIS — M48061 Spinal stenosis, lumbar region without neurogenic claudication: Secondary | ICD-10-CM | POA: Insufficient documentation

## 2018-09-26 DIAGNOSIS — G834 Cauda equina syndrome: Secondary | ICD-10-CM | POA: Insufficient documentation

## 2018-09-27 ENCOUNTER — Other Ambulatory Visit: Payer: Self-pay

## 2018-09-27 ENCOUNTER — Encounter: Payer: Self-pay | Admitting: Registered Nurse

## 2018-09-27 ENCOUNTER — Encounter (HOSPITAL_BASED_OUTPATIENT_CLINIC_OR_DEPARTMENT_OTHER): Payer: Self-pay | Admitting: Registered Nurse

## 2018-09-27 VITALS — Ht 65.0 in | Wt 248.0 lb

## 2018-09-27 DIAGNOSIS — F5101 Primary insomnia: Secondary | ICD-10-CM

## 2018-09-27 DIAGNOSIS — Z5181 Encounter for therapeutic drug level monitoring: Secondary | ICD-10-CM

## 2018-09-27 DIAGNOSIS — Z79899 Other long term (current) drug therapy: Secondary | ICD-10-CM

## 2018-09-27 DIAGNOSIS — G894 Chronic pain syndrome: Secondary | ICD-10-CM

## 2018-09-27 DIAGNOSIS — M961 Postlaminectomy syndrome, not elsewhere classified: Secondary | ICD-10-CM

## 2018-09-27 DIAGNOSIS — M62830 Muscle spasm of back: Secondary | ICD-10-CM

## 2018-09-27 DIAGNOSIS — F411 Generalized anxiety disorder: Secondary | ICD-10-CM

## 2018-09-27 DIAGNOSIS — F3289 Other specified depressive episodes: Secondary | ICD-10-CM

## 2018-09-27 DIAGNOSIS — M545 Low back pain, unspecified: Secondary | ICD-10-CM

## 2018-09-27 DIAGNOSIS — G834 Cauda equina syndrome: Secondary | ICD-10-CM

## 2018-09-27 DIAGNOSIS — M7061 Trochanteric bursitis, right hip: Secondary | ICD-10-CM

## 2018-09-27 DIAGNOSIS — G8929 Other chronic pain: Secondary | ICD-10-CM

## 2018-09-27 DIAGNOSIS — M542 Cervicalgia: Secondary | ICD-10-CM

## 2018-09-27 MED ORDER — ALPRAZOLAM 0.25 MG PO TABS: 0.2500 mg | ORAL_TABLET | Freq: Every day | ORAL | 2 refills | Status: DC

## 2018-09-27 MED ORDER — OXYCODONE-ACETAMINOPHEN 10-325 MG PO TABS: ORAL_TABLET | ORAL | 0 refills | Status: DC

## 2018-09-27 NOTE — Progress Notes (Signed)
Subjective:    Patient ID: [name], female    DOB: 20-Oct-1970, 48 y.o.   MRN: [id number]  HPI: [name] is a 48 y.o. female whose appointment was changed, due to national recommendations of social distancing due to [place] 19, an audio/video telehealth visit is felt to be most appropriate for this patient at this time.  See Chart message from today for the patient's consent to telehealth from [place].   She states her pain is located in her neck,lower back and right hip pain. Also reports generalized joint pain all over. She rates her pain 9. Her current exercise regime is walking.   Ms. Kentner Morphine equivalent is 75.00  MME. She  is also prescribed Alprazolam. We have discussed the black box warning of using opioids and benzodiazepines. I highlighted the dangers of using these drugs together and discussed the adverse events including respiratory suppression, overdose, cognitive impairment and importance of compliance with current regimen. We will continue to monitor and adjust as indicated.   Last Oral Swab was Performed on 06/01/2018, it was consistent.    Retia Passe CMA asked the Health and History Questions. This provider and Retia Passe verified we were  speaking with the correct person using two identifiers.  Pain Inventory Average Pain 8 Pain Right Now 9 My pain is constant, sharp, burning, dull, stabbing, tingling and aching  In the last 24 hours, has pain interfered with the following? General activity 6 Relation with others 6 Enjoyment of life 6 What TIME of day is your pain at its worst? all the time Sleep (in general) Poor  Pain is worse with: walking Pain improves with: heat/ice and medication Relief from Meds: 8  Mobility use a cane ability to climb steps?  yes do you drive?  no  Function disabled: date disabled n/a  Neuro/Psych trouble walking depression anxiety  Prior Studies no  Physicians involved in  your care no   Family History  Problem Relation Age of Onset  . Hypertension Maternal Grandmother   . Anesthesia problems Neg Hx    Social History   Socioeconomic History  . Marital status: Divorced    Spouse name: Not on file  . Number of children: Not on file  . Years of education: Not on file  . Highest education level: Not on file  Occupational History  . Not on file  Social Needs  . Financial resource strain: Not on file  . Food insecurity    Worry: Not on file    Inability: Not on file  . Transportation needs    Medical: Not on file    Non-medical: Not on file  Tobacco Use  . Smoking status: Never Smoker  . Smokeless tobacco: Never Used  Substance and Sexual Activity  . Alcohol use: No  . Drug use: No  . Sexual activity: Not Currently    Birth control/protection: None    Comment: IUD removed 2 wks ago.  Lifestyle  . Physical activity    Days per week: Not on file    Minutes per session: Not on file  . Stress: Not on file  Relationships  . Social Herbalist on phone: Not on file    Gets together: Not on file    Attends religious service: Not on file    Active member of club or organization: Not on file    Attends meetings of clubs or organizations: Not on file  Relationship status: Not on file  Other Topics Concern  . Not on file  Social History Narrative  . Not on file   Past Surgical History:  Procedure Laterality Date  . ABDOMINAL HYSTERECTOMY  06/29/11   Robotic Assisted Hysterectomy  . CERVICAL SPINE SURGERY  [place]  . LUMBAR LAMINECTOMY  08/31/2011   Procedure: MICRODISCECTOMY LUMBAR LAMINECTOMY;  Surgeon: Tobi Bastos, MD;  [facility];  Service: Orthopedics;  Laterality: N/A;  lumbar five sacral one central microdisectomy  . MYOMECTOMY  13  . NECK SURGERY    . POSTERIOR CERVICAL FUSION/FORAMINOTOMY N/A 02/02/2013   Procedure: POSTERIOR C5-6 SPINAL FUSION;  Surgeon: Melina Schools, MD;  Location: [place];  Service: Orthopedics;  Laterality: N/A;  . POSTERIOR FUSION CERVICAL SPINE  02/02/2013   C 5 C6     [name]  . WISDOM TOOTH EXTRACTION     Past Medical History:  Diagnosis Date  . Anemia    hx  . Arthritis   . Complication of anesthesia    slow to wake up,   very difficult to get iv had picc line last time  . Difficult intravenous access    hands are usually best for blood draws and IV starts  . DVT (deep venous thrombosis) ([place])    ? vs lupus  . Fibroids   . Headache(784.0)   . Neck pain   . Scoliosis    Ht 5\' 5"  (1.651 m)   Wt 248 lb (112.5 kg)   LMP 04/01/2011   BMI 41.27 kg/m   Opioid Risk Score:   Fall Risk Score:  `1  Depression screen PHQ 2/9  Depression screen [facility] 2/9 08/24/2018 06/29/2018 05/03/2018 03/07/2018 01/03/2018 09/07/2017 06/07/2017  Decreased Interest 0 0 0 0 0 1 1  Down, Depressed, Hopeless 0 0 0 0 0 1 1  PHQ - 2 Score 0 0 0 0 0 2 2  Altered sleeping - - - - - - -  Tired, decreased energy - - - - - - -  Change in appetite - - - - - - -  Feeling bad or failure about yourself  - - - - - - -  Trouble concentrating - - - - - - -  Moving slowly or fidgety/restless - - - - - - -  Suicidal thoughts - - - - - - -  PHQ-9 Score - - - - - - -  Difficult doing work/chores - - - - - - -       Review of Systems  Musculoskeletal: Positive for gait problem.  All other systems reviewed and are negative.      Objective:   Physical Exam        Assessment & Plan:  1. 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 Status post decompressive laminectomy:Chronic Low Back Pain without Sciatica:Continue to MonitorRefilled:Oxycodone 10mg /325 mg one tablet five times a day as needed#135.09/27/2018 We will continue the opioid monitoring program,this consists of regular clinic visits, examinations, urine drug screen,pill counts as well as use of [place] Controlled Substance Reporting System. 2. Insomnia/ Anxiety:  Continuecurrent medication regimen withXanax 09/27/2018 3. Lumbar Radiculopathy/Neuropathic pain: Continuecurrent medication regimen withGabapentin.09/27/2018. 4. Muscle Spasms. Continuecurrent medication regimen with: Tizanidine.09/27/2018 5. Neurogenic bowel and bladder.Continue to monitor.09/27/2018 6. Constipation/Opioid Therapy: Continuecurrent medication regimen with Linzess. 09/27/2018 7. Depression: Continuecurrent medication regimen withCelexa.09/27/2018 8.RightGreater Trochanteric Bursitis: Continue Heat/Ice Therapy. 09/27/2018 9. Chronic Pain Syndrome: Continuecurrent medication regimen withVoltaren.09/27/2018 10.  Chronic Right Knee Pain:No complaints today.Continue current medication regimen.09/27/2018 11. Cervicalgia/ Cervical Radiculitis:Continue current medication regimenand HEP as tolerated.. Continue to Monitor.09/27/2018  F/U in 1 month  [place] Location of patient: In her Home Location of provider: Office Established patient Time spent on call:15 monutes

## 2018-10-25 ENCOUNTER — Encounter: Payer: Self-pay | Admitting: Registered Nurse

## 2018-10-25 ENCOUNTER — Other Ambulatory Visit: Payer: Self-pay

## 2018-10-25 ENCOUNTER — Encounter: Payer: Self-pay | Attending: Physical Medicine and Rehabilitation | Admitting: Registered Nurse

## 2018-10-25 VITALS — BP 135/83 | HR 98 | Temp 97.5°F | Resp 16 | Ht 65.0 in | Wt 255.2 lb

## 2018-10-25 DIAGNOSIS — M5126 Other intervertebral disc displacement, lumbar region: Secondary | ICD-10-CM | POA: Insufficient documentation

## 2018-10-25 DIAGNOSIS — M545 Low back pain: Secondary | ICD-10-CM

## 2018-10-25 DIAGNOSIS — M961 Postlaminectomy syndrome, not elsewhere classified: Secondary | ICD-10-CM

## 2018-10-25 DIAGNOSIS — G834 Cauda equina syndrome: Secondary | ICD-10-CM | POA: Insufficient documentation

## 2018-10-25 DIAGNOSIS — Z5181 Encounter for therapeutic drug level monitoring: Secondary | ICD-10-CM

## 2018-10-25 DIAGNOSIS — Z79899 Other long term (current) drug therapy: Secondary | ICD-10-CM

## 2018-10-25 DIAGNOSIS — M48061 Spinal stenosis, lumbar region without neurogenic claudication: Secondary | ICD-10-CM | POA: Insufficient documentation

## 2018-10-25 DIAGNOSIS — F411 Generalized anxiety disorder: Secondary | ICD-10-CM

## 2018-10-25 DIAGNOSIS — G894 Chronic pain syndrome: Secondary | ICD-10-CM

## 2018-10-25 DIAGNOSIS — F5101 Primary insomnia: Secondary | ICD-10-CM

## 2018-10-25 DIAGNOSIS — M542 Cervicalgia: Secondary | ICD-10-CM

## 2018-10-25 DIAGNOSIS — M62830 Muscle spasm of back: Secondary | ICD-10-CM

## 2018-10-25 DIAGNOSIS — F3289 Other specified depressive episodes: Secondary | ICD-10-CM

## 2018-10-25 DIAGNOSIS — G8929 Other chronic pain: Secondary | ICD-10-CM

## 2018-10-25 DIAGNOSIS — M7061 Trochanteric bursitis, right hip: Secondary | ICD-10-CM

## 2018-10-25 MED ORDER — OXYCODONE-ACETAMINOPHEN 10-325 MG PO TABS: ORAL_TABLET | ORAL | 0 refills | Status: DC

## 2018-10-25 NOTE — Progress Notes (Signed)
Subjective:    Patient ID: [name], female    DOB: [birth year], 48 y.o.   MRN: [id number]  HPI: [name] is a 48 y.o. female who returns for follow up appointment for chronic pain and medication refill. She states her pain is located in her neck, lower back and right knee. She rates her  Pain 6. Her current exercise regime is walking.  [name] Morphine equivalent is 75.00  MME. Last Oral Swab was Performed on 06/01/2018, it was consistent  [name] spoke with Godfrey Pick regarding personal issue today.   Pain Inventory Average Pain 8 Pain Right Now 6 My pain is sharp, dull, tingling and aching  In the last 24 hours, has pain interfered with the following? General activity 8 Relation with others 9 Enjoyment of life 9 What TIME of day is your pain at its worst? all Sleep (in general) Poor  Pain is worse with: walking, bending, sitting and standing Pain improves with: rest, heat/ice and medication Relief from Meds: 2  Mobility walk with assistance use a cane ability to climb steps?  no do you drive?  no  Function not employed: date last employed .  Neuro/Psych No problems in this area  Prior Studies Any changes since last visit?  no  Physicians involved in your care Any changes since last visit?  no   Family History  Problem Relation Age of Onset  . Hypertension Maternal Grandmother   . Anesthesia problems Neg Hx    Social History   Socioeconomic History  . Marital status: Divorced    Spouse name: Not on file  . Number of children: Not on file  . Years of education: Not on file  . Highest education level: Not on file  Occupational History  . Not on file  Social Needs  . Financial resource strain: Not on file  . Food insecurity    Worry: Not on file    Inability: Not on file  . Transportation needs    Medical: Not on file    Non-medical: Not on file  Tobacco Use  . Smoking status: Never Smoker  . Smokeless tobacco:  Never Used  Substance and Sexual Activity  . Alcohol use: No  . Drug use: No  . Sexual activity: Not Currently    Birth control/protection: None    Comment: IUD removed 2 wks ago.  Lifestyle  . Physical activity    Days per week: Not on file    Minutes per session: Not on file  . Stress: Not on file  Relationships  . Social Herbalist on phone: Not on file    Gets together: Not on file    Attends religious service: Not on file    Active member of club or organization: Not on file    Attends meetings of clubs or organizations: Not on file    Relationship status: Not on file  Other Topics Concern  . Not on file  Social History Narrative  . Not on file   Past Surgical History:  Procedure Laterality Date  . ABDOMINAL HYSTERECTOMY  06/29/11   Robotic Assisted Hysterectomy  . CERVICAL SPINE SURGERY  [place]  . LUMBAR LAMINECTOMY  08/31/2011   Procedure: MICRODISCECTOMY LUMBAR LAMINECTOMY;  Surgeon: Tobi Bastos, MD;  [facility];  Service: Orthopedics;  Laterality: N/A;  lumbar five sacral one central microdisectomy  . MYOMECTOMY  13  . NECK SURGERY    .  POSTERIOR CERVICAL FUSION/FORAMINOTOMY N/A 02/02/2013   Procedure: POSTERIOR C5-6 SPINAL FUSION;  Surgeon: Melina Schools, MD;  Location: [place];  Service: Orthopedics;  Laterality: N/A;  . POSTERIOR FUSION CERVICAL SPINE  02/02/2013   C 5 C6     [name]  . WISDOM TOOTH EXTRACTION     Past Medical History:  Diagnosis Date  . Anemia    hx  . Arthritis   . Complication of anesthesia    slow to wake up,   very difficult to get iv had picc line last time  . Difficult intravenous access    hands are usually best for blood draws and IV starts  . DVT (deep venous thrombosis) ([place])    ? vs lupus  . Fibroids   . Headache(784.0)   . Neck pain   . Scoliosis    LMP 04/01/2011   Opioid Risk Score:   Fall Risk Score:  `1  Depression screen PHQ 2/9  Depression screen [facility] 2/9 08/24/2018  06/29/2018 05/03/2018 03/07/2018 01/03/2018 09/07/2017 06/07/2017  Decreased Interest 0 0 0 0 0 1 1  Down, Depressed, Hopeless 0 0 0 0 0 1 1  PHQ - 2 Score 0 0 0 0 0 2 2  Altered sleeping - - - - - - -  Tired, decreased energy - - - - - - -  Change in appetite - - - - - - -  Feeling bad or failure about yourself  - - - - - - -  Trouble concentrating - - - - - - -  Moving slowly or fidgety/restless - - - - - - -  Suicidal thoughts - - - - - - -  PHQ-9 Score - - - - - - -  Difficult doing work/chores - - - - - - -     Review of Systems  Constitutional: Negative.   HENT: Negative.   Eyes: Negative.   Respiratory: Negative.   Cardiovascular: Negative.   Gastrointestinal: Negative.   Endocrine: Negative.   Genitourinary: Negative.   Musculoskeletal: Positive for arthralgias, back pain, gait problem, myalgias, neck pain and neck stiffness.  Skin: Negative.   Allergic/Immunologic: Negative.   Hematological: Negative.   Psychiatric/Behavioral: Negative.        Objective:   Physical Exam Vitals signs and nursing note reviewed.  Constitutional:      Appearance: Normal appearance.  Neck:     Musculoskeletal: Normal range of motion and neck supple.  Cardiovascular:     Rate and Rhythm: Normal rate and regular rhythm.     Pulses: Normal pulses.     Heart sounds: Normal heart sounds.  Pulmonary:     Effort: Pulmonary effort is normal.     Breath sounds: Normal breath sounds.  Musculoskeletal:     Comments: Normal Muscle Bulk and Muscle Testing Reveals:  Upper Extremities: Full ROM and Muscle Strength 5/5  Lumbar Paraspinal Tenderness: L-3-L-5 Lower Extremities: Full ROM and Muscle Strength 5/5 Arises from Table with ease Narrow Baseed Gait   Skin:    General: Skin is warm and dry.  Neurological:     Mental Status: She is oriented to person, place, and time.  Psychiatric:        Mood and Affect: Mood normal.        Behavior: Behavior normal.           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 Status post decompressive laminectomy:Chronic Low Back Pain without  Sciatica:Continue to MonitorRefilled:Oxycodone 10mg /325 mg one tablet five times a day as needed#135.10/25/2018 We will continue the opioid monitoring program,this consists of regular clinic visits, examinations, urine drug screen,pill counts as well as use of [place] Controlled Substance Reporting System. 2. Insomnia/ Anxiety: Continuecurrent medication regimen withXanax 10/25/2018 3. Lumbar Radiculopathy/Neuropathic pain: Continuecurrent medication regimen withGabapentin.10/25/2018. 4. Muscle Spasms. Continuecurrent medication regimen with: Tizanidine.10/25/2018 5. Neurogenic bowel and bladder.Continue to monitor.10/25/2018 6. Constipation/Opioid Therapy: Continuecurrent medication regimen with Linzess. 10/25/2018 7. Depression: Continuecurrent medication regimen withCelexa.10/25/2018 8.RightGreater Trochanteric Bursitis:No complaints today. Continue Heat/Ice Therapy. 10/25/2018 9. Chronic Pain Syndrome: Continuecurrent medication regimen withVoltaren.10/25/2018 10. Chronic Right Knee Pain:Continue current medication regimen.10/25/2018 11. Cervicalgia/ Cervical Radiculitis:Continue current medication regimenand HEP as tolerated.. Continue to Monitor.10/25/2018  80minutes of face to face patient care time was spent during this visit. All questions were encouraged and answered.  F/U in 1 month

## 2018-11-29 ENCOUNTER — Other Ambulatory Visit: Payer: Self-pay

## 2018-11-29 ENCOUNTER — Encounter: Payer: Self-pay | Admitting: Registered Nurse

## 2018-11-29 ENCOUNTER — Encounter
Payer: No Typology Code available for payment source | Attending: Physical Medicine and Rehabilitation | Admitting: Registered Nurse

## 2018-11-29 VITALS — BP 142/76 | HR 106 | Temp 98.7°F | Ht 65.0 in | Wt 252.0 lb

## 2018-11-29 DIAGNOSIS — G834 Cauda equina syndrome: Secondary | ICD-10-CM | POA: Diagnosis not present

## 2018-11-29 DIAGNOSIS — M5126 Other intervertebral disc displacement, lumbar region: Secondary | ICD-10-CM | POA: Insufficient documentation

## 2018-11-29 DIAGNOSIS — F411 Generalized anxiety disorder: Secondary | ICD-10-CM

## 2018-11-29 DIAGNOSIS — M7061 Trochanteric bursitis, right hip: Secondary | ICD-10-CM

## 2018-11-29 DIAGNOSIS — M62830 Muscle spasm of back: Secondary | ICD-10-CM | POA: Diagnosis not present

## 2018-11-29 DIAGNOSIS — G894 Chronic pain syndrome: Secondary | ICD-10-CM

## 2018-11-29 DIAGNOSIS — F5101 Primary insomnia: Secondary | ICD-10-CM

## 2018-11-29 DIAGNOSIS — M961 Postlaminectomy syndrome, not elsewhere classified: Secondary | ICD-10-CM | POA: Diagnosis not present

## 2018-11-29 DIAGNOSIS — M48061 Spinal stenosis, lumbar region without neurogenic claudication: Secondary | ICD-10-CM | POA: Insufficient documentation

## 2018-11-29 DIAGNOSIS — M5416 Radiculopathy, lumbar region: Secondary | ICD-10-CM

## 2018-11-29 DIAGNOSIS — Z5181 Encounter for therapeutic drug level monitoring: Secondary | ICD-10-CM

## 2018-11-29 DIAGNOSIS — Z79899 Other long term (current) drug therapy: Secondary | ICD-10-CM

## 2018-11-29 DIAGNOSIS — M7062 Trochanteric bursitis, left hip: Secondary | ICD-10-CM

## 2018-11-29 MED ORDER — GABAPENTIN 300 MG PO CAPS: ORAL_CAPSULE | ORAL | 3 refills | Status: DC

## 2018-11-29 MED ORDER — OXYCODONE-ACETAMINOPHEN 10-325 MG PO TABS: ORAL_TABLET | ORAL | 0 refills | Status: DC

## 2018-11-29 MED ORDER — CITALOPRAM HYDROBROMIDE 10 MG PO TABS: 10.0000 mg | ORAL_TABLET | Freq: Every day | ORAL | 3 refills | Status: DC

## 2018-11-29 MED ORDER — DICLOFENAC SODIUM 75 MG PO TBEC: 75.0000 mg | DELAYED_RELEASE_TABLET | Freq: Two times a day (BID) | ORAL | 3 refills | Status: DC

## 2018-11-29 MED ORDER — ALPRAZOLAM 0.25 MG PO TABS: 0.2500 mg | ORAL_TABLET | Freq: Every day | ORAL | 2 refills | Status: DC

## 2018-11-29 MED ORDER — TIZANIDINE HCL 4 MG PO TABS: ORAL_TABLET | ORAL | 3 refills | Status: DC

## 2018-11-29 NOTE — Progress Notes (Signed)
Subjective:    Patient ID: [name], female    DOB: 20-Aug-1970, 48 y.o.   MRN: [id number]  HPI: [name] is a 48 y.o. female who returns for follow up appointment for chronic pain and medication refill. She states her pain is located in her lower back radiating into her left hip and left lower extremity. She rates her pain 9. Her current exercise regime is walking.  [name] Morphine equivalent is  75.00 MME. Last Oral Swab was Performed on 06/01/2018, it was consistent.   [name] purchased new insurance based on caseworker recommendation, [name] not sure if it will cover office visit. [name] instructed to call her insurance company, her next appointment will be scheduled in 2 months so she can address the above situation, she verbalizes understanding.   Pain Inventory Average Pain 9 Pain Right Now 9 My pain is sharp, dull, stabbing, tingling and aching  In the last 24 hours, has pain interfered with the following? General activity 9 Relation with others 9 Enjoyment of life 9 What TIME of day is your pain at its worst? all day Sleep (in general) Poor  Pain is worse with: walking, bending, sitting and standing Pain improves with: rest, heat/ice and medication Relief from Meds: 2  Mobility use a cane ability to climb steps?  no do you drive?  no  Function not employed: date last employed . I need assistance with the following:  meal prep, household duties and shopping  Neuro/Psych No problems in this area  Prior Studies Any changes since last visit?  no  Physicians involved in your care Any changes since last visit?  no   Family History  Problem Relation Age of Onset  . Hypertension Maternal Grandmother   . Anesthesia problems Neg Hx    Social History   Socioeconomic History  . Marital status: Divorced    Spouse name: Not on file  . Number of children: Not on file  . Years of education: Not on file  . Highest education  level: Not on file  Occupational History  . Not on file  Social Needs  . Financial resource strain: Not on file  . Food insecurity    Worry: Not on file    Inability: Not on file  . Transportation needs    Medical: Not on file    Non-medical: Not on file  Tobacco Use  . Smoking status: Never Smoker  . Smokeless tobacco: Never Used  Substance and Sexual Activity  . Alcohol use: No  . Drug use: No  . Sexual activity: Not Currently    Birth control/protection: None    Comment: IUD removed 2 wks ago.  Lifestyle  . Physical activity    Days per week: Not on file    Minutes per session: Not on file  . Stress: Not on file  Relationships  . Social Herbalist on phone: Not on file    Gets together: Not on file    Attends religious service: Not on file    Active member of club or organization: Not on file    Attends meetings of clubs or organizations: Not on file    Relationship status: Not on file  Other Topics Concern  . Not on file  Social History Narrative  . Not on file   Past Surgical History:  Procedure Laterality Date  . ABDOMINAL HYSTERECTOMY  06/29/11   Robotic Assisted Hysterectomy  . CERVICAL SPINE  SURGERY  [place]  . LUMBAR LAMINECTOMY  08/31/2011   Procedure: MICRODISCECTOMY LUMBAR LAMINECTOMY;  Surgeon: [name];  Location: [facility];  Service: Orthopedics;  Laterality: N/A;  lumbar five sacral one central microdisectomy  . MYOMECTOMY  13  . NECK SURGERY    . POSTERIOR CERVICAL FUSION/FORAMINOTOMY N/A 02/02/2013   Procedure: POSTERIOR C5-6 SPINAL FUSION;  Surgeon: [name];  Location: [place];  Service: Orthopedics;  Laterality: N/A;  . POSTERIOR FUSION CERVICAL SPINE  02/02/2013   C 5 C6     Dr [name]  . WISDOM TOOTH EXTRACTION     Past Medical History:  Diagnosis Date  . Anemia    hx  . Arthritis   . Complication of anesthesia    slow to wake up,   very difficult to get iv had picc line last time  .  Difficult intravenous access    hands are usually best for blood draws and IV starts  . DVT (deep venous thrombosis) ([name])    ? vs lupus  . Fibroids   . Headache(784.0)   . Neck pain   . Scoliosis    Temp 98.7 F (37.1 C)   Ht 5\' 5"  (1.651 m)   Wt 252 lb (114.3 kg)   LMP 04/01/2011   BMI 41.93 kg/m   Opioid Risk Score:   Fall Risk Score:  `1  Depression screen PHQ 2/9  Depression screen [facility] 2/9 08/24/2018 06/29/2018 05/03/2018 03/07/2018 01/03/2018 09/07/2017 06/07/2017  Decreased Interest 0 0 0 0 0 1 1  Down, Depressed, Hopeless 0 0 0 0 0 1 1  PHQ - 2 Score 0 0 0 0 0 2 2  Altered sleeping - - - - - - -  Tired, decreased energy - - - - - - -  Change in appetite - - - - - - -  Feeling bad or failure about yourself  - - - - - - -  Trouble concentrating - - - - - - -  Moving slowly or fidgety/restless - - - - - - -  Suicidal thoughts - - - - - - -  PHQ-9 Score - - - - - - -  Difficult doing work/chores - - - - - - -    Review of Systems  Constitutional: Negative.   HENT: Negative.   Eyes: Negative.   Respiratory: Negative.   Cardiovascular: Negative.   Gastrointestinal: Negative.   Endocrine: Negative.   Genitourinary: Negative.   Musculoskeletal: Negative.   Skin: Negative.   Allergic/Immunologic: Negative.   Neurological: Negative.   Hematological: Negative.   Psychiatric/Behavioral: Negative.   All other systems reviewed and are negative.      Objective:   Physical Exam Vitals signs and nursing note reviewed.  Constitutional:      Appearance: Normal appearance.  Neck:     Musculoskeletal: Normal range of motion and neck supple.  Cardiovascular:     Rate and Rhythm: Normal rate and regular rhythm.     Pulses: Normal pulses.     Heart sounds: Normal heart sounds.  Pulmonary:     Effort: Pulmonary effort is normal.     Breath sounds: Normal breath sounds.  Musculoskeletal:     Comments: Normal Muscle Bulk and Muscle Testing Reveals:  Upper Extremities:  Full ROM and Muscle Strength 5/5 , Lumbar Paraspinal Tenderness: L-3-L-5 Left Greater Trochanter Tenderness Lower Extremities: Right: Full ROM and Muscle Strength 5/5 Left: Decreased ROM and  Muscle Strength 5/5 Left Lower Extremity Flexion Produces Pain into Left Hip Arises from table slowly using cane for support Antalgic Gait   Skin:    General: Skin is warm and dry.  Neurological:     Mental Status: She is alert and oriented to person, place, and time.  Psychiatric:        Mood and Affect: Mood normal.        Behavior: Behavior normal.           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 Status post decompressive laminectomy:Chronic Low Back Pain without Sciatica:Continue to MonitorRefilled:Oxycodone 10mg /325 mg one tablet five times a day as needed#135.11/29/2018 We will continue the opioid monitoring program,this consists of regular clinic visits, examinations, urine drug screen,pill counts as well as use of [place] Controlled Substance Reporting System. 2. Insomnia/ Anxiety: Continuecurrent medication regimen withXanax 11/29/2018 3. Lumbar Radiculopathy/Neuropathic pain: Continuecurrent medication regimen withGabapentin.11/29/2018. 4. Muscle Spasms. Continuecurrent medication regimen with: Tizanidine.11/29/2018 5. Neurogenic bowel and bladder.Continue to monitor.11/29/2018 6. Constipation/Opioid Therapy: Continuecurrent medication regimen with Linzess. 11/29/2018 7. Depression: Continuecurrent medication regimen withCelexa.11/29/2018 8.Left Greater Trochanteric Bursitis:No complaints today. Continue Heat/Ice Therapy. 11/29/2018 9. Chronic Pain Syndrome: Continuecurrent medication regimen withVoltaren.11/29/2018 10. Chronic Right Knee Pain:No complaints today. Continue current medication regimen and continue to monitor. Marland Kitchen09/10/2018 11. Cervicalgia/ Cervical Radiculitis:No complaints  today. Continue current medication regimenand HEP as tolerated.. Continue to Monitor.11/29/2018  42minutes of face to face patient care time was spent during this visit. All questions were encouraged and answered.  F/U in 1 month

## 2019-01-24 ENCOUNTER — Encounter: Payer: Self-pay | Admitting: Registered Nurse

## 2019-01-24 ENCOUNTER — Encounter: Payer: PRIVATE HEALTH INSURANCE | Attending: Physical Medicine and Rehabilitation | Admitting: Registered Nurse

## 2019-01-24 ENCOUNTER — Other Ambulatory Visit: Payer: Self-pay

## 2019-01-24 DIAGNOSIS — G894 Chronic pain syndrome: Secondary | ICD-10-CM

## 2019-01-24 DIAGNOSIS — M62830 Muscle spasm of back: Secondary | ICD-10-CM

## 2019-01-24 DIAGNOSIS — F5101 Primary insomnia: Secondary | ICD-10-CM

## 2019-01-24 DIAGNOSIS — Z79891 Long term (current) use of opiate analgesic: Secondary | ICD-10-CM | POA: Diagnosis present

## 2019-01-24 DIAGNOSIS — M7062 Trochanteric bursitis, left hip: Secondary | ICD-10-CM

## 2019-01-24 DIAGNOSIS — M48061 Spinal stenosis, lumbar region without neurogenic claudication: Secondary | ICD-10-CM | POA: Diagnosis present

## 2019-01-24 DIAGNOSIS — M7061 Trochanteric bursitis, right hip: Secondary | ICD-10-CM

## 2019-01-24 DIAGNOSIS — Z79899 Other long term (current) drug therapy: Secondary | ICD-10-CM

## 2019-01-24 DIAGNOSIS — M5416 Radiculopathy, lumbar region: Secondary | ICD-10-CM | POA: Diagnosis not present

## 2019-01-24 DIAGNOSIS — G834 Cauda equina syndrome: Secondary | ICD-10-CM

## 2019-01-24 DIAGNOSIS — M5126 Other intervertebral disc displacement, lumbar region: Secondary | ICD-10-CM | POA: Diagnosis present

## 2019-01-24 DIAGNOSIS — F411 Generalized anxiety disorder: Secondary | ICD-10-CM

## 2019-01-24 DIAGNOSIS — Z5181 Encounter for therapeutic drug level monitoring: Secondary | ICD-10-CM | POA: Diagnosis present

## 2019-01-24 MED ORDER — OXYCODONE-ACETAMINOPHEN 10-325 MG PO TABS: ORAL_TABLET | ORAL | 0 refills | Status: DC

## 2019-01-24 MED ORDER — ALPRAZOLAM 0.25 MG PO TABS: 0.2500 mg | ORAL_TABLET | Freq: Every day | ORAL | 2 refills | Status: DC

## 2019-01-24 NOTE — Progress Notes (Signed)
Subjective:    Patient ID: [name], female    DOB: 08-21-1970, 48 y.o.   MRN: [id number]  HPI: [name] is a 47 y.o. female who returns for follow up appointment for chronic pain and medication refill. She states her pain is located in her lower back radiating into her left hip and left lower extremity. Also reports right knee pain. She  rates her pain 9. Her current exercise regime is walking.  [name] Morphine equivalent is 67.50  MME. She  is also prescribed Alprazolam. We have discussed the black box warning of using opioids and benzodiazepines. I highlighted the dangers of using these drugs together and discussed the adverse events including respiratory suppression, overdose, cognitive impairment and importance of compliance with current regimen. We will continue to monitor and adjust as indicated.   Oral Swab was Ordered today.   [name] new insurance will be effective in January 2021, due to her financial hardship we will allow her to return in 2 months, she verbalizes understanding.   Pain Inventory Average Pain 9 Pain Right Now 9 My pain is constant, sharp, burning, stabbing, tingling and aching  In the last 24 hours, has pain interfered with the following? General activity 1 Relation with others 1 Enjoyment of life 0 What TIME of day is your pain at its worst? all Sleep (in general) Poor  Pain is worse with: walking, bending, sitting, inactivity, standing and some activites Pain improves with: medication Relief from Meds: 6  Mobility use a cane ability to climb steps?  no do you drive?  no  Function not employed: date last employed .  Neuro/Psych numbness spasms  Prior Studies Any changes since last visit?  no  Physicians involved in your care Any changes since last visit?  no   Family History  Problem Relation Age of Onset  . Hypertension Maternal Grandmother   . Anesthesia problems Neg Hx    Social History   Socioeconomic History   . Marital status: Divorced    Spouse name: Not on file  . Number of children: Not on file  . Years of education: Not on file  . Highest education level: Not on file  Occupational History  . Not on file  Social Needs  . Financial resource strain: Not on file  . Food insecurity    Worry: Not on file    Inability: Not on file  . Transportation needs    Medical: Not on file    Non-medical: Not on file  Tobacco Use  . Smoking status: Never Smoker  . Smokeless tobacco: Never Used  Substance and Sexual Activity  . Alcohol use: No  . Drug use: No  . Sexual activity: Not Currently    Birth control/protection: None    Comment: IUD removed 2 wks ago.  Lifestyle  . Physical activity    Days per week: Not on file    Minutes per session: Not on file  . Stress: Not on file  Relationships  . Social Herbalist on phone: Not on file    Gets together: Not on file    Attends religious service: Not on file    Active member of club or organization: Not on file    Attends meetings of clubs or organizations: Not on file    Relationship status: Not on file  Other Topics Concern  . Not on file  Social History Narrative  . Not on file   Past Surgical History:  Procedure Laterality Date  . ABDOMINAL HYSTERECTOMY  06/29/11   Robotic Assisted Hysterectomy  . CERVICAL SPINE SURGERY  [place]  . LUMBAR LAMINECTOMY  08/31/2011   Procedure: MICRODISCECTOMY LUMBAR LAMINECTOMY;  Surgeon: Tobi Bastos, MD;  Location: [facility];  Service: Orthopedics;  Laterality: N/A;  lumbar five sacral one central microdisectomy  . MYOMECTOMY  13  . NECK SURGERY    . POSTERIOR CERVICAL FUSION/FORAMINOTOMY N/A 02/02/2013   Procedure: POSTERIOR C5-6 SPINAL FUSION;  Surgeon: Melina Schools, MD;  Location: [place];  Service: Orthopedics;  Laterality: N/A;  . POSTERIOR FUSION CERVICAL SPINE  02/02/2013   C 5 C6     [name]  . WISDOM TOOTH EXTRACTION     Past Medical History:  Diagnosis  Date  . Anemia    hx  . Arthritis   . Complication of anesthesia    slow to wake up,   very difficult to get iv had picc line last time  . Difficult intravenous access    hands are usually best for blood draws and IV starts  . DVT (deep venous thrombosis) ([place])    ? vs lupus  . Fibroids   . Headache(784.0)   . Neck pain   . Scoliosis    BP (!) 143/86   Pulse (!) 108   Temp 97.7 F (36.5 C)   Ht 5\' 5"  (1.651 m)   Wt 255 lb (115.7 kg)   LMP 04/01/2011   SpO2 99%   BMI 42.43 kg/m   Opioid Risk Score:   Fall Risk Score:  `1  Depression screen PHQ 2/9  Depression screen [facility] 2/9 08/24/2018 06/29/2018 05/03/2018 03/07/2018 01/03/2018 09/07/2017 06/07/2017  Decreased Interest 0 0 0 0 0 1 1  Down, Depressed, Hopeless 0 0 0 0 0 1 1  PHQ - 2 Score 0 0 0 0 0 2 2  Altered sleeping - - - - - - -  Tired, decreased energy - - - - - - -  Change in appetite - - - - - - -  Feeling bad or failure about yourself  - - - - - - -  Trouble concentrating - - - - - - -  Moving slowly or fidgety/restless - - - - - - -  Suicidal thoughts - - - - - - -  PHQ-9 Score - - - - - - -  Difficult doing work/chores - - - - - - -     Review of Systems  Constitutional: Positive for diaphoresis and unexpected weight change.  Gastrointestinal: Positive for constipation and nausea.  Genitourinary: Positive for dysuria.  Musculoskeletal: Positive for arthralgias, back pain, gait problem and myalgias.  Neurological: Positive for numbness.  All other systems reviewed and are negative.      Objective:   Physical Exam Vitals signs and nursing note reviewed.  Constitutional:      Appearance: Normal appearance.  Neck:     Musculoskeletal: Normal range of motion and neck supple.  Cardiovascular:     Rate and Rhythm: Normal rate and regular rhythm.     Pulses: Normal pulses.     Heart sounds: Normal heart sounds.  Pulmonary:     Effort: Pulmonary effort is normal.     Breath sounds: Normal breath sounds.   Musculoskeletal:     Comments: Normal Muscle Bulk and Muscle Testing Reveals:  Upper Extremities: Full ROM and Muscle Strength 5/5  Lumbar Hypersensitivity Left Greater Trochanter tenderness Lower  Extremities: Decreased ROM and Muscle Strength 5/5  Right Lower Extremity Flexion Produces Pain into Right Patella Arises from Table Slowly using cane for support Narrow Based Gait   Skin:    General: Skin is warm and dry.  Neurological:     Mental Status: She is alert and oriented to person, place, and time.  Psychiatric:        Mood and Affect: Mood normal.        Behavior: Behavior normal.           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 Status post decompressive laminectomy:Chronic Low Back Pain without Sciatica:Continue to MonitorRefilled:Oxycodone 10mg /325 mg one tablet five times a day as needed#135.01/24/2019 We will continue the opioid monitoring program,this consists of regular clinic visits, examinations, urine drug screen,pill counts as well as use of [place] Controlled Substance Reporting System. 2. Insomnia/ Anxiety: Continuecurrent medication regimen withXanax 01/24/2019 3. Lumbar Radiculopathy/Neuropathic pain: Continuecurrent medication regimen withGabapentin.01/24/2019. 4. Muscle Spasms. Continuecurrent medication regimen with: Tizanidine.01/24/2019 5. Neurogenic bowel and bladder.Continue to monitor.01/24/2019 6. Constipation/Opioid Therapy: Continuecurrent medication regimen with Linzess. 01/24/2019 7. Depression: Continuecurrent medication regimen withCelexa.01/24/2019 8.Left Greater Trochanteric Bursitis:Continue Heat/Ice Therapy. 01/24/2019 9. Chronic Pain Syndrome: Continuecurrent medication regimen withVoltaren.01/24/2019 10. Chronic Right Knee Pain:Refuses cortisone injection at this time. Continue current medication regimen and continue to monitor. .01/24/2019 11.  Cervicalgia/ Cervical Radiculitis:No complaints today. Continue current medication regimenand HEP as tolerated.. Continue to Monitor.01/24/2019  77minutes of face to face patient care time was spent during this visit. All questions were encouraged and answered.  F/U in 1 month

## 2019-01-29 LAB — DRUG TOX MONITOR 1 W/CONF, ORAL FLD
Amphetamines: NEGATIVE ng/mL (ref <10)
Barbiturates: NEGATIVE ng/mL (ref <10)
Benzodiazepines: NEGATIVE ng/mL (ref <0.50)
Buprenorphine: NEGATIVE ng/mL (ref <0.10)
Cocaine: NEGATIVE ng/mL (ref <5.0)
Codeine: NEGATIVE ng/mL (ref <2.5)
Dihydrocodeine: NEGATIVE ng/mL (ref <2.5)
Fentanyl: NEGATIVE ng/mL (ref <0.10)
Heroin Metabolite: NEGATIVE ng/mL (ref <1.0)
Hydrocodone: 3.8 ng/mL — ABNORMAL HIGH (ref <2.5)
Hydromorphone: NEGATIVE ng/mL (ref <2.5)
MARIJUANA: NEGATIVE ng/mL (ref <2.5)
MDMA: NEGATIVE ng/mL (ref <10)
Meprobamate: NEGATIVE ng/mL (ref <2.5)
Methadone: NEGATIVE ng/mL (ref <5.0)
Morphine: NEGATIVE ng/mL (ref <2.5)
Nicotine Metabolite: NEGATIVE ng/mL (ref <5.0)
Norhydrocodone: NEGATIVE ng/mL (ref <2.5)
Noroxycodone: NEGATIVE ng/mL (ref <2.5)
Opiates: POSITIVE ng/mL — AB (ref <2.5)
Oxycodone: NEGATIVE ng/mL (ref <2.5)
Oxymorphone: NEGATIVE ng/mL (ref <2.5)
Phencyclidine: NEGATIVE ng/mL (ref <10)
Tapentadol: NEGATIVE ng/mL (ref <5.0)
Tramadol: NEGATIVE ng/mL (ref <5.0)
Zolpidem: NEGATIVE ng/mL (ref <5.0)

## 2019-01-29 LAB — DRUG TOX ALC METAB W/CON, ORAL FLD: Alcohol Metabolite: NEGATIVE ng/mL (ref <25)

## 2019-01-30 ENCOUNTER — Telehealth: Payer: Self-pay | Admitting: *Deleted

## 2019-01-30 NOTE — Telephone Encounter (Signed)
Oral swab is positive for hydrocodone and she is prescribed oxycodone. Her Oxycodone is absent. She reported taking her oxycodone the am of the test. Please advise.

## 2019-01-30 NOTE — Telephone Encounter (Signed)
Placed a call to Ms. Gosdin regarding UDS results. She is adamant that she hasn't taken any hydrocodone. Will have Sybil call [facility] to speak with Toxicologist and this provider will speak with Dr. Naaman Plummer on Wednesday. Ms. Bonini is aware of the above and verbalizes understanding.

## 2019-02-01 ENCOUNTER — Telehealth: Payer: Self-pay | Admitting: Registered Nurse

## 2019-02-01 DIAGNOSIS — Z5181 Encounter for therapeutic drug level monitoring: Secondary | ICD-10-CM

## 2019-02-01 DIAGNOSIS — G894 Chronic pain syndrome: Secondary | ICD-10-CM

## 2019-02-01 DIAGNOSIS — Z79891 Long term (current) use of opiate analgesic: Secondary | ICD-10-CM

## 2019-02-01 NOTE — Telephone Encounter (Signed)
Placed a call to [name], she was instructed to come to office tomorrow and to bring her oxycodone  medication with her, she verbalizes understanding. She is aware to be her by 3:00 p.m.

## 2019-02-02 NOTE — Addendum Note (Signed)
Addended by: Jasmine December T on: 02/02/2019 02:47 PM   Modules accepted: Orders

## 2019-02-02 NOTE — Telephone Encounter (Signed)
Prescribed #135 on 12/26/2018 counted #0 today Prescribed #135 on 01/24/2019 counted #17 1/2, pt states she has #50 to #60 locked up at home and your son gets out only what needs for week. Son left Monday to go out of town.

## 2019-02-04 LAB — TOXASSURE SELECT,+ANTIDEPR,UR

## 2019-02-06 ENCOUNTER — Telehealth: Payer: Self-pay | Admitting: *Deleted

## 2019-02-06 NOTE — Telephone Encounter (Signed)
Ms. Pulkrabek reported to our office on 02/02/19 for a pill count and repeat drug screen using urine sample per Danella Sensing NP request.  (see previous oral swab results (-) for oxycodone and (+) for unprescribed hydrocodone.  This current urine sample is (-) for oxycodone even though she has pills left. She brought her pill bottle, but did not bring her entire supply-- saying #50-60 were locked up at home. She had 17 1/2 tablets with her. Since she was called to come in and bring her oxycodone, I am not sure why she would not bring entire supply of medication. This urine sample is (-) for oxycodone, but has a low level of THC-- 8ng/ml.  Looking at these results, it would raise the question of whether she is taking her oxycodone.  Both swab and urine samples were negative for oxycodone and the tests were 9 days apart (11/3 vs 11/12). Both tests each had an unprescribed substance in them as well.

## 2019-02-06 NOTE — Telephone Encounter (Signed)
Did she receive a warning before? I would say based on the information provided, (especially with the [facility] now present) that she should be non-narcotic. Doesn't look like she needs a taper either.

## 2019-02-08 ENCOUNTER — Telehealth: Payer: Self-pay | Admitting: Registered Nurse

## 2019-02-08 DIAGNOSIS — Z5181 Encounter for therapeutic drug level monitoring: Secondary | ICD-10-CM

## 2019-02-08 DIAGNOSIS — G894 Chronic pain syndrome: Secondary | ICD-10-CM

## 2019-02-08 DIAGNOSIS — Z79891 Long term (current) use of opiate analgesic: Secondary | ICD-10-CM

## 2019-02-08 NOTE — Telephone Encounter (Signed)
Placed a call to [name] regarding her UDS results. [name] is adamant she doesn't smoke marijuana. I asked [name] what happened when she went to [employer]. She reports she was given a specimen cup, she used the bathroom and she left. This provider asked [name] was her name on the cup she stated there was no label on her specimen cup.  This will be discussed with [name], I explained to [name] I will call her back after speaking with [name], she verbalizes understanding.

## 2019-02-09 NOTE — Telephone Encounter (Signed)
This provider was in contact with Dr Naaman Plummer regarding Ms. [employer] remarks as it relates to [employer].  I also spoke with Godfrey Pick regarding the above.  This provider also spoke with [employer], I asked for the labels to be placed on the specimen cups and to have the patient's to initial the labels, they verbalize understanding. Dr. Naaman Plummer in agreement to have [name] come in for a UDS today.  This provider placed a call to Ms. Hugh, regarding the above, she verbalizes understanding.

## 2019-02-09 NOTE — Addendum Note (Signed)
Addended by: Marland Mcalpine B on: 02/09/2019 02:57 PM   Modules accepted: Orders

## 2019-02-09 NOTE — Addendum Note (Signed)
Addended by: Jasmine December T on: 02/09/2019 02:55 PM   Modules accepted: Orders

## 2019-02-13 LAB — TOXASSURE SELECT,+ANTIDEPR,UR

## 2019-02-20 ENCOUNTER — Telehealth: Payer: Self-pay | Admitting: *Deleted

## 2019-02-20 NOTE — Telephone Encounter (Signed)
Urine test is positive for oxycodone but no metabolites. A moderate to large amount of oxycodone is present; the metabolites oxymorphone, noroxycodone and noroxymorphone are not present. This is an atypical result. Considering the previous two tests this month had no oxycodone, this test does not support that she is taking her oxycodone as ordered. (up to 5x per day).

## 2019-04-03 ENCOUNTER — Encounter: Payer: Self-pay | Admitting: Registered Nurse

## 2019-04-03 ENCOUNTER — Encounter: Payer: 59 | Attending: Physical Medicine and Rehabilitation | Admitting: Registered Nurse

## 2019-04-03 ENCOUNTER — Other Ambulatory Visit: Payer: Self-pay

## 2019-04-03 VITALS — BP 146/87 | HR 85 | Temp 97.2°F | Ht 65.0 in | Wt 267.0 lb

## 2019-04-03 DIAGNOSIS — G834 Cauda equina syndrome: Secondary | ICD-10-CM | POA: Diagnosis present

## 2019-04-03 DIAGNOSIS — M7062 Trochanteric bursitis, left hip: Secondary | ICD-10-CM | POA: Diagnosis present

## 2019-04-03 DIAGNOSIS — M5126 Other intervertebral disc displacement, lumbar region: Secondary | ICD-10-CM | POA: Diagnosis present

## 2019-04-03 DIAGNOSIS — Z5181 Encounter for therapeutic drug level monitoring: Secondary | ICD-10-CM | POA: Diagnosis present

## 2019-04-03 DIAGNOSIS — Z79891 Long term (current) use of opiate analgesic: Secondary | ICD-10-CM | POA: Diagnosis present

## 2019-04-03 DIAGNOSIS — M5416 Radiculopathy, lumbar region: Secondary | ICD-10-CM | POA: Diagnosis not present

## 2019-04-03 DIAGNOSIS — M48061 Spinal stenosis, lumbar region without neurogenic claudication: Secondary | ICD-10-CM | POA: Diagnosis present

## 2019-04-03 DIAGNOSIS — G894 Chronic pain syndrome: Secondary | ICD-10-CM | POA: Insufficient documentation

## 2019-04-03 DIAGNOSIS — M7061 Trochanteric bursitis, right hip: Secondary | ICD-10-CM | POA: Diagnosis not present

## 2019-04-03 DIAGNOSIS — F411 Generalized anxiety disorder: Secondary | ICD-10-CM

## 2019-04-03 DIAGNOSIS — F5101 Primary insomnia: Secondary | ICD-10-CM | POA: Diagnosis present

## 2019-04-03 DIAGNOSIS — M62830 Muscle spasm of back: Secondary | ICD-10-CM

## 2019-04-03 MED ORDER — ALPRAZOLAM 0.5 MG PO TABS: 0.5000 mg | ORAL_TABLET | Freq: Every day | ORAL | 2 refills | Status: DC | PRN

## 2019-04-03 MED ORDER — DICLOFENAC SODIUM 75 MG PO TBEC: 75.0000 mg | DELAYED_RELEASE_TABLET | Freq: Two times a day (BID) | ORAL | 3 refills | Status: DC

## 2019-04-03 MED ORDER — GABAPENTIN 300 MG PO CAPS: ORAL_CAPSULE | ORAL | 3 refills | Status: DC

## 2019-04-03 MED ORDER — CITALOPRAM HYDROBROMIDE 10 MG PO TABS: 10.0000 mg | ORAL_TABLET | Freq: Every day | ORAL | 3 refills | Status: DC

## 2019-04-03 MED ORDER — TIZANIDINE HCL 4 MG PO TABS: ORAL_TABLET | ORAL | 3 refills | Status: DC

## 2019-04-03 MED ORDER — OXYCODONE-ACETAMINOPHEN 10-325 MG PO TABS: ORAL_TABLET | ORAL | 0 refills | Status: DC

## 2019-04-03 NOTE — Progress Notes (Signed)
Subjective:    Patient ID: [name], female    DOB: 1970/12/27, 49 y.o.   MRN: [id number]  HPI: [name] is a 49 y.o. female who returns for follow up appointment for chronic pain and medication refill. She states her pain is located in her lower back radiating into her left hip and left lower extremities. She rates her pain 9. Her current exercise regime is walking and performing stretching exercises.  Ms. Leas Morphine equivalent is 75.00 MME.She  is also prescribed Alprazolam. We have discussed the black box warning of using opioids and benzodiazepines. I highlighted the dangers of using these drugs together and discussed the adverse events including respiratory suppression, overdose, cognitive impairment and importance of compliance with current regimen. We will continue to monitor and adjust as indicated.    Last UDS was performed on 02/09/2019, see note for details.   Pain Inventory Average Pain 8 Pain Right Now 9 My pain is sharp, dull, stabbing, tingling and aching  In the last 24 hours, has pain interfered with the following? General activity 8 Relation with others 8 Enjoyment of life 9 What TIME of day is your pain at its worst? all Sleep (in general) Poor  Pain is worse with: walking, bending, sitting, standing and some activites Pain improves with: rest, heat/ice and medication Relief from Meds: 5  Mobility walk with assistance use a cane how many minutes can you walk? 2 to 5 ability to climb steps?  yes  Function not employed: date last employed . I need assistance with the following:  dressing, meal prep, household duties and shopping  Neuro/Psych tingling trouble walking spasms depression anxiety  Prior Studies Any changes since last visit?  no  Physicians involved in your care Any changes since last visit?  no   Family History  Problem Relation Age of Onset  . Hypertension Maternal Grandmother   . Anesthesia problems Neg Hx     Social History   Socioeconomic History  . Marital status: Divorced    Spouse name: Not on file  . Number of children: Not on file  . Years of education: Not on file  . Highest education level: Not on file  Occupational History  . Not on file  Tobacco Use  . Smoking status: Never Smoker  . Smokeless tobacco: Never Used  Substance and Sexual Activity  . Alcohol use: No  . Drug use: No  . Sexual activity: Not Currently    Birth control/protection: None    Comment: IUD removed 2 wks ago.  Other Topics Concern  . Not on file  Social History Narrative  . Not on file   Social Determinants of Health   Financial Resource Strain:   . Difficulty of Paying Living Expenses: Not on file  Food Insecurity:   . Worried About Charity fundraiser in the Last Year: Not on file  . Ran Out of Food in the Last Year: Not on file  Transportation Needs:   . Lack of Transportation (Medical): Not on file  . Lack of Transportation (Non-Medical): Not on file  Physical Activity:   . Days of Exercise per Week: Not on file  . Minutes of Exercise per Session: Not on file  Stress:   . Feeling of Stress : Not on file  Social Connections:   . Frequency of Communication with Friends and Family: Not on file  . Frequency of Social Gatherings with Friends and Family: Not on file  . Attends Religious Services: Not  on file  . Active Member of Clubs or Organizations: Not on file  . Attends Archivist Meetings: Not on file  . Marital Status: Not on file   Past Surgical History:  Procedure Laterality Date  . ABDOMINAL HYSTERECTOMY  06/29/11   Robotic Assisted Hysterectomy  . CERVICAL SPINE SURGERY  [place]  . LUMBAR LAMINECTOMY  08/31/2011   Procedure: MICRODISCECTOMY LUMBAR LAMINECTOMY;  Surgeon: Tobi Bastos, MD;  Location: [facility];  Service: Orthopedics;  Laterality: N/A;  lumbar five sacral one central microdisectomy  . MYOMECTOMY  13  . NECK SURGERY    . POSTERIOR  CERVICAL FUSION/FORAMINOTOMY N/A 02/02/2013   Procedure: POSTERIOR C5-6 SPINAL FUSION;  Surgeon: Melina Schools, MD;  Location: [facility];  Service: Orthopedics;  Laterality: N/A;  . POSTERIOR FUSION CERVICAL SPINE  02/02/2013   C 5 C6     [name]  . WISDOM TOOTH EXTRACTION     Past Medical History:  Diagnosis Date  . Anemia    hx  . Arthritis   . Complication of anesthesia    slow to wake up,   very difficult to get iv had picc line last time  . Difficult intravenous access    hands are usually best for blood draws and IV starts  . DVT (deep venous thrombosis) ([place])    ? vs lupus  . Fibroids   . Headache(784.0)   . Neck pain   . Scoliosis    BP (!) 146/87   Pulse 85   Temp (!) 97.2 F (36.2 C)   Ht 5\' 5"  (1.651 m)   Wt 267 lb (121.1 kg)   LMP 04/01/2011   SpO2 98%   BMI 44.43 kg/m   Opioid Risk Score:   Fall Risk Score:  `1  Depression screen PHQ 2/9  Depression screen [facility] 2/9 08/24/2018 06/29/2018 05/03/2018 03/07/2018 01/03/2018 09/07/2017 06/07/2017  Decreased Interest 0 0 0 0 0 1 1  Down, Depressed, Hopeless 0 0 0 0 0 1 1  PHQ - 2 Score 0 0 0 0 0 2 2  Altered sleeping - - - - - - -  Tired, decreased energy - - - - - - -  Change in appetite - - - - - - -  Feeling bad or failure about yourself  - - - - - - -  Trouble concentrating - - - - - - -  Moving slowly or fidgety/restless - - - - - - -  Suicidal thoughts - - - - - - -  PHQ-9 Score - - - - - - -  Difficult doing work/chores - - - - - - -    Review of Systems  Constitutional: Positive for diaphoresis and unexpected weight change.  Gastrointestinal: Positive for abdominal pain, constipation and nausea.  Musculoskeletal: Positive for back pain.       Leg pain  Neurological: Positive for weakness and numbness.  All other systems reviewed and are negative.      Objective:   Physical Exam Vitals and nursing note reviewed.  Constitutional:      Appearance: Normal appearance. She is obese.  Cardiovascular:       Rate and Rhythm: Normal rate and regular rhythm.     Pulses: Normal pulses.     Heart sounds: Normal heart sounds.  Pulmonary:     Effort: Pulmonary effort is normal.     Breath sounds: Normal breath sounds.  Musculoskeletal:  Cervical back: Normal range of motion and neck supple.     Comments: Normal Muscle Bulk and Muscle Testing Reveals:  Upper Extremities: Full ROM and Muscle Strength 5/5  Lumbar Paraspinal Tenderness: L-3-L-5 Left Greater Trochanter Tenderness Lower Extremities: Full ROM and Muscle Strength 5/5 Left Lower Extremity Flexion Produces Pain into her Lumbar, left hip and left lower extremity Arises from Table slowly using cane for support Antalgic Gait   Skin:    General: Skin is warm and dry.     Findings: Erythema:    Neurological:     Mental Status: She is alert and oriented to person, place, and time.  Psychiatric:        Mood and Affect: Mood normal.        Behavior: Behavior normal.           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 Status post decompressive laminectomy:Chronic Low Back Pain without Sciatica:Continue to MonitorRefilled:Oxycodone 10mg /325 mg one tablet five times a day as needed#135.04/03/2019 We will continue the opioid monitoring program,this consists of regular clinic visits, examinations, urine drug screen,pill counts as well as use of [place] Controlled Substance Reporting System. 2. Insomnia/ Anxiety: Experiencing Increased Anxiety with Pandemic and current dose of Xanax ineffective. Increased Xanax to 0.5 mg at HS, she verbalizes understanding. 04/03/2019 3. Lumbar Radiculopathy/Neuropathic pain: Continuecurrent medication regimen withGabapentin.04/03/2019. 4. Muscle Spasms. Continuecurrent medication regimen with: Tizanidine.04/03/2019 5. Neurogenic bowel and bladder.Continue to monitor.04/03/2019 6. Constipation/Opioid Therapy: Continuecurrent  medication regimen with Linzess. 04/03/2019 7. Depression: Continuecurrent medication regimen withCelexa.04/03/2019 8.LeftGreater Trochanteric Bursitis:Continue Heat/Ice Therapy. 04/03/2019 9. Chronic Pain Syndrome: Continuecurrent medication regimen withVoltaren.04/03/2019 10. Chronic Right Knee Pain:No complaints today. Continue current medication regimenand continue to monitor.04/03/2019 11. Cervicalgia/ Cervical Radiculitis:No complaints today.Continue current medication regimenand HEP as tolerated.. Continue to Monitor.04/03/2019  67minutes of face to face patient care time was spent during this visit. All questions were encouraged and answered.  F/U in 1 month

## 2019-04-30 ENCOUNTER — Emergency Department (HOSPITAL_COMMUNITY): Payer: 59

## 2019-04-30 ENCOUNTER — Other Ambulatory Visit: Payer: Self-pay

## 2019-04-30 ENCOUNTER — Emergency Department (HOSPITAL_COMMUNITY)
Admission: EM | Admit: 2019-04-30 | Discharge: 2019-04-30 | Disposition: A | Discharge status: Home / Self Care | Payer: 59 | Attending: Emergency Medicine | Admitting: Emergency Medicine

## 2019-04-30 DIAGNOSIS — R071 Chest pain on breathing: Secondary | ICD-10-CM | POA: Insufficient documentation

## 2019-04-30 DIAGNOSIS — Z79899 Other long term (current) drug therapy: Secondary | ICD-10-CM | POA: Insufficient documentation

## 2019-04-30 DIAGNOSIS — R0789 Other chest pain: Secondary | ICD-10-CM | POA: Diagnosis present

## 2019-04-30 DIAGNOSIS — R079 Chest pain, unspecified: Secondary | ICD-10-CM

## 2019-04-30 DIAGNOSIS — I10 Essential (primary) hypertension: Secondary | ICD-10-CM | POA: Insufficient documentation

## 2019-04-30 DIAGNOSIS — R0602 Shortness of breath: Secondary | ICD-10-CM | POA: Diagnosis not present

## 2019-04-30 DIAGNOSIS — M3210 Systemic lupus erythematosus, organ or system involvement unspecified: Secondary | ICD-10-CM | POA: Diagnosis not present

## 2019-04-30 LAB — CBC
HCT: 37.2 % (ref 36.0–46.0)
Hemoglobin: 12 g/dL (ref 12.0–15.0)
MCH: 29.2 pg (ref 26.0–34.0)
MCHC: 32.3 g/dL (ref 30.0–36.0)
MCV: 90.5 fL (ref 80.0–100.0)
Platelets: 218 10*3/uL (ref 150–400)
RBC: 4.11 MIL/uL (ref 3.87–5.11)
RDW: 13.2 % (ref 11.5–15.5)
WBC: 4.8 10*3/uL (ref 4.0–10.5)
nRBC: 0 % (ref 0.0–0.2)

## 2019-04-30 LAB — BASIC METABOLIC PANEL
Anion gap: 10 (ref 5–15)
BUN: 11 mg/dL (ref 6–20)
CO2: 23 mmol/L (ref 22–32)
Calcium: 8.8 mg/dL — ABNORMAL LOW (ref 8.9–10.3)
Chloride: 106 mmol/L (ref 98–111)
Creatinine, Ser: 0.72 mg/dL (ref 0.44–1.00)
GFR calc Af Amer: >60 mL/min (ref >60)
GFR calc non Af Amer: >60 mL/min (ref >60)
Glucose, Bld: 95 mg/dL (ref 70–99)
Potassium: 3.9 mmol/L (ref 3.5–5.1)
Sodium: 139 mmol/L (ref 135–145)

## 2019-04-30 LAB — I-STAT BETA HCG BLOOD, ED (MC, WL, AP ONLY): I-stat hCG, quantitative: <5 m[IU]/mL (ref <5)

## 2019-04-30 LAB — TROPONIN I (HIGH SENSITIVITY)
Troponin I (High Sensitivity): 3 ng/L (ref <18)
Troponin I (High Sensitivity): <2 ng/L (ref <18)

## 2019-04-30 LAB — D-DIMER, QUANTITATIVE: D-Dimer, Quant: 0.84 ug/mL-FEU — ABNORMAL HIGH (ref 0.00–0.50)

## 2019-04-30 MED ORDER — DIPHENHYDRAMINE HCL 25 MG PO CAPS: 50.0000 mg | ORAL_CAPSULE | Freq: Once | ORAL | Status: AC

## 2019-04-30 MED ORDER — DIPHENHYDRAMINE HCL 50 MG/ML IJ SOLN
INTRAMUSCULAR | Status: AC
  Administered 2019-04-30: 50 mg via INTRAVENOUS
  Filled 2019-04-30: qty 1

## 2019-04-30 MED ORDER — MORPHINE SULFATE (PF) 4 MG/ML IV SOLN
4.0000 mg | Freq: Once | INTRAVENOUS | Status: AC
  Administered 2019-04-30: 4 mg via INTRAVENOUS
  Filled 2019-04-30: qty 1

## 2019-04-30 MED ORDER — IOHEXOL 350 MG/ML SOLN
80.0000 mL | Freq: Once | INTRAVENOUS | Status: AC | PRN
  Administered 2019-04-30: 80 mL via INTRAVENOUS

## 2019-04-30 MED ORDER — HYDROCORTISONE NA SUCCINATE PF 250 MG IJ SOLR
200.0000 mg | Freq: Once | INTRAMUSCULAR | Status: AC
  Administered 2019-04-30: 200 mg via INTRAVENOUS
  Filled 2019-04-30: qty 200

## 2019-04-30 MED ORDER — ONDANSETRON HCL 4 MG/2ML IJ SOLN
4.0000 mg | Freq: Once | INTRAMUSCULAR | Status: AC
  Administered 2019-04-30: 4 mg via INTRAVENOUS
  Filled 2019-04-30: qty 2

## 2019-04-30 MED ORDER — DIPHENHYDRAMINE HCL 50 MG/ML IJ SOLN
50.0000 mg | Freq: Once | INTRAMUSCULAR | Status: AC
  Administered 2019-04-30: 50 mg via INTRAVENOUS
  Filled 2019-04-30: qty 1

## 2019-04-30 NOTE — ED Provider Notes (Addendum)
[name] Provider Note   CSN: [id number] Arrival date & time: 04/30/19  1211     History Chief Complaint  Patient presents with  . Chest Pain    [name] is a 49 y.o. female.  [name] is a 49 y.o. female with a history of lupus, uterine fibroids, arthritis and degenerative disc disease, who presents to the emergency department for evaluation of sharp left-sided chest pain.  Patient reports the pain began this morning after she showered while she was brushing her teeth she had sudden onset of left-sided sharp chest pain that radiated to her left arm, no radiation to the neck or back.  She reports pain continued throughout the morning and did not improve.  Pain is worse with deep breath as well as with certain movements.  Pain is not made worse with exertion.  She reports she feels a bit short of breath especially with activity with this pain, no associated cough or fever.  No associated abdominal pain.  She denies any lower extremity swelling or pain.  She has never had similar pain before.  Denies history of heart issues has never seen a cardiologist.  Denies history of hypertension, high cholesterol or diabetes.  Denies smoking, alcohol or drug use.  Denies any history of blood clots, denies any recent long distance travel or surgery, patient is not on any OCPs.        Past Medical History:  Diagnosis Date  . Anemia    hx  . Arthritis   . Complication of anesthesia    slow to wake up,   very difficult to get iv had picc line last time  . Difficult intravenous access    hands are usually best for blood draws and IV starts  . DVT (deep venous thrombosis) ([place])    ? vs lupus  . Fibroids   . Headache(784.0)   . Neck pain   . Scoliosis     Patient Active Problem List   Diagnosis Date Noted  . Greater trochanteric bursitis of right hip 02/12/2015  . Metabolic syndrome 123456  . Other constipation 05/24/2014  . Greater  trochanteric bursitis of left hip 09/08/2013  . UTI (urinary tract infection) 09/03/2011  . Hypokalemia 09/03/2011  . Cauda equina syndrome ([place]) 09/03/2011  . Spinal stenosis of lumbar region 08/31/2011  . Lumbar herniated disc 08/31/2011  . Abdominal pain, acute, generalized 08/28/2011  . Nausea and vomiting 08/28/2011  . Dehydration 08/28/2011  . S/P hysterectomy 4/13 08/28/2011  . Menometrorrhagia 05/21/2011  . DVT of right leg (deep venous thrombosis) in 01/2011 05/21/2011  . Fibroid uterus 03/20/2011  . LBP (low back pain) 05/29/2010  . Avitaminosis D 04/23/2010  . Benign essential HTN 04/21/2010  . Essential (primary) hypertension 04/21/2010  . Allergic rhinitis 11/01/2009  . Absolute anemia 11/01/2009  . Headache, migraine 11/01/2009  . Adiposity 11/01/2009    Past Surgical History:  Procedure Laterality Date  . ABDOMINAL HYSTERECTOMY  06/29/11   Robotic Assisted Hysterectomy  . CERVICAL SPINE SURGERY  [place]  . LUMBAR LAMINECTOMY  08/31/2011   Procedure: MICRODISCECTOMY LUMBAR LAMINECTOMY;  Surgeon: Tobi Bastos, MD;  [facility];  Service: Orthopedics;  Laterality: N/A;  lumbar five sacral one central microdisectomy  . MYOMECTOMY  13  . NECK SURGERY    . POSTERIOR CERVICAL FUSION/FORAMINOTOMY N/A 02/02/2013   Procedure: POSTERIOR C5-6 SPINAL FUSION;  Surgeon: Melina Schools, MD;  Location: [place];  Service: Orthopedics;  Laterality: N/A;  . POSTERIOR FUSION CERVICAL SPINE  02/02/2013   C 5 C6     [name]  . WISDOM TOOTH EXTRACTION       OB History    Gravida  2   Para  1   Term  1   Preterm  0   AB  1   Living  1     SAB  1   TAB  0   Ectopic  0   Multiple  0   Live Births              Family History  Problem Relation Age of Onset  . Hypertension Maternal Grandmother   . Anesthesia problems Neg Hx     Social History   Tobacco Use  . Smoking status: Never Smoker  . Smokeless tobacco: Never Used    Substance Use Topics  . Alcohol use: No  . Drug use: No    Home Medications Prior to Admission medications   Medication Sig Start Date End Date Taking? Authorizing Provider  ALPRAZolam Duanne Moron) 0.5 MG tablet Take 1 tablet (0.5 mg total) by mouth daily as needed for anxiety. Once a day as needed for anxiety 04/03/19   Bayard Hugger, NP  cetirizine (ZYRTEC) 10 MG tablet Take 1 tablet (10 mg total) by mouth daily. 05/22/14   Dorena Dew, FNP  citalopram (CELEXA) 10 MG tablet Take 1 tablet (10 mg total) by mouth daily. 04/03/19   Bayard Hugger, NP  diclofenac (VOLTAREN) 75 MG EC tablet Take 1 tablet (75 mg total) by mouth 2 (two) times daily with a meal. 04/03/19   Bayard Hugger, NP  ergocalciferol (DRISDOL) 50000 UNITS capsule Take 1 capsule (50,000 Units total) by mouth once a week. 05/30/14   Dorena Dew, FNP  gabapentin (NEURONTIN) 300 MG capsule One capsule in the morning, afternoon, dinner and two capsules at bedtime. 04/03/19   Bayard Hugger, NP  linaclotide [facility]) 290 MCG CAPS capsule Take 1 capsule (290 mcg total) by mouth daily. 09/28/16   Tresa Garter, MD  oxyCODONE-acetaminophen (PERCOCET) 10-325 MG tablet Take 1 tablet (10/325 mg total) by mouth 5 (five) times daily as needed. 04/03/19   Bayard Hugger, NP  phenazopyridine (PYRIDIUM) 100 MG tablet Pyridium 100 mg tablet  Take 1 tablet 3 times a day by oral route.    [provider]  phentermine (ADIPEX-P) 37.5 MG tablet Take 37.5 mg by mouth every morning. 12/11/14   [provider]  promethazine (PHENERGAN) 12.5 MG tablet Take 1 tablet (12.5 mg total) by mouth every 6 (six) hours as needed for nausea or vomiting. 03/08/17   Bayard Hugger, NP  tiZANidine (ZANAFLEX) 4 MG tablet May take 1- 2 tablets every 8 hours as needed for muscle spasms 04/03/19   Bayard Hugger, NP    Allergies    Codeine, Iodine, Penicillins, Shrimp [shellfish allergy], Contrast media [iodinated diagnostic agents],  and Meperidine and related  Review of Systems   Review of Systems  Constitutional: Negative for chills and fever.  Respiratory: Positive for shortness of breath. Negative for cough.   Cardiovascular: Positive for chest pain. Negative for palpitations and leg swelling.  Gastrointestinal: Negative for abdominal pain, nausea and vomiting.  Genitourinary: Negative for dysuria, flank pain and frequency.  Musculoskeletal: Negative for arthralgias and myalgias.  Skin: Negative for color change and rash.  Neurological: Negative for dizziness, syncope and light-headedness.    Physical  Exam Updated Vital Signs BP (!) 143/85 (BP Location: Right Wrist)   Pulse 76   Temp 98.4 F (36.9 C) (Oral)   Resp 18   Ht 5\' 5"  (1.651 m)   Wt 111.1 kg   LMP 04/01/2011   SpO2 100%   BMI 40.77 kg/m   Physical Exam Vitals and nursing note reviewed.  Constitutional:      General: She is not in acute distress.    Appearance: She is well-developed and normal weight. She is not ill-appearing or diaphoretic.  HENT:     Head: Normocephalic and atraumatic.  Eyes:     General:        Right eye: No discharge.        Left eye: No discharge.     Pupils: Pupils are equal, round, and reactive to light.  Cardiovascular:     Rate and Rhythm: Normal rate and regular rhythm.     Pulses:          Radial pulses are 2+ on the right side and 2+ on the left side.       Dorsalis pedis pulses are 2+ on the right side and 2+ on the left side.     Heart sounds: Normal heart sounds. No murmur. No friction rub. No gallop.   Pulmonary:     Effort: Pulmonary effort is normal. No respiratory distress.     Breath sounds: Normal breath sounds. No wheezing or rales.     Comments: Respirations equal and unlabored, patient able to speak in full sentences, lungs clear to auscultation bilaterally, left chest wall is tender to palpation, this reproduces pain patient has been experiencing.  No overlying skin changes or palpable  deformity Chest:     Chest wall: Tenderness present.  Abdominal:     General: Bowel sounds are normal. There is no distension.     Palpations: Abdomen is soft. There is no mass.     Tenderness: There is no abdominal tenderness. There is no guarding.     Comments: Respirations equal and unlabored, patient able to speak in full sentences, lungs clear to auscultation bilaterally  Musculoskeletal:        General: No deformity.     Cervical back: Neck supple.     Right lower leg: No tenderness. No edema.     Left lower leg: No tenderness. No edema.     Comments: Bilateral lower extremities warm and well perfused without tenderness or edema  Skin:    General: Skin is warm and dry.     Capillary Refill: Capillary refill takes less than 2 seconds.  Neurological:     Mental Status: She is alert.     Coordination: Coordination normal.     Comments: Speech is clear, able to follow commands Moves extremities without ataxia, coordination intact  Psychiatric:        Mood and Affect: Mood normal.        Behavior: Behavior normal.     ED Results / Procedures / Treatments   Labs (all labs ordered are listed, but only abnormal results are displayed) Labs Reviewed  BASIC METABOLIC PANEL - Abnormal; Notable for the following components:      Result Value   Calcium 8.8 (*)    All other components within normal limits  D-DIMER, QUANTITATIVE (NOT AT [facility]) - Abnormal; Notable for the following components:   D-Dimer, Quant 0.84 (*)    All other components within normal limits  CBC  I-STAT BETA HCG BLOOD,  ED ([facility], [facility], [facility] ONLY)  TROPONIN I (HIGH SENSITIVITY)  TROPONIN I (HIGH SENSITIVITY)    EKG EKG Interpretation  Date/Time:  Sunday April 30 2019 12:17:35 EST Ventricular Rate:  74 PR Interval:    QRS Duration: 100 QT Interval:  377 QTC Calculation: 419 R Axis:   -2 Text Interpretation: Sinus rhythm Abnormal R-wave progression, early transition Minimal ST depression Baseline wander in  lead(s) V1 No STEMI visualized on remaining leads Confirmed by Octaviano Glow [phone]) on 04/30/2019 2:57:50 PM   Radiology DG Chest 2 View  Result Date: 04/30/2019 CLINICAL DATA:  New onset chest pain EXAM: CHEST - 2 VIEW COMPARISON:  08/30/2011 FINDINGS: The lungs are clear without focal pneumonia, edema, pneumothorax or pleural effusion. The cardiopericardial silhouette is within normal limits for size. The visualized bony structures of the thorax are intact. Telemetry leads overlie the chest. IMPRESSION: No active cardiopulmonary disease. Electronically Signed   By: Misty Stanley M.D.   On: 04/30/2019 13:04    Procedures Procedures (including critical care time)  Medications Ordered in ED Medications  hydrocortisone sodium succinate (SOLU-CORTEF) injection 200 mg (has no administration in time range)  morphine 4 MG/ML injection 4 mg (has no administration in time range)  morphine 4 MG/ML injection 4 mg (4 mg Intravenous Given 04/30/19 1333)  ondansetron (ZOFRAN) injection 4 mg (4 mg Intravenous Given 04/30/19 1333)  diphenhydrAMINE (BENADRYL) capsule 50 mg ( Oral See Alternative 04/30/19 1342)    Or  diphenhydrAMINE (BENADRYL) injection 50 mg (50 mg Intravenous Given 04/30/19 1342)    ED Course  I have reviewed the triage vital signs and the nursing notes.  Pertinent labs & imaging results that were available during my care of the patient were reviewed by me and considered in my medical decision making (see chart for details).    MDM Rules/Calculators/A&P                      Patient presents to the emergency department with chest pain. Patient nontoxic appearing, in no apparent distress, vitals without significant abnormality. Fairly benign physical exam.  Chest pain is reproducible with certain movements and palpation over the left side of the chest.  Prior heart catheterization reviewed: none Patient's primary cardiologist: none  DDX: ACS, pulmonary embolism, dissection,  pneumothorax, pneumonia, arrhythmia, severe anemia, MSK, GERD, anxiety. Evaluation initiated with labs, EKG, and CXR. Patient on cardiac monitor.   CBC: No leukocytosis and normal hemoglobin. BMP: No significant electrolyte derangements, normal renal function Troponin: Initial troponin III EKG: EKG shows sinus rhythm with some nonspecific ST changes CXR: Negative, without infiltrate, effusion, pneumothorax, or fracture/dislocation.   [place] Pathway Score 3- EKG without obvious acute ischemia, delta troponin pending, but doubt ACS.  Given pleuritic pain concern for PE, patient does not have significant PE risk factors so D-dimer ordered for evaluation.  Patient's D-dimer is elevated, she has a contrast allergy so is being premedicated for CTA, pending at shift change. Pain is not a tearing sensation, symmetric pulses, no widening of mediastinum on CXR, doubt dissection. Cardiac monitor reviewed, no notable arrhythmias or tachycardia. Patient has appeared hemodynamically stable throughout ER visit thus far.  Care signed out to PA Providence Lanius at shift change who will follow up on delta troponin and CTA, if CT scan is negative feel patient will be stable for discharge home with treatment for likely musculoskeletal chest pain and close PCP follow-up.  Final Clinical Impression(s) / ED Diagnoses Final diagnoses:  Left-sided chest pain  Rx / DC Orders ED Discharge Orders    None          Jacqlyn Larsen, [place] 04/30/19 1624    Wyvonnia Dusky, MD 04/30/19 [phone]

## 2019-04-30 NOTE — ED Notes (Signed)
Patient verbalizes understanding of discharge instructions. Opportunity for questioning and answers were provided.  pt discharged from ED, ambulatory by self   

## 2019-04-30 NOTE — Discharge Instructions (Signed)
Take your muscle relaxers as directed.  As we discussed, follow-up with your primary care doctor.  Return to the Emergency Department immediately if you experiencing worsening chest pain, difficulty breathing, nausea/vomiting, get very sweaty, headache or any other worsening or concerning symptoms.

## 2019-04-30 NOTE — ED Notes (Signed)
Pt ambulated to RR1.

## 2019-04-30 NOTE — ED Notes (Signed)
Pt to CT

## 2019-04-30 NOTE — ED Provider Notes (Signed)
Care assumed from [facility], [place] at shift change with CTA chest pending.   In brief, this patient is a 49 y.o. F who presents for evaluation of left-sided chest pain that began this morning after shower.  She describes it as a sharp chest pain that is worse with movement and deep inspiration.  Patient with no cardiac risk factors other than weight.  Her pain is reproduced with palpation.  Please see note from previous provider for full history/physical exam.   Physical Exam  BP 122/73 (BP Location: Right Wrist)   Pulse 84   Temp 98.3 F (36.8 C) (Oral)   Resp 17   Ht 5\' 5"  (1.651 m)   Wt 111.1 kg   LMP 04/01/2011   SpO2 98%   BMI 40.77 kg/m   Physical Exam  ED Course/Procedures     Procedures  Results for orders placed or performed during the hospital encounter of 04/30/19 (from the past 24 hour(s))  Basic metabolic panel     Status: Abnormal   Collection Time: 04/30/19 12:27 PM  Result Value Ref Range   Sodium 139 135 - 145 mmol/L   Potassium 3.9 3.5 - 5.1 mmol/L   Chloride 106 98 - 111 mmol/L   CO2 23 22 - 32 mmol/L   Glucose, Bld 95 70 - 99 mg/dL   BUN 11 6 - 20 mg/dL   Creatinine, Ser 0.72 0.44 - 1.00 mg/dL   Calcium 8.8 (L) 8.9 - 10.3 mg/dL   GFR calc non Af Amer >60 >60 mL/min   GFR calc Af Amer >60 >60 mL/min   Anion gap 10 5 - 15  CBC     Status: None   Collection Time: 04/30/19 12:27 PM  Result Value Ref Range   WBC 4.8 4.0 - 10.5 K/uL   RBC 4.11 3.87 - 5.11 MIL/uL   Hemoglobin 12.0 12.0 - 15.0 g/dL   HCT 37.2 36.0 - 46.0 %   MCV 90.5 80.0 - 100.0 fL   MCH 29.2 26.0 - 34.0 pg   MCHC 32.3 30.0 - 36.0 g/dL   RDW 13.2 11.5 - 15.5 %   Platelets 218 150 - 400 K/uL   nRBC 0.0 0.0 - 0.2 %  Troponin I (High Sensitivity)     Status: None   Collection Time: 04/30/19 12:27 PM  Result Value Ref Range   Troponin I (High Sensitivity) 3 <18 ng/L  I-Stat beta hCG blood, ED     Status: None   Collection Time: 04/30/19 12:35 PM  Result Value Ref Range   I-stat  hCG, quantitative <5.0 <5 mIU/mL   Comment 3          D-dimer, quantitative (not at [facility])     Status: Abnormal   Collection Time: 04/30/19 12:55 PM  Result Value Ref Range   D-Dimer, Quant 0.84 (H) 0.00 - 0.50 ug/mL-FEU  Troponin I (High Sensitivity)     Status: None   Collection Time: 04/30/19  2:54 PM  Result Value Ref Range   Troponin I (High Sensitivity) <2 <18 ng/L     MDM   PLAN: Patient with low heart score.  Sounds atypical pain as it is worse with movement and reproduced with palpation.  She is pending a CTA.  If negative, she can go home.  MDM:  Delta troponin is negative.  CTA of chest shows no evidence of acute PE.  No acute pulmonary disease.  I discussed results with patient.  Patient is hemodynamically stable.  Patient  stable for discharge at this time.  We will plan to treat as muscle pain.  Patient already takes muscle relaxers.  Encouraged her to take that for her pain.  Patient will make an appointment with her primary care doctor for follow-up. At this time, patient exhibits no emergent life-threatening condition that require further evaluation in ED or admission. Patient had ample opportunity for questions and discussion. All patient's questions were answered with full understanding. Strict return precautions discussed. Patient expresses understanding and agreement to plan.    1. Left-sided chest pain     Portions of this note were generated with Dragon dictation software. Dictation errors may occur despite best attempts at proofreading.     Volanda Napoleon, PA-C 04/30/19 Nezperce, Ankit, MD 04/30/19 [phone]

## 2019-04-30 NOTE — ED Triage Notes (Signed)
Patient arrives via EMS due c/o of new onset chest pain that start around 1000 today. Per the patient, she was having chest pain this morning while getting ready to go to church and she tried to manage it with two aleve. The patient then went to church, where her chest pain gradually increased. Patient reports that she is having left sided chest pain (sharp-stabbing) that radiates down her left arm. Rates the pain a 10/10.  Patient given 324mg  Asprin and Nitro 0.4mg  x1 with no relief. Patient arrives with 18g R. AC

## 2019-04-30 NOTE — ED Notes (Addendum)
Orlandria Yeater (mother)-- [phone] Would like to be called if needed

## 2019-05-01 ENCOUNTER — Encounter: Payer: 59 | Attending: Physical Medicine and Rehabilitation | Admitting: Registered Nurse

## 2019-05-01 ENCOUNTER — Encounter: Payer: Self-pay | Admitting: Registered Nurse

## 2019-05-01 VITALS — BP 112/76 | HR 86 | Temp 97.7°F | Ht 65.0 in | Wt 271.0 lb

## 2019-05-01 DIAGNOSIS — M5126 Other intervertebral disc displacement, lumbar region: Secondary | ICD-10-CM | POA: Insufficient documentation

## 2019-05-01 DIAGNOSIS — M7062 Trochanteric bursitis, left hip: Secondary | ICD-10-CM

## 2019-05-01 DIAGNOSIS — M7061 Trochanteric bursitis, right hip: Secondary | ICD-10-CM | POA: Insufficient documentation

## 2019-05-01 DIAGNOSIS — M48061 Spinal stenosis, lumbar region without neurogenic claudication: Secondary | ICD-10-CM | POA: Diagnosis present

## 2019-05-01 DIAGNOSIS — F5101 Primary insomnia: Secondary | ICD-10-CM | POA: Diagnosis present

## 2019-05-01 DIAGNOSIS — M5416 Radiculopathy, lumbar region: Secondary | ICD-10-CM | POA: Diagnosis not present

## 2019-05-01 DIAGNOSIS — G894 Chronic pain syndrome: Secondary | ICD-10-CM | POA: Diagnosis not present

## 2019-05-01 DIAGNOSIS — Z79891 Long term (current) use of opiate analgesic: Secondary | ICD-10-CM | POA: Diagnosis present

## 2019-05-01 DIAGNOSIS — Z5181 Encounter for therapeutic drug level monitoring: Secondary | ICD-10-CM | POA: Diagnosis not present

## 2019-05-01 DIAGNOSIS — F411 Generalized anxiety disorder: Secondary | ICD-10-CM

## 2019-05-01 DIAGNOSIS — M62830 Muscle spasm of back: Secondary | ICD-10-CM

## 2019-05-01 DIAGNOSIS — G834 Cauda equina syndrome: Secondary | ICD-10-CM | POA: Insufficient documentation

## 2019-05-01 DIAGNOSIS — M778 Other enthesopathies, not elsewhere classified: Secondary | ICD-10-CM

## 2019-05-01 MED ORDER — OXYCODONE-ACETAMINOPHEN 10-325 MG PO TABS: ORAL_TABLET | ORAL | 0 refills | Status: DC

## 2019-05-01 NOTE — Progress Notes (Signed)
Subjective:    Patient ID: [name], female    DOB: 1970-07-15, 49 y.o.   MRN: [id number]  HPI: [name] is a 49 y.o. female who returns for follow up appointment for chronic pain and medication refill. She states her pain is located in her neck ( mainly left side) radiating into her left shoulder and left arm with tingling and lower back pain. She rates her pain 10. Her current exercise regime is walking.   [name] reports she went to [facility] ED on 04/30/2019 for left-sided chest pain, discharge note reviewed.   [name] Morphine equivalent is 75.00  MME.  She is also prescribed Alprazolam. We have discussed the black box warning of using opioids and benzodiazepines. I highlighted the dangers of using these drugs together and discussed the adverse events including respiratory suppression, overdose, cognitive impairment and importance of compliance with current regimen. We will continue to monitor and adjust as indicated.     Last UDS was Performed on 02/09/2019, see notes for details.    Pain Inventory Average Pain 9 Pain Right Now 10 My pain is constant, sharp, burning, dull, stabbing and aching  In the last 24 hours, has pain interfered with the following? General activity 10 Relation with others 10 Enjoyment of life 10 What TIME of day is your pain at its worst? all Sleep (in general) Poor  Pain is worse with: walking, bending, sitting, standing and some activites Pain improves with: rest, heat/ice and medication Relief from Meds: 2  Mobility walk with assistance use a cane  Function not employed: date last employed . I need assistance with the following:  meal prep, household duties and shopping  Neuro/Psych No problems in this area  Prior Studies Any changes since last visit?  no  Physicians involved in your care Any changes since last visit?  no   Family History  Problem Relation Age of Onset  . Hypertension Maternal Grandmother   .  Anesthesia problems Neg Hx    Social History   Socioeconomic History  . Marital status: Divorced    Spouse name: Not on file  . Number of children: Not on file  . Years of education: Not on file  . Highest education level: Not on file  Occupational History  . Not on file  Tobacco Use  . Smoking status: Never Smoker  . Smokeless tobacco: Never Used  Substance and Sexual Activity  . Alcohol use: No  . Drug use: No  . Sexual activity: Not Currently    Birth control/protection: None    Comment: IUD removed 2 wks ago.  Other Topics Concern  . Not on file  Social History Narrative  . Not on file   Social Determinants of Health   Financial Resource Strain:   . Difficulty of Paying Living Expenses: Not on file  Food Insecurity:   . Worried About Charity fundraiser in the Last Year: Not on file  . Ran Out of Food in the Last Year: Not on file  Transportation Needs:   . Lack of Transportation (Medical): Not on file  . Lack of Transportation (Non-Medical): Not on file  Physical Activity:   . Days of Exercise per Week: Not on file  . Minutes of Exercise per Session: Not on file  Stress:   . Feeling of Stress : Not on file  Social Connections:   . Frequency of Communication with Friends and Family: Not on file  . Frequency of Social Gatherings  with Friends and Family: Not on file  . Attends Religious Services: Not on file  . Active Member of Clubs or Organizations: Not on file  . Attends Archivist Meetings: Not on file  . Marital Status: Not on file   Past Surgical History:  Procedure Laterality Date  . ABDOMINAL HYSTERECTOMY  06/29/11   Robotic Assisted Hysterectomy  . CERVICAL SPINE SURGERY  [place]  . LUMBAR LAMINECTOMY  08/31/2011   Procedure: MICRODISCECTOMY LUMBAR LAMINECTOMY;  Surgeon: Tobi Bastos, MD;  Location: [facility];  Service: Orthopedics;  Laterality: N/A;  lumbar five sacral one central microdisectomy  . MYOMECTOMY  13  .  NECK SURGERY    . POSTERIOR CERVICAL FUSION/FORAMINOTOMY N/A 02/02/2013   Procedure: POSTERIOR C5-6 SPINAL FUSION;  Surgeon: Melina Schools, MD;  Location: [place];  Service: Orthopedics;  Laterality: N/A;  . POSTERIOR FUSION CERVICAL SPINE  02/02/2013   C 5 C6     [name]  . WISDOM TOOTH EXTRACTION     Past Medical History:  Diagnosis Date  . Anemia    hx  . Arthritis   . Complication of anesthesia    slow to wake up,   very difficult to get iv had picc line last time  . Difficult intravenous access    hands are usually best for blood draws and IV starts  . DVT (deep venous thrombosis) ([place])    ? vs lupus  . Fibroids   . Headache(784.0)   . Neck pain   . Scoliosis    Ht 5\' 5"  (1.651 m)   Wt 271 lb (122.9 kg)   LMP 04/01/2011   BMI 45.10 kg/m   Opioid Risk Score:   Fall Risk Score:  `1  Depression screen PHQ 2/9  Depression screen [facility] 2/9 08/24/2018 06/29/2018 05/03/2018 03/07/2018 01/03/2018 09/07/2017 06/07/2017  Decreased Interest 0 0 0 0 0 1 1  Down, Depressed, Hopeless 0 0 0 0 0 1 1  PHQ - 2 Score 0 0 0 0 0 2 2  Altered sleeping - - - - - - -  Tired, decreased energy - - - - - - -  Change in appetite - - - - - - -  Feeling bad or failure about yourself  - - - - - - -  Trouble concentrating - - - - - - -  Moving slowly or fidgety/restless - - - - - - -  Suicidal thoughts - - - - - - -  PHQ-9 Score - - - - - - -  Difficult doing work/chores - - - - - - -    Review of Systems  Constitutional: Positive for diaphoresis and unexpected weight change.  HENT: Negative.   Eyes: Negative.   Respiratory: Negative.   Cardiovascular: Positive for leg swelling.  Gastrointestinal: Positive for abdominal pain, constipation, nausea and vomiting.  Endocrine: Negative.   Genitourinary: Negative.   Musculoskeletal: Positive for arthralgias and back pain.  Skin: Negative.   Neurological: Negative.   Psychiatric/Behavioral: Negative.        Objective:   Physical Exam Vitals  and nursing note reviewed.  Constitutional:      Appearance: Normal appearance.  Neck:     Comments: Cervical Paraspinal Tenderness: C-5-C-6 Mainly Left Side Cardiovascular:     Rate and Rhythm: Normal rate and regular rhythm.     Pulses: Normal pulses.     Heart sounds: Normal heart sounds.  Pulmonary:  Effort: Pulmonary effort is normal.     Breath sounds: Normal breath sounds.  Musculoskeletal:     Cervical back: Normal range of motion and neck supple.     Comments: Normal Muscle Bulk and Muscle Testing Reveals:  Upper Extremities: Full ROM and Muscle Strength 5/5 Left AC Joint Tenderness  Thoracic Paraspinal Tenderness: T-1-T-4 Mainly Left Side Lumbar Paraspinal Tenderness: L-3-L-5 Left Greater Trochanter Tenderness Lower Extremities: Full ROM and Muscle Strength 5/5 Arises from Table slowly using cane for support Antalgic Gait   Skin:    General: Skin is warm and dry.  Neurological:     Mental Status: She is alert and oriented to person, place, and time.  Psychiatric:        Mood and Affect: Mood normal.        Behavior: Behavior normal.           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 Status post decompressive laminectomy:Chronic Low Back Pain without Sciatica:Continue to MonitorRefilled:Oxycodone 10mg /325 mg one tablet five times a day as needed#135.05/01/2019 We will continue the opioid monitoring program,this consists of regular clinic visits, examinations, urine drug screen,pill counts as well as use of [place] Controlled Substance Reporting System. 2. Insomnia/ Anxiety: Experiencing Increased Anxiety with Pandemic and current dose of Xanax ineffective. Increased Xanax to 0.5 mg at HS, she verbalizes understanding. 05/01/2019 3. Lumbar Radiculopathy/Neuropathic pain: Continuecurrent medication regimen withGabapentin.05/01/2019. 4. Muscle Spasms. Continuecurrent medication regimen with:  Tizanidine.05/01/2019 5. Neurogenic bowel and bladder.Continue to monitor.05/01/2019 6. Constipation/Opioid Therapy: Continuecurrent medication regimen with Linzess. 05/01/2019 7. Depression: Continuecurrent medication regimen withCelexa.05/01/2019 8.LeftGreater Trochanteric Bursitis:Continue Heat/Ice Therapy.05/01/2019 9. Chronic Pain Syndrome: Continuecurrent medication regimen withVoltaren.05/01/2019 10. Chronic Right Knee Pain:No complaints today. Continue current medication regimenand continue to monitor.05/01/2019 11. Cervicalgia/ Cervical Radiculitis:No complaints today.Continue current medication regimenand HEP as tolerated.. Continue to Monitor.05/01/2019  63minutes of face to face patient care time was spent during this visit. All questions were encouraged and answered.  F/U in 1 month

## 2019-05-29 ENCOUNTER — Encounter: Payer: 59 | Attending: Physical Medicine and Rehabilitation | Admitting: Registered Nurse

## 2019-05-29 ENCOUNTER — Encounter: Payer: Self-pay | Admitting: Registered Nurse

## 2019-05-29 ENCOUNTER — Other Ambulatory Visit: Payer: Self-pay | Admitting: Registered Nurse

## 2019-05-29 ENCOUNTER — Other Ambulatory Visit: Payer: Self-pay

## 2019-05-29 VITALS — HR 88 | Temp 97.7°F | Ht 65.0 in | Wt 279.0 lb

## 2019-05-29 DIAGNOSIS — F5101 Primary insomnia: Secondary | ICD-10-CM | POA: Diagnosis present

## 2019-05-29 DIAGNOSIS — M5126 Other intervertebral disc displacement, lumbar region: Secondary | ICD-10-CM | POA: Insufficient documentation

## 2019-05-29 DIAGNOSIS — M62830 Muscle spasm of back: Secondary | ICD-10-CM

## 2019-05-29 DIAGNOSIS — M48061 Spinal stenosis, lumbar region without neurogenic claudication: Secondary | ICD-10-CM | POA: Diagnosis present

## 2019-05-29 DIAGNOSIS — M7061 Trochanteric bursitis, right hip: Secondary | ICD-10-CM | POA: Diagnosis present

## 2019-05-29 DIAGNOSIS — Z5181 Encounter for therapeutic drug level monitoring: Secondary | ICD-10-CM

## 2019-05-29 DIAGNOSIS — Z79891 Long term (current) use of opiate analgesic: Secondary | ICD-10-CM

## 2019-05-29 DIAGNOSIS — M7062 Trochanteric bursitis, left hip: Secondary | ICD-10-CM

## 2019-05-29 DIAGNOSIS — F411 Generalized anxiety disorder: Secondary | ICD-10-CM

## 2019-05-29 DIAGNOSIS — G834 Cauda equina syndrome: Secondary | ICD-10-CM | POA: Diagnosis present

## 2019-05-29 DIAGNOSIS — M5416 Radiculopathy, lumbar region: Secondary | ICD-10-CM | POA: Diagnosis not present

## 2019-05-29 DIAGNOSIS — G894 Chronic pain syndrome: Secondary | ICD-10-CM

## 2019-05-29 MED ORDER — OXYCODONE-ACETAMINOPHEN 10-325 MG PO TABS: ORAL_TABLET | ORAL | 0 refills | Status: DC

## 2019-05-29 NOTE — Progress Notes (Signed)
Subjective:    Patient ID: [name], female    DOB: Jan 14, 1971, 49 y.o.   MRN: [id number]  HPI: [name] is a 49 y.o. female who returns for follow up appointment for chronic pain and medication refill. She states her pain is located in her lower back radiating into her left hip. She rates her pain 9. Her  current exercise regime is walking and performing stretching exercises.  [name] Morphine equivalent is 75.00  MME.  She is also prescribed Alprazolam . We have discussed the black box warning of using opioids and benzodiazepines. I highlighted the dangers of using these drugs together and discussed the adverse events including respiratory suppression, overdose, cognitive impairment and importance of compliance with current regimen. We will continue to monitor and adjust as indicated.   UDS ordered Today.   Vitals: BP 120/88 P- 88 Sat's 97 %  Pain Inventory Average Pain 9 Pain Right Now 9 My pain is sharp, dull, stabbing, tingling and aching  In the last 24 hours, has pain interfered with the following? General activity 10 Relation with others 10 Enjoyment of life 10 What TIME of day is your pain at its worst? all Sleep (in general) varies  Pain is worse with: walking, bending, sitting, standing and some activites Pain improves with: rest, heat/ice and medication Relief from Meds: 3  Mobility walk with assistance use a cane  Function not employed: date last employed . I need assistance with the following:  meal prep, household duties and shopping  Neuro/Psych No problems in this area  Prior Studies Any changes since last visit?  no  Physicians involved in your care Any changes since last visit?  no   Family History  Problem Relation Age of Onset  . Hypertension Maternal Grandmother   . Anesthesia problems Neg Hx    Social History   Socioeconomic History  . Marital status: Divorced    Spouse name: Not on file  . Number of children: Not on file    . Years of education: Not on file  . Highest education level: Not on file  Occupational History  . Not on file  Tobacco Use  . Smoking status: Never Smoker  . Smokeless tobacco: Never Used  Substance and Sexual Activity  . Alcohol use: No  . Drug use: No  . Sexual activity: Not Currently    Birth control/protection: None    Comment: IUD removed 2 wks ago.  Other Topics Concern  . Not on file  Social History Narrative  . Not on file   Social Determinants of Health   Financial Resource Strain:   . Difficulty of Paying Living Expenses: Not on file  Food Insecurity:   . Worried About Charity fundraiser in the Last Year: Not on file  . Ran Out of Food in the Last Year: Not on file  Transportation Needs:   . Lack of Transportation (Medical): Not on file  . Lack of Transportation (Non-Medical): Not on file  Physical Activity:   . Days of Exercise per Week: Not on file  . Minutes of Exercise per Session: Not on file  Stress:   . Feeling of Stress : Not on file  Social Connections:   . Frequency of Communication with Friends and Family: Not on file  . Frequency of Social Gatherings with Friends and Family: Not on file  . Attends Religious Services: Not on file  . Active Member of Clubs or Organizations: Not on file  .  Attends Archivist Meetings: Not on file  . Marital Status: Not on file   Past Surgical History:  Procedure Laterality Date  . ABDOMINAL HYSTERECTOMY  06/29/11   Robotic Assisted Hysterectomy  . CERVICAL SPINE SURGERY  [place]  . LUMBAR LAMINECTOMY  08/31/2011   Procedure: MICRODISCECTOMY LUMBAR LAMINECTOMY;  Surgeon: Tobi Bastos, MD;  Location: [facility];  Service: Orthopedics;  Laterality: N/A;  lumbar five sacral one central microdisectomy  . MYOMECTOMY  13  . NECK SURGERY    . POSTERIOR CERVICAL FUSION/FORAMINOTOMY N/A 02/02/2013   Procedure: POSTERIOR C5-6 SPINAL FUSION;  Surgeon: Melina Schools, MD;  Location: [place];   Service: Orthopedics;  Laterality: N/A;  . POSTERIOR FUSION CERVICAL SPINE  02/02/2013   C 5 C6     [name]  . WISDOM TOOTH EXTRACTION     Past Medical History:  Diagnosis Date  . Anemia    hx  . Arthritis   . Complication of anesthesia    slow to wake up,   very difficult to get iv had picc line last time  . Difficult intravenous access    hands are usually best for blood draws and IV starts  . DVT (deep venous thrombosis) ([place])    ? vs lupus  . Fibroids   . Headache(784.0)   . Neck pain   . Scoliosis    Pulse 88   Temp 97.7 F (36.5 C)   Ht 5\' 5"  (1.651 m)   Wt 279 lb (126.6 kg)   LMP 04/01/2011   SpO2 97%   BMI 46.43 kg/m   Opioid Risk Score:   Fall Risk Score:  `1  Depression screen PHQ 2/9  Depression screen [facility] 2/9 08/24/2018 06/29/2018 05/03/2018 03/07/2018 01/03/2018 09/07/2017 06/07/2017  Decreased Interest 0 0 0 0 0 1 1  Down, Depressed, Hopeless 0 0 0 0 0 1 1  PHQ - 2 Score 0 0 0 0 0 2 2  Altered sleeping - - - - - - -  Tired, decreased energy - - - - - - -  Change in appetite - - - - - - -  Feeling bad or failure about yourself  - - - - - - -  Trouble concentrating - - - - - - -  Moving slowly or fidgety/restless - - - - - - -  Suicidal thoughts - - - - - - -  PHQ-9 Score - - - - - - -  Difficult doing work/chores - - - - - - -    Review of Systems  Constitutional: Positive for diaphoresis and unexpected weight change.  Eyes: Negative.   Respiratory: Negative.   Cardiovascular: Positive for leg swelling.  Gastrointestinal: Positive for constipation and nausea.  Endocrine: Negative.   Genitourinary: Negative.   Musculoskeletal: Positive for arthralgias and back pain.  Skin: Negative.   Allergic/Immunologic: Negative.   Hematological: Negative.   Psychiatric/Behavioral: Negative.   All other systems reviewed and are negative.      Objective:   Physical Exam Vitals and nursing note reviewed.  Constitutional:      Appearance: Normal  appearance. She is obese.  Cardiovascular:     Rate and Rhythm: Normal rate and regular rhythm.     Pulses: Normal pulses.     Heart sounds: Normal heart sounds.  Pulmonary:     Effort: Pulmonary effort is normal.     Breath sounds: Normal breath sounds.  Musculoskeletal:  Cervical back: Normal range of motion and neck supple.     Comments: Normal Muscle Bulk and Muscle Testing Reveals:  Upper Extremities: Full ROM and Muscle Strength 5/5 Thoracic, Lumbar Lower Extremities Gait   Skin:    General: Skin is warm and dry.  Neurological:     Mental Status: She is alert and oriented to person, place, and time.  Psychiatric:        Mood and Affect: Mood normal.        Behavior: Behavior normal.           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 Status post decompressive laminectomy:Chronic Low Back Pain without Sciatica:Continue to MonitorRefilled:Oxycodone 10mg /325 mg one tablet five times a day as needed#135.05/29/2019 We will continue the opioid monitoring program,this consists of regular clinic visits, examinations, urine drug screen,pill counts as well as use of [place] Controlled Substance Reporting System. 2. Insomnia/ Anxiety:Continue  Xanax to 0.5 mg at HS, educated on the [employer], she verbalizes understanding. 05/29/2019 3. Lumbar Radiculopathy/Neuropathic pain: Continuecurrent medication regimen withGabapentin.05/29/2019. 4. Muscle Spasms. Continuecurrent medication regimen with: Tizanidine.05/29/2019 5. Neurogenic bowel and bladder.Continue to monitor.05/29/2019 6. Constipation/Opioid Therapy: Continuecurrent medication regimen with Linzess. 05/29/2019 7. Depression: Continuecurrent medication regimen withCelexa.05/29/2019 8.LeftGreater Trochanteric Bursitis:Continue Heat/Ice Therapy.05/29/2019 9. Chronic Pain Syndrome: Continuecurrent medication regimen  withVoltaren.05/29/2019 10. Chronic Right Knee Pain:No complaints today.Continue current medication regimenand continue to monitor.05/29/2019 11. Cervicalgia/ Cervical Radiculitis:No complaints today.Continue current medication regimenand HEP as tolerated.. Continue to Monitor.05/29/2019  31minutes of face to face patient care time was spent during this visit. All questions were encouraged and answered.  F/U in 1 month

## 2019-06-02 LAB — TOXASSURE SELECT,+ANTIDEPR,UR

## 2019-06-09 ENCOUNTER — Telehealth: Payer: Self-pay

## 2019-06-09 NOTE — Telephone Encounter (Signed)
UDS RESULTS CONSISTENT WITH MEDICATIONS ON FILE  

## 2019-06-26 ENCOUNTER — Encounter: Payer: Self-pay | Admitting: Registered Nurse

## 2019-06-26 ENCOUNTER — Encounter: Payer: 59 | Attending: Physical Medicine and Rehabilitation | Admitting: Registered Nurse

## 2019-06-26 ENCOUNTER — Other Ambulatory Visit: Payer: Self-pay

## 2019-06-26 VITALS — BP 150/85 | HR 91 | Temp 97.5°F | Ht 65.0 in | Wt 280.0 lb

## 2019-06-26 DIAGNOSIS — M5416 Radiculopathy, lumbar region: Secondary | ICD-10-CM

## 2019-06-26 DIAGNOSIS — M48061 Spinal stenosis, lumbar region without neurogenic claudication: Secondary | ICD-10-CM | POA: Diagnosis present

## 2019-06-26 DIAGNOSIS — F5101 Primary insomnia: Secondary | ICD-10-CM

## 2019-06-26 DIAGNOSIS — M7061 Trochanteric bursitis, right hip: Secondary | ICD-10-CM

## 2019-06-26 DIAGNOSIS — M5126 Other intervertebral disc displacement, lumbar region: Secondary | ICD-10-CM | POA: Diagnosis present

## 2019-06-26 DIAGNOSIS — M7062 Trochanteric bursitis, left hip: Secondary | ICD-10-CM

## 2019-06-26 DIAGNOSIS — G894 Chronic pain syndrome: Secondary | ICD-10-CM | POA: Diagnosis present

## 2019-06-26 DIAGNOSIS — M62830 Muscle spasm of back: Secondary | ICD-10-CM

## 2019-06-26 DIAGNOSIS — Z79891 Long term (current) use of opiate analgesic: Secondary | ICD-10-CM

## 2019-06-26 DIAGNOSIS — Z5181 Encounter for therapeutic drug level monitoring: Secondary | ICD-10-CM | POA: Diagnosis present

## 2019-06-26 DIAGNOSIS — G834 Cauda equina syndrome: Secondary | ICD-10-CM | POA: Diagnosis present

## 2019-06-26 MED ORDER — GABAPENTIN 300 MG PO CAPS: ORAL_CAPSULE | ORAL | 3 refills | Status: DC

## 2019-06-26 MED ORDER — TIZANIDINE HCL 4 MG PO TABS: ORAL_TABLET | ORAL | 3 refills | Status: DC

## 2019-06-26 MED ORDER — DICLOFENAC SODIUM 75 MG PO TBEC: 75.0000 mg | DELAYED_RELEASE_TABLET | Freq: Two times a day (BID) | ORAL | 3 refills | Status: DC

## 2019-06-26 MED ORDER — ALPRAZOLAM 0.5 MG PO TABS: 0.5000 mg | ORAL_TABLET | Freq: Every day | ORAL | 2 refills | Status: DC | PRN

## 2019-06-26 MED ORDER — OXYCODONE-ACETAMINOPHEN 10-325 MG PO TABS: ORAL_TABLET | ORAL | 0 refills | Status: DC

## 2019-06-26 NOTE — Progress Notes (Signed)
Subjective:    Patient ID: [name], female    DOB: [birth year], 49 y.o.   MRN: [id number]  HPI: [name] is a 49 y.o. female who returns for follow up appointment for chronic pain and medication refill. She states her pain is located in her lower back radiating into her left hip. She rates her pain 8. Her current exercise regime is walking and performing stretching exercises.  [name] Morphine equivalent is 67.50 MME.She is also prescribed Alprazolam. We have discussed the black box warning of using opioids and benzodiazepines. I highlighted the dangers of using these drugs together and discussed the adverse events including respiratory suppression, overdose, cognitive impairment and importance of compliance with current regimen. We will continue to monitor and adjust as indicated.   Last UDS was Performed on 05/29/2019, it was consistent.     Pain Inventory Average Pain 8 Pain Right Now 8 My pain is stabbing, tingling and aching  In the last 24 hours, has pain interfered with the following? General activity 9 Relation with others 9 Enjoyment of life 9 What TIME of day is your pain at its worst? all Sleep (in general) Poor  Pain is worse with: walking, bending, sitting, standing and some activites Pain improves with: heat/ice, therapy/exercise and medication Relief from Meds: 2  Mobility use a cane ability to climb steps?  yes do you drive?  no  Function not employed: date last employed . I need assistance with the following:  household duties and shopping  Neuro/Psych No problems in this area  Prior Studies Any changes since last visit?  no  Physicians involved in your care Any changes since last visit?  no   Family History  Problem Relation Age of Onset  . Hypertension Maternal Grandmother   . Anesthesia problems Neg Hx    Social History   Socioeconomic History  . Marital status: Divorced    Spouse name: Not on file  . Number of children: Not  on file  . Years of education: Not on file  . Highest education level: Not on file  Occupational History  . Not on file  Tobacco Use  . Smoking status: Never Smoker  . Smokeless tobacco: Never Used  Substance and Sexual Activity  . Alcohol use: No  . Drug use: No  . Sexual activity: Not Currently    Birth control/protection: None    Comment: IUD removed 2 wks ago.  Other Topics Concern  . Not on file  Social History Narrative  . Not on file   Social Determinants of Health   Financial Resource Strain:   . Difficulty of Paying Living Expenses:   Food Insecurity:   . Worried About Charity fundraiser in the Last Year:   . Arboriculturist in the Last Year:   Transportation Needs:   . Film/video editor (Medical):   Marland Kitchen Lack of Transportation (Non-Medical):   Physical Activity:   . Days of Exercise per Week:   . Minutes of Exercise per Session:   Stress:   . Feeling of Stress :   Social Connections:   . Frequency of Communication with Friends and Family:   . Frequency of Social Gatherings with Friends and Family:   . Attends Religious Services:   . Active Member of Clubs or Organizations:   . Attends Archivist Meetings:   Marland Kitchen Marital Status:    Past Surgical History:  Procedure Laterality Date  . ABDOMINAL HYSTERECTOMY  06/29/11  Robotic Assisted Hysterectomy  . CERVICAL SPINE SURGERY  [place]  . LUMBAR LAMINECTOMY  08/31/2011   Procedure: MICRODISCECTOMY LUMBAR LAMINECTOMY;  Surgeon: Tobi Bastos, MD;  Location: [facility];  Service: Orthopedics;  Laterality: N/A;  lumbar five sacral one central microdisectomy  . MYOMECTOMY  13  . NECK SURGERY    . POSTERIOR CERVICAL FUSION/FORAMINOTOMY N/A 02/02/2013   Procedure: POSTERIOR C5-6 SPINAL FUSION;  Surgeon: Melina Schools, MD;  Location: [place];  Service: Orthopedics;  Laterality: N/A;  . POSTERIOR FUSION CERVICAL SPINE  02/02/2013   C 5 C6     [name]  . WISDOM TOOTH EXTRACTION     Past  Medical History:  Diagnosis Date  . Anemia    hx  . Arthritis   . Complication of anesthesia    slow to wake up,   very difficult to get iv had picc line last time  . Difficult intravenous access    hands are usually best for blood draws and IV starts  . DVT (deep venous thrombosis) ([place])    ? vs lupus  . Fibroids   . Headache(784.0)   . Neck pain   . Scoliosis    BP (!) 150/85   Pulse 91   Temp (!) 97.5 F (36.4 C)   Ht 5\' 5"  (1.651 m)   Wt 280 lb (127 kg)   LMP 04/01/2011   SpO2 97%   BMI 46.59 kg/m   Opioid Risk Score:   Fall Risk Score:  `1  Depression screen PHQ 2/9  Depression screen [facility] 2/9 08/24/2018 06/29/2018 05/03/2018 03/07/2018 01/03/2018 09/07/2017 06/07/2017  Decreased Interest 0 0 0 0 0 1 1  Down, Depressed, Hopeless 0 0 0 0 0 1 1  PHQ - 2 Score 0 0 0 0 0 2 2  Altered sleeping - - - - - - -  Tired, decreased energy - - - - - - -  Change in appetite - - - - - - -  Feeling bad or failure about yourself  - - - - - - -  Trouble concentrating - - - - - - -  Moving slowly or fidgety/restless - - - - - - -  Suicidal thoughts - - - - - - -  PHQ-9 Score - - - - - - -  Difficult doing work/chores - - - - - - -     Review of Systems     Objective:   Physical Exam Vitals and nursing note reviewed.  Constitutional:      Appearance: Normal appearance.  Cardiovascular:     Rate and Rhythm: Normal rate and regular rhythm.     Pulses: Normal pulses.     Heart sounds: Normal heart sounds.  Pulmonary:     Effort: Pulmonary effort is normal.     Breath sounds: Normal breath sounds.  Musculoskeletal:     Cervical back: Normal range of motion and neck supple.     Comments: Normal Muscle Bulk and Muscle Testing Reveals:  Upper Extremities: Full ROM and Muscle Strength 5/5 Lumbar Hypersensitivity Left Greater Trochanter Tenderness Lower Extremities: Full ROM and Muscle Strength 5/5 Arises from Table with ease Narrow Based Gait   Skin:    General: Skin is warm  and dry.  Neurological:     Mental Status: She is alert and oriented to person, place, and time.  Psychiatric:        Mood and Affect: Mood normal.  Behavior: Behavior normal.           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 Status post decompressive laminectomy:Chronic Low Back Pain without Sciatica:Continue to MonitorRefilled:Oxycodone 10mg /325 mg one tablet five times a day as needed#135.06/26/2019 We will continue the opioid monitoring program,this consists of regular clinic visits, examinations, urine drug screen,pill counts as well as use of [place] Controlled Substance Reporting System. 2. Insomnia/ Anxiety:Continue  Xanax to 0.5 mg at HS, educated on the [employer], she verbalizes understanding. 06/26/2019 3. Lumbar Radiculopathy/Neuropathic pain: Continuecurrent medication regimen withGabapentin.06/26/2019. 4. Muscle Spasms. Continuecurrent medication regimen with: Tizanidine.06/26/2019 5. Neurogenic bowel and bladder.Continue to monitor.06/26/2019 6. Constipation/Opioid Therapy: Continuecurrent medication regimen with Linzess. 06/26/2019 7. Depression: Continuecurrent medication regimen withCelexa.06/26/2019 8.LeftGreater Trochanteric Bursitis:Continue Heat/Ice Therapy.06/26/2019 9. Chronic Pain Syndrome: Continuecurrent medication regimen withVoltaren.06/26/2019 10. Chronic Right Knee Pain:No complaints today.Continue current medication regimenand continue to monitor.06/26/2019 11. Cervicalgia/ Cervical Radiculitis:No complaints today.Continue current medication regimenand HEP as tolerated.. Continue to Monitor.06/26/2019  69minutes of face to face patient care time was spent during this visit. All questions were encouraged and answered.  F/U in 1 month

## 2019-07-25 ENCOUNTER — Encounter: Payer: 59 | Attending: Physical Medicine and Rehabilitation | Admitting: Registered Nurse

## 2019-07-25 ENCOUNTER — Encounter: Payer: Self-pay | Admitting: Registered Nurse

## 2019-07-25 ENCOUNTER — Other Ambulatory Visit: Payer: Self-pay

## 2019-07-25 VITALS — BP 134/85 | HR 106 | Temp 97.5°F | Ht 65.0 in | Wt 274.0 lb

## 2019-07-25 DIAGNOSIS — M7062 Trochanteric bursitis, left hip: Secondary | ICD-10-CM | POA: Diagnosis not present

## 2019-07-25 DIAGNOSIS — G834 Cauda equina syndrome: Secondary | ICD-10-CM

## 2019-07-25 DIAGNOSIS — M48061 Spinal stenosis, lumbar region without neurogenic claudication: Secondary | ICD-10-CM | POA: Insufficient documentation

## 2019-07-25 DIAGNOSIS — M545 Low back pain, unspecified: Secondary | ICD-10-CM

## 2019-07-25 DIAGNOSIS — Z79891 Long term (current) use of opiate analgesic: Secondary | ICD-10-CM

## 2019-07-25 DIAGNOSIS — F5101 Primary insomnia: Secondary | ICD-10-CM | POA: Diagnosis present

## 2019-07-25 DIAGNOSIS — M5126 Other intervertebral disc displacement, lumbar region: Secondary | ICD-10-CM | POA: Diagnosis present

## 2019-07-25 DIAGNOSIS — G894 Chronic pain syndrome: Secondary | ICD-10-CM

## 2019-07-25 DIAGNOSIS — Z5181 Encounter for therapeutic drug level monitoring: Secondary | ICD-10-CM | POA: Diagnosis present

## 2019-07-25 DIAGNOSIS — F411 Generalized anxiety disorder: Secondary | ICD-10-CM

## 2019-07-25 DIAGNOSIS — M7061 Trochanteric bursitis, right hip: Secondary | ICD-10-CM | POA: Diagnosis present

## 2019-07-25 DIAGNOSIS — G8929 Other chronic pain: Secondary | ICD-10-CM

## 2019-07-25 MED ORDER — ALPRAZOLAM 0.5 MG PO TABS: 0.5000 mg | ORAL_TABLET | Freq: Every day | ORAL | 2 refills | Status: DC | PRN

## 2019-07-25 MED ORDER — OXYCODONE-ACETAMINOPHEN 10-325 MG PO TABS: ORAL_TABLET | ORAL | 0 refills | Status: DC

## 2019-07-25 MED ORDER — GABAPENTIN 300 MG PO CAPS: ORAL_CAPSULE | ORAL | 3 refills | Status: DC

## 2019-07-25 NOTE — Progress Notes (Signed)
Subjective:    Patient ID: [name], female    DOB: Apr 13, 1970, 49 y.o.   MRN: [id number]  HPI: [name] is a 49 y.o. female who returns for follow up appointment for chronic pain and medication refill. She states her pain is located in her lower back  And left hip. She rates her pain 8. Her current exercise regime is walking.  [name] Morphine equivalent is 75.00 MME. She s also prescribed Alprazolam. We have discussed the black box warning of using opioids and benzodiazepines. I highlighted the dangers of using these drugs together and discussed the adverse events including respiratory suppression, overdose, cognitive impairment and importance of compliance with current regimen. We will continue to monitor and adjust as indicated.   Last UDS was Performed on 05/29/2019, it was consistent.    Pain Inventory Average Pain 9 Pain Right Now 8 My pain is dull, stabbing, tingling and aching  In the last 24 hours, has pain interfered with the following? General activity 9 Relation with others 9 Enjoyment of life 9 What TIME of day is your pain at its worst? all Sleep (in general) Poor  Pain is worse with: walking, bending, sitting, standing and some activites Pain improves with: rest, heat/ice and medication Relief from Meds: 4  Mobility walk with assistance use a cane ability to climb steps?  no do you drive?  no  Function not employed: date last employed . I need assistance with the following:  household duties and shopping  Neuro/Psych numbness trouble walking spasms  Prior Studies Any changes since last visit?  no  Physicians involved in your care Any changes since last visit?  no   Family History  Problem Relation Age of Onset  . Hypertension Maternal Grandmother   . Anesthesia problems Neg Hx    Social History   Socioeconomic History  . Marital status: Divorced    Spouse name: Not on file  . Number of children: Not on file  . Years of  education: Not on file  . Highest education level: Not on file  Occupational History  . Not on file  Tobacco Use  . Smoking status: Never Smoker  . Smokeless tobacco: Never Used  Substance and Sexual Activity  . Alcohol use: No  . Drug use: No  . Sexual activity: Not Currently    Birth control/protection: None    Comment: IUD removed 2 wks ago.  Other Topics Concern  . Not on file  Social History Narrative  . Not on file   Social Determinants of Health   Financial Resource Strain:   . Difficulty of Paying Living Expenses:   Food Insecurity:   . Worried About Charity fundraiser in the Last Year:   . Arboriculturist in the Last Year:   Transportation Needs:   . Film/video editor (Medical):   Marland Kitchen Lack of Transportation (Non-Medical):   Physical Activity:   . Days of Exercise per Week:   . Minutes of Exercise per Session:   Stress:   . Feeling of Stress :   Social Connections:   . Frequency of Communication with Friends and Family:   . Frequency of Social Gatherings with Friends and Family:   . Attends Religious Services:   . Active Member of Clubs or Organizations:   . Attends Archivist Meetings:   Marland Kitchen Marital Status:    Past Surgical History:  Procedure Laterality Date  . ABDOMINAL HYSTERECTOMY  06/29/11   Robotic  Assisted Hysterectomy  . CERVICAL SPINE SURGERY  [place]  . LUMBAR LAMINECTOMY  08/31/2011   Procedure: MICRODISCECTOMY LUMBAR LAMINECTOMY;  Surgeon: Tobi Bastos, MD;  [facility];  Service: Orthopedics;  Laterality: N/A;  lumbar five sacral one central microdisectomy  . MYOMECTOMY  13  . NECK SURGERY    . POSTERIOR CERVICAL FUSION/FORAMINOTOMY N/A 02/02/2013   Procedure: POSTERIOR C5-6 SPINAL FUSION;  Surgeon: Melina Schools, MD;  Location: [place];  Service: Orthopedics;  Laterality: N/A;  . POSTERIOR FUSION CERVICAL SPINE  02/02/2013   C 5 C6     [name]  . WISDOM TOOTH EXTRACTION     Past Medical History:    Diagnosis Date  . Anemia    hx  . Arthritis   . Complication of anesthesia    slow to wake up,   very difficult to get iv had picc line last time  . Difficult intravenous access    hands are usually best for blood draws and IV starts  . DVT (deep venous thrombosis) ([place])    ? vs lupus  . Fibroids   . Headache(784.0)   . Neck pain   . Scoliosis    BP 134/85   Pulse (!) 106   Temp (!) 97.5 F (36.4 C)   Ht 5\' 5"  (1.651 m)   Wt 274 lb (124.3 kg)   LMP 04/01/2011   SpO2 98%   BMI 45.60 kg/m   Opioid Risk Score:   Fall Risk Score:  `1  Depression screen PHQ 2/9  Depression screen [facility] 2/9 08/24/2018 06/29/2018 05/03/2018 03/07/2018 01/03/2018 09/07/2017 06/07/2017  Decreased Interest 0 0 0 0 0 1 1  Down, Depressed, Hopeless 0 0 0 0 0 1 1  PHQ - 2 Score 0 0 0 0 0 2 2  Altered sleeping - - - - - - -  Tired, decreased energy - - - - - - -  Change in appetite - - - - - - -  Feeling bad or failure about yourself  - - - - - - -  Trouble concentrating - - - - - - -  Moving slowly or fidgety/restless - - - - - - -  Suicidal thoughts - - - - - - -  PHQ-9 Score - - - - - - -  Difficult doing work/chores - - - - - - -    Review of Systems  Constitutional: Positive for diaphoresis and unexpected weight change.  HENT: Negative.   Eyes: Negative.   Respiratory: Negative.   Cardiovascular: Positive for leg swelling.  Gastrointestinal: Positive for abdominal pain, constipation and nausea.  Endocrine: Negative.   Genitourinary: Negative.   Musculoskeletal: Positive for arthralgias, back pain and gait problem.       Spasms   Skin: Negative.   Allergic/Immunologic: Negative.   Neurological: Positive for numbness.  Hematological: Negative.   Psychiatric/Behavioral: Negative.   All other systems reviewed and are negative.      Objective:   Physical Exam Vitals and nursing note reviewed.  Constitutional:      Appearance: Normal appearance. She is obese.  Cardiovascular:      Rate and Rhythm: Normal rate and regular rhythm.     Pulses: Normal pulses.     Heart sounds: Normal heart sounds.  Pulmonary:     Effort: Pulmonary effort is normal.     Breath sounds: Normal breath sounds.  Musculoskeletal:     Cervical back: Normal  range of motion and neck supple.     Right lower leg: Edema present.     Left lower leg: Edema present.     Comments: Normal Muscle Bulk and Muscle Testing Reveals:  Upper Extremities: Full ROM and Muscle Strength 5/5  Lumbar Paraspinal Tenderness: L-3-L-5 Left Greater Trochanteric Tenderness Lower Extremities: Full ROM and Muscle Strength 5/5 Arises from Table slowly using cane for support Narrow Based Gait   Skin:    General: Skin is warm and dry.  Neurological:     Mental Status: She is alert and oriented to person, place, and time.  Psychiatric:        Mood and Affect: Mood normal.        Behavior: Behavior normal.           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 Status post decompressive laminectomy:Chronic Low Back Pain without Sciatica:Continue to MonitorRefilled:Oxycodone 10mg /325 mg one tablet five times a day as needed#135.07/25/2019 We will continue the opioid monitoring program,this consists of regular clinic visits, examinations, urine drug screen,pill counts as well as use of [place] Controlled Substance Reporting System. 2. Insomnia/ Anxiety:ContinueXanax to 0.5 mg at HS, educated on the [employer], she verbalizes understanding. 07/25/2019 3. Lumbar Radiculopathy/Neuropathic pain: Continuecurrent medication regimen withGabapentin.07/25/2019. 4. Muscle Spasms. Continuecurrent medication regimen with: Tizanidine.06/26/2019 5. Neurogenic bowel and bladder.Continue to monitor.07/25/2019 6. Constipation/Opioid Therapy: Continuecurrent medication regimen with Linzess. 07/25/2019 7. Depression: Continuecurrent medication regimen  withCelexa.07/25/2019 8.LeftGreater Trochanteric Bursitis:Continue Heat/Ice Therapy.07/25/2019 9. Chronic Pain Syndrome: Continuecurrent medication regimen withVoltaren.07/25/2019 10. Chronic Right Knee Pain:No complaints today.Continue current medication regimenand continue to monitor.07/25/2019 11. Cervicalgia/ Cervical Radiculitis:No complaints today.Continue current medication regimenand HEP as tolerated.. Continue to Monitor.07/25/2019  79minutes of face to face patient care time was spent during this visit. All questions were encouraged and answered.  F/U in 1 month

## 2019-07-28 ENCOUNTER — Ambulatory Visit: Payer: 59 | Admitting: Registered Nurse

## 2019-08-23 ENCOUNTER — Encounter: Payer: 59 | Attending: Physical Medicine and Rehabilitation | Admitting: Registered Nurse

## 2019-08-23 ENCOUNTER — Other Ambulatory Visit: Payer: Self-pay

## 2019-08-23 VITALS — BP 132/89 | HR 105 | Temp 98.3°F | Ht 65.0 in | Wt 268.6 lb

## 2019-08-23 DIAGNOSIS — M48061 Spinal stenosis, lumbar region without neurogenic claudication: Secondary | ICD-10-CM | POA: Insufficient documentation

## 2019-08-23 DIAGNOSIS — M5126 Other intervertebral disc displacement, lumbar region: Secondary | ICD-10-CM | POA: Diagnosis present

## 2019-08-23 DIAGNOSIS — Z5181 Encounter for therapeutic drug level monitoring: Secondary | ICD-10-CM | POA: Diagnosis present

## 2019-08-23 DIAGNOSIS — M7061 Trochanteric bursitis, right hip: Secondary | ICD-10-CM | POA: Diagnosis not present

## 2019-08-23 DIAGNOSIS — Z79891 Long term (current) use of opiate analgesic: Secondary | ICD-10-CM | POA: Diagnosis present

## 2019-08-23 DIAGNOSIS — M5416 Radiculopathy, lumbar region: Secondary | ICD-10-CM | POA: Diagnosis not present

## 2019-08-23 DIAGNOSIS — G894 Chronic pain syndrome: Secondary | ICD-10-CM

## 2019-08-23 DIAGNOSIS — M7062 Trochanteric bursitis, left hip: Secondary | ICD-10-CM

## 2019-08-23 DIAGNOSIS — G8929 Other chronic pain: Secondary | ICD-10-CM

## 2019-08-23 DIAGNOSIS — G834 Cauda equina syndrome: Secondary | ICD-10-CM

## 2019-08-23 DIAGNOSIS — M62830 Muscle spasm of back: Secondary | ICD-10-CM

## 2019-08-23 DIAGNOSIS — M25561 Pain in right knee: Secondary | ICD-10-CM

## 2019-08-23 DIAGNOSIS — F5101 Primary insomnia: Secondary | ICD-10-CM

## 2019-08-23 DIAGNOSIS — F411 Generalized anxiety disorder: Secondary | ICD-10-CM

## 2019-08-23 MED ORDER — OXYCODONE-ACETAMINOPHEN 10-325 MG PO TABS: ORAL_TABLET | ORAL | 0 refills | Status: DC

## 2019-08-23 NOTE — Progress Notes (Signed)
Subjective:    Patient ID: [name], female    DOB: 1970-09-25, 49 y.o.   MRN: [id number]  HPI: [name] is a 49 y.o. female who returns for follow up appointment for chronic pain and medication refill. She states her pain is located in her lower back radiating into her left hip and right knee pain. She rates her pain 9. Her  current exercise regime is walking, pool therapy at [facility] weekly and performing stretching exercises.  [name] Morphine equivalent is 75.00  MME.  She is also prescribed Alprazolam. We have discussed the black box warning of using opioids and benzodiazepines. I highlighted the dangers of using these drugs together and discussed the adverse events including respiratory suppression, overdose, cognitive impairment and importance of compliance with current regimen. We will continue to monitor and adjust as indicated.   Last UDS was Performed on 05/29/2019, it was consistent.   Pain Inventory Average Pain 9 Pain Right Now 9 My pain is dull, stabbing, tingling and aching  In the last 24 hours, has pain interfered with the following? General activity 9 Relation with others 9 Enjoyment of life 9 What TIME of day is your pain at its worst? all Sleep (in general) Poor  Pain is worse with: walking, bending, sitting, standing and some activites Pain improves with: rest, heat/ice and medication Relief from Meds: 4  Mobility use a cane ability to climb steps?  no do you drive?  no  Function not employed: date last employed . I need assistance with the following:  household duties and shopping  Neuro/Psych numbness trouble walking spasms  Prior Studies Any changes since last visit?  no  Physicians involved in your care Any changes since last visit?  no   Family History  Problem Relation Age of Onset  . Hypertension Maternal Grandmother   . Anesthesia problems Neg Hx    Social History   Socioeconomic History  . Marital status:  Divorced    Spouse name: Not on file  . Number of children: Not on file  . Years of education: Not on file  . Highest education level: Not on file  Occupational History  . Not on file  Tobacco Use  . Smoking status: Never Smoker  . Smokeless tobacco: Never Used  Substance and Sexual Activity  . Alcohol use: No  . Drug use: No  . Sexual activity: Not Currently    Birth control/protection: None    Comment: IUD removed 2 wks ago.  Other Topics Concern  . Not on file  Social History Narrative  . Not on file   Social Determinants of Health   Financial Resource Strain:   . Difficulty of Paying Living Expenses:   Food Insecurity:   . Worried About Charity fundraiser in the Last Year:   . Arboriculturist in the Last Year:   Transportation Needs:   . Film/video editor (Medical):   Marland Kitchen Lack of Transportation (Non-Medical):   Physical Activity:   . Days of Exercise per Week:   . Minutes of Exercise per Session:   Stress:   . Feeling of Stress :   Social Connections:   . Frequency of Communication with Friends and Family:   . Frequency of Social Gatherings with Friends and Family:   . Attends Religious Services:   . Active Member of Clubs or Organizations:   . Attends Archivist Meetings:   Marland Kitchen Marital Status:    Past  Surgical History:  Procedure Laterality Date  . ABDOMINAL HYSTERECTOMY  06/29/11   Robotic Assisted Hysterectomy  . CERVICAL SPINE SURGERY  [place]  . LUMBAR LAMINECTOMY  08/31/2011   Procedure: MICRODISCECTOMY LUMBAR LAMINECTOMY;  Surgeon: Brooke Bastos, MD;  [facility];  Service: Orthopedics;  Laterality: N/A;  lumbar five sacral one central microdisectomy  . MYOMECTOMY  13  . NECK SURGERY    . POSTERIOR CERVICAL FUSION/FORAMINOTOMY N/A 02/02/2013   Procedure: POSTERIOR C5-6 SPINAL FUSION;  Surgeon: Brooke Schools, MD;  Location: [place];  Service: Orthopedics;  Laterality: N/A;  . POSTERIOR FUSION CERVICAL SPINE   02/02/2013   C 5 C6     [name]  . WISDOM TOOTH EXTRACTION     Past Medical History:  Diagnosis Date  . Anemia    hx  . Arthritis   . Complication of anesthesia    slow to wake up,   very difficult to get iv had picc line last time  . Difficult intravenous access    hands are usually best for blood draws and IV starts  . DVT (deep venous thrombosis) ([place])    ? vs lupus  . Fibroids   . Headache(784.0)   . Neck pain   . Scoliosis    LMP 04/01/2011   Opioid Risk Score:   Fall Risk Score:  `1  Depression screen PHQ 2/9  Depression screen [facility] 2/9 08/24/2018 06/29/2018 05/03/2018 03/07/2018 01/03/2018 09/07/2017 06/07/2017  Decreased Interest 0 0 0 0 0 1 1  Down, Depressed, Hopeless 0 0 0 0 0 1 1  PHQ - 2 Score 0 0 0 0 0 2 2  Altered sleeping - - - - - - -  Tired, decreased energy - - - - - - -  Change in appetite - - - - - - -  Feeling bad or failure about yourself  - - - - - - -  Trouble concentrating - - - - - - -  Moving slowly or fidgety/restless - - - - - - -  Suicidal thoughts - - - - - - -  PHQ-9 Score - - - - - - -  Difficult doing work/chores - - - - - - -     Review of Systems  Musculoskeletal: Positive for gait problem.  Neurological: Positive for numbness.       Spasms  All other systems reviewed and are negative.      Objective:   Physical Exam Vitals and nursing note reviewed.  Constitutional:      Appearance: Normal appearance.  Cardiovascular:     Rate and Rhythm: Normal rate and regular rhythm.     Pulses: Normal pulses.     Heart sounds: Normal heart sounds.  Pulmonary:     Effort: Pulmonary effort is normal.     Breath sounds: Normal breath sounds.  Musculoskeletal:     Cervical back: Normal range of motion and neck supple.     Comments: Normal Muscle Bulk and Muscle Testing Reveals:  Upper Extremities: Full ROM and Muscle Strength 5/5 Lumbar Hypersensitivity Left Greater Trochanteric Tenderness Lower Extremities: Full ROM and Muscle  Strength 5/5 Arises from Table Slowly using cane for support Antalgic  Gait   Skin:    General: Skin is warm and dry.  Neurological:     Mental Status: She is alert and oriented to person, place, and time.  Psychiatric:        Mood and  Affect: Mood normal.        Behavior: Behavior normal.           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 Status post decompressive laminectomy:Chronic Low Back Pain without Sciatica:Continue to MonitorRefilled:Oxycodone 10mg /325 mg one tablet five times a day as needed#135.08/23/2019 We will continue the opioid monitoring program,this consists of regular clinic visits, examinations, urine drug screen,pill counts as well as use of [place] Controlled Substance Reporting System. 2. Insomnia/ Anxiety:ContinueXanax to 0.5 mg at HS, educated on the [employer], she verbalizes understanding. 08/23/2019 3. Lumbar Radiculopathy/Neuropathic pain: Continuecurrent medication regimen withGabapentin.08/23/2019. 4. Muscle Spasms. Continuecurrent medication regimen with: Tizanidine.08/23/2019 5. Neurogenic bowel and bladder.Continue to monitor.08/23/2019 6. Constipation/Opioid Therapy: Continuecurrent medication regimen with Linzess. 08/23/2019 7. Depression: Continuecurrent medication regimen withCelexa.08/23/2019 8.LeftGreater Trochanteric Bursitis:Continue Heat/Ice Therapy.08/23/2019 9. Chronic Pain Syndrome: Continuecurrent medication regimen withVoltaren.08/23/2019 10. Chronic Right Knee Pain:Continue current medication regimenand continue to monitor.08/23/2019 11. Cervicalgia/ Cervical Radiculitis:No complaints today.Continue current medication regimenand HEP as tolerated.. Continue to Monitor.08/23/2019  58minutes of face to face patient care time was spent during this visit. All questions were encouraged and answered.  F/U in 1 month

## 2019-08-25 ENCOUNTER — Encounter: Payer: Self-pay | Admitting: Registered Nurse

## 2019-09-26 ENCOUNTER — Encounter: Payer: Self-pay | Admitting: Registered Nurse

## 2019-09-26 ENCOUNTER — Encounter: Payer: 59 | Attending: Physical Medicine and Rehabilitation | Admitting: Registered Nurse

## 2019-09-26 ENCOUNTER — Other Ambulatory Visit: Payer: Self-pay

## 2019-09-26 VITALS — BP 123/79 | HR 102 | Temp 98.6°F | Ht 65.0 in | Wt 264.0 lb

## 2019-09-26 DIAGNOSIS — M5126 Other intervertebral disc displacement, lumbar region: Secondary | ICD-10-CM | POA: Insufficient documentation

## 2019-09-26 DIAGNOSIS — G834 Cauda equina syndrome: Secondary | ICD-10-CM

## 2019-09-26 DIAGNOSIS — G894 Chronic pain syndrome: Secondary | ICD-10-CM | POA: Diagnosis present

## 2019-09-26 DIAGNOSIS — M7061 Trochanteric bursitis, right hip: Secondary | ICD-10-CM | POA: Diagnosis present

## 2019-09-26 DIAGNOSIS — F411 Generalized anxiety disorder: Secondary | ICD-10-CM

## 2019-09-26 DIAGNOSIS — Z79891 Long term (current) use of opiate analgesic: Secondary | ICD-10-CM | POA: Insufficient documentation

## 2019-09-26 DIAGNOSIS — M62830 Muscle spasm of back: Secondary | ICD-10-CM

## 2019-09-26 DIAGNOSIS — F5101 Primary insomnia: Secondary | ICD-10-CM | POA: Insufficient documentation

## 2019-09-26 DIAGNOSIS — M48061 Spinal stenosis, lumbar region without neurogenic claudication: Secondary | ICD-10-CM | POA: Diagnosis present

## 2019-09-26 DIAGNOSIS — Z5181 Encounter for therapeutic drug level monitoring: Secondary | ICD-10-CM | POA: Diagnosis present

## 2019-09-26 DIAGNOSIS — M7062 Trochanteric bursitis, left hip: Secondary | ICD-10-CM | POA: Insufficient documentation

## 2019-09-26 DIAGNOSIS — M5416 Radiculopathy, lumbar region: Secondary | ICD-10-CM | POA: Diagnosis not present

## 2019-09-26 MED ORDER — ALPRAZOLAM 0.5 MG PO TABS: 0.5000 mg | ORAL_TABLET | Freq: Every day | ORAL | 2 refills | Status: DC | PRN

## 2019-09-26 MED ORDER — OXYCODONE-ACETAMINOPHEN 10-325 MG PO TABS: ORAL_TABLET | ORAL | 0 refills | Status: DC

## 2019-09-26 MED ORDER — GABAPENTIN 300 MG PO CAPS: ORAL_CAPSULE | ORAL | 3 refills | Status: DC

## 2019-09-26 MED ORDER — DICLOFENAC SODIUM 75 MG PO TBEC: 75.0000 mg | DELAYED_RELEASE_TABLET | Freq: Two times a day (BID) | ORAL | 3 refills | Status: DC

## 2019-09-26 MED ORDER — CITALOPRAM HYDROBROMIDE 10 MG PO TABS: 10.0000 mg | ORAL_TABLET | Freq: Every day | ORAL | 3 refills | Status: DC

## 2019-09-26 MED ORDER — TIZANIDINE HCL 4 MG PO TABS: ORAL_TABLET | ORAL | 3 refills | Status: DC

## 2019-09-26 NOTE — Progress Notes (Signed)
Subjective:    Patient ID: [name], female    DOB: 12/08/1970, 49 y.o.   MRN: [id number]  HPI: [name] is a 49 y.o. female who returns for follow up appointment for chronic pain and medication refill. She states her pain is located in her lower back radiating into her left hip. She  rates her pain 9. Her  current exercise regime is walking, swimming three days a week  and performing stretching exercises. [name] Morphine equivalent is 67.50 MME.  She  is also prescribed Alprazolam. We have discussed the black box warning of using opioids and benzodiazepines. I highlighted the dangers of using these drugs together and discussed the adverse events including respiratory suppression, overdose, cognitive impairment and importance of compliance with current regimen. We will continue to monitor and adjust as indicated.   . Last UDS was Performed on 05/29/2019, it was consistent.    Pain Inventory Average Pain 9 Pain Right Now 9 My pain is sharp, dull, stabbing and aching  In the last 24 hours, has pain interfered with the following? General activity 9 Relation with others 9 Enjoyment of life 9 What TIME of day is your pain at its worst? all Sleep (in general) Poor  Pain is worse with: walking, bending, sitting, standing and some activites Pain improves with: rest, heat/ice and medication Relief from Meds: 1  Mobility walk with assistance use a cane ability to climb steps?  no  Function not employed: date last employed . I need assistance with the following:  meal prep, household duties and shopping  Neuro/Psych No problems in this area  Prior Studies Any changes since last visit?  no  Physicians involved in your care Any changes since last visit?  no   Family History  Problem Relation Age of Onset  . Hypertension Maternal Grandmother   . Anesthesia problems Neg Hx    Social History   Socioeconomic History  . Marital status: Divorced    Spouse name: Not  on file  . Number of children: Not on file  . Years of education: Not on file  . Highest education level: Not on file  Occupational History  . Not on file  Tobacco Use  . Smoking status: Never Smoker  . Smokeless tobacco: Never Used  Substance and Sexual Activity  . Alcohol use: No  . Drug use: No  . Sexual activity: Not Currently    Birth control/protection: None    Comment: IUD removed 2 wks ago.  Other Topics Concern  . Not on file  Social History Narrative  . Not on file   Social Determinants of Health   Financial Resource Strain:   . Difficulty of Paying Living Expenses:   Food Insecurity:   . Worried About Charity fundraiser in the Last Year:   . Arboriculturist in the Last Year:   Transportation Needs:   . Film/video editor (Medical):   Marland Kitchen Lack of Transportation (Non-Medical):   Physical Activity:   . Days of Exercise per Week:   . Minutes of Exercise per Session:   Stress:   . Feeling of Stress :   Social Connections:   . Frequency of Communication with Friends and Family:   . Frequency of Social Gatherings with Friends and Family:   . Attends Religious Services:   . Active Member of Clubs or Organizations:   . Attends Archivist Meetings:   Marland Kitchen Marital Status:    Past Surgical History:  Procedure Laterality Date  . ABDOMINAL HYSTERECTOMY  06/29/11   Robotic Assisted Hysterectomy  . CERVICAL SPINE SURGERY  [place]  . LUMBAR LAMINECTOMY  08/31/2011   Procedure: MICRODISCECTOMY LUMBAR LAMINECTOMY;  Surgeon: Tobi Bastos, MD;  Location: [facility];  Service: Orthopedics;  Laterality: N/A;  lumbar five sacral one central microdisectomy  . MYOMECTOMY  13  . NECK SURGERY    . POSTERIOR CERVICAL FUSION/FORAMINOTOMY N/A 02/02/2013   Procedure: POSTERIOR C5-6 SPINAL FUSION;  Surgeon: Melina Schools, MD;  Location: [place];  Service: Orthopedics;  Laterality: N/A;  . POSTERIOR FUSION CERVICAL SPINE  02/02/2013   C 5 C6     [name]    . WISDOM TOOTH EXTRACTION     Past Medical History:  Diagnosis Date  . Anemia    hx  . Arthritis   . Complication of anesthesia    slow to wake up,   very difficult to get iv had picc line last time  . Difficult intravenous access    hands are usually best for blood draws and IV starts  . DVT (deep venous thrombosis) ([place])    ? vs lupus  . Fibroids   . Headache(784.0)   . Neck pain   . Scoliosis    BP 123/79   Pulse (!) 102   Temp 98.6 F (37 C)   Ht 5\' 5"  (1.651 m)   Wt 264 lb (119.7 kg)   LMP 04/01/2011   SpO2 98%   BMI 43.93 kg/m   Opioid Risk Score:   Fall Risk Score:  `1  Depression screen PHQ 2/9  Depression screen [facility] 2/9 08/24/2018 06/29/2018 05/03/2018 03/07/2018 01/03/2018 09/07/2017 06/07/2017  Decreased Interest 0 0 0 0 0 1 1  Down, Depressed, Hopeless 0 0 0 0 0 1 1  PHQ - 2 Score 0 0 0 0 0 2 2  Altered sleeping - - - - - - -  Tired, decreased energy - - - - - - -  Change in appetite - - - - - - -  Feeling bad or failure about yourself  - - - - - - -  Trouble concentrating - - - - - - -  Moving slowly or fidgety/restless - - - - - - -  Suicidal thoughts - - - - - - -  PHQ-9 Score - - - - - - -  Difficult doing work/chores - - - - - - -    Review of Systems  Constitutional: Negative.   HENT: Negative.   Eyes: Negative.   Respiratory: Negative.   Cardiovascular: Negative.   Gastrointestinal: Negative.   Endocrine: Negative.   Genitourinary: Negative.   Musculoskeletal: Positive for arthralgias and back pain.  Skin: Negative.   Allergic/Immunologic: Negative.   Neurological: Negative.   Hematological: Negative.   Psychiatric/Behavioral: Negative.   All other systems reviewed and are negative.      Objective:   Physical Exam Vitals and nursing note reviewed.  Constitutional:      Appearance: Normal appearance.  Cardiovascular:     Rate and Rhythm: Normal rate and regular rhythm.     Pulses: Normal pulses.     Heart sounds: Normal heart  sounds.  Pulmonary:     Effort: Pulmonary effort is normal.     Breath sounds: Normal breath sounds.  Musculoskeletal:     Cervical back: Normal range of motion and neck supple.     Comments: Normal Muscle Bulk and  Muscle Testing Reveals:  Upper Extremities: Full ROM and Muscle Strength 5/5  Lumbar Paraspinal Tenderness: L-3-L-5 Left Greater Trochanter Tenderness Lower Extremities: Full ROM and Muscle Strength 5/5 Arises from Table slowly Antalgic  Gait   Skin:    General: Skin is warm and dry.  Neurological:     Mental Status: She is alert and oriented to person, place, and time.  Psychiatric:        Mood and Affect: Mood normal.        Behavior: Behavior normal.           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 Status post decompressive laminectomy:Chronic Low Back Pain without Sciatica:Continue to MonitorRefilled:Oxycodone 10mg /325 mg one tablet five times a day as needed#135.09/26/2019 We will continue the opioid monitoring program,this consists of regular clinic visits, examinations, urine drug screen,pill counts as well as use of [place] Controlled Substance Reporting System. 2. Insomnia/ Anxiety:ContinueXanax to 0.5 mg at [facility], educated on the [employer], she verbalizes understanding. 09/26/2019 3. Lumbar Radiculopathy/Neuropathic pain: Continuecurrent medication regimen withGabapentin.09/26/2019. 4. Muscle Spasms. Continuecurrent medication regimen with: Tizanidine.09/26/2019 5. Neurogenic bowel and bladder.Continue to monitor.09/26/2019 6. Constipation/Opioid Therapy: Continuecurrent medication regimen with Linzess. 09/26/2019 7. Depression: Continuecurrent medication regimen withCelexa.09/26/2019 8.LeftGreater Trochanteric Bursitis:Continue Heat/Ice Therapy.09/26/2019 9. Chronic Pain Syndrome: Continuecurrent medication regimen withVoltaren.09/26/2019 10. Chronic Right  Knee Pain:No complaints today. Continue current medication regimenand continue to monitor.09/26/2019 11. Cervicalgia/ Cervical Radiculitis:No complaints today.Continue current medication regimenand HEP as tolerated.. Continue to Monitor.09/26/2019  20 minutes of face to face patient care time was spent during this visit. All questions were encouraged and answered.  F/U in 1 month

## 2019-10-23 ENCOUNTER — Telehealth: Payer: Self-pay | Admitting: Registered Nurse

## 2019-10-23 ENCOUNTER — Encounter: Payer: 59 | Admitting: Registered Nurse

## 2019-10-23 DIAGNOSIS — M7061 Trochanteric bursitis, right hip: Secondary | ICD-10-CM

## 2019-10-23 MED ORDER — OXYCODONE-ACETAMINOPHEN 10-325 MG PO TABS: ORAL_TABLET | ORAL | 0 refills | Status: DC

## 2019-10-23 NOTE — Telephone Encounter (Signed)
Ms. Kiehn,  Ms. Mathwig called the office and sent a My-chart message stating she wasn't feeling well. Her appointment was cancelled. Oxycodone e-scribed today. Ms. Vanorman was instructed to call office to re-schedule her appointment when she is feeling better via My-Chart.

## 2019-11-22 ENCOUNTER — Encounter: Payer: 59 | Attending: Physical Medicine and Rehabilitation | Admitting: Registered Nurse

## 2019-11-22 ENCOUNTER — Encounter: Payer: Self-pay | Admitting: Registered Nurse

## 2019-11-22 ENCOUNTER — Other Ambulatory Visit: Payer: Self-pay

## 2019-11-22 VITALS — BP 130/84 | HR 99 | Temp 98.7°F | Ht 65.0 in | Wt 267.0 lb

## 2019-11-22 DIAGNOSIS — F5101 Primary insomnia: Secondary | ICD-10-CM | POA: Diagnosis present

## 2019-11-22 DIAGNOSIS — M7062 Trochanteric bursitis, left hip: Secondary | ICD-10-CM | POA: Diagnosis present

## 2019-11-22 DIAGNOSIS — G834 Cauda equina syndrome: Secondary | ICD-10-CM | POA: Diagnosis present

## 2019-11-22 DIAGNOSIS — G894 Chronic pain syndrome: Secondary | ICD-10-CM | POA: Diagnosis not present

## 2019-11-22 DIAGNOSIS — M5416 Radiculopathy, lumbar region: Secondary | ICD-10-CM

## 2019-11-22 DIAGNOSIS — Z79891 Long term (current) use of opiate analgesic: Secondary | ICD-10-CM | POA: Diagnosis present

## 2019-11-22 DIAGNOSIS — G8929 Other chronic pain: Secondary | ICD-10-CM

## 2019-11-22 DIAGNOSIS — M48061 Spinal stenosis, lumbar region without neurogenic claudication: Secondary | ICD-10-CM | POA: Insufficient documentation

## 2019-11-22 DIAGNOSIS — Z5181 Encounter for therapeutic drug level monitoring: Secondary | ICD-10-CM | POA: Diagnosis present

## 2019-11-22 DIAGNOSIS — F411 Generalized anxiety disorder: Secondary | ICD-10-CM

## 2019-11-22 DIAGNOSIS — M5126 Other intervertebral disc displacement, lumbar region: Secondary | ICD-10-CM | POA: Insufficient documentation

## 2019-11-22 DIAGNOSIS — M7061 Trochanteric bursitis, right hip: Secondary | ICD-10-CM | POA: Insufficient documentation

## 2019-11-22 DIAGNOSIS — M25561 Pain in right knee: Secondary | ICD-10-CM

## 2019-11-22 DIAGNOSIS — M62830 Muscle spasm of back: Secondary | ICD-10-CM

## 2019-11-22 NOTE — Progress Notes (Signed)
Subjective:    Patient ID: [name], female    DOB: Mar 26, 1970, 49 y.o.   MRN: [id number]  HPI: [name] is a 49 y.o. female who returns for follow up appointment for chronic pain and medication refill. She states her pain is located in her lower back radiating into her left hip and left lower extremity and right knee pain. She rates her pain 10. Her  current exercise regime is pool therapy two days a week, walking and performing stretching exercises.  [name] Morphine equivalent is 67.50 MME. She is also prescribed Alprazolam.We have discussed the black box warning of using opioids and benzodiazepines. I highlighted the dangers of using these drugs together and discussed the adverse events including respiratory suppression, overdose, cognitive impairment and importance of compliance with current regimen. We will continue to monitor and adjust as indicated.     UDS ordered today.    Pain Inventory Average Pain 9 Pain Right Now 10 My pain is sharp, dull, tingling and aching  In the last 24 hours, has pain interfered with the following? General activity 9 Relation with others 9 Enjoyment of life 9 What TIME of day is your pain at its worst? morning , daytime, evening and night Sleep (in general) Poor  Pain is worse with: walking, bending, sitting, standing and some activites Pain improves with: heat/ice, therapy/exercise and medication Relief from Meds: 2  Family History  Problem Relation Age of Onset  . Hypertension Maternal Grandmother   . Anesthesia problems Neg Hx    Social History   Socioeconomic History  . Marital status: Divorced    Spouse name: Not on file  . Number of children: Not on file  . Years of education: Not on file  . Highest education level: Not on file  Occupational History  . Not on file  Tobacco Use  . Smoking status: Never Smoker  . Smokeless tobacco: Never Used  Substance and Sexual Activity  . Alcohol use: No  . Drug use: No  .  Sexual activity: Not Currently    Birth control/protection: None    Comment: IUD removed 2 wks ago.  Other Topics Concern  . Not on file  Social History Narrative  . Not on file   Social Determinants of Health   Financial Resource Strain:   . Difficulty of Paying Living Expenses: Not on file  Food Insecurity:   . Worried About Charity fundraiser in the Last Year: Not on file  . Ran Out of Food in the Last Year: Not on file  Transportation Needs:   . Lack of Transportation (Medical): Not on file  . Lack of Transportation (Non-Medical): Not on file  Physical Activity:   . Days of Exercise per Week: Not on file  . Minutes of Exercise per Session: Not on file  Stress:   . Feeling of Stress : Not on file  Social Connections:   . Frequency of Communication with Friends and Family: Not on file  . Frequency of Social Gatherings with Friends and Family: Not on file  . Attends Religious Services: Not on file  . Active Member of Clubs or Organizations: Not on file  . Attends Archivist Meetings: Not on file  . Marital Status: Not on file   Past Surgical History:  Procedure Laterality Date  . ABDOMINAL HYSTERECTOMY  06/29/11   Robotic Assisted Hysterectomy  . CERVICAL SPINE SURGERY  [place]  . LUMBAR LAMINECTOMY  08/31/2011   Procedure: MICRODISCECTOMY LUMBAR LAMINECTOMY;  Surgeon: Tobi Bastos, MD;  Location: [facility];  Service: Orthopedics;  Laterality: N/A;  lumbar five sacral one central microdisectomy  . MYOMECTOMY  13  . NECK SURGERY    . POSTERIOR CERVICAL FUSION/FORAMINOTOMY N/A 02/02/2013   Procedure: POSTERIOR C5-6 SPINAL FUSION;  Surgeon: Melina Schools, MD;  Location: [place];  Service: Orthopedics;  Laterality: N/A;  . POSTERIOR FUSION CERVICAL SPINE  02/02/2013   C 5 C6     [name]  . WISDOM TOOTH EXTRACTION     Past Surgical History:  Procedure Laterality Date  . ABDOMINAL HYSTERECTOMY  06/29/11   Robotic Assisted Hysterectomy  .  CERVICAL SPINE SURGERY  [place]  . LUMBAR LAMINECTOMY  08/31/2011   Procedure: MICRODISCECTOMY LUMBAR LAMINECTOMY;  Surgeon: Tobi Bastos, MD;  Location: [facility];  Service: Orthopedics;  Laterality: N/A;  lumbar five sacral one central microdisectomy  . MYOMECTOMY  13  . NECK SURGERY    . POSTERIOR CERVICAL FUSION/FORAMINOTOMY N/A 02/02/2013   Procedure: POSTERIOR C5-6 SPINAL FUSION;  Surgeon: Melina Schools, MD;  Location: [place];  Service: Orthopedics;  Laterality: N/A;  . POSTERIOR FUSION CERVICAL SPINE  02/02/2013   C 5 C6     [name]  . WISDOM TOOTH EXTRACTION     Past Medical History:  Diagnosis Date  . Anemia    hx  . Arthritis   . Complication of anesthesia    slow to wake up,   very difficult to get iv had picc line last time  . Difficult intravenous access    hands are usually best for blood draws and IV starts  . DVT (deep venous thrombosis) ([place])    ? vs lupus  . Fibroids   . Headache(784.0)   . Neck pain   . Scoliosis    BP 130/84   Pulse 99   Temp 98.7 F (37.1 C)   Ht 5\' 5"  (1.651 m)   Wt 267 lb (121.1 kg)   LMP 04/01/2011   SpO2 95%   BMI 44.43 kg/m   Opioid Risk Score:   Fall Risk Score:  `1  Depression screen PHQ 2/9  Depression screen [facility] 2/9 08/24/2018 06/29/2018 05/03/2018 03/07/2018 01/03/2018 09/07/2017 06/07/2017  Decreased Interest 0 0 0 0 0 1 1  Down, Depressed, Hopeless 0 0 0 0 0 1 1  PHQ - 2 Score 0 0 0 0 0 2 2  Altered sleeping - - - - - - -  Tired, decreased energy - - - - - - -  Change in appetite - - - - - - -  Feeling bad or failure about yourself  - - - - - - -  Trouble concentrating - - - - - - -  Moving slowly or fidgety/restless - - - - - - -  Suicidal thoughts - - - - - - -  PHQ-9 Score - - - - - - -  Difficult doing work/chores - - - - - - -    Review of Systems  Constitutional: Negative.   HENT: Negative.   Eyes: Negative.   Respiratory: Negative.   Cardiovascular: Negative.   Gastrointestinal:  Negative.   Endocrine: Negative.   Genitourinary: Negative.   Musculoskeletal: Positive for arthralgias, back pain and gait problem.  Skin: Negative.   Allergic/Immunologic: Negative.   Hematological: Negative.   Psychiatric/Behavioral: Negative.   All other systems reviewed and are negative.  Objective:   Physical Exam Vitals and nursing note reviewed.  Constitutional:      Appearance: Normal appearance. She is obese.  Cardiovascular:     Rate and Rhythm: Normal rate and regular rhythm.     Pulses: Normal pulses.     Heart sounds: Normal heart sounds.  Pulmonary:     Effort: Pulmonary effort is normal.     Breath sounds: Normal breath sounds.  Musculoskeletal:     Cervical back: Normal range of motion and neck supple.     Comments: Normal Muscle Bulk and Muscle Testing Reveals:  Upper Extremities: Full ROM and Muscle Strength 5/5 Lumbar Hypersensitivity Left Greater Trochanteric Tenderness Lower Extremities: Right Decreased ROM and Muscle Strength 5/5 Right Lower Extremity Flexion Produces Pain into her right Patella  Left Lower Extremity: Full ROM and Muscle Strength 5/5 Arises from Table Slowly using cane for support Narrow Based  Gait   Skin:    General: Skin is warm and dry.  Neurological:     Mental Status: She is alert and oriented to person, place, and time.  Psychiatric:        Mood and Affect: Mood normal.        Behavior: Behavior normal.           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 Status post decompressive laminectomy:Chronic Low Back Pain without Sciatica:Continue to MonitorRefilled:Oxycodone 10mg /325 mg one tablet five times a day as needed#135.11/22/2019 We will continue the opioid monitoring program, this consists of regular clinic visits, examinations, urine drug screen, pill counts as well as use of [place] Controlled Substance Reporting system. A 12 month History has  been reviewed on the [place] on 11/22/2019. 2. Insomnia/ Anxiety:ContinueXanax to 0.5 mg at [facility], educated on the [employer], she verbalizes understanding. 11/22/2019 3. Lumbar Radiculopathy/Neuropathic pain: Continuecurrent medication regimen withGabapentin.11/22/2019. 4. Muscle Spasms. Continuecurrent medication regimen with: Tizanidine.11/22/2019 5. Neurogenic bowel and bladder.Continue to monitor.11/22/2019 6. Constipation/Opioid Therapy: Continuecurrent medication regimen with Linzess. 11/22/2019 7. Depression: Continuecurrent medication regimen withCelexa.11/22/2019 8.LeftGreater Trochanteric Bursitis:Continue Heat/Ice Therapy.11/22/2019 9. Chronic Pain Syndrome: Continuecurrent medication regimen withVoltaren.11/22/2019 10. Chronic Right Knee Pain:Continue HEP as Tolerated. Continue current medication regimenand continue to monitor.11/22/2019 11. Cervicalgia/ Cervical Radiculitis:No complaints today.Continue current medication regimenand HEP as tolerated.. Continue to Monitor.11/22/2019  20 minutes of face to face patient care time was spent during this visit. All questions were encouraged and answered.  F/U in 1 month

## 2019-11-24 ENCOUNTER — Telehealth: Payer: Self-pay | Admitting: Registered Nurse

## 2019-11-24 DIAGNOSIS — G834 Cauda equina syndrome: Secondary | ICD-10-CM

## 2019-11-24 DIAGNOSIS — M7062 Trochanteric bursitis, left hip: Secondary | ICD-10-CM

## 2019-11-24 DIAGNOSIS — M7061 Trochanteric bursitis, right hip: Secondary | ICD-10-CM

## 2019-11-24 MED ORDER — CITALOPRAM HYDROBROMIDE 10 MG PO TABS: 10.0000 mg | ORAL_TABLET | Freq: Every day | ORAL | 3 refills | Status: DC

## 2019-11-24 MED ORDER — TIZANIDINE HCL 4 MG PO TABS: ORAL_TABLET | ORAL | 3 refills | Status: DC

## 2019-11-24 MED ORDER — DICLOFENAC SODIUM 75 MG PO TBEC: 75.0000 mg | DELAYED_RELEASE_TABLET | Freq: Two times a day (BID) | ORAL | 3 refills | Status: DC

## 2019-11-24 MED ORDER — GABAPENTIN 300 MG PO CAPS: ORAL_CAPSULE | ORAL | 3 refills | Status: DC

## 2019-11-24 MED ORDER — OXYCODONE-ACETAMINOPHEN 10-325 MG PO TABS: ORAL_TABLET | ORAL | 0 refills | Status: DC

## 2019-11-24 NOTE — Telephone Encounter (Signed)
Return [name] call,  PMP was Reviewed and Oxycodone was e-scribed. [name] is aware of the above and verbalizes understanding.

## 2019-11-24 NOTE — Telephone Encounter (Signed)
Medications: Filled.

## 2019-11-25 LAB — TOXASSURE SELECT,+ANTIDEPR,UR

## 2019-11-29 ENCOUNTER — Telehealth: Payer: Self-pay | Admitting: *Deleted

## 2019-11-29 NOTE — Telephone Encounter (Signed)
Urine drug screen for this encounter is consistent for prescribed medication 

## 2019-12-25 ENCOUNTER — Encounter: Payer: Self-pay | Admitting: Registered Nurse

## 2019-12-25 ENCOUNTER — Other Ambulatory Visit: Payer: Self-pay

## 2019-12-25 ENCOUNTER — Encounter: Payer: 59 | Attending: Physical Medicine and Rehabilitation | Admitting: Registered Nurse

## 2019-12-25 VITALS — BP 141/92 | HR 99 | Temp 97.8°F | Ht 65.0 in | Wt 264.4 lb

## 2019-12-25 DIAGNOSIS — G894 Chronic pain syndrome: Secondary | ICD-10-CM | POA: Diagnosis present

## 2019-12-25 DIAGNOSIS — M7061 Trochanteric bursitis, right hip: Secondary | ICD-10-CM | POA: Diagnosis present

## 2019-12-25 DIAGNOSIS — Z79891 Long term (current) use of opiate analgesic: Secondary | ICD-10-CM | POA: Diagnosis present

## 2019-12-25 DIAGNOSIS — F411 Generalized anxiety disorder: Secondary | ICD-10-CM | POA: Diagnosis present

## 2019-12-25 DIAGNOSIS — M7062 Trochanteric bursitis, left hip: Secondary | ICD-10-CM | POA: Diagnosis not present

## 2019-12-25 DIAGNOSIS — M5416 Radiculopathy, lumbar region: Secondary | ICD-10-CM

## 2019-12-25 DIAGNOSIS — F5101 Primary insomnia: Secondary | ICD-10-CM

## 2019-12-25 DIAGNOSIS — M62830 Muscle spasm of back: Secondary | ICD-10-CM

## 2019-12-25 DIAGNOSIS — G834 Cauda equina syndrome: Secondary | ICD-10-CM | POA: Diagnosis not present

## 2019-12-25 DIAGNOSIS — Z5181 Encounter for therapeutic drug level monitoring: Secondary | ICD-10-CM | POA: Diagnosis present

## 2019-12-25 MED ORDER — OXYCODONE-ACETAMINOPHEN 10-325 MG PO TABS: ORAL_TABLET | ORAL | 0 refills | Status: DC

## 2019-12-25 NOTE — Progress Notes (Signed)
Subjective:    Patient ID: [name], female    DOB: 01-06-1971, 49 y.o.   MRN: [id number]  HPI: [name] is a 49 y.o. female who returns for follow up appointment for chronic pain and medication refill. She states her pain is located in her lower back radiating into her left buttock and left hip. She rates her pain 9. Her  current exercise regime is walking, water aerbics twice a week  and performing stretching exercises.  Ms. Welcome Morphine equivalent is 75.00 MME. She  is also prescribed Alprazolam. We have discussed the black box warning of using opioids and benzodiazepines. I highlighted the dangers of using these drugs together and discussed the adverse events including respiratory suppression, overdose, cognitive impairment and importance of compliance with current regimen. We will continue to monitor and adjust as indicated.   Last UDS was Performed on 11/22/2019, it was consistent.     Pain Inventory Average Pain 9 Pain Right Now 9 My pain is constant, tingling and aching  In the last 24 hours, has pain interfered with the following? General activity 9 Relation with others 9 Enjoyment of life 9 What TIME of day is your pain at its worst? morning , daytime, evening and night Sleep (in general) Poor  Pain is worse with: walking, bending, sitting and standing Pain improves with: rest, heat/ice and medication Relief from Meds: 1  Family History  Problem Relation Age of Onset  . Hypertension Maternal Grandmother   . Anesthesia problems Neg Hx    Social History   Socioeconomic History  . Marital status: Divorced    Spouse name: Not on file  . Number of children: Not on file  . Years of education: Not on file  . Highest education level: Not on file  Occupational History  . Not on file  Tobacco Use  . Smoking status: Never Smoker  . Smokeless tobacco: Never Used  Substance and Sexual Activity  . Alcohol use: No  . Drug use: No  . Sexual activity: Not  Currently    Birth control/protection: None    Comment: IUD removed 2 wks ago.  Other Topics Concern  . Not on file  Social History Narrative  . Not on file   Social Determinants of Health   Financial Resource Strain:   . Difficulty of Paying Living Expenses: Not on file  Food Insecurity:   . Worried About Charity fundraiser in the Last Year: Not on file  . Ran Out of Food in the Last Year: Not on file  Transportation Needs:   . Lack of Transportation (Medical): Not on file  . Lack of Transportation (Non-Medical): Not on file  Physical Activity:   . Days of Exercise per Week: Not on file  . Minutes of Exercise per Session: Not on file  Stress:   . Feeling of Stress : Not on file  Social Connections:   . Frequency of Communication with Friends and Family: Not on file  . Frequency of Social Gatherings with Friends and Family: Not on file  . Attends Religious Services: Not on file  . Active Member of Clubs or Organizations: Not on file  . Attends Archivist Meetings: Not on file  . Marital Status: Not on file   Past Surgical History:  Procedure Laterality Date  . ABDOMINAL HYSTERECTOMY  06/29/11   Robotic Assisted Hysterectomy  . CERVICAL SPINE SURGERY  [place]  . LUMBAR LAMINECTOMY  08/31/2011  Procedure: MICRODISCECTOMY LUMBAR LAMINECTOMY;  Surgeon: Tobi Bastos, MD;  Location: [facility];  Service: Orthopedics;  Laterality: N/A;  lumbar five sacral one central microdisectomy  . MYOMECTOMY  13  . NECK SURGERY    . POSTERIOR CERVICAL FUSION/FORAMINOTOMY N/A 02/02/2013   Procedure: POSTERIOR C5-6 SPINAL FUSION;  Surgeon: Melina Schools, MD;  Location: [place];  Service: Orthopedics;  Laterality: N/A;  . POSTERIOR FUSION CERVICAL SPINE  02/02/2013   C 5 C6     [name]  . WISDOM TOOTH EXTRACTION     Past Surgical History:  Procedure Laterality Date  . ABDOMINAL HYSTERECTOMY  06/29/11   Robotic Assisted Hysterectomy  . CERVICAL SPINE SURGERY  [place]  . LUMBAR LAMINECTOMY  08/31/2011   Procedure: MICRODISCECTOMY LUMBAR LAMINECTOMY;  Surgeon: Tobi Bastos, MD;  Location: [facility];  Service: Orthopedics;  Laterality: N/A;  lumbar five sacral one central microdisectomy  . MYOMECTOMY  13  . NECK SURGERY    . POSTERIOR CERVICAL FUSION/FORAMINOTOMY N/A 02/02/2013   Procedure: POSTERIOR C5-6 SPINAL FUSION;  Surgeon: Melina Schools, MD;  Location: [place];  Service: Orthopedics;  Laterality: N/A;  . POSTERIOR FUSION CERVICAL SPINE  02/02/2013   C 5 C6     [name]  . WISDOM TOOTH EXTRACTION     Past Medical History:  Diagnosis Date  . Anemia    hx  . Arthritis   . Complication of anesthesia    slow to wake up,   very difficult to get iv had picc line last time  . Difficult intravenous access    hands are usually best for blood draws and IV starts  . DVT (deep venous thrombosis) ([place])    ? vs lupus  . Fibroids   . Headache(784.0)   . Neck pain   . Scoliosis    Temp 97.8 F (36.6 C)   Ht 5\' 5"  (1.651 m)   Wt 264 lb 6.4 oz (119.9 kg)   LMP 04/01/2011   BMI 44.00 kg/m   Opioid Risk Score:   Fall Risk Score:  `1  Depression screen PHQ 2/9  Depression screen [facility] 2/9 08/24/2018 06/29/2018 05/03/2018 03/07/2018 01/03/2018 09/07/2017 06/07/2017  Decreased Interest 0 0 0 0 0 1 1  Down, Depressed, Hopeless 0 0 0 0 0 1 1  PHQ - 2 Score 0 0 0 0 0 2 2  Altered sleeping - - - - - - -  Tired, decreased energy - - - - - - -  Change in appetite - - - - - - -  Feeling bad or failure about yourself  - - - - - - -  Trouble concentrating - - - - - - -  Moving slowly or fidgety/restless - - - - - - -  Suicidal thoughts - - - - - - -  PHQ-9 Score - - - - - - -  Difficult doing work/chores - - - - - - -    Review of Systems  Musculoskeletal: Positive for back pain.       Leg pain  All other systems reviewed and are negative.      Objective:   Physical Exam Vitals and nursing note reviewed.  Constitutional:       Appearance: Normal appearance. She is obese.  Cardiovascular:     Rate and Rhythm: Normal rate and regular rhythm.     Pulses: Normal pulses.     Heart sounds: Normal heart sounds.  Pulmonary:     Effort: Pulmonary effort is normal.     Breath sounds: Normal breath sounds.  Musculoskeletal:     Cervical back: Normal range of motion and neck supple.     Comments: Normal Muscle Bulk and Muscle Testing Reveals:  Upper Extremities: Full ROM and Muscle Strength 5/5  Lumbar Hypersensitivity Left Greater Trochanter Tenderness Lower Extremities: Full ROM and Muscle Strength 5/5 Arises from Table slowly using cane for support Antalgic Gait   Skin:    General: Skin is warm and dry.  Neurological:     Mental Status: She is alert and oriented to person, place, and time.  Psychiatric:        Mood and Affect: Mood normal.        Behavior: Behavior normal.           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 Status post decompressive laminectomy:Chronic Low Back Pain without Sciatica:Continue to MonitorRefilled:Oxycodone 10mg /325 mg one tablet five times a day as needed#135.12/25/2019 We will continue the opioid monitoring program, this consists of regular clinic visits, examinations, urine drug screen, pill counts as well as use of [place] Controlled Substance Reporting system. A 12 month History has been reviewed on the [place] on 11/22/2019. 2. Insomnia/ Anxiety:ContinueXanax [facility].5 [facility], educated on the [employer], she verbalizes understanding. 12/25/2019 3. Lumbar Radiculopathy/Neuropathic pain: Continuecurrent medication regimen withGabapentin.12/25/2019. 4. Muscle Spasms. Continuecurrent medication regimen with: Tizanidine.12/25/2019 5. Neurogenic bowel and bladder.Continue to monitor.12/25/2019 6. Constipation/Opioid Therapy: Continuecurrent medication  regimen with Linzess. 12/25/2019 7. Depression: Continuecurrent medication regimen withCelexa.12/25/2019 8.LeftGreater Trochanteric Bursitis:Continue Heat/Ice Therapy.12/25/2019 9. Chronic Pain Syndrome: Continuecurrent medication regimen withVoltaren.12/25/2019 10. Chronic Right Knee Pain:No complaints today.Continue HEP as Tolerated.Continue current medication regimenand continue to monitor.12/25/2019 11. Cervicalgia/ Cervical Radiculitis:No complaints today.Continue current medication regimenand HEP as tolerated.. Continue to Monitor.12/25/2019  44minutes of face to face patient care time was spent during this visit. All questions were encouraged and answered.  F/U in 1 month

## 2020-01-23 ENCOUNTER — Encounter: Payer: Self-pay | Admitting: Registered Nurse

## 2020-01-23 ENCOUNTER — Encounter: Payer: 59 | Attending: Physical Medicine and Rehabilitation | Admitting: Registered Nurse

## 2020-01-23 ENCOUNTER — Other Ambulatory Visit: Payer: Self-pay

## 2020-01-23 VITALS — BP 128/90 | HR 95 | Temp 97.7°F | Ht 65.0 in | Wt 265.0 lb

## 2020-01-23 DIAGNOSIS — G894 Chronic pain syndrome: Secondary | ICD-10-CM

## 2020-01-23 DIAGNOSIS — Z79891 Long term (current) use of opiate analgesic: Secondary | ICD-10-CM | POA: Diagnosis present

## 2020-01-23 DIAGNOSIS — F411 Generalized anxiety disorder: Secondary | ICD-10-CM

## 2020-01-23 DIAGNOSIS — M5416 Radiculopathy, lumbar region: Secondary | ICD-10-CM | POA: Diagnosis present

## 2020-01-23 DIAGNOSIS — M7062 Trochanteric bursitis, left hip: Secondary | ICD-10-CM | POA: Diagnosis present

## 2020-01-23 DIAGNOSIS — M7061 Trochanteric bursitis, right hip: Secondary | ICD-10-CM

## 2020-01-23 DIAGNOSIS — F5101 Primary insomnia: Secondary | ICD-10-CM | POA: Diagnosis present

## 2020-01-23 DIAGNOSIS — M79672 Pain in left foot: Secondary | ICD-10-CM

## 2020-01-23 DIAGNOSIS — G834 Cauda equina syndrome: Secondary | ICD-10-CM | POA: Diagnosis present

## 2020-01-23 DIAGNOSIS — M62830 Muscle spasm of back: Secondary | ICD-10-CM

## 2020-01-23 DIAGNOSIS — Z5181 Encounter for therapeutic drug level monitoring: Secondary | ICD-10-CM

## 2020-01-23 MED ORDER — OXYCODONE-ACETAMINOPHEN 10-325 MG PO TABS: ORAL_TABLET | ORAL | 0 refills | Status: DC

## 2020-01-23 NOTE — Progress Notes (Signed)
Subjective:    Patient ID: [name], female    DOB: 1970-06-22, 49 y.o.   MRN: [id number]  HPI: [name] is a 49 y.o. female who returns for follow up appointment for chronic pain and medication refill. She states her pain is located in her lower back radiating into her left hip and left foot pain onset on Sunday 01/21/2020, denies falling.  He rates his pain 9. Her current exercise regime is walking.   [name] Morphine equivalent is 75.00  MME.She is also prescribed alprazolam. We have discussed the black box warning of using opioids and benzodiazepines. I highlighted the dangers of using these drugs together and discussed the adverse events including respiratory suppression, overdose, cognitive impairment and importance of compliance with current regimen. We will continue to monitor and adjust as indicated.     Last UDS was Performed on 11/22/2019, it was consistent.    Pain Inventory Average Pain 9 Pain Right Now 9 My pain is constant, dull, stabbing, tingling and aching  In the last 24 hours, has pain interfered with the following? General activity 9 Relation with others 9 Enjoyment of life 9 What TIME of day is your pain at its worst? morning , daytime, evening and night Sleep (in general) Poor  Pain is worse with: walking, bending, sitting and standing Pain improves with: rest and medication Relief from Meds: 2  Family History  Problem Relation Age of Onset  . Hypertension Maternal Grandmother   . Anesthesia problems Neg Hx    Social History   Socioeconomic History  . Marital status: Divorced    Spouse name: Not on file  . Number of children: Not on file  . Years of education: Not on file  . Highest education level: Not on file  Occupational History  . Not on file  Tobacco Use  . Smoking status: Never Smoker  . Smokeless tobacco: Never Used  Substance and Sexual Activity  . Alcohol use: No  . Drug use: No  . Sexual activity: Not Currently     Birth control/protection: None    Comment: IUD removed 2 wks ago.  Other Topics Concern  . Not on file  Social History Narrative  . Not on file   Social Determinants of Health   Financial Resource Strain:   . Difficulty of Paying Living Expenses: Not on file  Food Insecurity:   . Worried About Charity fundraiser in the Last Year: Not on file  . Ran Out of Food in the Last Year: Not on file  Transportation Needs:   . Lack of Transportation (Medical): Not on file  . Lack of Transportation (Non-Medical): Not on file  Physical Activity:   . Days of Exercise per Week: Not on file  . Minutes of Exercise per Session: Not on file  Stress:   . Feeling of Stress : Not on file  Social Connections:   . Frequency of Communication with Friends and Family: Not on file  . Frequency of Social Gatherings with Friends and Family: Not on file  . Attends Religious Services: Not on file  . Active Member of Clubs or Organizations: Not on file  . Attends Archivist Meetings: Not on file  . Marital Status: Not on file   Past Surgical History:  Procedure Laterality Date  . ABDOMINAL HYSTERECTOMY  06/29/11   Robotic Assisted Hysterectomy  . CERVICAL SPINE SURGERY  [place]  . LUMBAR LAMINECTOMY  08/31/2011  Procedure: MICRODISCECTOMY LUMBAR LAMINECTOMY;  Surgeon: Tobi Bastos, MD;  [facility];  Service: Orthopedics;  Laterality: N/A;  lumbar five sacral one central microdisectomy  . MYOMECTOMY  13  . NECK SURGERY    . POSTERIOR CERVICAL FUSION/FORAMINOTOMY N/A 02/02/2013   Procedure: POSTERIOR C5-6 SPINAL FUSION;  Surgeon: Melina Schools, MD;  Location: [place];  Service: Orthopedics;  Laterality: N/A;  . POSTERIOR FUSION CERVICAL SPINE  02/02/2013   C 5 C6     [name]  . WISDOM TOOTH EXTRACTION     Past Surgical History:  Procedure Laterality Date  . ABDOMINAL HYSTERECTOMY  06/29/11   Robotic Assisted Hysterectomy  . CERVICAL SPINE SURGERY  [place]  . LUMBAR LAMINECTOMY  08/31/2011   Procedure: MICRODISCECTOMY LUMBAR LAMINECTOMY;  Surgeon: Tobi Bastos, MD;  Location[facility];  Service: Orthopedics;  Laterality: N/A;  lumbar five sacral one central microdisectomy  . MYOMECTOMY  13  . NECK SURGERY    . POSTERIOR CERVICAL FUSION/FORAMINOTOMY N/A 02/02/2013   Procedure: POSTERIOR C5-6 SPINAL FUSION;  Surgeon: Melina Schools, MD;  Location: [place];  Service: Orthopedics;  Laterality: N/A;  . POSTERIOR FUSION CERVICAL SPINE  02/02/2013   C 5 C6     [name]  . WISDOM TOOTH EXTRACTION     Past Medical History:  Diagnosis Date  . Anemia    hx  . Arthritis   . Complication of anesthesia    slow to wake up,   very difficult to get iv had picc line last time  . Difficult intravenous access    hands are usually best for blood draws and IV starts  . DVT (deep venous thrombosis) ([place])    ? vs lupus  . Fibroids   . Headache(784.0)   . Neck pain   . Scoliosis    BP 128/90   Pulse 95   Temp 97.7 F (36.5 C)   Ht 5\' 5"  (1.651 m)   Wt 265 lb (120.2 kg)   LMP 04/01/2011   BMI 44.10 kg/m   Opioid Risk Score:   Fall Risk Score:  `1  Depression screen PHQ 2/9  Depression screen [facility] 2/9 08/24/2018 06/29/2018 05/03/2018 03/07/2018 01/03/2018 09/07/2017 06/07/2017  Decreased Interest 0 0 0 0 0 1 1  Down, Depressed, Hopeless 0 0 0 0 0 1 1  PHQ - 2 Score 0 0 0 0 0 2 2  Altered sleeping - - - - - - -  Tired, decreased energy - - - - - - -  Change in appetite - - - - - - -  Feeling bad or failure about yourself  - - - - - - -  Trouble concentrating - - - - - - -  Moving slowly or fidgety/restless - - - - - - -  Suicidal thoughts - - - - - - -  PHQ-9 Score - - - - - - -  Difficult doing work/chores - - - - - - -   Review of Systems  Constitutional: Negative.   HENT: Negative.   Eyes: Negative.   Respiratory: Negative.   Cardiovascular: Negative.   Gastrointestinal: Negative.   Endocrine: Negative.   Genitourinary:  Negative.   Musculoskeletal: Positive for back pain and gait problem.       Left foot pain  Skin: Negative.   Allergic/Immunologic: Negative.   Hematological: Negative.   Psychiatric/Behavioral: Negative.   All other systems reviewed and are negative.  Objective:   Physical Exam Vitals and nursing note reviewed.  Constitutional:      Appearance: Normal appearance. She is obese.  Cardiovascular:     Rate and Rhythm: Normal rate and regular rhythm.     Pulses: Normal pulses.     Heart sounds: Normal heart sounds.  Pulmonary:     Effort: Pulmonary effort is normal.     Breath sounds: Normal breath sounds.  Musculoskeletal:     Cervical back: Normal range of motion and neck supple. No rigidity.     Comments: Normal Muscle Bulk and Muscle Testing Reveals:  Upper Extremities: Full ROM and Muscle Strength 5/5  Lumbar Paraspinal Tenderness: L-3-L-5 Left Greater Trochanter Tenderness Lower Extremities: Full ROM and Muscle Strength 5/5 Arises from Table slowly using cane for support Narrow Based  Gait   Skin:    General: Skin is warm and dry.  Neurological:     Mental Status: She is alert and oriented to person, place, and time.  Psychiatric:        Mood and Affect: Mood normal.        Behavior: Behavior normal.           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 Status post decompressive laminectomy:Chronic Low Back Pain without Sciatica:Continue to MonitorRefilled:Oxycodone 10mg /325 mg one tablet five times a day as needed#135.01/23/2020 We will continue the opioid monitoring program, this consists of regular clinic visits, examinations, urine drug screen, pill counts as well as use of [place] Controlled Substance Reporting system. A 12 month History has been reviewed on the [place] Controlled Substance Reporting Systemon 01/23/2020. 2. Insomnia/ Anxiety:ContinueXanax to 0.5 mg at [facility], educated  on the [employer], she verbalizes understanding. 01/23/2020 3. Lumbar Radiculopathy/Neuropathic pain: Continuecurrent medication regimen withGabapentin.01/23/2020. 4. Muscle Spasms. Continuecurrent medication regimen with: Tizanidine.01/23/2020 5. Neurogenic bowel and bladder.Continue to monitor.01/23/2020 6. Constipation/Opioid Therapy: Continuecurrent medication regimen with Linzess. 01/23/2020 7. Depression: Continuecurrent medication regimen withCelexa.01/23/2020 8.LeftGreater Trochanteric Bursitis:Continue Heat/Ice Therapy.01/23/2020 9. Chronic Pain Syndrome: Continuecurrent medication regimen withVoltaren.01/23/2020 10. Chronic Right Knee Pain:No complaints today.Continue HEP as Tolerated.Continue current medication regimenand continue to monitor.01/23/2020 11. Cervicalgia/ Cervical Radiculitis:No complaints today.Continue current medication regimenand HEP as tolerated.. Continue to Monitor.01/23/2020  23minutes of face to face patient care time was spent during this visit. All questions were encouraged and answered.  F/U in 1 month

## 2020-02-23 ENCOUNTER — Encounter: Payer: 59 | Attending: Physical Medicine and Rehabilitation | Admitting: Registered Nurse

## 2020-02-23 ENCOUNTER — Encounter: Payer: Self-pay | Admitting: Registered Nurse

## 2020-02-23 ENCOUNTER — Other Ambulatory Visit: Payer: Self-pay

## 2020-02-23 VITALS — BP 130/87 | HR 94 | Temp 98.0°F | Ht 65.0 in | Wt 264.6 lb

## 2020-02-23 DIAGNOSIS — G894 Chronic pain syndrome: Secondary | ICD-10-CM | POA: Diagnosis present

## 2020-02-23 DIAGNOSIS — G834 Cauda equina syndrome: Secondary | ICD-10-CM | POA: Diagnosis not present

## 2020-02-23 DIAGNOSIS — F5101 Primary insomnia: Secondary | ICD-10-CM | POA: Diagnosis present

## 2020-02-23 DIAGNOSIS — M7989 Other specified soft tissue disorders: Secondary | ICD-10-CM | POA: Insufficient documentation

## 2020-02-23 DIAGNOSIS — M7062 Trochanteric bursitis, left hip: Secondary | ICD-10-CM | POA: Diagnosis not present

## 2020-02-23 DIAGNOSIS — F411 Generalized anxiety disorder: Secondary | ICD-10-CM | POA: Insufficient documentation

## 2020-02-23 DIAGNOSIS — M79605 Pain in left leg: Secondary | ICD-10-CM | POA: Insufficient documentation

## 2020-02-23 DIAGNOSIS — Z5181 Encounter for therapeutic drug level monitoring: Secondary | ICD-10-CM | POA: Insufficient documentation

## 2020-02-23 DIAGNOSIS — M62830 Muscle spasm of back: Secondary | ICD-10-CM | POA: Insufficient documentation

## 2020-02-23 DIAGNOSIS — M5416 Radiculopathy, lumbar region: Secondary | ICD-10-CM | POA: Insufficient documentation

## 2020-02-23 DIAGNOSIS — M7061 Trochanteric bursitis, right hip: Secondary | ICD-10-CM | POA: Diagnosis present

## 2020-02-23 MED ORDER — OXYCODONE-ACETAMINOPHEN 10-325 MG PO TABS: ORAL_TABLET | ORAL | 0 refills | Status: DC

## 2020-02-23 MED ORDER — TIZANIDINE HCL 4 MG PO TABS: ORAL_TABLET | ORAL | 3 refills | Status: DC

## 2020-02-23 MED ORDER — CITALOPRAM HYDROBROMIDE 10 MG PO TABS: 10.0000 mg | ORAL_TABLET | Freq: Every day | ORAL | 3 refills | Status: DC

## 2020-02-23 MED ORDER — DICLOFENAC SODIUM 75 MG PO TBEC: 75.0000 mg | DELAYED_RELEASE_TABLET | Freq: Two times a day (BID) | ORAL | 3 refills | Status: DC

## 2020-02-23 MED ORDER — GABAPENTIN 300 MG PO CAPS: ORAL_CAPSULE | ORAL | 3 refills | Status: DC

## 2020-02-23 NOTE — Progress Notes (Signed)
Subjective:    Patient ID: [name], female    DOB: 1970-10-09, 49 y.o.   MRN: [id number]  HPI: [name] is a 49 y.o. female who returns for follow up appointment for chronic pain and medication refill. She states her pain is located in her lower back radiating into her left hip and leftlower extremity. Also reports left lower extremity swelling with increase intensity of pain for the last week. She rates her pain 9. Her current exercise regime is walking and performing stretching exercises.  [name] equivalent is 65.00  MME.  Last UDS was Performed on 11/22/2019, it was consistent.    Pain Inventory Average Pain 9 Pain Right Now 9 My pain is constant, sharp, burning, dull, stabbing, tingling and aching  In the last 24 hours, has pain interfered with the following? General activity 9 Relation with others 9 Enjoyment of life 9 What TIME of day is your pain at its worst? morning , daytime, evening and night Sleep (in general) Poor  Pain is worse with: walking, bending, sitting and standing Pain improves with: rest, heat/ice, therapy/exercise and medication Relief from Meds: 2  Family History  Problem Relation Age of Onset  . Hypertension Maternal Grandmother   . Anesthesia problems Neg Hx    Social History   Socioeconomic History  . Marital status: Divorced    Spouse name: Not on file  . Number of children: Not on file  . Years of education: Not on file  . Highest education level: Not on file  Occupational History  . Not on file  Tobacco Use  . Smoking status: Never Smoker  . Smokeless tobacco: Never Used  Vaping Use  . Vaping Use: Never used  Substance and Sexual Activity  . Alcohol use: No  . Drug use: No  . Sexual activity: Not Currently    Birth control/protection: None    Comment: IUD removed 2 wks ago.  Other Topics Concern  . Not on file  Social History Narrative  . Not on file   Social Determinants of Health   Financial Resource  Strain:   . Difficulty of Paying Living Expenses: Not on file  Food Insecurity:   . Worried About Charity fundraiser in the Last Year: Not on file  . Ran Out of Food in the Last Year: Not on file  Transportation Needs:   . Lack of Transportation (Medical): Not on file  . Lack of Transportation (Non-Medical): Not on file  Physical Activity:   . Days of Exercise per Week: Not on file  . Minutes of Exercise per Session: Not on file  Stress:   . Feeling of Stress : Not on file  Social Connections:   . Frequency of Communication with Friends and Family: Not on file  . Frequency of Social Gatherings with Friends and Family: Not on file  . Attends Religious Services: Not on file  . Active Member of Clubs or Organizations: Not on file  . Attends Archivist Meetings: Not on file  . Marital Status: Not on file   Past Surgical History:  Procedure Laterality Date  . ABDOMINAL HYSTERECTOMY  06/29/11   Robotic Assisted Hysterectomy  . CERVICAL SPINE SURGERY  [place]  . LUMBAR LAMINECTOMY  08/31/2011   Procedure: MICRODISCECTOMY LUMBAR LAMINECTOMY;  Surgeon: Tobi Bastos, MD;  Location: [facility];  Service: Orthopedics;  Laterality: N/A;  lumbar five sacral one central microdisectomy  . MYOMECTOMY  13  . NECK SURGERY    . POSTERIOR CERVICAL FUSION/FORAMINOTOMY N/A 02/02/2013   Procedure: POSTERIOR C5-6 SPINAL FUSION;  Surgeon: Melina Schools, MD;  Location: [place];  Service: Orthopedics;  Laterality: N/A;  . POSTERIOR FUSION CERVICAL SPINE  02/02/2013   C 5 C6     [name]  . WISDOM TOOTH EXTRACTION     Past Surgical History:  Procedure Laterality Date  . ABDOMINAL HYSTERECTOMY  06/29/11   Robotic Assisted Hysterectomy  . CERVICAL SPINE SURGERY  [place]  . LUMBAR LAMINECTOMY  08/31/2011   Procedure: MICRODISCECTOMY LUMBAR LAMINECTOMY;  Surgeon: Tobi Bastos, MD;  [facility];  Service: Orthopedics;  Laterality: N/A;  lumbar five  sacral one central microdisectomy  . MYOMECTOMY  13  . NECK SURGERY    . POSTERIOR CERVICAL FUSION/FORAMINOTOMY N/A 02/02/2013   Procedure: POSTERIOR C5-6 SPINAL FUSION;  Surgeon: Melina Schools, MD;  Location: [place];  Service: Orthopedics;  Laterality: N/A;  . POSTERIOR FUSION CERVICAL SPINE  02/02/2013   C 5 C6     [name]  . WISDOM TOOTH EXTRACTION     Past Medical History:  Diagnosis Date  . Anemia    hx  . Arthritis   . Complication of anesthesia    slow to wake up,   very difficult to get iv had picc line last time  . Difficult intravenous access    hands are usually best for blood draws and IV starts  . DVT (deep venous thrombosis) ([place])    ? vs lupus  . Fibroids   . Headache(784.0)   . Neck pain   . Scoliosis    LMP 04/01/2011   Opioid Risk Score:   Fall Risk Score:  `1  Depression screen PHQ 2/9  Depression screen [facility] 2/9 01/23/2020 08/24/2018 06/29/2018 05/03/2018 03/07/2018 01/03/2018 09/07/2017  Decreased Interest 0 0 0 0 0 0 1  Down, Depressed, Hopeless 0 0 0 0 0 0 1  PHQ - 2 Score 0 0 0 0 0 0 2  Altered sleeping - - - - - - -  Tired, decreased energy - - - - - - -  Change in appetite - - - - - - -  Feeling bad or failure about yourself  - - - - - - -  Trouble concentrating - - - - - - -  Moving slowly or fidgety/restless - - - - - - -  Suicidal thoughts - - - - - - -  PHQ-9 Score - - - - - - -  Difficult doing work/chores - - - - - - -   Review of Systems  Musculoskeletal: Positive for back pain and gait problem. Negative for arthralgias.  All other systems reviewed and are negative.      Objective:   Physical Exam Vitals and nursing note reviewed.  Constitutional:      Appearance: Normal appearance.  Cardiovascular:     Rate and Rhythm: Normal rate and regular rhythm.     Pulses: Normal pulses.     Heart sounds: Normal heart sounds.  Pulmonary:     Effort: Pulmonary effort is normal.     Breath sounds: Normal breath sounds.  Musculoskeletal:      Cervical back: Normal range of motion and neck supple.     Comments: Normal Muscle Bulk and Muscle Testing Reveals:  Upper Extremities: Full ROM and Muscle Strength 5/5 Lumbar Paraspinal Tenderness: L-3-L-5 Left Greater Trochanter tenderness Lower  Extremities: Right: Full ROM and Muscle Strength 5/5 Left Lower Extremity: Decreased ROM and Muscle Strength 5/5 Left Lower Extremity with swelling noted and Pain with  palpation  Arises from Table slowly using cane for support Antalgic Gait   Skin:    General: Skin is warm and dry.  Neurological:     Mental Status: She is alert and oriented to person, place, and time.  Psychiatric:        Mood and Affect: Mood normal.        Behavior: Behavior normal.           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 Status post decompressive laminectomy:Chronic Low Back Pain without Sciatica:Continue to MonitorRefilled:Oxycodone 10mg /325 mg one tablet five times a day as needed#135.02/23/2020 We will continue the opioid monitoring program, this consists of regular clinic visits, examinations, urine drug screen, pill counts as well as use of [place] Controlled Substance Reporting system. A 12 month History has been reviewed on the [place] Controlled Substance Reporting Systemon 02/23/2020. 2. Insomnia/ Anxiety:ContinueXanax to 0.5 mg at HS, educated on the [employer], she verbalizes understanding.02/23/2020 3. Lumbar Radiculopathy/Neuropathic pain: Continuecurrent medication regimen withGabapentin.02/23/2020. 4. Muscle Spasms. Continuecurrent medication regimen with: Tizanidine.02/23/2020 5. Neurogenic bowel and bladder.Continue to monitor.02/23/2020 6. Constipation/Opioid Therapy: Continuecurrent medication regimen with Linzess. 02/23/2020 7. Depression: Continuecurrent medication regimen withCelexa.02/23/2020 8.LeftGreater Trochanteric  Bursitis:Continue Heat/Ice Therapy.02/23/2020 9. Chronic Pain Syndrome: Continuecurrent medication regimen withVoltaren.02/23/2020 10. Chronic Right Knee Pain:No complaints today.Continue HEP as Tolerated.Continue current medication regimenand continue to monitor.02/23/2020 11. Cervicalgia/ Cervical Radiculitis:No complaints today.Continue current medication regimenand HEP as tolerated.. Continue to Monitor.02/23/2020 12. Left Lower Extremity Pain/ Swelling: RX: Left Lower Extremity Doppler. Ms. Bohne was asked to call her PCP regarding the above, she verbalizes understanding.   F/U in 1 month

## 2020-02-26 ENCOUNTER — Other Ambulatory Visit: Payer: Self-pay

## 2020-02-26 ENCOUNTER — Ambulatory Visit (HOSPITAL_COMMUNITY)
Admission: RE | Admit: 2020-02-26 | Discharge: 2020-02-26 | Disposition: A | Discharge status: Home / Self Care | Payer: 59 | Source: Ambulatory Visit | Attending: Registered Nurse | Admitting: Registered Nurse

## 2020-02-26 DIAGNOSIS — M79605 Pain in left leg: Secondary | ICD-10-CM | POA: Insufficient documentation

## 2020-02-27 ENCOUNTER — Telehealth: Payer: Self-pay | Admitting: Registered Nurse

## 2020-02-27 NOTE — Telephone Encounter (Signed)
Call placed to [name], we reviewed the Vascular doppler. She verbalizes understanding.

## 2020-04-05 ENCOUNTER — Encounter: Payer: 59 | Attending: Physical Medicine and Rehabilitation | Admitting: Registered Nurse

## 2020-04-05 ENCOUNTER — Other Ambulatory Visit: Payer: Self-pay

## 2020-04-05 ENCOUNTER — Encounter: Payer: Self-pay | Admitting: Registered Nurse

## 2020-04-05 VITALS — BP 122/76 | HR 106 | Temp 98.2°F | Ht 65.0 in | Wt 267.0 lb

## 2020-04-05 DIAGNOSIS — M7061 Trochanteric bursitis, right hip: Secondary | ICD-10-CM | POA: Insufficient documentation

## 2020-04-05 DIAGNOSIS — Z79891 Long term (current) use of opiate analgesic: Secondary | ICD-10-CM | POA: Insufficient documentation

## 2020-04-05 DIAGNOSIS — F411 Generalized anxiety disorder: Secondary | ICD-10-CM | POA: Insufficient documentation

## 2020-04-05 DIAGNOSIS — M7062 Trochanteric bursitis, left hip: Secondary | ICD-10-CM | POA: Insufficient documentation

## 2020-04-05 DIAGNOSIS — Z5181 Encounter for therapeutic drug level monitoring: Secondary | ICD-10-CM | POA: Diagnosis not present

## 2020-04-05 DIAGNOSIS — M5416 Radiculopathy, lumbar region: Secondary | ICD-10-CM | POA: Diagnosis present

## 2020-04-05 DIAGNOSIS — F5101 Primary insomnia: Secondary | ICD-10-CM | POA: Insufficient documentation

## 2020-04-05 DIAGNOSIS — M62830 Muscle spasm of back: Secondary | ICD-10-CM | POA: Diagnosis present

## 2020-04-05 DIAGNOSIS — G894 Chronic pain syndrome: Secondary | ICD-10-CM | POA: Insufficient documentation

## 2020-04-05 DIAGNOSIS — G834 Cauda equina syndrome: Secondary | ICD-10-CM | POA: Insufficient documentation

## 2020-04-05 MED ORDER — ALPRAZOLAM 0.5 MG PO TABS: 0.5000 mg | ORAL_TABLET | Freq: Every day | ORAL | 2 refills | Status: DC | PRN

## 2020-04-05 MED ORDER — OXYCODONE-ACETAMINOPHEN 10-325 MG PO TABS
ORAL_TABLET | ORAL | 0 refills | Status: DC
Start: 2020-04-05 — End: 2020-05-21

## 2020-04-05 NOTE — Progress Notes (Signed)
Subjective:    Patient ID: [name], female    DOB: 04/07/1970, 50 y.o.   MRN: [id number]  HPI: [name] is a 51 y.o. female who returns for follow up appointment for chronic pain and medication refill. She states her pain is located in her Lower back radiating into her left hip and right knee pain. She  rates her pain 10. Her current exercise regime is walking with her cane.   [name] . Morphine equivalent is 75.00  MME.  She is also prescribed Alprazolam. .We have discussed the black box warning of using opioids and benzodiazepines. I highlighted the dangers of using these drugs together and discussed the adverse events including respiratory suppression, overdose, cognitive impairment and importance of compliance with current regimen. We will continue to monitor and adjust as indicated.    UDS was ordered today.    Pain Inventory Average Pain 9 Pain Right Now 10 My pain is sharp, burning, dull, stabbing, tingling and aching  In the last 24 hours, has pain interfered with the following? General activity 10 Relation with others 10 Enjoyment of life 10 What TIME of day is your pain at its worst? morning , daytime, evening and night Sleep (in general) Poor  Pain is worse with: walking, bending, sitting, standing and some activites Pain improves with: rest, heat/ice and medication Relief from Meds: 3  Family History  Problem Relation Age of Onset  . Hypertension Maternal Grandmother   . Anesthesia problems Neg Hx    Social History   Socioeconomic History  . Marital status: Divorced    Spouse name: Not on file  . Number of children: Not on file  . Years of education: Not on file  . Highest education level: Not on file  Occupational History  . Not on file  Tobacco Use  . Smoking status: Never Smoker  . Smokeless tobacco: Never Used  Vaping Use  . Vaping Use: Never used  Substance and Sexual Activity  . Alcohol use: No  . Drug use: No  . Sexual activity:  Not Currently    Birth control/protection: None    Comment: IUD removed 2 wks ago.  Other Topics Concern  . Not on file  Social History Narrative  . Not on file   Social Determinants of Health   Financial Resource Strain: Not on file  Food Insecurity: Not on file  Transportation Needs: Not on file  Physical Activity: Not on file  Stress: Not on file  Social Connections: Not on file   Past Surgical History:  Procedure Laterality Date  . ABDOMINAL HYSTERECTOMY  06/29/11   Robotic Assisted Hysterectomy  . CERVICAL SPINE SURGERY  [place]  . LUMBAR LAMINECTOMY  08/31/2011   Procedure: MICRODISCECTOMY LUMBAR LAMINECTOMY;  Surgeon: Tobi Bastos, MD;  [facility];  Service: Orthopedics;  Laterality: N/A;  lumbar five sacral one central microdisectomy  . MYOMECTOMY  13  . NECK SURGERY    . POSTERIOR CERVICAL FUSION/FORAMINOTOMY N/A 02/02/2013   Procedure: POSTERIOR C5-6 SPINAL FUSION;  Surgeon: Melina Schools, MD;  Location: [place];  Service: Orthopedics;  Laterality: N/A;  . POSTERIOR FUSION CERVICAL SPINE  02/02/2013   C 5 C6     [name]  . WISDOM TOOTH EXTRACTION     Past Surgical History:  Procedure Laterality Date  . ABDOMINAL HYSTERECTOMY  06/29/11   Robotic Assisted Hysterectomy  . CERVICAL SPINE SURGERY  [place]  .  LUMBAR LAMINECTOMY  08/31/2011   Procedure: MICRODISCECTOMY LUMBAR LAMINECTOMY;  Surgeon: Tobi Bastos, MD;  [facility];  Service: Orthopedics;  Laterality: N/A;  lumbar five sacral one central microdisectomy  . MYOMECTOMY  13  . NECK SURGERY    . POSTERIOR CERVICAL FUSION/FORAMINOTOMY N/A 02/02/2013   Procedure: POSTERIOR C5-6 SPINAL FUSION;  Surgeon: Melina Schools, MD;  Location: [place];  Service: Orthopedics;  Laterality: N/A;  . POSTERIOR FUSION CERVICAL SPINE  02/02/2013   C 5 C6     [name]  . WISDOM TOOTH EXTRACTION     Past Medical History:  Diagnosis Date  . Anemia    hx  . Arthritis   .  Complication of anesthesia    slow to wake up,   very difficult to get iv had picc line last time  . Difficult intravenous access    hands are usually best for blood draws and IV starts  . DVT (deep venous thrombosis) ([place])    ? vs lupus  . Fibroids   . Headache(784.0)   . Neck pain   . Scoliosis    BP 122/76   Pulse (!) 106   Temp 98.2 F (36.8 C)   Ht 5\' 5"  (1.651 m)   Wt 267 lb (121.1 kg)   LMP 04/01/2011   SpO2 (!) 9%   BMI 44.43 kg/m   Opioid Risk Score:   Fall Risk Score:  `1  Depression screen PHQ 2/9  Depression screen [facility] 2/9 02/23/2020 01/23/2020 08/24/2018 06/29/2018 05/03/2018 03/07/2018 01/03/2018  Decreased Interest 0 0 0 0 0 0 0  Down, Depressed, Hopeless 0 0 0 0 0 0 0  PHQ - 2 Score 0 0 0 0 0 0 0  Altered sleeping - - - - - - -  Tired, decreased energy - - - - - - -  Change in appetite - - - - - - -  Feeling bad or failure about yourself  - - - - - - -  Trouble concentrating - - - - - - -  Moving slowly or fidgety/restless - - - - - - -  Suicidal thoughts - - - - - - -  PHQ-9 Score - - - - - - -  Difficult doing work/chores - - - - - - -     Review of Systems  Constitutional: Negative.   HENT: Negative.   Eyes: Negative.   Respiratory: Negative.   Cardiovascular: Negative.   Gastrointestinal: Negative.   Endocrine: Negative.   Genitourinary: Negative.   Musculoskeletal: Positive for arthralgias and back pain.  Skin: Negative.   Allergic/Immunologic: Negative.   Neurological: Negative.   Hematological: Negative.   Psychiatric/Behavioral: Negative.   All other systems reviewed and are negative.      Objective:   Physical Exam Vitals and nursing note reviewed.  Constitutional:      Appearance: Normal appearance. She is obese.  Cardiovascular:     Rate and Rhythm: Normal rate and regular rhythm.     Pulses: Normal pulses.     Heart sounds: Normal heart sounds.  Pulmonary:     Effort: Pulmonary effort is normal.     Breath sounds: Normal  breath sounds.  Musculoskeletal:     Cervical back: Normal range of motion and neck supple.     Comments: Normal Muscle Bulk and Muscle Testing Reveals:  Upper Extremities: Full ROM and Muscle Strength 5/5  Lumbar Paraspinal Tenderness: L-3-L-5 Left Greater Trochanter Tenderness Lower Extremities Right: Decreased  ROM and Muscle Strength 4/5 Right Lower Extremity Flexion Produces Pain into her Right Patella Left Lower Extremity: Full ROM and Muscle Strength 5/5 Arises from Table slowly using cane for support Antalgic Gait   Skin:    General: Skin is warm and dry.  Neurological:     Mental Status: She is alert and oriented to person, place, and time.  Psychiatric:        Mood and Affect: Mood normal.        Behavior: Behavior normal.           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 Status post decompressive laminectomy:Chronic Low Back Pain without Sciatica:Continue to MonitorRefilled:Oxycodone 10mg /325 mg one tablet five times a day as needed#135.04/05/2020 We will continue the opioid monitoring program, this consists of regular clinic visits, examinations, urine drug screen, pill counts as well as use of [place] Controlled Substance Reporting system. A 12 month History has been reviewed on the [place] Controlled Substance Reporting Systemon01/14/2022. 2. Insomnia/ Anxiety:ContinueXanax to 0.5 mg at HS, educated on the [employer], she verbalizes understanding.04/05/2020 3. Lumbar Radiculopathy/Neuropathic pain: Continuecurrent medication regimen withGabapentin.04/05/2020 4. Muscle Spasms. Continuecurrent medication regimen with: Tizanidine.04/05/2020 5. Neurogenic bowel and bladder.Continue to monitor.04/05/2020 6. Constipation/Opioid Therapy: Continuecurrent medication regimen with Linzess. 04/05/2020 7. Depression: Continuecurrent medication regimen withCelexa.Continue to monitor.  04/05/2020 8.LeftGreater Trochanteric Bursitis:Continue Heat/Ice Therapy.04/05/2020 9. Chronic Pain Syndrome: Continuecurrent medication regimen withVoltaren.04/05/2020 10. Chronic Right Knee Pain:Continue HEP as Tolerated.Continue current medication regimenand continue to monitor.04/05/2020 11. Cervicalgia/ Cervical Radiculitis:No complaints today.Continue current medication regimenand HEP as tolerated.. Continue to Monitor.04/05/2020  F/U in 1 month

## 2020-04-12 LAB — TOXASSURE SELECT,+ANTIDEPR,UR

## 2020-04-15 ENCOUNTER — Telehealth: Payer: Self-pay | Admitting: *Deleted

## 2020-04-15 NOTE — Telephone Encounter (Signed)
Urine drug screen for this encounter is consistent for prescribed medication. There are no metabolites present which is atypical if taking consistently, parent drug present only.

## 2020-05-13 ENCOUNTER — Encounter: Payer: 59 | Admitting: Registered Nurse

## 2020-05-21 ENCOUNTER — Other Ambulatory Visit: Payer: Self-pay | Admitting: Internal Medicine

## 2020-05-21 ENCOUNTER — Encounter: Payer: 59 | Attending: Physical Medicine and Rehabilitation | Admitting: Registered Nurse

## 2020-05-21 ENCOUNTER — Encounter: Payer: Self-pay | Admitting: Registered Nurse

## 2020-05-21 ENCOUNTER — Other Ambulatory Visit: Payer: Self-pay

## 2020-05-21 VITALS — Ht 65.0 in | Wt 253.0 lb

## 2020-05-21 DIAGNOSIS — Z79891 Long term (current) use of opiate analgesic: Secondary | ICD-10-CM

## 2020-05-21 DIAGNOSIS — M5416 Radiculopathy, lumbar region: Secondary | ICD-10-CM

## 2020-05-21 DIAGNOSIS — M7062 Trochanteric bursitis, left hip: Secondary | ICD-10-CM | POA: Diagnosis not present

## 2020-05-21 DIAGNOSIS — G834 Cauda equina syndrome: Secondary | ICD-10-CM

## 2020-05-21 DIAGNOSIS — F411 Generalized anxiety disorder: Secondary | ICD-10-CM

## 2020-05-21 DIAGNOSIS — M62830 Muscle spasm of back: Secondary | ICD-10-CM

## 2020-05-21 DIAGNOSIS — G894 Chronic pain syndrome: Secondary | ICD-10-CM

## 2020-05-21 DIAGNOSIS — F5101 Primary insomnia: Secondary | ICD-10-CM | POA: Diagnosis not present

## 2020-05-21 DIAGNOSIS — Z5181 Encounter for therapeutic drug level monitoring: Secondary | ICD-10-CM

## 2020-05-21 DIAGNOSIS — Z1231 Encounter for screening mammogram for malignant neoplasm of breast: Secondary | ICD-10-CM

## 2020-05-21 DIAGNOSIS — M7061 Trochanteric bursitis, right hip: Secondary | ICD-10-CM

## 2020-05-21 MED ORDER — ALPRAZOLAM 0.5 MG PO TABS: 0.5000 mg | ORAL_TABLET | Freq: Every day | ORAL | 2 refills | Status: DC | PRN

## 2020-05-21 MED ORDER — GABAPENTIN 300 MG PO CAPS: ORAL_CAPSULE | ORAL | 3 refills | Status: DC

## 2020-05-21 MED ORDER — TIZANIDINE HCL 4 MG PO TABS: ORAL_TABLET | ORAL | 3 refills | Status: DC

## 2020-05-21 MED ORDER — OXYCODONE-ACETAMINOPHEN 10-325 MG PO TABS
ORAL_TABLET | ORAL | 0 refills | Status: DC
Start: 2020-05-21 — End: 2020-06-25

## 2020-05-21 MED ORDER — DICLOFENAC SODIUM 75 MG PO TBEC
75.0000 mg | DELAYED_RELEASE_TABLET | Freq: Two times a day (BID) | ORAL | 3 refills | Status: DC
Start: 2020-05-21 — End: 2020-08-21

## 2020-05-21 MED ORDER — CITALOPRAM HYDROBROMIDE 10 MG PO TABS: 10.0000 mg | ORAL_TABLET | Freq: Every day | ORAL | 3 refills | Status: DC

## 2020-05-21 NOTE — Progress Notes (Addendum)
Subjective:    Patient ID: [name], female    DOB: 11/29/1970, 50 y.o.   MRN: [id number]  HPI: [name] is a 50 y.o. female whose appointment was changed to a My-Chart Video Visit, she reports she has a headache, cough and chills, she seen her PCP. She was scheduled for COVID- 19 testing this morning. [name] agrees with My-Chart Video visit and verbalizes understanding. She states her pain is located in her lower back radiating into her left hips. Also reports generalized pain. She rates her  Pain 10. Her  current exercise regime is walking   [name] Morphine equivalent is 75.00 MME. She is also prescribed alprazolam. We have discussed the black box warning of using opioids and benzodiazepines. I highlighted the dangers of using these drugs together and discussed the adverse events including respiratory suppression, overdose, cognitive impairment and importance of compliance with current regimen. We will continue to monitor and adjust as indicated.    Last UDS was Performed on 04/05/2020, it was consistent.   Geryl Rankins CMA asked The Health and History Questions. This provider and Mancel Parsons verified we were speaking with the correct person using two identifiers.    Pain Inventory Average Pain 8 Pain Right Now 10 My pain is constant, sharp, dull, stabbing, tingling and aching  In the last 24 hours, has pain interfered with the following? General activity 8 Relation with others 8 Enjoyment of life 8 What TIME of day is your pain at its worst? morning , daytime, evening and night Sleep (in general) Poor  Pain is worse with: walking, bending, sitting, standing and some activites Pain improves with: rest, heat/ice, therapy/exercise and medication Relief from Meds: 3  Family History  Problem Relation Age of Onset  . Hypertension Maternal Grandmother   . Anesthesia problems Neg Hx    Social History   Socioeconomic History  . Marital status: Divorced     Spouse name: Not on file  . Number of children: Not on file  . Years of education: Not on file  . Highest education level: Not on file  Occupational History  . Not on file  Tobacco Use  . Smoking status: Never Smoker  . Smokeless tobacco: Never Used  Vaping Use  . Vaping Use: Never used  Substance and Sexual Activity  . Alcohol use: No  . Drug use: No  . Sexual activity: Not Currently    Birth control/protection: None    Comment: IUD removed 2 wks ago.  Other Topics Concern  . Not on file  Social History Narrative  . Not on file   Social Determinants of Health   Financial Resource Strain: Not on file  Food Insecurity: Not on file  Transportation Needs: Not on file  Physical Activity: Not on file  Stress: Not on file  Social Connections: Not on file   Past Surgical History:  Procedure Laterality Date  . ABDOMINAL HYSTERECTOMY  06/29/11   Robotic Assisted Hysterectomy  . CERVICAL SPINE SURGERY  [place]  . LUMBAR LAMINECTOMY  08/31/2011   Procedure: MICRODISCECTOMY LUMBAR LAMINECTOMY;  Surgeon: Tobi Bastos, MD;  [facility];  Service: Orthopedics;  Laterality: N/A;  lumbar five sacral one central microdisectomy  . MYOMECTOMY  13  . NECK SURGERY    . POSTERIOR CERVICAL FUSION/FORAMINOTOMY N/A 02/02/2013   Procedure: POSTERIOR C5-6 SPINAL FUSION;  Surgeon: Melina Schools, MD;  Location: [place];  Service: Orthopedics;  Laterality: N/A;  .  POSTERIOR FUSION CERVICAL SPINE  02/02/2013   C 5 C6     [name]  . WISDOM TOOTH EXTRACTION     Past Surgical History:  Procedure Laterality Date  . ABDOMINAL HYSTERECTOMY  06/29/11   Robotic Assisted Hysterectomy  . CERVICAL SPINE SURGERY  [place]  . LUMBAR LAMINECTOMY  08/31/2011   Procedure: MICRODISCECTOMY LUMBAR LAMINECTOMY;  Surgeon: Tobi Bastos, MD;  [facility];  Service: Orthopedics;  Laterality: N/A;  lumbar five sacral one central microdisectomy  . MYOMECTOMY  13  .  NECK SURGERY    . POSTERIOR CERVICAL FUSION/FORAMINOTOMY N/A 02/02/2013   Procedure: POSTERIOR C5-6 SPINAL FUSION;  Surgeon: Melina Schools, MD;  Location: [place];  Service: Orthopedics;  Laterality: N/A;  . POSTERIOR FUSION CERVICAL SPINE  02/02/2013   C 5 C6     [name]  . WISDOM TOOTH EXTRACTION     Past Medical History:  Diagnosis Date  . Anemia    hx  . Arthritis   . Complication of anesthesia    slow to wake up,   very difficult to get iv had picc line last time  . Difficult intravenous access    hands are usually best for blood draws and IV starts  . DVT (deep venous thrombosis) ([place])    ? vs lupus  . Fibroids   . Headache(784.0)   . Neck pain   . Scoliosis    Ht 5\' 5"  (1.651 m)   Wt 253 lb (114.8 kg)   LMP 04/01/2011   BMI 42.10 kg/m   Opioid Risk Score:   Fall Risk Score:  `1  Depression screen PHQ 2/9  Depression screen [facility] 2/9 02/23/2020 01/23/2020 08/24/2018 06/29/2018 05/03/2018 03/07/2018 01/03/2018  Decreased Interest 0 0 0 0 0 0 0  Down, Depressed, Hopeless 0 0 0 0 0 0 0  PHQ - 2 Score 0 0 0 0 0 0 0  Altered sleeping - - - - - - -  Tired, decreased energy - - - - - - -  Change in appetite - - - - - - -  Feeling bad or failure about yourself  - - - - - - -  Trouble concentrating - - - - - - -  Moving slowly or fidgety/restless - - - - - - -  Suicidal thoughts - - - - - - -  PHQ-9 Score - - - - - - -  Difficult doing work/chores - - - - - - -    Review of Systems  Constitutional: Negative.   HENT: Negative.   Eyes: Negative.   Respiratory: Negative.   Gastrointestinal: Negative.   Endocrine: Negative.   Genitourinary: Negative.   Musculoskeletal: Positive for arthralgias, back pain and gait problem.  Skin: Negative.   Allergic/Immunologic: Negative.   Neurological: Positive for weakness.       Tingling  Hematological: Negative.   Psychiatric/Behavioral: Negative.   All other systems reviewed and are negative.      Objective:   Physical  Exam Vitals and nursing note reviewed.  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 Status post decompressive laminectomy:Chronic Low Back Pain without Sciatica:Continue to MonitorRefilled:Oxycodone 10mg /325 mg one tablet five times a day as needed#135.05/21/2020 We will continue the opioid monitoring program, this consists of regular  clinic visits, examinations, urine drug screen, pill counts as well as use of [place] Controlled Substance Reporting system. A 12 month History has been reviewed on the [place] Controlled Substance Reporting Systemon03/03/2020. 2. Insomnia/ Anxiety:ContinueXanax to 0.5 mg at HS, educated on the [employer], she verbalizes understanding.05/21/2020 3. Lumbar Radiculopathy/Neuropathic pain: Continuecurrent medication regimen withGabapentin.04/05/2020 4. Muscle Spasms. Continuecurrent medication regimen with: Tizanidine.05/21/2020 5. Neurogenic bowel and bladder.Continue to monitor.05/21/2020 6. Constipation/Opioid Therapy: Continuecurrent medication regimen with Linzess. 05/21/2020 7. Depression: Continuecurrent medication regimen withCelexa.Continue to monitor. 05/21/2020 8.LeftGreater Trochanteric Bursitis:Continue Heat/Ice Therapy.05/21/2020 9. Chronic Pain Syndrome: Continuecurrent medication regimen withVoltaren.05/21/2020 10. Chronic Right Knee Pain:No complaints today. Continue HEP as Tolerated.Continue current medication regimenand continue to monitor.05/21/2020 11. Cervicalgia/ Cervical Radiculitis:No complaints today.Continue current medication regimenand HEP as tolerated.. Continue to Monitor.05/21/2020  F/U in 1 month  My-Chart Video Visit Established Patient Location of Patient: In Her Car: Coming from having COVID-19 Testing Location of  Provider In the Office  Supervising Physician: Dr Naaman Plummer

## 2020-06-25 ENCOUNTER — Other Ambulatory Visit: Payer: Self-pay

## 2020-06-25 ENCOUNTER — Encounter: Payer: 59 | Attending: Physical Medicine and Rehabilitation | Admitting: Registered Nurse

## 2020-06-25 ENCOUNTER — Encounter: Payer: Self-pay | Admitting: Registered Nurse

## 2020-06-25 VITALS — BP 136/83 | HR 89 | Temp 98.1°F | Ht 65.0 in | Wt 266.8 lb

## 2020-06-25 DIAGNOSIS — M5416 Radiculopathy, lumbar region: Secondary | ICD-10-CM

## 2020-06-25 DIAGNOSIS — G834 Cauda equina syndrome: Secondary | ICD-10-CM | POA: Diagnosis present

## 2020-06-25 DIAGNOSIS — Z79891 Long term (current) use of opiate analgesic: Secondary | ICD-10-CM | POA: Diagnosis present

## 2020-06-25 DIAGNOSIS — M7061 Trochanteric bursitis, right hip: Secondary | ICD-10-CM | POA: Diagnosis present

## 2020-06-25 DIAGNOSIS — G894 Chronic pain syndrome: Secondary | ICD-10-CM | POA: Diagnosis present

## 2020-06-25 DIAGNOSIS — M62830 Muscle spasm of back: Secondary | ICD-10-CM | POA: Diagnosis present

## 2020-06-25 DIAGNOSIS — F411 Generalized anxiety disorder: Secondary | ICD-10-CM

## 2020-06-25 DIAGNOSIS — Z5181 Encounter for therapeutic drug level monitoring: Secondary | ICD-10-CM

## 2020-06-25 DIAGNOSIS — M7062 Trochanteric bursitis, left hip: Secondary | ICD-10-CM | POA: Diagnosis not present

## 2020-06-25 DIAGNOSIS — F5101 Primary insomnia: Secondary | ICD-10-CM | POA: Diagnosis present

## 2020-06-25 MED ORDER — OXYCODONE-ACETAMINOPHEN 10-325 MG PO TABS: ORAL_TABLET | ORAL | 0 refills | Status: DC

## 2020-06-25 NOTE — Progress Notes (Signed)
Subjective:    Patient ID: [name], female    DOB: 1970-09-12, 50 y.o.   MRN: [id number]  HPI: [name] is a 50 y.o. female who returns for follow up appointment for chronic pain and medication refill. She states her pain is located in her  Lower back radiating into her left hip. Also reports bilateral hip pain. She rates her pain 9. Her current exercise regime is walking and performing stretching exercises.  [name] Morphine equivalent is 60.00 MME.    Last UDS was Performed on 04/05/2020, it was consistent.     Pain Inventory Average Pain 9 Pain Right Now 9 My pain is constant, sharp, dull, stabbing, tingling and aching  In the last 24 hours, has pain interfered with the following? General activity 9 Relation with others 9 Enjoyment of life 9 What TIME of day is your pain at its worst? morning , daytime, evening and night Sleep (in general) Poor  Pain is worse with: walking, bending, sitting, standing and some activites Pain improves with: rest, heat/ice, therapy/exercise and medication Relief from Meds: 3  Family History  Problem Relation Age of Onset  . Hypertension Maternal Grandmother   . Anesthesia problems Neg Hx    Social History   Socioeconomic History  . Marital status: Divorced    Spouse name: Not on file  . Number of children: Not on file  . Years of education: Not on file  . Highest education level: Not on file  Occupational History  . Not on file  Tobacco Use  . Smoking status: Never Smoker  . Smokeless tobacco: Never Used  Vaping Use  . Vaping Use: Never used  Substance and Sexual Activity  . Alcohol use: No  . Drug use: No  . Sexual activity: Not Currently    Birth control/protection: None    Comment: IUD removed 2 wks ago.  Other Topics Concern  . Not on file  Social History Narrative  . Not on file   Social Determinants of Health   Financial Resource Strain: Not on file  Food Insecurity: Not on file  Transportation  Needs: Not on file  Physical Activity: Not on file  Stress: Not on file  Social Connections: Not on file   Past Surgical History:  Procedure Laterality Date  . ABDOMINAL HYSTERECTOMY  06/29/11   Robotic Assisted Hysterectomy  . CERVICAL SPINE SURGERY  [place]  . LUMBAR LAMINECTOMY  08/31/2011   Procedure: MICRODISCECTOMY LUMBAR LAMINECTOMY;  Surgeon: Tobi Bastos, MD;  [facility];  Service: Orthopedics;  Laterality: N/A;  lumbar five sacral one central microdisectomy  . MYOMECTOMY  13  . NECK SURGERY    . POSTERIOR CERVICAL FUSION/FORAMINOTOMY N/A 02/02/2013   Procedure: POSTERIOR C5-6 SPINAL FUSION;  Surgeon: Melina Schools, MD;  Location: [place];  Service: Orthopedics;  Laterality: N/A;  . POSTERIOR FUSION CERVICAL SPINE  02/02/2013   C 5 C6     [name]  . WISDOM TOOTH EXTRACTION     Past Surgical History:  Procedure Laterality Date  . ABDOMINAL HYSTERECTOMY  06/29/11   Robotic Assisted Hysterectomy  . CERVICAL SPINE SURGERY  [place]  . LUMBAR LAMINECTOMY  08/31/2011   Procedure: MICRODISCECTOMY LUMBAR LAMINECTOMY;  Surgeon: Tobi Bastos, MD;  [facility];  Service: Orthopedics;  Laterality: N/A;  lumbar five sacral one central microdisectomy  . MYOMECTOMY  13  . NECK SURGERY    .  POSTERIOR CERVICAL FUSION/FORAMINOTOMY N/A 02/02/2013   Procedure: POSTERIOR C5-6 SPINAL FUSION;  Surgeon: Melina Schools, MD;  Location: [place];  Service: Orthopedics;  Laterality: N/A;  . POSTERIOR FUSION CERVICAL SPINE  02/02/2013   C 5 C6     [name]  . WISDOM TOOTH EXTRACTION     Past Medical History:  Diagnosis Date  . Anemia    hx  . Arthritis   . Complication of anesthesia    slow to wake up,   very difficult to get iv had picc line last time  . Difficult intravenous access    hands are usually best for blood draws and IV starts  . DVT (deep venous thrombosis) ([place])    ? vs lupus  . Fibroids   . Headache(784.0)   . Neck pain    . Scoliosis    BP (!) 141/103   Pulse 98   Temp 98.1 F (36.7 C)   Ht 5\' 5"  (1.651 m)   Wt 266 lb 12.8 oz (121 kg)   LMP 04/01/2011   SpO2 97%   BMI 44.40 kg/m   Opioid Risk Score:   Fall Risk Score:  `1  Depression screen PHQ 2/9  Depression screen [facility] 2/9 02/23/2020 01/23/2020 08/24/2018 06/29/2018 05/03/2018 03/07/2018 01/03/2018  Decreased Interest 0 0 0 0 0 0 0  Down, Depressed, Hopeless 0 0 0 0 0 0 0  PHQ - 2 Score 0 0 0 0 0 0 0  Altered sleeping - - - - - - -  Tired, decreased energy - - - - - - -  Change in appetite - - - - - - -  Feeling bad or failure about yourself  - - - - - - -  Trouble concentrating - - - - - - -  Moving slowly or fidgety/restless - - - - - - -  Suicidal thoughts - - - - - - -  PHQ-9 Score - - - - - - -  Difficult doing work/chores - - - - - - -     Review of Systems  Musculoskeletal: Positive for arthralgias, back pain and gait problem.  Neurological: Positive for weakness.       Tingling  All other systems reviewed and are negative.      Objective:   Physical Exam Vitals and nursing note reviewed.  Constitutional:      Appearance: Normal appearance.  Cardiovascular:     Rate and Rhythm: Normal rate and regular rhythm.     Pulses: Normal pulses.     Heart sounds: Normal heart sounds.  Pulmonary:     Effort: Pulmonary effort is normal.     Breath sounds: Normal breath sounds.  Musculoskeletal:     Cervical back: Normal range of motion and neck supple.     Comments: Normal Muscle Bulk and Muscle Testing Reveals:  Upper Extremities: Full ROM and Muscle Strength 5/5 Lumbar Paraspinal Tenderness: L-3-L-5 Bilateral Greater Trochanter Tenderness: L>R Lower Extremities: Right Lower Extremity: Decreased ROM and Muscle Strength 5/5 Right Lower Extremity Flexion Produces Pain into her Right Patella Left Lower Extremity: Full ROM and Muscle Strength 5/5 Arises From Table slowly Antalgic  Gait   Skin:    General: Skin is warm and dry.   Neurological:     Mental Status: She is alert and oriented to person, place, and time.  Psychiatric:        Mood and Affect: Mood normal.        Behavior: Behavior  normal.           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 Status post decompressive laminectomy:Chronic Low Back Pain without Sciatica:Continue to MonitorRefilled:Oxycodone 10mg /325 mg one tablet five times a day as needed#135.06/25/2020 We will continue the opioid monitoring program, this consists of regular clinic visits, examinations, urine drug screen, pill counts as well as use of [place] Controlled Substance Reporting system. A 12 month History has been reviewed on the [place] Controlled Substance Reporting Systemon04/07/2020. 2. Insomnia/ Anxiety:ContinueXanax to 0.5 mg at HS, educated on the [employer], she verbalizes understanding.06/25/2020 3. Lumbar Radiculopathy/Neuropathic pain: Continuecurrent medication regimen withGabapentin.06/25/2020 4. Muscle Spasms. Continuecurrent medication regimen with: Tizanidine.06/25/2020 5. Neurogenic bowel and bladder.Continue to monitor.06/25/2020 6. Constipation/Opioid Therapy: Continuecurrent medication regimen with Linzess. 06/25/2020 7. Depression: Continuecurrent medication regimen withCelexa.Continue to monitor. 06/25/2020 8.LeftGreater Trochanteric Bursitis:Continue Heat/Ice Therapy.06/25/2020 9. Chronic Pain Syndrome: Continuecurrent medication regimen withVoltaren.06/25/2020 10. Chronic Right Knee Pain: Continue HEP as Tolerated.Continue current medication regimenand continue to monitor.06/25/2020 11. Cervicalgia/ Cervical Radiculitis:No complaints today.Continue current medication regimenand HEP as tolerated.. Continue to Monitor.06/25/2020  F/U in 1 month

## 2020-07-24 ENCOUNTER — Other Ambulatory Visit: Payer: Self-pay

## 2020-07-24 ENCOUNTER — Encounter: Payer: Self-pay | Admitting: Registered Nurse

## 2020-07-24 ENCOUNTER — Encounter: Payer: 59 | Attending: Physical Medicine and Rehabilitation | Admitting: Registered Nurse

## 2020-07-24 VITALS — BP 119/80 | HR 94 | Temp 98.2°F | Ht 65.0 in | Wt 264.0 lb

## 2020-07-24 DIAGNOSIS — F411 Generalized anxiety disorder: Secondary | ICD-10-CM | POA: Diagnosis present

## 2020-07-24 DIAGNOSIS — M7061 Trochanteric bursitis, right hip: Secondary | ICD-10-CM | POA: Diagnosis present

## 2020-07-24 DIAGNOSIS — Z5181 Encounter for therapeutic drug level monitoring: Secondary | ICD-10-CM | POA: Insufficient documentation

## 2020-07-24 DIAGNOSIS — M62830 Muscle spasm of back: Secondary | ICD-10-CM | POA: Diagnosis present

## 2020-07-24 DIAGNOSIS — Z79891 Long term (current) use of opiate analgesic: Secondary | ICD-10-CM | POA: Diagnosis present

## 2020-07-24 DIAGNOSIS — M7062 Trochanteric bursitis, left hip: Secondary | ICD-10-CM | POA: Insufficient documentation

## 2020-07-24 DIAGNOSIS — M5416 Radiculopathy, lumbar region: Secondary | ICD-10-CM | POA: Insufficient documentation

## 2020-07-24 DIAGNOSIS — F3289 Other specified depressive episodes: Secondary | ICD-10-CM | POA: Diagnosis present

## 2020-07-24 DIAGNOSIS — F5101 Primary insomnia: Secondary | ICD-10-CM | POA: Diagnosis present

## 2020-07-24 DIAGNOSIS — G834 Cauda equina syndrome: Secondary | ICD-10-CM | POA: Diagnosis present

## 2020-07-24 DIAGNOSIS — G894 Chronic pain syndrome: Secondary | ICD-10-CM | POA: Diagnosis present

## 2020-07-24 MED ORDER — OXYCODONE-ACETAMINOPHEN 10-325 MG PO TABS: ORAL_TABLET | ORAL | 0 refills | Status: DC

## 2020-07-24 MED ORDER — ALPRAZOLAM 0.5 MG PO TABS
0.5000 mg | ORAL_TABLET | Freq: Every day | ORAL | 2 refills | Status: DC | PRN
Start: 2020-07-24 — End: 2020-09-20

## 2020-07-24 NOTE — Progress Notes (Signed)
Subjective:    Patient ID: [name], female    DOB: 1970/11/15, 50 y.o.   MRN: [id number]  HPI: [name] is a 50 y.o. female who returns for follow up appointment for chronic pain and medication refill. She states her pain is located in her lower back radiating into her left hip. She  rates her pain 8. Her current exercise regime is walking and performing stretching exercises.  [name] Morphine equivalent is 67.50 MME. She is also prescribed Alprazolam. We have discussed the black box warning of using opioids and benzodiazepines. I highlighted the dangers of using these drugs together and discussed the adverse events including respiratory suppression, overdose, cognitive impairment and importance of compliance with current regimen. We will continue to monitor and adjust as indicated.     Last UDS was Performed on 04/05/2020, it was consistent.    Pain Inventory Average Pain 9 Pain Right Now 8 My pain is constant, sharp, burning, dull, tingling and aching  In the last 24 hours, has pain interfered with the following? General activity 8 Relation with others 8 Enjoyment of life 8 What TIME of day is your pain at its worst? morning , daytime, evening and night Sleep (in general) Poor  Pain is worse with: walking, sitting, standing and some activites Pain improves with: medication and heat Relief from Meds: 3  Family History  Problem Relation Age of Onset  . Hypertension Maternal Grandmother   . Anesthesia problems Neg Hx    Social History   Socioeconomic History  . Marital status: Divorced    Spouse name: Not on file  . Number of children: Not on file  . Years of education: Not on file  . Highest education level: Not on file  Occupational History  . Not on file  Tobacco Use  . Smoking status: Never Smoker  . Smokeless tobacco: Never Used  Vaping Use  . Vaping Use: Never used  Substance and Sexual Activity  . Alcohol use: No  . Drug use: No  . Sexual  activity: Not Currently    Birth control/protection: None    Comment: IUD removed 2 wks ago.  Other Topics Concern  . Not on file  Social History Narrative  . Not on file   Social Determinants of Health   Financial Resource Strain: Not on file  Food Insecurity: Not on file  Transportation Needs: Not on file  Physical Activity: Not on file  Stress: Not on file  Social Connections: Not on file   Past Surgical History:  Procedure Laterality Date  . ABDOMINAL HYSTERECTOMY  06/29/11   Robotic Assisted Hysterectomy  . CERVICAL SPINE SURGERY  [place]  . LUMBAR LAMINECTOMY  08/31/2011   Procedure: MICRODISCECTOMY LUMBAR LAMINECTOMY;  Surgeon: Tobi Bastos, MD;  [facility];  Service: Orthopedics;  Laterality: N/A;  lumbar five sacral one central microdisectomy  . MYOMECTOMY  13  . NECK SURGERY    . POSTERIOR CERVICAL FUSION/FORAMINOTOMY N/A 02/02/2013   Procedure: POSTERIOR C5-6 SPINAL FUSION;  Surgeon: Melina Schools, MD;  Location: [place];  Service: Orthopedics;  Laterality: N/A;  . POSTERIOR FUSION CERVICAL SPINE  02/02/2013   C 5 C6     [name]  . WISDOM TOOTH EXTRACTION     Past Surgical History:  Procedure Laterality Date  . ABDOMINAL HYSTERECTOMY  06/29/11   Robotic Assisted Hysterectomy  . CERVICAL SPINE SURGERY  [place]  . LUMBAR  LAMINECTOMY  08/31/2011   Procedure: MICRODISCECTOMY LUMBAR LAMINECTOMY;  Surgeon: Tobi Bastos, MD;  [facility];  Service: Orthopedics;  Laterality: N/A;  lumbar five sacral one central microdisectomy  . MYOMECTOMY  13  . NECK SURGERY    . POSTERIOR CERVICAL FUSION/FORAMINOTOMY N/A 02/02/2013   Procedure: POSTERIOR C5-6 SPINAL FUSION;  Surgeon: Melina Schools, MD;  Location: [place];  Service: Orthopedics;  Laterality: N/A;  . POSTERIOR FUSION CERVICAL SPINE  02/02/2013   C 5 C6     [name]  . WISDOM TOOTH EXTRACTION     Past Medical History:  Diagnosis Date  . Anemia    hx  .  Arthritis   . Complication of anesthesia    slow to wake up,   very difficult to get iv had picc line last time  . Difficult intravenous access    hands are usually best for blood draws and IV starts  . DVT (deep venous thrombosis) ([place])    ? vs lupus  . Fibroids   . Headache(784.0)   . Neck pain   . Scoliosis    LMP 04/01/2011   Opioid Risk Score:   Fall Risk Score:  `1  Depression screen PHQ 2/9  Depression screen [facility] 2/9 02/23/2020 01/23/2020 08/24/2018 06/29/2018 05/03/2018 03/07/2018 01/03/2018  Decreased Interest 0 0 0 0 0 0 0  Down, Depressed, Hopeless 0 0 0 0 0 0 0  PHQ - 2 Score 0 0 0 0 0 0 0  Altered sleeping - - - - - - -  Tired, decreased energy - - - - - - -  Change in appetite - - - - - - -  Feeling bad or failure about yourself  - - - - - - -  Trouble concentrating - - - - - - -  Moving slowly or fidgety/restless - - - - - - -  Suicidal thoughts - - - - - - -  PHQ-9 Score - - - - - - -  Difficult doing work/chores - - - - - - -   Review of Systems  Musculoskeletal: Positive for back pain and gait problem.       Left hip pain  All other systems reviewed and are negative.      Objective:   Physical Exam Vitals and nursing note reviewed.  Constitutional:      Appearance: Normal appearance.  Cardiovascular:     Rate and Rhythm: Normal rate and regular rhythm.     Pulses: Normal pulses.     Heart sounds: Normal heart sounds.  Pulmonary:     Effort: Pulmonary effort is normal.     Breath sounds: Normal breath sounds.  Musculoskeletal:     Cervical back: Normal range of motion and neck supple.     Comments: Normal Muscle Bulk and Muscle Testing Reveals:  Upper Extremities: Full ROM and Muscle Strength 5/5  Lumbar Hypersensitivity Left Greater Trochanter Tenderness Lower Extremities: Full ROM and Muscle Strength 5/5 Arises from Table Slowly using cane for support Antalgic  Gait   Skin:    General: Skin is warm and dry.  Neurological:     Mental Status:  She is alert and oriented to person, place, and time.  Psychiatric:        Mood and Affect: Mood normal.        Behavior: Behavior normal.           Assessment & Plan:  1.Lumbar Post-Laminectomy/Cauda equina syndrome due to extruded  L5-S1 disc. Status post decompressive laminectomy:Chronic Low Back Pain without Sciatica:Continue to MonitorRefilled:Oxycodone 10mg /325 mg one tablet five times a day as needed#135.07/24/2020 We will continue the opioid monitoring program, this consists of regular clinic visits, examinations, urine drug screen, pill counts as well as use of [place] Controlled Substance Reporting system. A 12 month History has been reviewed on the [place] Controlled Substance Reporting Systemon05/06/2020. 2. Insomnia/ Anxiety:ContinueXanax to 0.5 mg at HS, educated on the [employer], she verbalizes understanding.07/24/2020 3. Lumbar Radiculopathy/Neuropathic pain: Continuecurrent medication regimen withGabapentin.07/24/2020 4. Muscle Spasms. Continuecurrent medication regimen with: Tizanidine.07/24/2020 5. Neurogenic bowel and bladder.Continue to monitor.07/24/2020 6. Constipation/Opioid Therapy: Continuecurrent medication regimen with Linzess. 07/24/2020 7. Depression: Continuecurrent medication regimen withCelexa.Continue to monitor. 07/24/2020 8.LeftGreater Trochanteric Bursitis:Continue Heat/Ice Therapy.07/24/2020 9. Chronic Pain Syndrome: Continuecurrent medication regimen withVoltaren.07/24/2020 10. Chronic Right Knee Pain:No complaints today. Continue HEP as Tolerated.Continue current medication regimenand continue to monitor.07/24/2020 11. Cervicalgia/ Cervical Radiculitis:No complaints today.Continue current medication regimenand HEP as tolerated.. Continue to Monitor.07/24/2020  F/U in 1 month

## 2020-07-30 ENCOUNTER — Ambulatory Visit
Admission: RE | Admit: 2020-07-30 | Discharge: 2020-07-30 | Disposition: A | Discharge status: Home / Self Care | Payer: 59 | Source: Ambulatory Visit | Attending: Internal Medicine | Admitting: Internal Medicine

## 2020-07-30 ENCOUNTER — Other Ambulatory Visit: Payer: Self-pay

## 2020-07-30 DIAGNOSIS — Z1231 Encounter for screening mammogram for malignant neoplasm of breast: Secondary | ICD-10-CM

## 2020-08-21 ENCOUNTER — Encounter: Payer: 59 | Attending: Physical Medicine and Rehabilitation | Admitting: Registered Nurse

## 2020-08-21 ENCOUNTER — Encounter: Payer: Self-pay | Admitting: Registered Nurse

## 2020-08-21 ENCOUNTER — Other Ambulatory Visit: Payer: Self-pay

## 2020-08-21 VITALS — BP 143/92 | HR 117 | Temp 97.8°F | Ht 65.0 in | Wt 268.2 lb

## 2020-08-21 DIAGNOSIS — Z79891 Long term (current) use of opiate analgesic: Secondary | ICD-10-CM | POA: Diagnosis not present

## 2020-08-21 DIAGNOSIS — M7062 Trochanteric bursitis, left hip: Secondary | ICD-10-CM | POA: Insufficient documentation

## 2020-08-21 DIAGNOSIS — Z5181 Encounter for therapeutic drug level monitoring: Secondary | ICD-10-CM | POA: Diagnosis not present

## 2020-08-21 DIAGNOSIS — M7061 Trochanteric bursitis, right hip: Secondary | ICD-10-CM | POA: Diagnosis present

## 2020-08-21 DIAGNOSIS — F411 Generalized anxiety disorder: Secondary | ICD-10-CM | POA: Diagnosis present

## 2020-08-21 DIAGNOSIS — F5101 Primary insomnia: Secondary | ICD-10-CM | POA: Insufficient documentation

## 2020-08-21 DIAGNOSIS — G894 Chronic pain syndrome: Secondary | ICD-10-CM | POA: Diagnosis not present

## 2020-08-21 DIAGNOSIS — M62830 Muscle spasm of back: Secondary | ICD-10-CM | POA: Diagnosis present

## 2020-08-21 DIAGNOSIS — G834 Cauda equina syndrome: Secondary | ICD-10-CM | POA: Diagnosis present

## 2020-08-21 DIAGNOSIS — M5416 Radiculopathy, lumbar region: Secondary | ICD-10-CM | POA: Diagnosis not present

## 2020-08-21 MED ORDER — TIZANIDINE HCL 4 MG PO TABS: ORAL_TABLET | ORAL | 3 refills | Status: DC

## 2020-08-21 MED ORDER — GABAPENTIN 300 MG PO CAPS: ORAL_CAPSULE | ORAL | 3 refills | Status: DC

## 2020-08-21 MED ORDER — DICLOFENAC SODIUM 75 MG PO TBEC: 75.0000 mg | DELAYED_RELEASE_TABLET | Freq: Two times a day (BID) | ORAL | 3 refills | Status: DC

## 2020-08-21 MED ORDER — OXYCODONE-ACETAMINOPHEN 10-325 MG PO TABS: ORAL_TABLET | ORAL | 0 refills | Status: DC

## 2020-08-21 NOTE — Progress Notes (Signed)
Subjective:    Patient ID: [name], female    DOB: 09/11/1970, 50 y.o.   MRN: [id number]  HPI: [name] is a 50 y.o. female who returns for follow up appointment for chronic pain and medication refill. She states her pain is located in her lower back radiating into her left hip. She rates her pain 8. Her current exercise regime is walking and performing stretching exercises.  [name] Morphine equivalent is 70.00 MME.  She  is also prescribed Alprazolam. We have discussed the black box warning of using opioids and benzodiazepines. I highlighted the dangers of using these drugs together and discussed the adverse events including respiratory suppression, overdose, cognitive impairment and importance of compliance with current regimen. We will continue to monitor and adjust as indicated.    UDS ordered today.     Pain Inventory Average Pain 9 Pain Right Now 8 My pain is sharp, dull, stabbing, tingling and aching  In the last 24 hours, has pain interfered with the following? General activity 9 Relation with others 9 Enjoyment of life 9 What TIME of day is your pain at its worst? morning , daytime, evening and night Sleep (in general) Poor  Pain is worse with: walking, bending, sitting and standing Pain improves with: rest, heat/ice and medication Relief from Meds:   Family History  Problem Relation Age of Onset  . Hypertension Maternal Grandmother   . Anesthesia problems Neg Hx    Social History   Socioeconomic History  . Marital status: Divorced    Spouse name: Not on file  . Number of children: Not on file  . Years of education: Not on file  . Highest education level: Not on file  Occupational History  . Not on file  Tobacco Use  . Smoking status: Never Smoker  . Smokeless tobacco: Never Used  Vaping Use  . Vaping Use: Never used  Substance and Sexual Activity  . Alcohol use: No  . Drug use: No  . Sexual activity: Not Currently    Birth  control/protection: None    Comment: IUD removed 2 wks ago.  Other Topics Concern  . Not on file  Social History Narrative  . Not on file   Social Determinants of Health   Financial Resource Strain: Not on file  Food Insecurity: Not on file  Transportation Needs: Not on file  Physical Activity: Not on file  Stress: Not on file  Social Connections: Not on file   Past Surgical History:  Procedure Laterality Date  . ABDOMINAL HYSTERECTOMY  06/29/11   Robotic Assisted Hysterectomy  . CERVICAL SPINE SURGERY  [place]  . LUMBAR LAMINECTOMY  08/31/2011   Procedure: MICRODISCECTOMY LUMBAR LAMINECTOMY;  Surgeon: Tobi Bastos, MD;  [facility];  Service: Orthopedics;  Laterality: N/A;  lumbar five sacral one central microdisectomy  . MYOMECTOMY  13  . NECK SURGERY    . POSTERIOR CERVICAL FUSION/FORAMINOTOMY N/A 02/02/2013   Procedure: POSTERIOR C5-6 SPINAL FUSION;  Surgeon: Melina Schools, MD;  Location: [place];  Service: Orthopedics;  Laterality: N/A;  . POSTERIOR FUSION CERVICAL SPINE  02/02/2013   C 5 C6     [name]  . WISDOM TOOTH EXTRACTION     Past Surgical History:  Procedure Laterality Date  . ABDOMINAL HYSTERECTOMY  06/29/11   Robotic Assisted Hysterectomy  . CERVICAL SPINE SURGERY  [place]  . LUMBAR LAMINECTOMY  08/31/2011   Procedure:  MICRODISCECTOMY LUMBAR LAMINECTOMY;  Surgeon: Tobi Bastos, MD;  [facility];  Service: Orthopedics;  Laterality: N/A;  lumbar five sacral one central microdisectomy  . MYOMECTOMY  13  . NECK SURGERY    . POSTERIOR CERVICAL FUSION/FORAMINOTOMY N/A 02/02/2013   Procedure: POSTERIOR C5-6 SPINAL FUSION;  Surgeon: Melina Schools, MD;  Location: [place];  Service: Orthopedics;  Laterality: N/A;  . POSTERIOR FUSION CERVICAL SPINE  02/02/2013   C 5 C6     [name]  . WISDOM TOOTH EXTRACTION     Past Medical History:  Diagnosis Date  . Anemia    hx  . Arthritis   . Complication of  anesthesia    slow to wake up,   very difficult to get iv had picc line last time  . Difficult intravenous access    hands are usually best for blood draws and IV starts  . DVT (deep venous thrombosis) ([place])    ? vs lupus  . Fibroids   . Headache(784.0)   . Neck pain   . Scoliosis    LMP 04/01/2011   Opioid Risk Score:   Fall Risk Score:  `1  Depression screen PHQ 2/9  Depression screen [facility] 2/9 08/21/2020 07/24/2020 02/23/2020 01/23/2020 08/24/2018 06/29/2018 05/03/2018  Decreased Interest 0 0 0 0 0 0 0  Down, Depressed, Hopeless 0 0 0 0 0 0 0  PHQ - 2 Score 0 0 0 0 0 0 0  Altered sleeping - - - - - - -  Tired, decreased energy - - - - - - -  Change in appetite - - - - - - -  Feeling bad or failure about yourself  - - - - - - -  Trouble concentrating - - - - - - -  Moving slowly or fidgety/restless - - - - - - -  Suicidal thoughts - - - - - - -  PHQ-9 Score - - - - - - -  Difficult doing work/chores - - - - - - -   Review of Systems  Constitutional: Negative.   HENT: Negative.   Eyes: Negative.   Respiratory: Negative.   Cardiovascular: Negative.   Gastrointestinal: Negative.   Endocrine: Negative.   Genitourinary: Negative.   Musculoskeletal: Positive for back pain and gait problem.  Skin: Negative.   Allergic/Immunologic: Negative.   Hematological: Negative.   Psychiatric/Behavioral: Negative.   All other systems reviewed and are negative.      Objective:   Physical Exam Vitals and nursing note reviewed.  Cardiovascular:     Rate and Rhythm: Normal rate and regular rhythm.     Pulses: Normal pulses.     Heart sounds: Normal heart sounds.  Pulmonary:     Effort: Pulmonary effort is normal.     Breath sounds: Normal breath sounds.  Musculoskeletal:     Cervical back: Normal range of motion and neck supple.     Comments: Normal Muscle Bulk and Muscle Testing Reveals:  Upper Extremities: Full ROM and Muscle Strength 5/5 Lumbar Paraspinal Tenderness: L-3-L-5 Lower  Extremities: Full ROM and Muscle Strength 5/5 Arises from Table Slowly Antalgic  Gait   Skin:    General: Skin is warm and dry.  Neurological:     General: No focal deficit present.     Mental Status: She is oriented to person, place, and time.  Psychiatric:        Mood and Affect: Mood normal.        Behavior:  Behavior normal.           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 Status post decompressive laminectomy:Chronic Low Back Pain without Sciatica:Continue to MonitorRefilled:Oxycodone 10mg /325 mg one tablet five times a day as needed#135.08/21/2020 We will continue the opioid monitoring program, this consists of regular clinic visits, examinations, urine drug screen, pill counts as well as use of [place] Controlled Substance Reporting system. A 12 month History has been reviewed on the [place] Controlled Substance Reporting Systemon06/03/2020. 2. Insomnia/ Anxiety:ContinueXanax to 0.5 mg at HS, educated on the [employer], she verbalizes understanding.08/21/2020 3. Lumbar Radiculopathy/Neuropathic pain: Continuecurrent medication regimen withGabapentin.08/21/2020 4. Muscle Spasms. Continuecurrent medication regimen with: Tizanidine.08/21/2020 5. Neurogenic bowel and bladder.Continue to monitor.08/21/2020 6. Constipation/Opioid Therapy: Continuecurrent medication regimen with Linzess. 08/21/2020 7. Depression: Continuecurrent medication regimen withCelexa.Continue to monitor. 08/21/2020 8.LeftGreater Trochanteric Bursitis:Continue Heat/Ice Therapy.08/21/2020 9. Chronic Pain Syndrome: Continuecurrent medication regimen withVoltaren.08/21/2020 10. Chronic Right Knee Pain:No complaints today. Continue HEP as Tolerated.Continue current medication regimenand continue to monitor.08/21/2020 11. Cervicalgia/ Cervical Radiculitis:No complaints today.Continue current  medication regimenand HEP as tolerated.. Continue to Monitor.08/21/2020  F/U in 1 month

## 2020-08-26 LAB — TOXASSURE SELECT,+ANTIDEPR,UR

## 2020-08-29 ENCOUNTER — Telehealth: Payer: Self-pay | Admitting: *Deleted

## 2020-08-29 NOTE — Telephone Encounter (Signed)
Urine drug screen for this encounter is consistent for prescribed medication 

## 2020-09-20 ENCOUNTER — Encounter: Payer: Self-pay | Admitting: Registered Nurse

## 2020-09-20 ENCOUNTER — Other Ambulatory Visit: Payer: Self-pay

## 2020-09-20 ENCOUNTER — Encounter: Payer: 59 | Attending: Physical Medicine and Rehabilitation | Admitting: Registered Nurse

## 2020-09-20 VITALS — BP 145/85 | HR 83 | Temp 98.2°F | Ht 65.0 in | Wt 268.4 lb

## 2020-09-20 DIAGNOSIS — M62830 Muscle spasm of back: Secondary | ICD-10-CM | POA: Diagnosis present

## 2020-09-20 DIAGNOSIS — M5416 Radiculopathy, lumbar region: Secondary | ICD-10-CM | POA: Insufficient documentation

## 2020-09-20 DIAGNOSIS — Z79891 Long term (current) use of opiate analgesic: Secondary | ICD-10-CM | POA: Diagnosis present

## 2020-09-20 DIAGNOSIS — G894 Chronic pain syndrome: Secondary | ICD-10-CM | POA: Diagnosis present

## 2020-09-20 DIAGNOSIS — F5101 Primary insomnia: Secondary | ICD-10-CM | POA: Diagnosis present

## 2020-09-20 DIAGNOSIS — Z5181 Encounter for therapeutic drug level monitoring: Secondary | ICD-10-CM | POA: Insufficient documentation

## 2020-09-20 DIAGNOSIS — F411 Generalized anxiety disorder: Secondary | ICD-10-CM | POA: Diagnosis present

## 2020-09-20 DIAGNOSIS — M7061 Trochanteric bursitis, right hip: Secondary | ICD-10-CM | POA: Insufficient documentation

## 2020-09-20 DIAGNOSIS — G834 Cauda equina syndrome: Secondary | ICD-10-CM | POA: Diagnosis present

## 2020-09-20 DIAGNOSIS — M7062 Trochanteric bursitis, left hip: Secondary | ICD-10-CM | POA: Insufficient documentation

## 2020-09-20 MED ORDER — ALPRAZOLAM 0.5 MG PO TABS: 0.5000 mg | ORAL_TABLET | Freq: Every day | ORAL | 2 refills | Status: DC | PRN

## 2020-09-20 MED ORDER — OXYCODONE-ACETAMINOPHEN 10-325 MG PO TABS: ORAL_TABLET | ORAL | 0 refills | Status: DC

## 2020-09-20 NOTE — Progress Notes (Signed)
Subjective:    Patient ID: [name], female    DOB: 07-14-1970, 50 y.o.   MRN: [id number]  HPI: [name] is a 50 y.o. female who returns for follow up appointment for chronic pain and medication refill. She states her  pain is located in her lower back radiating into her left hip and left lower extremity. She rates her pain 8. Her current exercise regime is walking and performing stretching exercises.   Ms. Mckamey Morphine equivalent is 67.50 MME. She is also prescribed Alprazolam. We have discussed the black box warning of using opioids and benzodiazepines. I highlighted the dangers of using these drugs together and discussed the adverse events including respiratory suppression, overdose, cognitive impairment and importance of compliance with current regimen. We will continue to monitor and adjust as indicated.     Last UDS was Performed 08/21/2020, it was consistent.    Pain Inventory Average Pain 9 Pain Right Now 8 My pain is sharp, dull, stabbing, tingling, and aching  In the last 24 hours, has pain interfered with the following? General activity 9 Relation with others 9 Enjoyment of life 9 What TIME of day is your pain at its worst? morning , daytime, evening, night, and varies Sleep (in general) Poor  Pain is worse with: walking, bending, sitting, standing, and some activites Pain improves with: rest, heat/ice, and medication Relief from Meds: 2  Family History  Problem Relation Age of Onset   Hypertension Maternal Grandmother    Anesthesia problems Neg Hx    Social History   Socioeconomic History   Marital status: Divorced    Spouse name: Not on file   Number of children: Not on file   Years of education: Not on file   Highest education level: Not on file  Occupational History   Not on file  Tobacco Use   Smoking status: Never   Smokeless tobacco: Never  Vaping Use   Vaping Use: Never used  Substance and Sexual Activity   Alcohol use: No   Drug  use: No   Sexual activity: Not Currently    Birth control/protection: None    Comment: IUD removed 2 wks ago.  Other Topics Concern   Not on file  Social History Narrative   Not on file   Social Determinants of Health   Financial Resource Strain: Not on file  Food Insecurity: Not on file  Transportation Needs: Not on file  Physical Activity: Not on file  Stress: Not on file  Social Connections: Not on file   Past Surgical History:  Procedure Laterality Date   ABDOMINAL HYSTERECTOMY  06/29/11   Robotic Assisted Hysterectomy   CERVICAL SPINE SURGERY  [place]  08/31/2011   Procedure: MICRODISCECTOMY LUMBAR LAMINECTOMY;  Surgeon: Tobi Bastos, MD;  [facility];  Service: Orthopedics;  Laterality: N/A;  lumbar five sacral one central microdisectomy   MYOMECTOMY  13   NECK SURGERY     POSTERIOR CERVICAL FUSION/FORAMINOTOMY N/A 02/02/2013   Procedure: POSTERIOR C5-6 SPINAL FUSION;  Surgeon: Melina Schools, MD;  Location: [place];  Service: Orthopedics;  Laterality: N/A;   POSTERIOR FUSION CERVICAL SPINE  02/02/2013   C 5 C6     [name]   WISDOM TOOTH EXTRACTION     Past Surgical History:  Procedure Laterality Date   ABDOMINAL HYSTERECTOMY  06/29/11   Robotic Assisted Hysterectomy   CERVICAL SPINE SURGERY  10   CESAREAN SECTION  1996   LUMBAR LAMINECTOMY  08/31/2011   Procedure: MICRODISCECTOMY LUMBAR LAMINECTOMY;  Surgeon: Tobi Bastos, MD;  [facility];  Service: Orthopedics;  Laterality: N/A;  lumbar five sacral one central microdisectomy   MYOMECTOMY  13   NECK SURGERY     POSTERIOR CERVICAL FUSION/FORAMINOTOMY N/A 02/02/2013   Procedure: POSTERIOR C5-6 SPINAL FUSION;  Surgeon: Melina Schools, MD;  Location: [place];  Service: Orthopedics;  Laterality: N/A;   POSTERIOR FUSION CERVICAL SPINE  02/02/2013   C 5 C6     [name]   WISDOM TOOTH EXTRACTION     Past Medical History:  Diagnosis Date   Anemia    hx   Arthritis     Complication of anesthesia    slow to wake up,   very difficult to get iv had picc line last time   Difficult intravenous access    hands are usually best for blood draws and IV starts   DVT (deep venous thrombosis) ([place])    ? vs lupus   Fibroids    Headache(784.0)    Neck pain    Scoliosis    BP (!) 145/85 (BP Location: Right Arm, Patient Position: Sitting, Cuff Size: Large)   Pulse 83   Temp 98.2 F (36.8 C) (Oral)   Ht 5\' 5"  (1.651 m)   Wt 268 lb 6.4 oz (121.7 kg)   LMP 04/01/2011   SpO2 97%   BMI 44.66 kg/m   Opioid Risk Score:   Fall Risk Score:  `1  Depression screen PHQ 2/9  Depression screen [facility] 2/9 08/21/2020 07/24/2020 02/23/2020 01/23/2020 08/24/2018 06/29/2018 05/03/2018  Decreased Interest 0 0 0 0 0 0 0  Down, Depressed, Hopeless 0 0 0 0 0 0 0  PHQ - 2 Score 0 0 0 0 0 0 0  Altered sleeping - - - - - - -  Tired, decreased energy - - - - - - -  Change in appetite - - - - - - -  Feeling bad or failure about yourself  - - - - - - -  Trouble concentrating - - - - - - -  Moving slowly or fidgety/restless - - - - - - -  Suicidal thoughts - - - - - - -  PHQ-9 Score - - - - - - -  Difficult doing work/chores - - - - - - -      Review of Systems  Constitutional: Negative.   HENT: Negative.    Eyes: Negative.   Respiratory: Negative.    Cardiovascular: Negative.   Gastrointestinal: Negative.   Endocrine: Negative.   Genitourinary: Negative.   Musculoskeletal:  Positive for back pain.       Left hip pain  Skin: Negative.   Allergic/Immunologic: Negative.   Neurological: Negative.   Hematological: Negative.   Psychiatric/Behavioral: Negative.        Objective:   Physical Exam Vitals and nursing note reviewed.  Constitutional:      Appearance: Normal appearance. She is obese.  Cardiovascular:     Rate and Rhythm: Normal rate and regular rhythm.     Pulses: Normal pulses.     Heart sounds: Normal heart sounds.  Pulmonary:     Effort: Pulmonary effort is  normal.     Breath sounds: Normal breath sounds.  Musculoskeletal:     Cervical back: Normal range of motion and neck supple.     Comments: Normal Muscle Bulk and Muscle Testing Reveals:  Upper Extremities:  Full ROM and Muscle Strength 5/5  Lumbar Paraspinal Tenderness: L-3-L-5 Left Greater Trochanter Tenderness Lower Extremities: Full ROM and Muscle Strength 5/5 Arises from Table Slowly using cane for support Antalgic  Gait     Skin:    General: Skin is warm and dry.  Neurological:     Mental Status: She is alert and oriented to person, place, and time.  Psychiatric:        Mood and Affect: Mood normal.        Behavior: Behavior normal.         Assessment & Plan:  1. 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 Status post decompressive laminectomy:Chronic Low Back Pain without Sciatica:  Continue to Monitor  Refilled: Oxycodone 10mg /325 mg one tablet five times a day as needed #135. 09/20/2020 We will continue the opioid monitoring program, this consists of regular clinic visits, examinations, urine drug screen, pill counts as well as use of [place] Controlled Substance Reporting system. A 12 month History has been reviewed on the [place] on 09/20/2020. 2. Insomnia/ Anxiety: Continue  Xanax to 0.5 mg at HS, educated on the [employer], she verbalizes understanding. 09/20/2020 3. Lumbar Radiculopathy/Neuropathic pain: Continue current medication regimen with Gabapentin. 09/20/2020 4. Muscle Spasms. Continue current medication regimen with: Tizanidine. 09/20/2020 5. Neurogenic bowel and bladder. Continue to monitor. 09/20/2020 6. Constipation/Opioid Therapy: Continue current medication regimen with  Linzess. 09/20/2020 7. Depression: Continue current medication regimen with Celexa. Continue to monitor. 09/20/2020 8. Left  Greater Trochanteric Bursitis:  Continue  Heat/Ice Therapy. 09/20/2020 9.  Chronic Pain Syndrome: Continue current medication regimen with Voltaren. 09/20/2020 10. Chronic Right Knee Pain: No complaints today. Continue HEP as Tolerated. Continue current medication regimen and continue to monitor. 09/20/2020 11. Cervicalgia/ Cervical Radiculitis: No complaints today. Continue current medication regimen and HEP as tolerated.. Continue to Monitor. 09/20/2020    F/U in 1 month

## 2020-10-22 ENCOUNTER — Encounter: Payer: 59 | Attending: Physical Medicine and Rehabilitation | Admitting: Registered Nurse

## 2020-10-22 ENCOUNTER — Encounter: Payer: Self-pay | Admitting: Registered Nurse

## 2020-10-22 ENCOUNTER — Other Ambulatory Visit: Payer: Self-pay

## 2020-10-22 VITALS — BP 148/94 | HR 73 | Temp 98.3°F | Ht 65.0 in | Wt 263.0 lb

## 2020-10-22 DIAGNOSIS — G894 Chronic pain syndrome: Secondary | ICD-10-CM | POA: Insufficient documentation

## 2020-10-22 DIAGNOSIS — F411 Generalized anxiety disorder: Secondary | ICD-10-CM | POA: Insufficient documentation

## 2020-10-22 DIAGNOSIS — F5101 Primary insomnia: Secondary | ICD-10-CM | POA: Diagnosis present

## 2020-10-22 DIAGNOSIS — M7061 Trochanteric bursitis, right hip: Secondary | ICD-10-CM | POA: Insufficient documentation

## 2020-10-22 DIAGNOSIS — M62830 Muscle spasm of back: Secondary | ICD-10-CM | POA: Insufficient documentation

## 2020-10-22 DIAGNOSIS — M7062 Trochanteric bursitis, left hip: Secondary | ICD-10-CM | POA: Insufficient documentation

## 2020-10-22 DIAGNOSIS — M5416 Radiculopathy, lumbar region: Secondary | ICD-10-CM | POA: Diagnosis present

## 2020-10-22 DIAGNOSIS — Z79891 Long term (current) use of opiate analgesic: Secondary | ICD-10-CM | POA: Diagnosis present

## 2020-10-22 DIAGNOSIS — Z5181 Encounter for therapeutic drug level monitoring: Secondary | ICD-10-CM | POA: Diagnosis present

## 2020-10-22 DIAGNOSIS — G834 Cauda equina syndrome: Secondary | ICD-10-CM | POA: Insufficient documentation

## 2020-10-22 MED ORDER — OXYCODONE-ACETAMINOPHEN 10-325 MG PO TABS: ORAL_TABLET | ORAL | 0 refills | Status: DC

## 2020-10-22 NOTE — Progress Notes (Signed)
Subjective:    Patient ID: [name], female    DOB: 03/28/1970, 50 y.o.   MRN: [id number]  HPI: [name] is a 50 y.o. female who returns for follow up appointment for chronic pain and medication refill. She states her pain is located in her lower back radiating into her left hip.She  rates her pain 8. Her current exercise regime is walking and performing stretching exercises.  [name] Morphine equivalent is 62.50  MME. She is also prescribed alprazolam prn. We have discussed the black box warning of using opioids and benzodiazepines. I highlighted the dangers of using these drugs together and discussed the adverse events including respiratory suppression, overdose, cognitive impairment and importance of compliance with current regimen. We will continue to monitor and adjust as indicated.   Last UDS was Performed on 08/21/2020, it was consistent.    Pain Inventory Average Pain 9 Pain Right Now 8 My pain is sharp, dull, stabbing, tingling, and aching  In the last 24 hours, has pain interfered with the following? General activity 9 Relation with others 9 Enjoyment of life 9 What TIME of day is your pain at its worst? morning , daytime, evening, and night Sleep (in general) Poor  Pain is worse with: walking, bending, sitting, standing, and some activites Pain improves with: rest, heat/ice, therapy/exercise, and medication Relief from Meds: 2  Family History  Problem Relation Age of Onset   Hypertension Maternal Grandmother    Anesthesia problems Neg Hx    Social History   Socioeconomic History   Marital status: Divorced    Spouse name: Not on file   Number of children: Not on file   Years of education: Not on file   Highest education level: Not on file  Occupational History   Not on file  Tobacco Use   Smoking status: Never   Smokeless tobacco: Never  Vaping Use   Vaping Use: Never used  Substance and Sexual Activity   Alcohol use: No   Drug use: No    Sexual activity: Not Currently    Birth control/protection: None    Comment: IUD removed 2 wks ago.  Other Topics Concern   Not on file  Social History Narrative   Not on file   Social Determinants of Health   Financial Resource Strain: Not on file  Food Insecurity: Not on file  Transportation Needs: Not on file  Physical Activity: Not on file  Stress: Not on file  Social Connections: Not on file   Past Surgical History:  Procedure Laterality Date   ABDOMINAL HYSTERECTOMY  06/29/11   Robotic Assisted Hysterectomy   CERVICAL SPINE SURGERY  [place]  08/31/2011   Procedure: MICRODISCECTOMY LUMBAR LAMINECTOMY;  Surgeon: Tobi Bastos, MD;  [facility];  Service: Orthopedics;  Laterality: N/A;  lumbar five sacral one central microdisectomy   MYOMECTOMY  13   NECK SURGERY     POSTERIOR CERVICAL FUSION/FORAMINOTOMY N/A 02/02/2013   Procedure: POSTERIOR C5-6 SPINAL FUSION;  Surgeon: Melina Schools, MD;  Location: [place];  Service: Orthopedics;  Laterality: N/A;   POSTERIOR FUSION CERVICAL SPINE  02/02/2013   C 5 C6     [name]   WISDOM TOOTH EXTRACTION     Past Surgical History:  Procedure Laterality Date   ABDOMINAL HYSTERECTOMY  06/29/11   Robotic Assisted Hysterectomy   CERVICAL SPINE SURGERY  10   CESAREAN SECTION  1996   LUMBAR LAMINECTOMY  08/31/2011   Procedure: MICRODISCECTOMY LUMBAR LAMINECTOMY;  Surgeon: Tobi Bastos, MD;  [facility];  Service: Orthopedics;  Laterality: N/A;  lumbar five sacral one central microdisectomy   MYOMECTOMY  13   NECK SURGERY     POSTERIOR CERVICAL FUSION/FORAMINOTOMY N/A 02/02/2013   Procedure: POSTERIOR C5-6 SPINAL FUSION;  Surgeon: Melina Schools, MD;  Location: [place];  Service: Orthopedics;  Laterality: N/A;   POSTERIOR FUSION CERVICAL SPINE  02/02/2013   C 5 C6     [name]   WISDOM TOOTH EXTRACTION     Past Medical History:  Diagnosis Date   Anemia    hx   Arthritis     Complication of anesthesia    slow to wake up,   very difficult to get iv had picc line last time   Difficult intravenous access    hands are usually best for blood draws and IV starts   DVT (deep venous thrombosis) ([place])    ? vs lupus   Fibroids    Headache(784.0)    Neck pain    Scoliosis    BP (!) 148/94 (BP Location: Right Arm)   Pulse 73   Temp 98.3 F (36.8 C) (Oral)   Ht '5\' 5"'$  (1.651 m)   Wt 263 lb (119.3 kg)   LMP 04/01/2011   SpO2 94%   BMI 43.77 kg/m   Opioid Risk Score:   Fall Risk Score:  `1  Depression screen PHQ 2/9  Depression screen [facility] 2/9 09/20/2020 08/21/2020 07/24/2020 02/23/2020 01/23/2020 08/24/2018 06/29/2018  Decreased Interest 0 0 0 0 0 0 0  Down, Depressed, Hopeless - 0 0 0 0 0 0  PHQ - 2 Score 0 0 0 0 0 0 0  Altered sleeping 0 - - - - - -  Tired, decreased energy - - - - - - -  Change in appetite - - - - - - -  Feeling bad or failure about yourself  - - - - - - -  Trouble concentrating - - - - - - -  Moving slowly or fidgety/restless - - - - - - -  Suicidal thoughts - - - - - - -  PHQ-9 Score 0 - - - - - -  Difficult doing work/chores - - - - - - -       Review of Systems  Constitutional: Negative.   HENT: Negative.    Eyes: Negative.   Respiratory: Negative.    Cardiovascular: Negative.   Gastrointestinal: Negative.   Endocrine: Negative.   Genitourinary: Negative.   Musculoskeletal:  Positive for back pain and gait problem.       Pain in left hip  Skin: Negative.   Allergic/Immunologic: Negative.   Hematological: Negative.   Psychiatric/Behavioral: Negative.        Objective:   Physical Exam Vitals and nursing note reviewed.  Constitutional:      Appearance: Normal appearance.  Cardiovascular:     Rate and Rhythm: Normal rate and regular rhythm.     Pulses: Normal pulses.     Heart sounds: Normal heart sounds.  Pulmonary:     Effort: Pulmonary effort is normal.     Breath sounds: Normal breath sounds.  Musculoskeletal:      Cervical back: Normal range of motion and neck supple.     Comments: Normal Muscle Bulk and Muscle Testing Reveals:  Upper Extremities: Full ROM and Muscle Strength 5/5  Lumbar Paraspinal Tenderness: L-3-L-5 Left Greater Trochanter Tenderness Lower  Extremities: Full ROM and Muscle Strength 5/5 Arises from Table slowly Narrow Based  Gait     Skin:    General: Skin is warm and dry.  Neurological:     Mental Status: She is alert and oriented to person, place, and time.  Psychiatric:        Mood and Affect: Mood normal.        Behavior: Behavior normal.         Assessment & Plan:  1. 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 Status post decompressive laminectomy:Chronic Low Back Pain without Sciatica:  Continue to Monitor  Refilled: Oxycodone '10mg'$ /325 mg one tablet five times a day as needed #135. 10/22/2020 We will continue the opioid monitoring program, this consists of regular clinic visits, examinations, urine drug screen, pill counts as well as use of [place] Controlled Substance Reporting system. A 12 month History has been reviewed on the [place] on 10/22/2020. 2. Insomnia/ Anxiety: Continue  Xanax to 0.5 mg at HS, educated on the [employer], she verbalizes understanding. 10/22/2020 3. Lumbar Radiculopathy/Neuropathic pain: Continue current medication regimen with Gabapentin. 10/22/2020 4. Muscle Spasms. Continue current medication regimen with: Tizanidine. 10/22/2020 5. Neurogenic bowel and bladder. Continue to monitor. 10/22/2020 6. Constipation/Opioid Therapy: Continue current medication regimen with  Linzess. 10/22/2020 7. Depression: Continue current medication regimen with Celexa. Continue to monitor. 10/22/2020 8. Left  Greater Trochanteric Bursitis:  Continue  Heat/Ice Therapy. 10/22/2020 9. Chronic Pain Syndrome: Continue current medication regimen with Voltaren. 10/22/2020 10.  Chronic Right Knee Pain: No complaints today. Continue HEP as Tolerated. Continue current medication regimen and continue to monitor. 10/22/2020 11. Cervicalgia/ Cervical Radiculitis: No complaints today. Continue current medication regimen and HEP as tolerated.. Continue to Monitor. 10/22/2020    F/U in 1 month

## 2020-11-22 ENCOUNTER — Encounter: Payer: Self-pay | Admitting: Registered Nurse

## 2020-11-22 ENCOUNTER — Other Ambulatory Visit: Payer: Self-pay

## 2020-11-22 ENCOUNTER — Encounter: Payer: 59 | Attending: Physical Medicine and Rehabilitation | Admitting: Registered Nurse

## 2020-11-22 VITALS — BP 136/83 | HR 104 | Temp 97.9°F | Ht 65.0 in | Wt 261.2 lb

## 2020-11-22 DIAGNOSIS — F411 Generalized anxiety disorder: Secondary | ICD-10-CM | POA: Diagnosis not present

## 2020-11-22 DIAGNOSIS — M62830 Muscle spasm of back: Secondary | ICD-10-CM | POA: Diagnosis present

## 2020-11-22 DIAGNOSIS — G894 Chronic pain syndrome: Secondary | ICD-10-CM | POA: Diagnosis present

## 2020-11-22 DIAGNOSIS — M7062 Trochanteric bursitis, left hip: Secondary | ICD-10-CM | POA: Insufficient documentation

## 2020-11-22 DIAGNOSIS — M5416 Radiculopathy, lumbar region: Secondary | ICD-10-CM | POA: Diagnosis not present

## 2020-11-22 DIAGNOSIS — G834 Cauda equina syndrome: Secondary | ICD-10-CM | POA: Insufficient documentation

## 2020-11-22 DIAGNOSIS — F5101 Primary insomnia: Secondary | ICD-10-CM | POA: Insufficient documentation

## 2020-11-22 DIAGNOSIS — Z5181 Encounter for therapeutic drug level monitoring: Secondary | ICD-10-CM | POA: Insufficient documentation

## 2020-11-22 DIAGNOSIS — Z79891 Long term (current) use of opiate analgesic: Secondary | ICD-10-CM | POA: Diagnosis present

## 2020-11-22 DIAGNOSIS — M7061 Trochanteric bursitis, right hip: Secondary | ICD-10-CM | POA: Diagnosis present

## 2020-11-22 MED ORDER — TIZANIDINE HCL 4 MG PO TABS: ORAL_TABLET | ORAL | 3 refills | Status: DC

## 2020-11-22 MED ORDER — GABAPENTIN 300 MG PO CAPS: ORAL_CAPSULE | ORAL | 3 refills | Status: DC

## 2020-11-22 MED ORDER — OXYCODONE-ACETAMINOPHEN 10-325 MG PO TABS: ORAL_TABLET | ORAL | 0 refills | Status: DC

## 2020-11-22 MED ORDER — DICLOFENAC SODIUM 75 MG PO TBEC: 75.0000 mg | DELAYED_RELEASE_TABLET | Freq: Two times a day (BID) | ORAL | 3 refills | Status: DC

## 2020-11-22 NOTE — Progress Notes (Signed)
Subjective:    Patient ID: [name], female    DOB: 09/19/1970, 50 y.o.   MRN: [id number]  HPI: [name] is a 50 y.o. female who returns for follow up appointment for chronic pain and medication refill. She states her pain is located in her lower back radiating into her left hip and left lower extremity. She rates her pain 9. Her current exercise regime is walking and performing stretching exercises.  [name] Morphine equivalent is 65.00 MME.   Last UDS was Performed on 08/21/2020, it was consistent.      Pain Inventory Average Pain 9 Pain Right Now 9 My pain is constant, sharp, dull, stabbing, tingling, and aching  In the last 24 hours, has pain interfered with the following? General activity 8 Relation with others 9 Enjoyment of life 8 What TIME of day is your pain at its worst? morning , daytime, evening, and night Sleep (in general) Poor  Pain is worse with: walking, bending, sitting, standing, and some activites Pain improves with: heat/ice, therapy/exercise, and medication Relief from Meds: 3  Family History  Problem Relation Age of Onset   Hypertension Maternal Grandmother    Anesthesia problems Neg Hx    Social History   Socioeconomic History   Marital status: Divorced    Spouse name: Not on file   Number of children: Not on file   Years of education: Not on file   Highest education level: Not on file  Occupational History   Not on file  Tobacco Use   Smoking status: Never   Smokeless tobacco: Never  Vaping Use   Vaping Use: Never used  Substance and Sexual Activity   Alcohol use: No   Drug use: No   Sexual activity: Not Currently    Birth control/protection: None    Comment: IUD removed 2 wks ago.  Other Topics Concern   Not on file  Social History Narrative   Not on file   Social Determinants of Health   Financial Resource Strain: Not on file  Food Insecurity: Not on file  Transportation Needs: Not on file  Physical Activity: Not  on file  Stress: Not on file  Social Connections: Not on file   Past Surgical History:  Procedure Laterality Date   ABDOMINAL HYSTERECTOMY  06/29/11   Robotic Assisted Hysterectomy   CERVICAL SPINE SURGERY  [place]  08/31/2011   Procedure: MICRODISCECTOMY LUMBAR LAMINECTOMY;  Surgeon: Tobi Bastos, MD;  [facility]: [facility];  Service: Orthopedics;  Laterality: N/A;  lumbar five sacral one central microdisectomy   MYOMECTOMY  13   NECK SURGERY     POSTERIOR CERVICAL FUSION/FORAMINOTOMY N/A 02/02/2013   Procedure: POSTERIOR C5-6 SPINAL FUSION;  Surgeon: Melina Schools, MD;  Location: [place];  Service: Orthopedics;  Laterality: N/A;   POSTERIOR FUSION CERVICAL SPINE  02/02/2013   C 5 C6     [name]   WISDOM TOOTH EXTRACTION     Past Surgical History:  Procedure Laterality Date   ABDOMINAL HYSTERECTOMY  06/29/11   Robotic Assisted Hysterectomy   CERVICAL SPINE SURGERY  10   CESAREAN SECTION  1996   LUMBAR LAMINECTOMY  08/31/2011   Procedure: MICRODISCECTOMY LUMBAR LAMINECTOMY;  Surgeon: Tobi Bastos, MD;  [facility];  Service: Orthopedics;  Laterality: N/A;  lumbar five sacral one central microdisectomy   MYOMECTOMY  13   NECK SURGERY     POSTERIOR CERVICAL FUSION/FORAMINOTOMY N/A 02/02/2013  Procedure: POSTERIOR C5-6 SPINAL FUSION;  Surgeon: Melina Schools, MD;  Location: [place];  Service: Orthopedics;  Laterality: N/A;   POSTERIOR FUSION CERVICAL SPINE  02/02/2013   C 5 C6     [name]   WISDOM TOOTH EXTRACTION     Past Medical History:  Diagnosis Date   Anemia    hx   Arthritis    Complication of anesthesia    slow to wake up,   very difficult to get iv had picc line last time   Difficult intravenous access    hands are usually best for blood draws and IV starts   DVT (deep venous thrombosis) ([place])    ? vs lupus   Fibroids    Headache(784.0)    Neck pain    Scoliosis    BP 136/83   Pulse (!) 104   Temp 97.9 F (36.6 C)    Ht '5\' 5"'$  (1.651 m)   Wt 261 lb 3.2 oz (118.5 kg)   LMP 04/01/2011   SpO2 98%   BMI 43.47 kg/m   Opioid Risk Score:   Fall Risk Score:  `1  Depression screen PHQ 2/9  Depression screen [place] 2/9 10/22/2020 09/20/2020 08/21/2020 07/24/2020 02/23/2020 01/23/2020 08/24/2018  Decreased Interest 0 0 0 0 0 0 0  Down, Depressed, Hopeless 0 - 0 0 0 0 0  PHQ - 2 Score 0 0 0 0 0 0 0  Altered sleeping - 0 - - - - -  Tired, decreased energy - - - - - - -  Change in appetite - - - - - - -  Feeling bad or failure about yourself  - - - - - - -  Trouble concentrating - - - - - - -  Moving slowly or fidgety/restless - - - - - - -  Suicidal thoughts - - - - - - -  PHQ-9 Score - 0 - - - - -  Difficult doing work/chores - - - - - - -    Review of Systems  Musculoskeletal:  Positive for back pain.       Left side pain  All other systems reviewed and are negative.     Objective:   Physical Exam Vitals and nursing note reviewed.  Constitutional:      Appearance: Normal appearance.  Cardiovascular:     Rate and Rhythm: Normal rate and regular rhythm.     Pulses: Normal pulses.     Heart sounds: Normal heart sounds.  Pulmonary:     Effort: Pulmonary effort is normal.     Breath sounds: Normal breath sounds.  Musculoskeletal:     Cervical back: Normal range of motion and neck supple.     Comments: Normal Muscle Bulk and Muscle Testing Reveals:  Upper Extremities: Full ROM and Muscle Strength  5/5 Lumbar Hypersensitivity Left Greater Trochanter tenderness Lower Extremities: Full ROM and Muscle Strength 5/5 Arises from Table slowly using cane for support Antalgic  Gait     Skin:    General: Skin is warm and dry.  Neurological:     Mental Status: She is alert and oriented to person, place, and time.  Psychiatric:        Mood and Affect: Mood normal.        Behavior: Behavior normal.         Assessment & Plan:  1. 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  Status post decompressive laminectomy:Chronic Low Back Pain without Sciatica:  Continue to Monitor  Refilled: Oxycodone '10mg'$ /325 mg one tablet five times a day as needed #135. 11/22/2020 We will continue the opioid monitoring program, this consists of regular clinic visits, examinations, urine drug screen, pill counts as well as use of [place] Controlled Substance Reporting system. A 12 month History has been reviewed on the [place] on 11/22/2020. 2. Insomnia/ Anxiety: Continue  Xanax to 0.5 mg at HS, educated on the [employer], she verbalizes understanding. 11/22/2020 3. Lumbar Radiculopathy/Neuropathic pain: Continue current medication regimen with Gabapentin. 11/22/2020 4. Muscle Spasms. Continue current medication regimen with: Tizanidine. 11/22/2020 5. Neurogenic bowel and bladder. Continue to monitor. 11/22/2020 6. Constipation/Opioid Therapy: Continue current medication regimen with  Linzess. 11/22/2020 7. Depression: Continue current medication regimen with Celexa. Continue to monitor. 11/22/2020 8. Left  Greater Trochanteric Bursitis:  Continue  Heat/Ice Therapy. 11/22/2020 9. Chronic Pain Syndrome: Continue current medication regimen with Voltaren. 11/22/2020 10. Chronic Right Knee Pain: No complaints today. Continue HEP as Tolerated. Continue current medication regimen and continue to monitor. 11/22/2020 11. Cervicalgia/ Cervical Radiculitis: No complaints today. Continue current medication regimen and HEP as tolerated.. Continue to Monitor. 11/22/2020    F/U in 1 month

## 2020-11-28 NOTE — Telephone Encounter (Signed)
error 

## 2020-12-27 ENCOUNTER — Ambulatory Visit: Payer: 59 | Admitting: Registered Nurse

## 2020-12-30 ENCOUNTER — Other Ambulatory Visit: Payer: Self-pay

## 2020-12-30 ENCOUNTER — Encounter: Payer: Self-pay | Admitting: Registered Nurse

## 2020-12-30 ENCOUNTER — Encounter: Payer: 59 | Attending: Physical Medicine and Rehabilitation | Admitting: Registered Nurse

## 2020-12-30 VITALS — BP 132/98 | HR 95 | Temp 97.6°F | Ht 65.0 in | Wt 261.0 lb

## 2020-12-30 DIAGNOSIS — M7062 Trochanteric bursitis, left hip: Secondary | ICD-10-CM | POA: Insufficient documentation

## 2020-12-30 DIAGNOSIS — Z5181 Encounter for therapeutic drug level monitoring: Secondary | ICD-10-CM | POA: Insufficient documentation

## 2020-12-30 DIAGNOSIS — M62830 Muscle spasm of back: Secondary | ICD-10-CM | POA: Insufficient documentation

## 2020-12-30 DIAGNOSIS — M7061 Trochanteric bursitis, right hip: Secondary | ICD-10-CM | POA: Diagnosis present

## 2020-12-30 DIAGNOSIS — F411 Generalized anxiety disorder: Secondary | ICD-10-CM | POA: Diagnosis present

## 2020-12-30 DIAGNOSIS — M5416 Radiculopathy, lumbar region: Secondary | ICD-10-CM | POA: Diagnosis not present

## 2020-12-30 DIAGNOSIS — F5101 Primary insomnia: Secondary | ICD-10-CM | POA: Diagnosis present

## 2020-12-30 DIAGNOSIS — G894 Chronic pain syndrome: Secondary | ICD-10-CM | POA: Diagnosis present

## 2020-12-30 DIAGNOSIS — G834 Cauda equina syndrome: Secondary | ICD-10-CM | POA: Diagnosis present

## 2020-12-30 DIAGNOSIS — Z79891 Long term (current) use of opiate analgesic: Secondary | ICD-10-CM | POA: Diagnosis present

## 2020-12-30 MED ORDER — ALPRAZOLAM 0.5 MG PO TABS: 0.5000 mg | ORAL_TABLET | Freq: Every day | ORAL | 2 refills | Status: DC | PRN

## 2020-12-30 MED ORDER — OXYCODONE-ACETAMINOPHEN 10-325 MG PO TABS: ORAL_TABLET | ORAL | 0 refills | Status: DC

## 2020-12-30 NOTE — Progress Notes (Signed)
Subjective:    Patient ID: [name], female    DOB: Jul 25, 1970, 50 y.o.   MRN: [id number]  HPI: [name] is a 50 y.o. female who returns for follow up appointment for chronic pain and medication refill. She states her pain is located in her lower back radiating into her left hip. She rates her pain 9. Her current exercise regime is walking and performing stretching exercises.  Ms. Lapka Morphine equivalent is 60.00 MME.She  is also prescribed Alprazolam .We have discussed the black box warning of using opioids and benzodiazepines. I highlighted the dangers of using these drugs together and discussed the adverse events including respiratory suppression, overdose, cognitive impairment and importance of compliance with current regimen. We will continue to monitor and adjust as indicated.   UDS was  ordered today.     Pain Inventory Average Pain 9 Pain Right Now 9 My pain is constant, sharp, burning, dull, stabbing, tingling, and aching  In the last 24 hours, has pain interfered with the following? General activity 8 Relation with others 9 Enjoyment of life 8 What TIME of day is your pain at its worst? morning , daytime, evening, and night Sleep (in general) Poor  Pain is worse with: walking, bending, sitting, standing, and some activites Pain improves with: heat/ice, therapy/exercise, and medication Relief from Meds: 3  Family History  Problem Relation Age of Onset   Hypertension Maternal Grandmother    Anesthesia problems Neg Hx    Social History   Socioeconomic History   Marital status: Divorced    Spouse name: Not on file   Number of children: Not on file   Years of education: Not on file   Highest education level: Not on file  Occupational History   Not on file  Tobacco Use   Smoking status: Never   Smokeless tobacco: Never  Vaping Use   Vaping Use: Never used  Substance and Sexual Activity   Alcohol use: No   Drug use: No   Sexual activity: Not  Currently    Birth control/protection: None    Comment: IUD removed 2 wks ago.  Other Topics Concern   Not on file  Social History Narrative   Not on file   Social Determinants of Health   Financial Resource Strain: Not on file  Food Insecurity: Not on file  Transportation Needs: Not on file  Physical Activity: Not on file  Stress: Not on file  Social Connections: Not on file   Past Surgical History:  Procedure Laterality Date   ABDOMINAL HYSTERECTOMY  06/29/11   Robotic Assisted Hysterectomy   CERVICAL SPINE SURGERY  [place]  08/31/2011   Procedure: MICRODISCECTOMY LUMBAR LAMINECTOMY;  Surgeon: Tobi Bastos, MD;  [facility];  Service: Orthopedics;  Laterality: N/A;  lumbar five sacral one central microdisectomy   MYOMECTOMY  13   NECK SURGERY     POSTERIOR CERVICAL FUSION/FORAMINOTOMY N/A 02/02/2013   Procedure: POSTERIOR C5-6 SPINAL FUSION;  Surgeon: Melina Schools, MD;  Location: [facility];  Service: Orthopedics;  Laterality: N/A;   POSTERIOR FUSION CERVICAL SPINE  02/02/2013   C 5 C6     [name]   WISDOM TOOTH EXTRACTION     Past Surgical History:  Procedure Laterality Date   ABDOMINAL HYSTERECTOMY  06/29/11   Robotic Assisted Hysterectomy   CERVICAL SPINE SURGERY  10   CESAREAN SECTION  1996   LUMBAR LAMINECTOMY  08/31/2011  Procedure: MICRODISCECTOMY LUMBAR LAMINECTOMY;  Surgeon: Tobi Bastos, MD;  [facility];  Service: Orthopedics;  Laterality: N/A;  lumbar five sacral one central microdisectomy   MYOMECTOMY  13   NECK SURGERY     POSTERIOR CERVICAL FUSION/FORAMINOTOMY N/A 02/02/2013   Procedure: POSTERIOR C5-6 SPINAL FUSION;  Surgeon: Melina Schools, MD;  Location: [facility];  Service: Orthopedics;  Laterality: N/A;   POSTERIOR FUSION CERVICAL SPINE  02/02/2013   C 5 C6     [name]   WISDOM TOOTH EXTRACTION     Past Medical History:  Diagnosis Date   Anemia    hx   Arthritis    Complication of anesthesia     slow to wake up,   very difficult to get iv had picc line last time   Difficult intravenous access    hands are usually best for blood draws and IV starts   DVT (deep venous thrombosis) ([place])    ? vs lupus   Fibroids    Headache(784.0)    Neck pain    Scoliosis    BP (!) 132/98   Pulse 95   Temp 97.6 F (36.4 C) (Oral)   Ht 5\' 5"  (1.651 m)   Wt 261 lb (118.4 kg)   LMP 04/01/2011   SpO2 98%   BMI 43.43 kg/m   Opioid Risk Score:   Fall Risk Score:  `1  Depression screen PHQ 2/9  Depression screen [facility] 2/9 11/22/2020 10/22/2020 09/20/2020 08/21/2020 07/24/2020 02/23/2020 01/23/2020  Decreased Interest 0 0 0 0 0 0 0  Down, Depressed, Hopeless 0 0 - 0 0 0 0  PHQ - 2 Score 0 0 0 0 0 0 0  Altered sleeping - - 0 - - - -  Tired, decreased energy - - - - - - -  Change in appetite - - - - - - -  Feeling bad or failure about yourself  - - - - - - -  Trouble concentrating - - - - - - -  Moving slowly or fidgety/restless - - - - - - -  Suicidal thoughts - - - - - - -  PHQ-9 Score - - 0 - - - -  Difficult doing work/chores - - - - - - -     Review of Systems  Musculoskeletal:  Positive for back pain.       Left shoulder pain  All other systems reviewed and are negative.     Objective:   Physical Exam Vitals and nursing note reviewed.  Constitutional:      Appearance: Normal appearance.  Cardiovascular:     Rate and Rhythm: Normal rate and regular rhythm.     Pulses: Normal pulses.     Heart sounds: Normal heart sounds.  Pulmonary:     Effort: Pulmonary effort is normal.     Breath sounds: Normal breath sounds.  Musculoskeletal:     Cervical back: Normal range of motion and neck supple.     Comments: Normal Muscle Bulk and Muscle Testing Reveals:  Upper Extremities:Full  ROM and Muscle Strength 5/5  Lumbar Hypersensitivity Left Greater Trochanter Tenderness Lower Extremities: Full ROM and Muscle Strength 5/5 Arises from Table Slowly  Antalgic Gait     Skin:    General:  Skin is warm and dry.  Neurological:     Mental Status: She is alert and oriented to person, place, and time.  Psychiatric:        Mood and Affect: Mood  normal.        Behavior: Behavior normal.         Assessment & Plan:  1. 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 Status post decompressive laminectomy:Chronic Low Back Pain without Sciatica:  Continue to Monitor  Refilled: Oxycodone 10mg /325 mg one tablet five times a day as needed #135. 12/30/2020 We will continue the opioid monitoring program, this consists of regular clinic visits, examinations, urine drug screen, pill counts as well as use of [place] Controlled Substance Reporting system. A 12 month History has been reviewed on the [place] on 12/30/2020. 2. Insomnia/ Anxiety: Continue  Xanax to 0.5 mg at HS, educated on the [employer], she verbalizes understanding. 12/30/2020 3. Lumbar Radiculopathy/Neuropathic pain: Continue current medication regimen with Gabapentin. 12/30/2020 4. Muscle Spasms. Continue current medication regimen with: Tizanidine. 12/30/2020 5. Neurogenic bowel and bladder. Continue to monitor. 12/30/2020 6. Constipation/Opioid Therapy: Continue current medication regimen with  Linzess. 12/30/2020 7. Depression: Continue current medication regimen with Celexa. Continue to monitor. 12/30/2020 8. Left  Greater Trochanteric Bursitis:  Continue  Heat/Ice Therapy. 12/30/2020 9. Chronic Pain Syndrome: Continue current medication regimen with Voltaren. 12/30/2020 10. Chronic Right Knee Pain: No complaints today. Continue HEP as Tolerated. Continue current medication regimen and continue to monitor. 12/30/2020 11. Cervicalgia/ Cervical Radiculitis: No complaints today. Continue current medication regimen and HEP as tolerated.. Continue to Monitor. 12/30/2020    F/U in 1 month

## 2021-01-03 LAB — TOXASSURE SELECT,+ANTIDEPR,UR

## 2021-01-06 ENCOUNTER — Telehealth: Payer: Self-pay | Admitting: *Deleted

## 2021-01-06 NOTE — Telephone Encounter (Signed)
Urine drug screen is positive for oxycodone only, no metabolite. She has had several atypical results(3). Suggest she take her medication while in office and come back later in the day for UDS to clarify.

## 2021-01-07 ENCOUNTER — Ambulatory Visit: Payer: 59 | Admitting: Registered Nurse

## 2021-01-21 ENCOUNTER — Ambulatory Visit: Payer: 59 | Admitting: Registered Nurse

## 2021-01-27 ENCOUNTER — Encounter: Payer: 59 | Attending: Physical Medicine and Rehabilitation | Admitting: Registered Nurse

## 2021-01-27 ENCOUNTER — Encounter: Payer: Self-pay | Admitting: Registered Nurse

## 2021-01-27 ENCOUNTER — Other Ambulatory Visit: Payer: Self-pay

## 2021-01-27 VITALS — BP 120/87 | HR 98 | Ht 65.0 in | Wt 257.8 lb

## 2021-01-27 DIAGNOSIS — M7061 Trochanteric bursitis, right hip: Secondary | ICD-10-CM | POA: Insufficient documentation

## 2021-01-27 DIAGNOSIS — G8929 Other chronic pain: Secondary | ICD-10-CM | POA: Diagnosis present

## 2021-01-27 DIAGNOSIS — M7062 Trochanteric bursitis, left hip: Secondary | ICD-10-CM | POA: Diagnosis not present

## 2021-01-27 DIAGNOSIS — Z5181 Encounter for therapeutic drug level monitoring: Secondary | ICD-10-CM | POA: Insufficient documentation

## 2021-01-27 DIAGNOSIS — G894 Chronic pain syndrome: Secondary | ICD-10-CM | POA: Diagnosis not present

## 2021-01-27 DIAGNOSIS — G834 Cauda equina syndrome: Secondary | ICD-10-CM | POA: Insufficient documentation

## 2021-01-27 DIAGNOSIS — M545 Low back pain, unspecified: Secondary | ICD-10-CM | POA: Insufficient documentation

## 2021-01-27 DIAGNOSIS — Z79891 Long term (current) use of opiate analgesic: Secondary | ICD-10-CM | POA: Insufficient documentation

## 2021-01-27 MED ORDER — OXYCODONE-ACETAMINOPHEN 10-325 MG PO TABS: ORAL_TABLET | ORAL | 0 refills | Status: DC

## 2021-01-27 NOTE — Progress Notes (Signed)
Subjective:    Patient ID: [name], female    DOB: [birth year], 50 y.o.   MRN: [id number]  HPI: [name] is a 50 y.o. female who returns for follow up appointment for chronic pain and medication refill. She states her pain is located in her lower back and left hip pain. She rates her pain 9. Her current exercise regime is walking and performing stretching exercises.  [name] equivalent is 67.50 MME.  Last UDS was Performed on 12/30/2020, it was consistent.      Pain Inventory Average Pain 9 Pain Right Now 9 My pain is constant, sharp, burning, dull, stabbing, tingling, and aching  In the last 24 hours, has pain interfered with the following? General activity 8 Relation with others 9 Enjoyment of life 8 What TIME of day is your pain at its worst? morning , daytime, evening, and night Sleep (in general) Poor  Pain is worse with: walking, bending, sitting, standing, and some activites Pain improves with: rest, heat/ice, therapy/exercise, medication, and injections Relief from Meds: 1  Family History  Problem Relation Age of Onset   Hypertension Maternal Grandmother    Anesthesia problems Neg Hx    Social History   Socioeconomic History   Marital status: Divorced    Spouse name: Not on file   Number of children: Not on file   Years of education: Not on file   Highest education level: Not on file  Occupational History   Not on file  Tobacco Use   Smoking status: Never   Smokeless tobacco: Never  Vaping Use   Vaping Use: Never used  Substance and Sexual Activity   Alcohol use: No   Drug use: No   Sexual activity: Not Currently    Birth control/protection: None    Comment: IUD removed 2 wks ago.  Other Topics Concern   Not on file  Social History Narrative   Not on file   Social Determinants of Health   Financial Resource Strain: Not on file  Food Insecurity: Not on file  Transportation Needs: Not on file  Physical Activity: Not on file   Stress: Not on file  Social Connections: Not on file   Past Surgical History:  Procedure Laterality Date   ABDOMINAL HYSTERECTOMY  06/29/11   Robotic Assisted Hysterectomy   CERVICAL SPINE SURGERY  [place]  08/31/2011   Procedure: MICRODISCECTOMY LUMBAR LAMINECTOMY;  Surgeon: Tobi Bastos, MD;  [facility];  Service: Orthopedics;  Laterality: N/A;  lumbar five sacral one central microdisectomy   MYOMECTOMY  13   NECK SURGERY     POSTERIOR CERVICAL FUSION/FORAMINOTOMY N/A 02/02/2013   Procedure: POSTERIOR C5-6 SPINAL FUSION;  Surgeon: Melina Schools, MD;  Location: [facility];  Service: Orthopedics;  Laterality: N/A;   POSTERIOR FUSION CERVICAL SPINE  02/02/2013   C 5 C6     [name]   WISDOM TOOTH EXTRACTION     Past Surgical History:  Procedure Laterality Date   ABDOMINAL HYSTERECTOMY  06/29/11   Robotic Assisted Hysterectomy   CERVICAL SPINE SURGERY  10   CESAREAN SECTION  1996   LUMBAR LAMINECTOMY  08/31/2011   Procedure: MICRODISCECTOMY LUMBAR LAMINECTOMY;  Surgeon: Tobi Bastos, MD;  [facility];  Service: Orthopedics;  Laterality: N/A;  lumbar five sacral one central microdisectomy   MYOMECTOMY  13   NECK SURGERY     POSTERIOR CERVICAL FUSION/FORAMINOTOMY N/A 02/02/2013   Procedure:  POSTERIOR C5-6 SPINAL FUSION;  Surgeon: Melina Schools, MD;  Location: [place];  Service: Orthopedics;  Laterality: N/A;   POSTERIOR FUSION CERVICAL SPINE  02/02/2013   C 5 C6     [name]   WISDOM TOOTH EXTRACTION     Past Medical History:  Diagnosis Date   Anemia    hx   Arthritis    Complication of anesthesia    slow to wake up,   very difficult to get iv had picc line last time   Difficult intravenous access    hands are usually best for blood draws and IV starts   DVT (deep venous thrombosis) ([place])    ? vs lupus   Fibroids    Headache(784.0)    Neck pain    Scoliosis    BP 120/87   Pulse 98   Ht 5\' 5"  (1.651 m)   Wt 257 lb  12.8 oz (116.9 kg)   LMP 04/01/2011   SpO2 99%   BMI 42.90 kg/m   Opioid Risk Score:   Fall Risk Score:  `1  Depression screen PHQ 2/9  Depression screen [facility] 2/9 11/22/2020 10/22/2020 09/20/2020 08/21/2020 07/24/2020 02/23/2020 01/23/2020  Decreased Interest 0 0 0 0 0 0 0  Down, Depressed, Hopeless 0 0 - 0 0 0 0  PHQ - 2 Score 0 0 0 0 0 0 0  Altered sleeping - - 0 - - - -  Tired, decreased energy - - - - - - -  Change in appetite - - - - - - -  Feeling bad or failure about yourself  - - - - - - -  Trouble concentrating - - - - - - -  Moving slowly or fidgety/restless - - - - - - -  Suicidal thoughts - - - - - - -  PHQ-9 Score - - 0 - - - -  Difficult doing work/chores - - - - - - -     Review of Systems  Musculoskeletal:  Positive for back pain.       Left shoulder pain  All other systems reviewed and are negative.     Objective:   Physical Exam Vitals and nursing note reviewed.  Constitutional:      Appearance: Normal appearance.  Cardiovascular:     Rate and Rhythm: Normal rate and regular rhythm.     Pulses: Normal pulses.     Heart sounds: Normal heart sounds.  Pulmonary:     Effort: Pulmonary effort is normal.     Breath sounds: Normal breath sounds.  Musculoskeletal:     Cervical back: Normal range of motion and neck supple.     Comments: Normal Muscle Bulk and Muscle Testing Reveals:  Upper Extremities: Full ROM and Muscle Strength 5/5  Lumbar Paraspinal Tenderness: L-4-L-5 Left Greater Trochanter Tenderness Lower Extremities: Full ROM and Muscle Strength 5/5 Arises from Table with ease Narrow Based  Gait     Skin:    General: Skin is warm and dry.  Neurological:     Mental Status: She is alert and oriented to person, place, and time.  Psychiatric:        Mood and Affect: Mood normal.        Behavior: Behavior normal.         Assessment & Plan:  1. 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 Status post decompressive  laminectomy:Chronic Low Back Pain without Sciatica:  Continue to Monitor  Refilled: Oxycodone 10mg /325 mg  one tablet five times a day as needed #135. 01/27/2021 We will continue the opioid monitoring program, this consists of regular clinic visits, examinations, urine drug screen, pill counts as well as use of [place] Controlled Substance Reporting system. A 12 month History has been reviewed on the [place] on 01/27/2021. 2. Insomnia/ Anxiety: Continue  Xanax to 0.5 mg at HS, educated on the [employer], she verbalizes understanding. 01/27/2021 3. Lumbar Radiculopathy/Neuropathic pain: Continue current medication regimen with Gabapentin. 01/27/2021 4. Muscle Spasms. Continue current medication regimen with: Tizanidine. 01/27/2021 5. Neurogenic bowel and bladder. Continue to monitor. 01/27/2021 6. Constipation/Opioid Therapy: Continue current medication regimen with  Linzess. 01/27/2021 7. Depression: Continue current medication regimen with Celexa. Continue to monitor. 01/27/2021 8. Left  Greater Trochanteric Bursitis:  Continue  Heat/Ice Therapy. 01/27/2021 9. Chronic Pain Syndrome: Continue current medication regimen with Voltaren. 01/27/2021 10. Chronic Right Knee Pain: No complaints today. Continue HEP as Tolerated. Continue current medication regimen and continue to monitor. 01/27/2021 11. Cervicalgia/ Cervical Radiculitis: No complaints today. Continue current medication regimen and HEP as tolerated.. Continue to Monitor. 01/27/2021    F/U in 1 month

## 2021-02-07 ENCOUNTER — Ambulatory Visit: Payer: 59 | Admitting: Registered Nurse

## 2021-02-27 ENCOUNTER — Ambulatory Visit: Payer: 59 | Admitting: Registered Nurse

## 2021-02-28 ENCOUNTER — Encounter: Payer: Self-pay | Admitting: Registered Nurse

## 2021-02-28 ENCOUNTER — Encounter: Payer: 59 | Attending: Physical Medicine and Rehabilitation | Admitting: Registered Nurse

## 2021-02-28 ENCOUNTER — Other Ambulatory Visit: Payer: Self-pay

## 2021-02-28 VITALS — BP 136/89 | HR 98 | Temp 97.6°F | Ht 65.0 in | Wt 249.0 lb

## 2021-02-28 DIAGNOSIS — M7061 Trochanteric bursitis, right hip: Secondary | ICD-10-CM | POA: Insufficient documentation

## 2021-02-28 DIAGNOSIS — G894 Chronic pain syndrome: Secondary | ICD-10-CM | POA: Insufficient documentation

## 2021-02-28 DIAGNOSIS — Z5181 Encounter for therapeutic drug level monitoring: Secondary | ICD-10-CM | POA: Insufficient documentation

## 2021-02-28 DIAGNOSIS — Z79891 Long term (current) use of opiate analgesic: Secondary | ICD-10-CM | POA: Insufficient documentation

## 2021-02-28 DIAGNOSIS — M7062 Trochanteric bursitis, left hip: Secondary | ICD-10-CM | POA: Insufficient documentation

## 2021-02-28 DIAGNOSIS — G834 Cauda equina syndrome: Secondary | ICD-10-CM | POA: Insufficient documentation

## 2021-02-28 DIAGNOSIS — M5416 Radiculopathy, lumbar region: Secondary | ICD-10-CM | POA: Insufficient documentation

## 2021-02-28 MED ORDER — OXYCODONE-ACETAMINOPHEN 10-325 MG PO TABS: ORAL_TABLET | ORAL | 0 refills | Status: DC

## 2021-02-28 NOTE — Progress Notes (Signed)
Subjective:    Patient ID: [name], female    DOB: Sep 10, 1970, 50 y.o.   MRN: [id number]  HPI: [name] is a 50 y.o. female who returns for follow up appointment for chronic pain and medication refill. She states her pain is located in her lower back radiating into her left hip. She rates her pain 10. Her current exercise regime is walking and performing stretching exercises.  [name] Morphine equivalent is 62.50 MME.   Last UDS was Performed on 12/30/2020, it was consistent.     Pain Inventory Average Pain 9 Pain Right Now 10 My pain is sharp, dull, stabbing, tingling, and aching  In the last 24 hours, has pain interfered with the following? General activity 9 Relation with others 9 Enjoyment of life 9 What TIME of day is your pain at its worst? morning , daytime, evening, and night Sleep (in general) Poor  Pain is worse with: walking, bending, sitting, and standing Pain improves with: rest, heat/ice, therapy/exercise, and medication Relief from Meds: 2  Family History  Problem Relation Age of Onset   Hypertension Maternal Grandmother    Anesthesia problems Neg Hx    Social History   Socioeconomic History   Marital status: Divorced    Spouse name: Not on file   Number of children: Not on file   Years of education: Not on file   Highest education level: Not on file  Occupational History   Not on file  Tobacco Use   Smoking status: Never   Smokeless tobacco: Never  Vaping Use   Vaping Use: Never used  Substance and Sexual Activity   Alcohol use: No   Drug use: No   Sexual activity: Not Currently    Birth control/protection: None    Comment: IUD removed 2 wks ago.  Other Topics Concern   Not on file  Social History Narrative   Not on file   Social Determinants of Health   Financial Resource Strain: Not on file  Food Insecurity: Not on file  Transportation Needs: Not on file  Physical Activity: Not on file  Stress: Not on file  Social  Connections: Not on file   Past Surgical History:  Procedure Laterality Date   ABDOMINAL HYSTERECTOMY  06/29/11   Robotic Assisted Hysterectomy   CERVICAL SPINE SURGERY  [place]  08/31/2011   Procedure: MICRODISCECTOMY LUMBAR LAMINECTOMY;  Surgeon: Tobi Bastos, MD;  [facility];  Service: Orthopedics;  Laterality: N/A;  lumbar five sacral one central microdisectomy   MYOMECTOMY  13   NECK SURGERY     POSTERIOR CERVICAL FUSION/FORAMINOTOMY N/A 02/02/2013   Procedure: POSTERIOR C5-6 SPINAL FUSION;  Surgeon: Melina Schools, MD;  Location: [place];  Service: Orthopedics;  Laterality: N/A;   POSTERIOR FUSION CERVICAL SPINE  02/02/2013   C 5 C6     [name]   WISDOM TOOTH EXTRACTION     Past Surgical History:  Procedure Laterality Date   ABDOMINAL HYSTERECTOMY  06/29/11   Robotic Assisted Hysterectomy   CERVICAL SPINE SURGERY  10   CESAREAN SECTION  1996   LUMBAR LAMINECTOMY  08/31/2011   Procedure: MICRODISCECTOMY LUMBAR LAMINECTOMY;  Surgeon: Tobi Bastos, MD;  [facility];  Service: Orthopedics;  Laterality: N/A;  lumbar five sacral one central microdisectomy   MYOMECTOMY  13   NECK SURGERY     POSTERIOR CERVICAL FUSION/FORAMINOTOMY N/A 02/02/2013   Procedure: POSTERIOR C5-6 SPINAL FUSION;  Surgeon: Melina Schools, MD;  Location: [place];  Service: Orthopedics;  Laterality: N/A;   POSTERIOR FUSION CERVICAL SPINE  02/02/2013   C 5 C6     [name]   WISDOM TOOTH EXTRACTION     Past Medical History:  Diagnosis Date   Anemia    hx   Arthritis    Complication of anesthesia    slow to wake up,   very difficult to get iv had picc line last time   Difficult intravenous access    hands are usually best for blood draws and IV starts   DVT (deep venous thrombosis) ([place])    ? vs lupus   Fibroids    Headache(784.0)    Neck pain    Scoliosis    BP 136/89   Pulse 98   Temp 97.6 F (36.4 C) (Oral)   Ht 5\' 5"  (1.651 m)   Wt 249 lb  (112.9 kg)   LMP 04/01/2011   SpO2 98%   BMI 41.44 kg/m   Opioid Risk Score:   Fall Risk Score:  `1  Depression screen PHQ 2/9  Depression screen [facility] 2/9 11/22/2020 10/22/2020 09/20/2020 08/21/2020 07/24/2020 02/23/2020 01/23/2020  Decreased Interest 0 0 0 0 0 0 0  Down, Depressed, Hopeless 0 0 - 0 0 0 0  PHQ - 2 Score 0 0 0 0 0 0 0  Altered sleeping - - 0 - - - -  Tired, decreased energy - - - - - - -  Change in appetite - - - - - - -  Feeling bad or failure about yourself  - - - - - - -  Trouble concentrating - - - - - - -  Moving slowly or fidgety/restless - - - - - - -  Suicidal thoughts - - - - - - -  PHQ-9 Score - - 0 - - - -  Difficult doing work/chores - - - - - - -     Review of Systems  Constitutional: Negative.   HENT: Negative.    Eyes: Negative.   Respiratory: Negative.    Cardiovascular: Negative.   Gastrointestinal: Negative.   Endocrine: Negative.   Genitourinary: Negative.   Musculoskeletal:  Positive for back pain.       Left hip pain  Skin: Negative.   Allergic/Immunologic: Negative.   Neurological: Negative.   Hematological: Negative.   Psychiatric/Behavioral: Negative.        Objective:   Physical Exam Vitals and nursing note reviewed.  Constitutional:      Appearance: Normal appearance.  Cardiovascular:     Rate and Rhythm: Normal rate and regular rhythm.     Pulses: Normal pulses.     Heart sounds: Normal heart sounds.  Pulmonary:     Effort: Pulmonary effort is normal.     Breath sounds: Normal breath sounds.  Musculoskeletal:     Cervical back: Normal range of motion and neck supple.     Comments: Normal Muscle Bulk and Muscle Testing Reveals:  Upper Extremities: Full ROM and Muscle Strength 5/5  Lumbar Hypersensitivity Left Greater Trochanter Tenderness Lower Extremities: Full ROM and Muscle Strength 5/5 Arises from Table slowly using cane for support Antalgic  Gait     Skin:    General: Skin is warm and dry.  Neurological:     Mental  Status: She is alert and oriented to person, place, and time.  Psychiatric:        Mood and Affect: Mood normal.  Behavior: Behavior normal.         Assessment & Plan:  1. 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 Status post decompressive laminectomy:Chronic Low Back Pain without Sciatica:  Continue to Monitor  Refilled: Oxycodone 10mg /325 mg one tablet five times a day as needed #135. 02/28/2021 We will continue the opioid monitoring program, this consists of regular clinic visits, examinations, urine drug screen, pill counts as well as use of [place] Controlled Substance Reporting system. A 12 month History has been reviewed on the [place] on 02/28/2021. 2. Insomnia/ Anxiety: Continue  Xanax to 0.5 mg at HS, educated on the [employer], she verbalizes understanding. 02/28/2021 3. Lumbar Radiculopathy/Neuropathic pain: Continue current medication regimen with Gabapentin. 02/28/2021 4. Muscle Spasms. Continue current medication regimen with: Tizanidine. 02/28/2021 5. Neurogenic bowel and bladder. Continue to monitor. 02/28/2021 6. Constipation/Opioid Therapy: Continue current medication regimen with  Linzess. 02/28/2021 7. Depression: Continue current medication regimen with Celexa. Continue to monitor. 02/28/2021 8. Left  Greater Trochanteric Bursitis:  Continue  Heat/Ice Therapy. 02/28/2021 9. Chronic Pain Syndrome: Continue current medication regimen with Voltaren. 02/28/2021 10. Chronic Right Knee Pain: No complaints today. Continue HEP as Tolerated. Continue current medication regimen and continue to monitor. 02/28/2021 11. Cervicalgia/ Cervical Radiculitis: No complaints today. Continue current medication regimen and HEP as tolerated.. Continue to Monitor. 02/28/2021    F/U in 1 month

## 2021-03-10 ENCOUNTER — Ambulatory Visit: Payer: 59 | Admitting: Registered Nurse

## 2021-03-28 ENCOUNTER — Encounter: Payer: Self-pay | Admitting: Registered Nurse

## 2021-03-28 ENCOUNTER — Other Ambulatory Visit: Payer: Self-pay

## 2021-03-28 ENCOUNTER — Encounter: Payer: 59 | Attending: Physical Medicine and Rehabilitation | Admitting: Registered Nurse

## 2021-03-28 VITALS — BP 158/92 | HR 101 | Ht 65.0 in | Wt 250.2 lb

## 2021-03-28 DIAGNOSIS — M7062 Trochanteric bursitis, left hip: Secondary | ICD-10-CM

## 2021-03-28 DIAGNOSIS — M7061 Trochanteric bursitis, right hip: Secondary | ICD-10-CM

## 2021-03-28 DIAGNOSIS — M5416 Radiculopathy, lumbar region: Secondary | ICD-10-CM | POA: Diagnosis present

## 2021-03-28 DIAGNOSIS — Z5181 Encounter for therapeutic drug level monitoring: Secondary | ICD-10-CM

## 2021-03-28 DIAGNOSIS — G834 Cauda equina syndrome: Secondary | ICD-10-CM | POA: Diagnosis not present

## 2021-03-28 DIAGNOSIS — Z79891 Long term (current) use of opiate analgesic: Secondary | ICD-10-CM | POA: Diagnosis present

## 2021-03-28 DIAGNOSIS — G894 Chronic pain syndrome: Secondary | ICD-10-CM | POA: Diagnosis present

## 2021-03-28 DIAGNOSIS — M62830 Muscle spasm of back: Secondary | ICD-10-CM | POA: Diagnosis present

## 2021-03-28 DIAGNOSIS — F411 Generalized anxiety disorder: Secondary | ICD-10-CM

## 2021-03-28 MED ORDER — OXYCODONE-ACETAMINOPHEN 10-325 MG PO TABS: ORAL_TABLET | ORAL | 0 refills | Status: DC

## 2021-03-28 NOTE — Progress Notes (Signed)
Subjective:    Patient ID: [name], female    DOB: 1970-04-15, 51 y.o.   MRN: [id number]  HPI: [name] is a 51 y.o. female who returns for follow up appointment for chronic pain and medication refill. She states her pain is located in her  lower back pain radiating into his left hip. He rates his pain 9. Her current exercise regime is walking and performing stretching exercises.  [name] Morphine equivalent is 67.50 MME. She is also prescribed Alprazolam . We have discussed the black box warning of using opioids and benzodiazepines. I highlighted the dangers of using these drugs together and discussed the adverse events including respiratory suppression, overdose, cognitive impairment and importance of compliance with current regimen. We will continue to monitor and adjust as indicated.    Last UDS was Performed on 12/30/2020, it was consistent.   Pain Inventory Average Pain 9 Pain Right Now 9 My pain is dull, stabbing, tingling, and aching  In the last 24 hours, has pain interfered with the following? General activity 9 Relation with others 9 Enjoyment of life 9 What TIME of day is your pain at its worst? morning , daytime, evening, and night Sleep (in general) Poor  Pain is worse with: walking, bending, sitting, standing, and some activites Pain improves with: rest, heat/ice, and medication Relief from Meds: 3  Family History  Problem Relation Age of Onset   Hypertension Maternal Grandmother    Anesthesia problems Neg Hx    Social History   Socioeconomic History   Marital status: Divorced    Spouse name: Not on file   Number of children: Not on file   Years of education: Not on file   Highest education level: Not on file  Occupational History   Not on file  Tobacco Use   Smoking status: Never   Smokeless tobacco: Never  Vaping Use   Vaping Use: Never used  Substance and Sexual Activity   Alcohol use: No   Drug use: No   Sexual activity: Not  Currently    Birth control/protection: None    Comment: IUD removed 2 wks ago.  Other Topics Concern   Not on file  Social History Narrative   Not on file   Social Determinants of Health   Financial Resource Strain: Not on file  Food Insecurity: Not on file  Transportation Needs: Not on file  Physical Activity: Not on file  Stress: Not on file  Social Connections: Not on file   Past Surgical History:  Procedure Laterality Date   ABDOMINAL HYSTERECTOMY  06/29/11   Robotic Assisted Hysterectomy   CERVICAL SPINE SURGERY  [place]  08/31/2011   Procedure: MICRODISCECTOMY LUMBAR LAMINECTOMY;  Surgeon: Tobi Bastos, MD;  [facility];  Service: Orthopedics;  Laterality: N/A;  lumbar five sacral one central microdisectomy   MYOMECTOMY  13   NECK SURGERY     POSTERIOR CERVICAL FUSION/FORAMINOTOMY N/A 02/02/2013   Procedure: POSTERIOR C5-6 SPINAL FUSION;  Surgeon: Melina Schools, MD;  Location: [place];  Service: Orthopedics;  Laterality: N/A;   POSTERIOR FUSION CERVICAL SPINE  02/02/2013   C 5 C6     [name]   WISDOM TOOTH EXTRACTION     Past Surgical History:  Procedure Laterality Date   ABDOMINAL HYSTERECTOMY  06/29/11   Robotic Assisted Hysterectomy   CERVICAL SPINE SURGERY  10   CESAREAN SECTION  1996   LUMBAR LAMINECTOMY  08/31/2011   Procedure: MICRODISCECTOMY LUMBAR LAMINECTOMY;  Surgeon: Tobi Bastos, MD;  [facility];  Service: Orthopedics;  Laterality: N/A;  lumbar five sacral one central microdisectomy   MYOMECTOMY  13   NECK SURGERY     POSTERIOR CERVICAL FUSION/FORAMINOTOMY N/A 02/02/2013   Procedure: POSTERIOR C5-6 SPINAL FUSION;  Surgeon: Melina Schools, MD;  Location: [place];  Service: Orthopedics;  Laterality: N/A;   POSTERIOR FUSION CERVICAL SPINE  02/02/2013   C 5 C6     [name]   WISDOM TOOTH EXTRACTION     Past Medical History:  Diagnosis Date   Anemia    hx   Arthritis    Complication of anesthesia     slow to wake up,   very difficult to get iv had picc line last time   Difficult intravenous access    hands are usually best for blood draws and IV starts   DVT (deep venous thrombosis) ([place])    ? vs lupus   Fibroids    Headache(784.0)    Neck pain    Scoliosis    BP (!) 158/92    Pulse (!) 101    Ht 5\' 5"  (1.651 m)    Wt 250 lb 3.2 oz (113.5 kg)    LMP 04/01/2011    SpO2 97%    BMI 41.64 kg/m   Opioid Risk Score:   Fall Risk Score:  `1  Depression screen PHQ 2/9  Depression screen [facility] 2/9 03/28/2021 02/28/2021 11/22/2020 10/22/2020 09/20/2020 08/21/2020 07/24/2020  Decreased Interest 0 0 0 0 0 0 0  Down, Depressed, Hopeless 0 0 0 0 - 0 0  PHQ - 2 Score 0 0 0 0 0 0 0  Altered sleeping - 0 - - 0 - -  Tired, decreased energy - - - - - - -  Change in appetite - - - - - - -  Feeling bad or failure about yourself  - - - - - - -  Trouble concentrating - - - - - - -  Moving slowly or fidgety/restless - - - - - - -  Suicidal thoughts - - - - - - -  PHQ-9 Score - 0 - - 0 - -  Difficult doing work/chores - - - - - - -  Some recent data might be hidden      Review of Systems  Musculoskeletal:  Positive for back pain.  All other systems reviewed and are negative.     Objective:   Physical Exam Vitals and nursing note reviewed.  Constitutional:      Appearance: Normal appearance.  Cardiovascular:     Rate and Rhythm: Normal rate and regular rhythm.     Pulses: Normal pulses.     Heart sounds: Normal heart sounds.  Pulmonary:     Effort: Pulmonary effort is normal.     Breath sounds: Normal breath sounds.  Musculoskeletal:     Cervical back: Normal range of motion and neck supple.     Comments: Normal Muscle Bulk and Muscle Testing Reveals:  Upper Extremities: Full ROM and Muscle Strength 5/5 Lumbar Paraspinal Tenderness: L-3-L-5 Left Greater Trochanter Tenderness Lower Extremities: Full ROM and Muscle Strength 5/5 Arises from Table Slowly using cane for support Antalgic  Gait      Skin:    General: Skin is warm and dry.  Neurological:     Mental Status: She is alert and oriented to person, place, and time.  Psychiatric:  Mood and Affect: Mood normal.        Behavior: Behavior normal.         Assessment & Plan:  1. 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 Status post decompressive laminectomy:Chronic Low Back Pain without Sciatica:  Continue to Monitor  Refilled: Oxycodone 10mg /325 mg one tablet five times a day as needed #135. 03/28/2021 We will continue the opioid monitoring program, this consists of regular clinic visits, examinations, urine drug screen, pill counts as well as use of [place] Controlled Substance Reporting system. A 12 month History has been reviewed on the [place] on 03/28/2021. 2. Insomnia/ Anxiety: Continue  Xanax to 0.5 mg at HS, educated on the [employer], she verbalizes understanding. 03/28/2021 3. Lumbar Radiculopathy/Neuropathic pain: Continue current medication regimen with Gabapentin. 03/28/2021 4. Muscle Spasms. Continue current medication regimen with: Tizanidine. 03/28/2021 5. Neurogenic bowel and bladder. Continue to monitor. 03/28/2021 6. Constipation/Opioid Therapy: Continue current medication regimen with  Linzess. 03/28/2021 7. Depression: Continue current medication regimen with Celexa. Continue to monitor. 03/28/2021 8. Left  Greater Trochanteric Bursitis:  Continue  Heat/Ice Therapy. 03/28/2021 9. Chronic Pain Syndrome: Continue current medication regimen with Voltaren. 03/28/2021 10. Chronic Right Knee Pain: No complaints today. Continue HEP as Tolerated. Continue current medication regimen and continue to monitor. 03/28/2021 11. Cervicalgia/ Cervical Radiculitis: No complaints today. Continue current medication regimen and HEP as tolerated.. Continue to Monitor. 03/28/2021    F/U in 1 month

## 2021-04-08 ENCOUNTER — Other Ambulatory Visit: Payer: Self-pay | Admitting: Registered Nurse

## 2021-04-08 DIAGNOSIS — G834 Cauda equina syndrome: Secondary | ICD-10-CM

## 2021-04-08 DIAGNOSIS — M7062 Trochanteric bursitis, left hip: Secondary | ICD-10-CM

## 2021-04-08 MED ORDER — GABAPENTIN 300 MG PO CAPS: ORAL_CAPSULE | ORAL | 3 refills | Status: DC

## 2021-04-28 ENCOUNTER — Encounter: Payer: 59 | Attending: Physical Medicine and Rehabilitation | Admitting: Registered Nurse

## 2021-04-28 ENCOUNTER — Encounter: Payer: Self-pay | Admitting: Registered Nurse

## 2021-04-28 ENCOUNTER — Other Ambulatory Visit: Payer: Self-pay

## 2021-04-28 VITALS — BP 139/92 | HR 93 | Ht 65.0 in | Wt 250.4 lb

## 2021-04-28 DIAGNOSIS — F411 Generalized anxiety disorder: Secondary | ICD-10-CM | POA: Insufficient documentation

## 2021-04-28 DIAGNOSIS — M7062 Trochanteric bursitis, left hip: Secondary | ICD-10-CM | POA: Insufficient documentation

## 2021-04-28 DIAGNOSIS — G894 Chronic pain syndrome: Secondary | ICD-10-CM | POA: Insufficient documentation

## 2021-04-28 DIAGNOSIS — G834 Cauda equina syndrome: Secondary | ICD-10-CM | POA: Insufficient documentation

## 2021-04-28 DIAGNOSIS — Z79891 Long term (current) use of opiate analgesic: Secondary | ICD-10-CM | POA: Insufficient documentation

## 2021-04-28 DIAGNOSIS — F3289 Other specified depressive episodes: Secondary | ICD-10-CM | POA: Diagnosis present

## 2021-04-28 DIAGNOSIS — F5101 Primary insomnia: Secondary | ICD-10-CM | POA: Diagnosis present

## 2021-04-28 DIAGNOSIS — M7061 Trochanteric bursitis, right hip: Secondary | ICD-10-CM | POA: Diagnosis present

## 2021-04-28 DIAGNOSIS — M62830 Muscle spasm of back: Secondary | ICD-10-CM | POA: Insufficient documentation

## 2021-04-28 DIAGNOSIS — R202 Paresthesia of skin: Secondary | ICD-10-CM | POA: Insufficient documentation

## 2021-04-28 DIAGNOSIS — M5416 Radiculopathy, lumbar region: Secondary | ICD-10-CM | POA: Insufficient documentation

## 2021-04-28 DIAGNOSIS — Z5181 Encounter for therapeutic drug level monitoring: Secondary | ICD-10-CM | POA: Diagnosis not present

## 2021-04-28 MED ORDER — OXYCODONE-ACETAMINOPHEN 10-325 MG PO TABS: ORAL_TABLET | ORAL | 0 refills | Status: DC

## 2021-04-28 MED ORDER — ALPRAZOLAM 0.5 MG PO TABS: 0.5000 mg | ORAL_TABLET | Freq: Every day | ORAL | 2 refills | Status: DC | PRN

## 2021-04-28 MED ORDER — DICLOFENAC SODIUM 75 MG PO TBEC: 75.0000 mg | DELAYED_RELEASE_TABLET | Freq: Two times a day (BID) | ORAL | 3 refills | Status: DC

## 2021-04-28 NOTE — Progress Notes (Signed)
Subjective:    Patient ID: [name], female    DOB: 1970-05-27, 51 y.o.   MRN: [id number]  HPI: [name] is a 51 y.o. female who returns for follow up appointment for chronic pain and medication refill. She states her pain is located in her lower back radiating into her left hip and right lower extremity pain with tingling and numbness at times. She rates her pain 8. Her current exercise regime is walking and performing stretching exercises.  [name] states her mother was admitted to hospital and  was diagnosed with Brain Tumor, emotional support given.   [name] Morphine equivalent is 65.00  MME. She  is also prescribed Alprazolam .We have discussed the black box warning of using opioids and benzodiazepines. I highlighted the dangers of using these drugs together and discussed the adverse events including respiratory suppression, overdose, cognitive impairment and importance of compliance with current regimen. We will continue to monitor and adjust as indicated.       UDS ordered today.     Pain Inventory Average Pain 9 Pain Right Now 8 My pain is aching  In the last 24 hours, has pain interfered with the following? General activity 9 Relation with others 9 Enjoyment of life 9 What TIME of day is your pain at its worst? morning , daytime, evening, and night Sleep (in general) Poor  Pain is worse with: walking, bending, sitting, and standing Pain improves with: rest, heat/ice, therapy/exercise, pacing activities, and medication Relief from Meds: 2  Family History  Problem Relation Age of Onset   Hypertension Maternal Grandmother    Anesthesia problems Neg Hx    Social History   Socioeconomic History   Marital status: Divorced    Spouse name: Not on file   Number of children: Not on file   Years of education: Not on file   Highest education level: Not on file  Occupational History   Not on file  Tobacco Use   Smoking status: Never   Smokeless tobacco:  Never  Vaping Use   Vaping Use: Never used  Substance and Sexual Activity   Alcohol use: No   Drug use: No   Sexual activity: Not Currently    Birth control/protection: None    Comment: IUD removed 2 wks ago.  Other Topics Concern   Not on file  Social History Narrative   Not on file   Social Determinants of Health   Financial Resource Strain: Not on file  Food Insecurity: Not on file  Transportation Needs: Not on file  Physical Activity: Not on file  Stress: Not on file  Social Connections: Not on file   Past Surgical History:  Procedure Laterality Date   ABDOMINAL HYSTERECTOMY  06/29/11   Robotic Assisted Hysterectomy   CERVICAL SPINE SURGERY  [place]  08/31/2011   Procedure: MICRODISCECTOMY LUMBAR LAMINECTOMY;  Surgeon: Tobi Bastos, MD;  [facility];  Service: Orthopedics;  Laterality: N/A;  lumbar five sacral one central microdisectomy   MYOMECTOMY  13   NECK SURGERY     POSTERIOR CERVICAL FUSION/FORAMINOTOMY N/A 02/02/2013   Procedure: POSTERIOR C5-6 SPINAL FUSION;  Surgeon: Melina Schools, MD;  Location: [place];  Service: Orthopedics;  Laterality: N/A;   POSTERIOR FUSION CERVICAL SPINE  02/02/2013   C 5 C6     [name]   WISDOM TOOTH EXTRACTION     Past Surgical History:  Procedure Laterality Date  ABDOMINAL HYSTERECTOMY  06/29/11   Robotic Assisted Hysterectomy   CERVICAL SPINE SURGERY  [place]   LUMBAR LAMINECTOMY  08/31/2011   Procedure: MICRODISCECTOMY LUMBAR LAMINECTOMY;  Surgeon: Tobi Bastos, MD;  [facility];  Service: Orthopedics;  Laterality: N/A;  lumbar five sacral one central microdisectomy   MYOMECTOMY  13   NECK SURGERY     POSTERIOR CERVICAL FUSION/FORAMINOTOMY N/A 02/02/2013   Procedure: POSTERIOR C5-6 SPINAL FUSION;  Surgeon: Melina Schools, MD;  Location: [place];  Service: Orthopedics;  Laterality: N/A;   POSTERIOR FUSION CERVICAL SPINE  02/02/2013   C 5 C6     Dr  Rolena Infante   WISDOM TOOTH EXTRACTION     Past Medical History:  Diagnosis Date   Anemia    hx   Arthritis    Complication of anesthesia    slow to wake up,   very difficult to get iv had picc line last time   Difficult intravenous access    hands are usually best for blood draws and IV starts   DVT (deep venous thrombosis) ([place])    ? vs lupus   Fibroids    Headache(784.0)    Neck pain    Scoliosis    BP (!) 139/92    Pulse 93    Ht 5\' 5"  (1.651 m)    Wt 250 lb 6.4 oz (113.6 kg)    LMP 04/01/2011    SpO2 95%    BMI 41.67 kg/m   Opioid Risk Score:   Fall Risk Score:  `1  Depression screen PHQ 2/9  Depression screen [name] 2/9 03/28/2021 02/28/2021 11/22/2020 10/22/2020 09/20/2020 08/21/2020 07/24/2020  Decreased Interest 0 0 0 0 0 0 0  Down, Depressed, Hopeless 0 0 0 0 - 0 0  PHQ - 2 Score 0 0 0 0 0 0 0  Altered sleeping - 0 - - 0 - -  Tired, decreased energy - - - - - - -  Change in appetite - - - - - - -  Feeling bad or failure about yourself  - - - - - - -  Trouble concentrating - - - - - - -  Moving slowly or fidgety/restless - - - - - - -  Suicidal thoughts - - - - - - -  PHQ-9 Score - 0 - - 0 - -  Difficult doing work/chores - - - - - - -  Some recent data might be hidden     Review of Systems  Musculoskeletal:  Positive for back pain.       Hip pain  All other systems reviewed and are negative.     Objective:   Physical Exam Vitals and nursing note reviewed.  Constitutional:      Appearance: Normal appearance.  Cardiovascular:     Rate and Rhythm: Normal rate and regular rhythm.     Pulses: Normal pulses.     Heart sounds: Normal heart sounds.  Pulmonary:     Effort: Pulmonary effort is normal.     Breath sounds: Normal breath sounds.  Musculoskeletal:     Cervical back: Normal range of motion and neck supple.     Comments: Normal Muscle Bulk and Muscle Testing Reveals:  Upper Extremities: Full ROM and Muscle Strength 5/5 Lumbar Paraspinal Tenderness: L-3-L-5 Left  Greater Trochanter Tenderness Lower Extremities: Full ROM  and Muscle Strength 5/5 Arises from chair slowly Narrow Based  Gait     Skin:  General: Skin is warm and dry.  Neurological:     Mental Status: She is alert and oriented to person, place, and time.  Psychiatric:        Mood and Affect: Mood normal.        Behavior: Behavior normal.         Assessment & Plan:  1. 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 Status post decompressive laminectomy:Chronic Low Back Pain without Sciatica:  Continue to Monitor  Refilled: Oxycodone 10mg /325 mg one tablet five times a day as needed #135. 04/28/2021 We will continue the opioid monitoring program, this consists of regular clinic visits, examinations, urine drug screen, pill counts as well as use of [place] Controlled Substance Reporting system. A 12 month History has been reviewed on the [place] on 04/28/2021. 2. Insomnia/ Anxiety: Continue  Xanax to 0.5 mg at HS, educated on the [employer], she verbalizes understanding. 04/28/2021 3. Lumbar Radiculopathy/Neuropathic pain: Continue current medication regimen with Gabapentin. 03/28/2021 4. Muscle Spasms. Continue current medication regimen with: Tizanidine. 04/28/2021 5. Neurogenic bowel and bladder. Continue to monitor. 04/28/2021 6. Constipation/Opioid Therapy: Continue current medication regimen with  Linzess. 04/28/2021 7. Depression: Continue current medication regimen with Celexa. Continue to monitor. 04/28/2021 8. Left  Greater Trochanteric Bursitis:  Continue  Heat/Ice Therapy. 04/28/2021 9. Chronic Pain Syndrome: Continue current medication regimen with Voltaren. 04/28/2021 10. Chronic Right Knee Pain: No complaints today. Continue HEP as Tolerated. Continue current medication regimen and continue to monitor. 04/28/2021 11. Cervicalgia/ Cervical Radiculitis: No complaints today. Continue  current medication regimen and HEP as tolerated.. Continue to Monitor. 04/28/2021    F/U in 1 month

## 2021-05-07 LAB — TOXASSURE SELECT,+ANTIDEPR,UR

## 2021-05-09 ENCOUNTER — Telehealth: Payer: Self-pay | Admitting: *Deleted

## 2021-05-09 NOTE — Telephone Encounter (Signed)
Urine drug screen for this encounter is consistent for prescribed medication 

## 2021-05-23 ENCOUNTER — Encounter: Payer: Self-pay | Admitting: Registered Nurse

## 2021-05-23 ENCOUNTER — Other Ambulatory Visit: Payer: Self-pay

## 2021-05-23 ENCOUNTER — Encounter: Payer: 59 | Attending: Physical Medicine and Rehabilitation | Admitting: Registered Nurse

## 2021-05-23 VITALS — BP 138/81 | HR 91 | Ht 65.0 in | Wt 244.0 lb

## 2021-05-23 DIAGNOSIS — R202 Paresthesia of skin: Secondary | ICD-10-CM | POA: Diagnosis present

## 2021-05-23 DIAGNOSIS — F3289 Other specified depressive episodes: Secondary | ICD-10-CM | POA: Diagnosis present

## 2021-05-23 DIAGNOSIS — M5416 Radiculopathy, lumbar region: Secondary | ICD-10-CM | POA: Diagnosis present

## 2021-05-23 DIAGNOSIS — M62830 Muscle spasm of back: Secondary | ICD-10-CM | POA: Diagnosis present

## 2021-05-23 DIAGNOSIS — G834 Cauda equina syndrome: Secondary | ICD-10-CM | POA: Diagnosis present

## 2021-05-23 DIAGNOSIS — M7061 Trochanteric bursitis, right hip: Secondary | ICD-10-CM | POA: Insufficient documentation

## 2021-05-23 DIAGNOSIS — M7062 Trochanteric bursitis, left hip: Secondary | ICD-10-CM | POA: Insufficient documentation

## 2021-05-23 DIAGNOSIS — F411 Generalized anxiety disorder: Secondary | ICD-10-CM | POA: Insufficient documentation

## 2021-05-23 MED ORDER — OXYCODONE-ACETAMINOPHEN 10-325 MG PO TABS: ORAL_TABLET | ORAL | 0 refills | Status: DC

## 2021-05-23 NOTE — Progress Notes (Signed)
? ?Subjective:  ? ? Patient ID: [name], female    DOB: [birth year], 51 y.o.   MRN: [id number] ? ?HPI: [name] is a 51 y.o. female who returns for follow up appointment for chronic pain and medication refill. She states her pain is located in her lower back and left hip. She rates her pain 9. Her current exercise regime is walking and performing stretching exercises. ? ?Ms. Thornton Morphine equivalent is 72.32 MME. She is also prescribed Alprazolam  .We have discussed the black box warning of using opioids and benzodiazepines. I highlighted the dangers of using these drugs together and discussed the adverse events including respiratory suppression, overdose, cognitive impairment and importance of compliance with current regimen. We will continue to monitor and adjust as indicated.  ? ?Last UDS was Performed on 04/28/2021, it was consistent.  ?  ? ?Pain Inventory ?Average Pain 9 ?Pain Right Now 9 ?My pain is sharp, dull, tingling, and aching ? ?In the last 24 hours, has pain interfered with the following? ?General activity 8 ?Relation with others 8 ?Enjoyment of life 8 ?What TIME of day is your pain at its worst? morning , daytime, evening, and night ?Sleep (in general) Poor ? ?Pain is worse with: walking, bending, sitting, and standing ?Pain improves with: rest, heat/ice, and medication ?Relief from Meds: 2 ? ?Family History  ?Problem Relation Age of Onset  ? Hypertension Maternal Grandmother   ? Anesthesia problems Neg Hx   ? ?Social History  ? ?Socioeconomic History  ? Marital status: Divorced  ?  Spouse name: Not on file  ? Number of children: Not on file  ? Years of education: Not on file  ? Highest education level: Not on file  ?Occupational History  ? Not on file  ?Tobacco Use  ? Smoking status: Never  ? Smokeless tobacco: Never  ?Vaping Use  ? Vaping Use: Never used  ?Substance and Sexual Activity  ? Alcohol use: No  ? Drug use: No  ? Sexual activity: Not Currently  ?  Birth control/protection: None   ?  Comment: IUD removed 2 wks ago.  ?Other Topics Concern  ? Not on file  ?Social History Narrative  ? Not on file  ? ?Social Determinants of Health  ? ?Financial Resource Strain: Not on file  ?Food Insecurity: Not on file  ?Transportation Needs: Not on file  ?Physical Activity: Not on file  ?Stress: Not on file  ?Social Connections: Not on file  ? ?Past Surgical History:  ?Procedure Laterality Date  ? ABDOMINAL HYSTERECTOMY  06/29/11  ? Robotic Assisted Hysterectomy  ? CERVICAL SPINE SURGERY  10  ? [place]  ? LUMBAR LAMINECTOMY  08/31/2011  ? Procedure: MICRODISCECTOMY LUMBAR LAMINECTOMY;  Surgeon: Tobi Bastos, MD;  [facility]: [facility];  Service: Orthopedics;  Laterality: N/A;  lumbar five sacral one central microdisectomy  ? MYOMECTOMY  13  ? NECK SURGERY    ? POSTERIOR CERVICAL FUSION/FORAMINOTOMY N/A 02/02/2013  ? Procedure: POSTERIOR C5-6 SPINAL FUSION;  Surgeon: Melina Schools, MD;  Location: [place];  Service: Orthopedics;  Laterality: N/A;  ? POSTERIOR FUSION CERVICAL SPINE  02/02/2013  ? C 5 C6     [name]  ? WISDOM TOOTH EXTRACTION    ? ?Past Surgical History:  ?Procedure Laterality Date  ? ABDOMINAL HYSTERECTOMY  06/29/11  ? Robotic Assisted Hysterectomy  ? CERVICAL SPINE SURGERY  10  ? [place]  ? LUMBAR LAMINECTOMY  08/31/2011  ? Procedure: MICRODISCECTOMY  LUMBAR LAMINECTOMY;  Surgeon: Tobi Bastos, MD;  [facility];  Service: Orthopedics;  Laterality: N/A;  lumbar five sacral one central microdisectomy  ? MYOMECTOMY  13  ? NECK SURGERY    ? POSTERIOR CERVICAL FUSION/FORAMINOTOMY N/A 02/02/2013  ? Procedure: POSTERIOR C5-6 SPINAL FUSION;  Surgeon: Melina Schools, MD;  Location: [place];  Service: Orthopedics;  Laterality: N/A;  ? POSTERIOR FUSION CERVICAL SPINE  02/02/2013  ? C 5 C6     [name]  ? WISDOM TOOTH EXTRACTION    ? ?Past Medical History:  ?Diagnosis Date  ? Anemia   ? hx  ? Arthritis   ? Complication of anesthesia   ? slow to wake up,   very difficult to get  iv had picc line last time  ? Difficult intravenous access   ? hands are usually best for blood draws and IV starts  ? DVT (deep venous thrombosis) ([place])   ? ? vs lupus  ? Fibroids   ? Headache(784.0)   ? Neck pain   ? Scoliosis   ? ?BP 138/81   Pulse 91   Ht 5\' 5"  (1.651 m)   Wt 244 lb (110.7 kg)   LMP 04/01/2011   SpO2 98%   BMI 40.60 kg/m?  ? ?Opioid Risk Score:   ?Fall Risk Score:  `1 ? ?Depression screen PHQ 2/9 ? ?Depression screen [facility] 2/9 03/28/2021 02/28/2021 11/22/2020 10/22/2020 09/20/2020 08/21/2020 07/24/2020  ?Decreased Interest 0 0 0 0 0 0 0  ?Down, Depressed, Hopeless 0 0 0 0 - 0 0  ?PHQ - 2 Score 0 0 0 0 0 0 0  ?Altered sleeping - 0 - - 0 - -  ?Tired, decreased energy - - - - - - -  ?Change in appetite - - - - - - -  ?Feeling bad or failure about yourself  - - - - - - -  ?Trouble concentrating - - - - - - -  ?Moving slowly or fidgety/restless - - - - - - -  ?Suicidal thoughts - - - - - - -  ?PHQ-9 Score - 0 - - 0 - -  ?Difficult doing work/chores - - - - - - -  ?Some recent data might be hidden  ?  ? ?Review of Systems  ?Musculoskeletal:  Positive for back pain.  ?     Hip pain  ?All other systems reviewed and are negative. ? ?   ?Objective:  ? Physical Exam ?Vitals and nursing note reviewed.  ?Constitutional:   ?   Appearance: Normal appearance.  ?Cardiovascular:  ?   Rate and Rhythm: Normal rate and regular rhythm.  ?   Pulses: Normal pulses.  ?   Heart sounds: Normal heart sounds.  ?Pulmonary:  ?   Effort: Pulmonary effort is normal.  ?   Breath sounds: Normal breath sounds.  ?Musculoskeletal:  ?   Cervical back: Normal range of motion and neck supple.  ?   Comments: Normal Muscle Bulk and Muscle Testing Reveals:  ?Upper Extremities: Full ROM and Muscle Strength 5/5  ?Lumbar Paraspinal Tenderness: L-3-L-5 ?Left Greater Trochanter Tenderness ?Lower Extremities: Full ROM and Muscle Strength 5/5 ?Arises from Table slowly using cane for support ?Antalgic  Gait  ?   ?Skin: ?   General: Skin is warm and dry.   ?Neurological:  ?   Mental Status: She is alert and oriented to person, place, and time.  ?Psychiatric:     ?   Mood and Affect:  Mood normal.     ?   Behavior: Behavior normal.  ? ? ? ? ?   ?Assessment & Plan:  ?1. 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 Status post decompressive laminectomy:Chronic Low Back Pain without Sciatica:  Continue to Monitor  Refilled: Oxycodone 10mg /325 mg one tablet five times a day as needed #135. 05/23/2021 ?We will continue the opioid monitoring program, this consists of regular clinic visits, examinations, urine drug screen, pill counts as well as use of [place] Controlled Substance Reporting system. A 12 month History has been reviewed on the [place] Controlled Substance Reporting System on 05/23/2021. ?2. Insomnia/ Anxiety: Continue  Xanax to 0.5 mg at HS, educated on the [employer], she verbalizes understanding. 05/23/2021 ?3. Lumbar Radiculopathy/Neuropathic pain: Continue current medication regimen with Gabapentin. 05/23/2021 ?4. Muscle Spasms. Continue current medication regimen with: Tizanidine. 05/23/2021 ?5. Neurogenic bowel and bladder. Continue to monitor. 05/23/2021 ?6. Constipation/Opioid Therapy: Continue current medication regimen with  Linzess. 05/23/2021 ?7. Depression: Continue current medication regimen with Celexa. Continue to monitor. 05/23/2021 ?8. Left  Greater Trochanteric Bursitis:  Continue  Heat/Ice Therapy. 05/23/2021 ?9. Chronic Pain Syndrome: Continue current medication regimen with Voltaren. 05/23/2021 ?10. Chronic Right Knee Pain: No complaints today. Continue HEP as Tolerated. Continue current medication regimen and continue to monitor. 05/23/2021 ?11. Cervicalgia/ Cervical Radiculitis: No complaints today. Continue current medication regimen and HEP as tolerated.. Continue to Monitor. 05/23/2021 ?   ?F/U in 1 month ?  ?  ? ?

## 2021-05-30 ENCOUNTER — Telehealth: Payer: Self-pay

## 2021-05-30 MED ORDER — TIZANIDINE HCL 4 MG PO TABS: ORAL_TABLET | ORAL | 3 refills | Status: DC

## 2021-05-30 NOTE — Telephone Encounter (Signed)
Refill request for Tizanidine ?

## 2021-07-08 ENCOUNTER — Encounter: Payer: Self-pay | Admitting: Registered Nurse

## 2021-07-08 ENCOUNTER — Encounter: Payer: 59 | Attending: Physical Medicine and Rehabilitation | Admitting: Registered Nurse

## 2021-07-08 VITALS — BP 133/91 | HR 88 | Ht 65.0 in | Wt 247.0 lb

## 2021-07-08 DIAGNOSIS — G834 Cauda equina syndrome: Secondary | ICD-10-CM | POA: Diagnosis present

## 2021-07-08 DIAGNOSIS — M5416 Radiculopathy, lumbar region: Secondary | ICD-10-CM | POA: Diagnosis present

## 2021-07-08 DIAGNOSIS — F3289 Other specified depressive episodes: Secondary | ICD-10-CM | POA: Diagnosis present

## 2021-07-08 DIAGNOSIS — F5101 Primary insomnia: Secondary | ICD-10-CM | POA: Diagnosis present

## 2021-07-08 DIAGNOSIS — M7061 Trochanteric bursitis, right hip: Secondary | ICD-10-CM | POA: Diagnosis present

## 2021-07-08 DIAGNOSIS — M62830 Muscle spasm of back: Secondary | ICD-10-CM

## 2021-07-08 DIAGNOSIS — M7062 Trochanteric bursitis, left hip: Secondary | ICD-10-CM | POA: Diagnosis present

## 2021-07-08 DIAGNOSIS — F411 Generalized anxiety disorder: Secondary | ICD-10-CM

## 2021-07-08 MED ORDER — GABAPENTIN 300 MG PO CAPS: ORAL_CAPSULE | ORAL | 3 refills | Status: DC

## 2021-07-08 MED ORDER — OXYCODONE-ACETAMINOPHEN 10-325 MG PO TABS: ORAL_TABLET | ORAL | 0 refills | Status: DC

## 2021-07-08 NOTE — Progress Notes (Signed)
? ?Subjective:  ? ? Patient ID: [name], female    DOB: Feb 24, 1971, 51 y.o.   MRN: [id number] ? ?HPI: [name] is a 51 y.o. female who returns for follow up appointment for chronic pain and medication refill. She states her pain is located in her lower back radiating into her bilateral lower extremities and bilateral hip pain. She. rates her pain 8. Her current exercise regime is walking and performing stretching exercises. ? ?Ms. Goddard Morphine equivalent is 75.00 MME.  She is also prescribed Alprazolam .We have discussed the black box warning of using opioids and benzodiazepines. I highlighted the dangers of using these drugs together and discussed the adverse events including respiratory suppression, overdose, cognitive impairment and importance of compliance with current regimen. We will continue to monitor and adjust as indicated.  ? ?Last UDS was Performed on 04/28/2021, it was consistent.  ?  ? ?Pain Inventory ?Average Pain 8 ?Pain Right Now 8 ?My pain is sharp, dull, stabbing, tingling, and aching ? ?In the last 24 hours, has pain interfered with the following? ?General activity 8 ?Relation with others 8 ?Enjoyment of life 8 ?What TIME of day is your pain at its worst? morning , daytime, evening, and night ?Sleep (in general) Poor ? ?Pain is worse with: walking, bending, sitting, and standing ?Pain improves with: rest, heat/ice, therapy/exercise, and medication ?Relief from Meds: 2 ? ?Family History  ?Problem Relation Age of Onset  ? Hypertension Maternal Grandmother   ? Anesthesia problems Neg Hx   ? ?Social History  ? ?Socioeconomic History  ? Marital status: Divorced  ?  Spouse name: Not on file  ? Number of children: Not on file  ? Years of education: Not on file  ? Highest education level: Not on file  ?Occupational History  ? Not on file  ?Tobacco Use  ? Smoking status: Never  ? Smokeless tobacco: Never  ?Vaping Use  ? Vaping Use: Never used  ?Substance and Sexual Activity  ? Alcohol use:  No  ? Drug use: No  ? Sexual activity: Not Currently  ?  Birth control/protection: None  ?  Comment: IUD removed 2 wks ago.  ?Other Topics Concern  ? Not on file  ?Social History Narrative  ? Not on file  ? ?Social Determinants of Health  ? ?Financial Resource Strain: Not on file  ?Food Insecurity: Not on file  ?Transportation Needs: Not on file  ?Physical Activity: Not on file  ?Stress: Not on file  ?Social Connections: Not on file  ? ?Past Surgical History:  ?Procedure Laterality Date  ? ABDOMINAL HYSTERECTOMY  06/29/11  ? Robotic Assisted Hysterectomy  ? CERVICAL SPINE SURGERY  10  ? [place]  ? LUMBAR LAMINECTOMY  08/31/2011  ? Procedure: MICRODISCECTOMY LUMBAR LAMINECTOMY;  Surgeon: Tobi Bastos, MD;  [facility];  Service: Orthopedics;  Laterality: N/A;  lumbar five sacral one central microdisectomy  ? MYOMECTOMY  13  ? NECK SURGERY    ? POSTERIOR CERVICAL FUSION/FORAMINOTOMY N/A 02/02/2013  ? Procedure: POSTERIOR C5-6 SPINAL FUSION;  Surgeon: Melina Schools, MD;  Location: [place];  Service: Orthopedics;  Laterality: N/A;  ? POSTERIOR FUSION CERVICAL SPINE  02/02/2013  ? C 5 C6     [name]  ? WISDOM TOOTH EXTRACTION    ? ?Past Surgical History:  ?Procedure Laterality Date  ? ABDOMINAL HYSTERECTOMY  06/29/11  ? Robotic Assisted Hysterectomy  ? CERVICAL SPINE SURGERY  10  ? [place]  ?  LUMBAR LAMINECTOMY  08/31/2011  ? Procedure: MICRODISCECTOMY LUMBAR LAMINECTOMY;  Surgeon: Tobi Bastos, MD;  [facility];  Service: Orthopedics;  Laterality: N/A;  lumbar five sacral one central microdisectomy  ? MYOMECTOMY  13  ? NECK SURGERY    ? POSTERIOR CERVICAL FUSION/FORAMINOTOMY N/A 02/02/2013  ? Procedure: POSTERIOR C5-6 SPINAL FUSION;  Surgeon: Melina Schools, MD;  Location: [place];  Service: Orthopedics;  Laterality: N/A;  ? POSTERIOR FUSION CERVICAL SPINE  02/02/2013  ? C 5 C6     [name]  ? WISDOM TOOTH EXTRACTION    ? ?Past Medical History:  ?Diagnosis Date  ? Anemia   ? hx  ?  Arthritis   ? Complication of anesthesia   ? slow to wake up,   very difficult to get iv had picc line last time  ? Difficult intravenous access   ? hands are usually best for blood draws and IV starts  ? DVT (deep venous thrombosis) ([place])   ? ? vs lupus  ? Fibroids   ? Headache(784.0)   ? Neck pain   ? Scoliosis   ? ?BP (!) 133/91   Pulse 88   Ht '5\' 5"'$  (1.651 m)   Wt 247 lb (112 kg)   LMP 04/01/2011   SpO2 98%   BMI 41.10 kg/m?  ? ?Opioid Risk Score:   ?Fall Risk Score:  `1 ? ?Depression screen PHQ 2/9 ? ? ?  07/08/2021  ? 11:27 AM 03/28/2021  ? 11:26 AM 02/28/2021  ? 11:32 AM 11/22/2020  ? 11:53 AM 10/22/2020  ? 11:41 AM 09/20/2020  ? 11:17 AM 08/21/2020  ? 11:20 AM  ?Depression screen PHQ 2/9  ?Decreased Interest 0 0 0 0 0 0 0  ?Down, Depressed, Hopeless 0 0 0 0 0  0  ?PHQ - 2 Score 0 0 0 0 0 0 0  ?Altered sleeping   0   0   ?PHQ-9 Score   0   0   ?  ? ?Review of Systems  ?Constitutional: Negative.   ?HENT: Negative.    ?Eyes: Negative.   ?Respiratory: Negative.    ?Cardiovascular: Negative.   ?Gastrointestinal: Negative.   ?Endocrine: Negative.   ?Genitourinary: Negative.   ?Musculoskeletal:  Positive for gait problem.  ?Skin: Negative.   ?Allergic/Immunologic: Negative.   ?Hematological: Negative.   ?Psychiatric/Behavioral:  Positive for sleep disturbance.   ? ?   ?Objective:  ? Physical Exam ?Vitals and nursing note reviewed.  ?Constitutional:   ?   Appearance: Normal appearance. She is obese.  ?Cardiovascular:  ?   Rate and Rhythm: Normal rate and regular rhythm.  ?   Pulses: Normal pulses.  ?   Heart sounds: Normal heart sounds.  ?Pulmonary:  ?   Effort: Pulmonary effort is normal.  ?   Breath sounds: Normal breath sounds.  ?Musculoskeletal:  ?   Cervical back: Normal range of motion and neck supple.  ?   Comments: Normal Muscle Bulk and Muscle Testing Reveals: ? Upper Extremities: Full ROM and Muscle Strength 5/5 ?Lumbar Hypersensitivity ?Greater Trochanter Tenderness ?Lower Extremities: Decreased ROM and Muscle  Strength 5/5 ?Bilateral Lower Extremities Flexion Produces Pain into her Bilateral Lower Extremities ?Arises from Table slowly using cane for support ?Antalgic Gait   ?Skin: ?   General: Skin is warm and dry.  ?Neurological:  ?   Mental Status: She is alert and oriented to person, place, and time.  ?Psychiatric:     ?   Mood and Affect:  Mood normal.     ?   Behavior: Behavior normal.  ? ? ? ? ?   ?Assessment & Plan:  ?1. 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 Status post decompressive laminectomy:Chronic Low Back Pain without Sciatica:  Continue to Monitor  Refilled: Oxycodone '10mg'$ /325 mg one tablet five times a day as needed #135. 07/08/2021 ?We will continue the opioid monitoring program, this consists of regular clinic visits, examinations, urine drug screen, pill counts as well as use of [place] Controlled Substance Reporting system. A 12 month History has been reviewed on the [place] Controlled Substance Reporting System on 07/08/2021. ?2. Insomnia/ Anxiety: Continue  Xanax to 0.5 mg at HS, educated on the [employer], she verbalizes understanding. 07/08/2021 ?3. Lumbar Radiculopathy/Neuropathic pain: Continue current medication regimen with Gabapentin. 07/08/2021 ?4. Muscle Spasms. Continue current medication regimen with: Tizanidine. 07/08/2021 ?5. Neurogenic bowel and bladder. Continue to monitor. 07/08/2021 ?6. Constipation/Opioid Therapy: Continue current medication regimen with  Linzess. 07/08/2021 ?7. Depression: Continue current medication regimen with Celexa. Continue to monitor. 07/08/2021 ?8. Left  Greater Trochanteric Bursitis:  Continue  Heat/Ice Therapy. 07/08/2021 ?9. Chronic Pain Syndrome: Continue current medication regimen with Voltaren. 07/08/2021 ?10. Chronic Right Knee Pain: No complaints today. Continue HEP as Tolerated. Continue current medication regimen and continue to monitor. 07/08/2021 ?11. Cervicalgia/ Cervical Radiculitis: No  complaints today. Continue current medication regimen and HEP as tolerated.. Continue to Monitor. 07/08/2021 ?   ?F/U in 1 month ?  ?  ? ?

## 2021-08-19 ENCOUNTER — Encounter: Payer: Self-pay | Admitting: Registered Nurse

## 2021-08-19 ENCOUNTER — Encounter: Payer: 59 | Attending: Physical Medicine and Rehabilitation | Admitting: Registered Nurse

## 2021-08-19 VITALS — BP 145/87 | HR 79 | Ht 65.0 in | Wt 247.4 lb

## 2021-08-19 DIAGNOSIS — F411 Generalized anxiety disorder: Secondary | ICD-10-CM | POA: Diagnosis present

## 2021-08-19 DIAGNOSIS — M7061 Trochanteric bursitis, right hip: Secondary | ICD-10-CM | POA: Insufficient documentation

## 2021-08-19 DIAGNOSIS — Z5181 Encounter for therapeutic drug level monitoring: Secondary | ICD-10-CM | POA: Diagnosis present

## 2021-08-19 DIAGNOSIS — Z79891 Long term (current) use of opiate analgesic: Secondary | ICD-10-CM | POA: Insufficient documentation

## 2021-08-19 DIAGNOSIS — F3289 Other specified depressive episodes: Secondary | ICD-10-CM | POA: Insufficient documentation

## 2021-08-19 DIAGNOSIS — M7062 Trochanteric bursitis, left hip: Secondary | ICD-10-CM | POA: Insufficient documentation

## 2021-08-19 DIAGNOSIS — M5416 Radiculopathy, lumbar region: Secondary | ICD-10-CM | POA: Insufficient documentation

## 2021-08-19 DIAGNOSIS — F5101 Primary insomnia: Secondary | ICD-10-CM | POA: Insufficient documentation

## 2021-08-19 DIAGNOSIS — G894 Chronic pain syndrome: Secondary | ICD-10-CM | POA: Diagnosis present

## 2021-08-19 DIAGNOSIS — M62830 Muscle spasm of back: Secondary | ICD-10-CM | POA: Diagnosis present

## 2021-08-19 DIAGNOSIS — G834 Cauda equina syndrome: Secondary | ICD-10-CM | POA: Diagnosis present

## 2021-08-19 MED ORDER — OXYCODONE-ACETAMINOPHEN 10-325 MG PO TABS: ORAL_TABLET | ORAL | 0 refills | Status: DC

## 2021-08-19 MED ORDER — ALPRAZOLAM 0.5 MG PO TABS: 0.5000 mg | ORAL_TABLET | Freq: Every day | ORAL | 2 refills | Status: DC | PRN

## 2021-08-19 MED ORDER — DICLOFENAC SODIUM 75 MG PO TBEC: 75.0000 mg | DELAYED_RELEASE_TABLET | Freq: Two times a day (BID) | ORAL | 3 refills | Status: DC

## 2021-08-19 MED ORDER — CITALOPRAM HYDROBROMIDE 10 MG PO TABS: 10.0000 mg | ORAL_TABLET | Freq: Every day | ORAL | 3 refills | Status: DC

## 2021-08-19 MED ORDER — TIZANIDINE HCL 4 MG PO TABS: ORAL_TABLET | ORAL | 3 refills | Status: DC

## 2021-08-19 NOTE — Progress Notes (Signed)
Subjective:    Patient ID: [name], female    DOB: 07-16-1970, 51 y.o.   MRN: [id number]  HPI: [name] is a 51 y.o. female who returns for follow up appointment for chronic pain and medication refill. She states her  pain is located in her lower back radiating into her bilateral hips and bilateral lower extremities. She rates her pain 10. Her current exercise regime is walking she was encouraged to increase her HEP as tolerated, she verbalizes understanding.   Ms. [place] Morphine equivalent is 75.00 MME. She  is also prescribed Alprazolam .We have discussed the black box warning of using opioids and benzodiazepines. I highlighted the dangers of using these drugs together and discussed the adverse events including respiratory suppression, overdose, cognitive impairment and importance of compliance with current regimen. We will continue to monitor and adjust as indicated.   Last UDS was Performed on 04/28/2021, it was consistent.     Pain Inventory Average Pain 9 Pain Right Now 10 My pain is constant, dull, stabbing, tingling, and aching  In the last 24 hours, has pain interfered with the following? General activity 9 Relation with others 9 Enjoyment of life 9 What TIME of day is your pain at its worst? morning , daytime, evening, and night Sleep (in general) Poor  Pain is worse with: walking, bending, sitting, and standing Pain improves with: rest, heat/ice, therapy/exercise, and medication Relief from Meds: 2  Family History  Problem Relation Age of Onset   Hypertension Maternal Grandmother    Anesthesia problems Neg Hx    Social History   Socioeconomic History   Marital status: Divorced    Spouse name: Not on file   Number of children: Not on file   Years of education: Not on file   Highest education level: Not on file  Occupational History   Not on file  Tobacco Use   Smoking status: Never   Smokeless tobacco: Never  Vaping Use   Vaping Use: Never used   Substance and Sexual Activity   Alcohol use: No   Drug use: No   Sexual activity: Not Currently    Birth control/protection: None    Comment: IUD removed 2 wks ago.  Other Topics Concern   Not on file  Social History Narrative   Not on file   Social Determinants of Health   Financial Resource Strain: Not on file  Food Insecurity: Not on file  Transportation Needs: Not on file  Physical Activity: Not on file  Stress: Not on file  Social Connections: Not on file   Past Surgical History:  Procedure Laterality Date   ABDOMINAL HYSTERECTOMY  06/29/11   Robotic Assisted Hysterectomy   CERVICAL SPINE SURGERY  [place]  08/31/2011   Procedure: MICRODISCECTOMY LUMBAR LAMINECTOMY;  Surgeon: Tobi Bastos, MD;  [facility];  Service: Orthopedics;  Laterality: N/A;  lumbar five sacral one central microdisectomy   MYOMECTOMY  13   NECK SURGERY     POSTERIOR CERVICAL FUSION/FORAMINOTOMY N/A 02/02/2013   Procedure: POSTERIOR C5-6 SPINAL FUSION;  Surgeon: Melina Schools, MD;  Location: [place];  Service: Orthopedics;  Laterality: N/A;   POSTERIOR FUSION CERVICAL SPINE  02/02/2013   C 5 C6     [name]   WISDOM TOOTH EXTRACTION     Past Surgical History:  Procedure Laterality Date   ABDOMINAL HYSTERECTOMY  06/29/11   Robotic Assisted Hysterectomy   CERVICAL SPINE SURGERY  [place]  08/31/2011   Procedure: MICRODISCECTOMY LUMBAR LAMINECTOMY;  Surgeon: Tobi Bastos, MD;  [facility];  Service: Orthopedics;  Laterality: N/A;  lumbar five sacral one central microdisectomy   MYOMECTOMY  13   NECK SURGERY     POSTERIOR CERVICAL FUSION/FORAMINOTOMY N/A 02/02/2013   Procedure: POSTERIOR C5-6 SPINAL FUSION;  Surgeon: Melina Schools, MD;  Location: [place];  Service: Orthopedics;  Laterality: N/A;   POSTERIOR FUSION CERVICAL SPINE  02/02/2013   C 5 C6     [name]   WISDOM TOOTH EXTRACTION     Past  Medical History:  Diagnosis Date   Anemia    hx   Arthritis    Complication of anesthesia    slow to wake up,   very difficult to get iv had picc line last time   Difficult intravenous access    hands are usually best for blood draws and IV starts   DVT (deep venous thrombosis) ([place])    ? vs lupus   Fibroids    Headache(784.0)    Neck pain    Scoliosis    BP (!) 145/87   Pulse 79   Ht '5\' 5"'$  (1.651 m)   Wt 247 lb 6.4 oz (112.2 kg)   LMP 04/01/2011   SpO2 99%   BMI 41.17 kg/m   Opioid Risk Score:   Fall Risk Score:  `1  Depression screen [facility] 2/9     07/08/2021   11:27 AM 03/28/2021   11:26 AM 02/28/2021   11:32 AM 11/22/2020   11:53 AM 10/22/2020   11:41 AM 09/20/2020   11:17 AM 08/21/2020   11:20 AM  Depression screen PHQ 2/9  Decreased Interest 0 0 0 0 0 0 0  Down, Depressed, Hopeless 0 0 0 0 0  0  PHQ - 2 Score 0 0 0 0 0 0 0  Altered sleeping   0   0   PHQ-9 Score   0   0      Review of Systems  Constitutional: Negative.   HENT: Negative.    Eyes: Negative.   Respiratory: Negative.    Cardiovascular: Negative.   Gastrointestinal:  Positive for abdominal pain.  Endocrine: Negative.   Genitourinary: Negative.   Musculoskeletal:  Positive for back pain and gait problem.  Skin: Negative.   Allergic/Immunologic: Negative.   Hematological: Negative.   Psychiatric/Behavioral:  Positive for sleep disturbance.       Objective:   Physical Exam Vitals and nursing note reviewed.  Constitutional:      Appearance: Normal appearance. She is obese.  Cardiovascular:     Rate and Rhythm: Normal rate and regular rhythm.     Pulses: Normal pulses.     Heart sounds: Normal heart sounds.  Pulmonary:     Effort: Pulmonary effort is normal.     Breath sounds: Normal breath sounds.  Musculoskeletal:     Cervical back: Normal range of motion and neck supple.     Comments: Normal Muscle Bulk and Muscle Testing Reveals:  Upper Extremities: Full ROM and Muscle Strength 5/5   Lumbar Paraspinal Tenderness: L-3-L-5 Bilateral Greater Trochanter Tenderness Lower Extremities: Full ROM and Muscle Strength 5/5 Arises from Table Slowly Antalgic  Gait     Skin:    General: Skin is warm and dry.  Neurological:     Mental Status: She is alert and oriented to person, place, and time.  Psychiatric:  Mood and Affect: Mood normal.        Behavior: Behavior normal.         Assessment & Plan:  1. 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 Status post decompressive laminectomy:Chronic Low Back Pain without Sciatica:  Continue to Monitor  Refilled: Oxycodone '10mg'$ /325 mg one tablet five times a day as needed #135. Second script sent for the following month. 08/19/2021 We will continue the opioid monitoring program, this consists of regular clinic visits, examinations, urine drug screen, pill counts as well as use of [place] Controlled Substance Reporting system. A 12 month History has been reviewed on the [place] on 08/19/2021. 2. Insomnia/ Anxiety: Continue  Xanax to 0.5 mg at HS, educated on the [employer], she verbalizes understanding. 08/19/2021 3. Lumbar Radiculopathy/Neuropathic pain: Continue current medication regimen with Gabapentin. 08/19/2021 4. Muscle Spasms. Continue current medication regimen with: Tizanidine. 08/19/2021 5. Neurogenic bowel and bladder. Continue to monitor. 08/19/2021 6. Constipation/Opioid Therapy: Continue current medication regimen with  Linzess. 08/19/2021 7. Depression: Continue current medication regimen with Celexa. Continue to monitor. 08/19/2021 8. Left  Greater Trochanteric Bursitis:  Continue  Heat/Ice Therapy. 08/19/2021 9. Chronic Pain Syndrome: Continue current medication regimen with Voltaren. 08/19/2021 10. Chronic Right Knee Pain: No complaints today. Continue HEP as Tolerated. Continue current medication regimen and continue to monitor.  08/19/2021 11. Cervicalgia/ Cervical Radiculitis: No complaints today. Continue current medication regimen and HEP as tolerated.. Continue to Monitor. 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

## 2021-10-14 ENCOUNTER — Encounter: Payer: 59 | Admitting: Registered Nurse

## 2021-10-15 ENCOUNTER — Ambulatory Visit (HOSPITAL_COMMUNITY)
Admission: RE | Admit: 2021-10-15 | Discharge: 2021-10-15 | Disposition: A | Discharge status: Home / Self Care | Payer: 59 | Source: Ambulatory Visit | Attending: Registered Nurse | Admitting: Registered Nurse

## 2021-10-15 ENCOUNTER — Encounter: Payer: 59 | Attending: Physical Medicine and Rehabilitation | Admitting: Registered Nurse

## 2021-10-15 VITALS — BP 139/89 | HR 81 | Ht 65.0 in | Wt 252.0 lb

## 2021-10-15 DIAGNOSIS — M25511 Pain in right shoulder: Secondary | ICD-10-CM

## 2021-10-15 DIAGNOSIS — M5416 Radiculopathy, lumbar region: Secondary | ICD-10-CM

## 2021-10-15 DIAGNOSIS — M7061 Trochanteric bursitis, right hip: Secondary | ICD-10-CM

## 2021-10-15 DIAGNOSIS — M79601 Pain in right arm: Secondary | ICD-10-CM

## 2021-10-15 DIAGNOSIS — M5412 Radiculopathy, cervical region: Secondary | ICD-10-CM

## 2021-10-15 DIAGNOSIS — M25421 Effusion, right elbow: Secondary | ICD-10-CM

## 2021-10-15 DIAGNOSIS — Z5181 Encounter for therapeutic drug level monitoring: Secondary | ICD-10-CM | POA: Diagnosis not present

## 2021-10-15 DIAGNOSIS — Z79891 Long term (current) use of opiate analgesic: Secondary | ICD-10-CM | POA: Diagnosis not present

## 2021-10-15 DIAGNOSIS — F411 Generalized anxiety disorder: Secondary | ICD-10-CM

## 2021-10-15 DIAGNOSIS — M542 Cervicalgia: Secondary | ICD-10-CM | POA: Diagnosis present

## 2021-10-15 DIAGNOSIS — M62830 Muscle spasm of back: Secondary | ICD-10-CM

## 2021-10-15 DIAGNOSIS — G894 Chronic pain syndrome: Secondary | ICD-10-CM

## 2021-10-15 DIAGNOSIS — M7062 Trochanteric bursitis, left hip: Secondary | ICD-10-CM

## 2021-10-15 DIAGNOSIS — G834 Cauda equina syndrome: Secondary | ICD-10-CM

## 2021-10-15 MED ORDER — OXYCODONE-ACETAMINOPHEN 10-325 MG PO TABS: ORAL_TABLET | ORAL | 0 refills | Status: DC

## 2021-10-15 NOTE — Progress Notes (Signed)
Right UE venous duplex study completed. Please see CV Proc for preliminary results.  Denica Web BS, RVT 10/15/2021 2:49 PM

## 2021-10-15 NOTE — Progress Notes (Signed)
Subjective:    Patient ID: [name], female    DOB: 21-Sep-1970, 51 y.o.   MRN: [id number]  HPI: [name] is a 51 y.o. female who returns for follow up appointment for chronic pain and medication refill. She states her  pain is located in her right arm with swelling, she reports the pain started on Sunday, she denies fall., venous duplex ordered, she verbalizes understanding. She also reports she has pain in her neck radiating into her right shoulder and lower back pain radiating into her left hip. She rates her pain 10. Her current exercise regime is walking with her cane.   [name] states her mother is transitioning, she is under hospice care at this time. Emotional support given.   [name] Morphine equivalent is 75.00 MME.   UDS ordered today.     Pain Inventory Average Pain 9 Pain Right Now 10 My pain is sharp, burning, stabbing, tingling, and aching  In the last 24 hours, has pain interfered with the following? General activity 9 Relation with others 9 Enjoyment of life 9 What TIME of day is your pain at its worst? morning , daytime, evening, and night Sleep (in general) Poor  Pain is worse with: walking, bending, sitting, standing, and some activites Pain improves with: rest, heat/ice, and medication Relief from Meds: 1  Family History  Problem Relation Age of Onset   Hypertension Maternal Grandmother    Anesthesia problems Neg Hx    Social History   Socioeconomic History   Marital status: Divorced    Spouse name: Not on file   Number of children: Not on file   Years of education: Not on file   Highest education level: Not on file  Occupational History   Not on file  Tobacco Use   Smoking status: Never   Smokeless tobacco: Never  Vaping Use   Vaping Use: Never used  Substance and Sexual Activity   Alcohol use: No   Drug use: No   Sexual activity: Not Currently    Birth control/protection: None    Comment: IUD removed 2 wks ago.  Other  Topics Concern   Not on file  Social History Narrative   Not on file   Social Determinants of Health   Financial Resource Strain: Not on file  Food Insecurity: Not on file  Transportation Needs: Not on file  Physical Activity: Not on file  Stress: Not on file  Social Connections: Not on file   Past Surgical History:  Procedure Laterality Date   ABDOMINAL HYSTERECTOMY  06/29/11   Robotic Assisted Hysterectomy   CERVICAL SPINE SURGERY  [place]  08/31/2011   Procedure: MICRODISCECTOMY LUMBAR LAMINECTOMY;  Surgeon: Tobi Bastos, MD;  Location: [facility];  Service: Orthopedics;  Laterality: N/A;  lumbar five sacral one central microdisectomy   MYOMECTOMY  13   NECK SURGERY     POSTERIOR CERVICAL FUSION/FORAMINOTOMY N/A 02/02/2013   Procedure: POSTERIOR C5-6 SPINAL FUSION;  Surgeon: Melina Schools, MD;  Location: [place];  Service: Orthopedics;  Laterality: N/A;   POSTERIOR FUSION CERVICAL SPINE  02/02/2013   C 5 C6     [name]   WISDOM TOOTH EXTRACTION     Past Surgical History:  Procedure Laterality Date   ABDOMINAL HYSTERECTOMY  06/29/11   Robotic Assisted Hysterectomy   CERVICAL SPINE SURGERY  10   CESAREAN SECTION  1996   LUMBAR LAMINECTOMY  08/31/2011  Procedure: MICRODISCECTOMY LUMBAR LAMINECTOMY;  Surgeon: Tobi Bastos, MD;  Location: [facility];  Service: Orthopedics;  Laterality: N/A;  lumbar five sacral one central microdisectomy   MYOMECTOMY  13   NECK SURGERY     POSTERIOR CERVICAL FUSION/FORAMINOTOMY N/A 02/02/2013   Procedure: POSTERIOR C5-6 SPINAL FUSION;  Surgeon: Melina Schools, MD;  Location: [place];  Service: Orthopedics;  Laterality: N/A;   POSTERIOR FUSION CERVICAL SPINE  02/02/2013   C 5 C6     [name]   WISDOM TOOTH EXTRACTION     Past Medical History:  Diagnosis Date   Anemia    hx   Arthritis    Complication of anesthesia    slow to wake up,   very difficult to get iv had picc line last time   Difficult  intravenous access    hands are usually best for blood draws and IV starts   DVT (deep venous thrombosis) ([place])    ? vs lupus   Fibroids    Headache(784.0)    Neck pain    Scoliosis    BP 139/89   Pulse 81   Ht '5\' 5"'$  (1.651 m)   Wt 252 lb (114.3 kg)   LMP 02/27/2011   SpO2 98%   BMI 41.93 kg/m   Opioid Risk Score:   Fall Risk Score:  `1  Depression screen [facility] 2/9     10/15/2021   11:27 AM 07/08/2021   11:27 AM 03/28/2021   11:26 AM 02/28/2021   11:32 AM 11/22/2020   11:53 AM 10/22/2020   11:41 AM 09/20/2020   11:17 AM  Depression screen [facility] 2/9  Decreased Interest 0 0 0 0 0 0 0  Down, Depressed, Hopeless 0 0 0 0 0 0   PHQ - 2 Score 0 0 0 0 0 0 0  Altered sleeping    0   0  PHQ-9 Score    0   0     Review of Systems  Musculoskeletal:  Positive for back pain.       Right arm pain Left hip pain  All other systems reviewed and are negative.     Objective:   Physical Exam Vitals and nursing note reviewed.  Constitutional:      Appearance: Normal appearance. She is obese.  Neck:     Comments: Cervical Paraspinal Tenderness: C-5-C-6 Cardiovascular:     Rate and Rhythm: Normal rate and regular rhythm.     Pulses: Normal pulses.     Heart sounds: Normal heart sounds.  Pulmonary:     Effort: Pulmonary effort is normal.     Breath sounds: Normal breath sounds.  Musculoskeletal:     Cervical back: Normal range of motion and neck supple.     Comments: Normal Muscle Bulk and Muscle Testing Reveals:  Upper Extremities: Right: Decreased ROM 20 Degrees and Muscle Strength 4/5 Swelling noted with warmth Right AC Joint Tenderness Left Upper Extremity: Full ROM and Muscle Strength 5/5 Lumbar Hypersensitivity Left Greter Trochanter Tenderness Lower Extremities: Full ROM and Muscle Strength 5/5 Arises from Table slowly Antalgic  Gait     Skin:    General: Skin is warm and dry.  Neurological:     Mental Status: She is alert and oriented to person, place, and time.   Psychiatric:        Mood and Affect: Mood normal.        Behavior: Behavior normal.         Assessment & Plan:  1. 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 Status post decompressive laminectomy:Chronic Low Back Pain without Sciatica:  Continue to Monitor  Refilled: Oxycodone '10mg'$ /325 mg one tablet five times a day as needed #135. Second script sent for the following month. 10/15/2021 We will continue the opioid monitoring program, this consists of regular clinic visits, examinations, urine drug screen, pill counts as well as use of [place] Controlled Substance Reporting system. A 12 month History has been reviewed on the [place] on 10/15/2021. 2. Insomnia/ Anxiety: Continue  Xanax to 0.5 mg at HS, educated on the [employer], she verbalizes understanding. 10/15/2021 3. Lumbar Radiculopathy/Neuropathic pain: Continue current medication regimen with Gabapentin. 10/15/2021 4. Muscle Spasms. Continue current medication regimen with: Tizanidine. 10/15/2021 5. Neurogenic bowel and bladder. Continue to monitor. 10/15/2021 6. Constipation/Opioid Therapy: Continue current medication regimen with  Linzess. 10/15/2021 7. Depression: Continue current medication regimen with Celexa. Continue to monitor. 10/15/2021 8. Left  Greater Trochanteric Bursitis:  Continue  Heat/Ice Therapy. 10/15/2021 9. Chronic Pain Syndrome: Continue current medication regimen with Voltaren. 10/15/2021 10. Chronic Right Knee Pain: No complaints today. Continue HEP as Tolerated. Continue current medication regimen and continue to monitor. 10/15/2021 11. Cervicalgia/ Cervical Radiculitis: No complaints today. Continue current medication regimen and HEP as tolerated.. Continue to Monitor. 10/15/2021  12. Right Arm Pain/ Swelling: RX Venous Duplex. She has a scheduled appointment. Results pending.    F/U in 2 months Ms. Cibrian is the  caregiver for her mother at this time, will allow her to return in two months.

## 2021-10-15 NOTE — Patient Instructions (Addendum)
I have her scheduled at [facility] today to arrive at 2:15 she needs to go to Entrance C Main Entrance for Heart and Vascular  They said it is near Emergency Room on [address]--that is where she needs to go---if she has any questions she can call [phone].

## 2021-10-18 LAB — TOXASSURE SELECT,+ANTIDEPR,UR

## 2021-10-24 ENCOUNTER — Encounter: Payer: Self-pay | Admitting: Registered Nurse

## 2021-10-27 ENCOUNTER — Telehealth: Payer: Self-pay | Admitting: *Deleted

## 2021-10-27 NOTE — Telephone Encounter (Signed)
Urine drug screen for this encounter is consistent for prescribed medication 

## 2021-12-02 ENCOUNTER — Telehealth: Payer: Self-pay | Admitting: Registered Nurse

## 2021-12-02 DIAGNOSIS — M7061 Trochanteric bursitis, right hip: Secondary | ICD-10-CM

## 2021-12-02 MED ORDER — OXYCODONE-ACETAMINOPHEN 10-325 MG PO TABS: ORAL_TABLET | ORAL | 0 refills | Status: DC

## 2021-12-02 NOTE — Telephone Encounter (Signed)
PMP was Reviewed.  Oxycodone e-scribed today.  [name] is aware via My-Chart message

## 2021-12-16 ENCOUNTER — Encounter: Payer: 59 | Attending: Physical Medicine and Rehabilitation | Admitting: Registered Nurse

## 2021-12-16 VITALS — BP 151/93 | HR 87 | Ht 65.0 in | Wt 262.6 lb

## 2021-12-16 DIAGNOSIS — G894 Chronic pain syndrome: Secondary | ICD-10-CM | POA: Diagnosis present

## 2021-12-16 DIAGNOSIS — G834 Cauda equina syndrome: Secondary | ICD-10-CM | POA: Diagnosis present

## 2021-12-16 DIAGNOSIS — M5416 Radiculopathy, lumbar region: Secondary | ICD-10-CM

## 2021-12-16 DIAGNOSIS — Z79891 Long term (current) use of opiate analgesic: Secondary | ICD-10-CM | POA: Diagnosis present

## 2021-12-16 DIAGNOSIS — M5126 Other intervertebral disc displacement, lumbar region: Secondary | ICD-10-CM | POA: Diagnosis present

## 2021-12-16 DIAGNOSIS — M7061 Trochanteric bursitis, right hip: Secondary | ICD-10-CM | POA: Diagnosis present

## 2021-12-16 DIAGNOSIS — Z5181 Encounter for therapeutic drug level monitoring: Secondary | ICD-10-CM | POA: Insufficient documentation

## 2021-12-16 DIAGNOSIS — M62830 Muscle spasm of back: Secondary | ICD-10-CM | POA: Insufficient documentation

## 2021-12-16 DIAGNOSIS — M7062 Trochanteric bursitis, left hip: Secondary | ICD-10-CM

## 2021-12-16 DIAGNOSIS — F411 Generalized anxiety disorder: Secondary | ICD-10-CM | POA: Insufficient documentation

## 2021-12-16 MED ORDER — PROMETHAZINE HCL 12.5 MG PO TABS: 12.5000 mg | ORAL_TABLET | Freq: Four times a day (QID) | ORAL | 3 refills | Status: DC | PRN

## 2021-12-16 MED ORDER — TIZANIDINE HCL 4 MG PO TABS: ORAL_TABLET | ORAL | 3 refills | Status: DC

## 2021-12-16 MED ORDER — OXYCODONE-ACETAMINOPHEN 10-325 MG PO TABS: ORAL_TABLET | ORAL | 0 refills | Status: DC

## 2021-12-16 MED ORDER — ALPRAZOLAM 0.5 MG PO TABS: 0.5000 mg | ORAL_TABLET | Freq: Every day | ORAL | 2 refills | Status: DC | PRN

## 2021-12-16 MED ORDER — DICLOFENAC SODIUM 75 MG PO TBEC: 75.0000 mg | DELAYED_RELEASE_TABLET | Freq: Two times a day (BID) | ORAL | 3 refills | Status: DC

## 2021-12-16 MED ORDER — CITALOPRAM HYDROBROMIDE 10 MG PO TABS: 10.0000 mg | ORAL_TABLET | Freq: Every day | ORAL | 3 refills | Status: DC

## 2021-12-16 MED ORDER — GABAPENTIN 300 MG PO CAPS: ORAL_CAPSULE | ORAL | 3 refills | Status: DC

## 2021-12-16 NOTE — Progress Notes (Unsigned)
Subjective:    Patient ID: [name], female    DOB: 07/12/1970, 51 y.o.   MRN: [id number]  HPI: [name] is a 51 y.o. female who returns for follow up appointment for chronic pain and medication refill. states *** pain is located in  ***. rates pain ***. current exercise regime is walking and performing stretching exercises.  [name] Morphine equivalent is *** MME.   Last UDS was Performed on 10/15/2021, it was consistent.     Pain Inventory Average Pain 9 Pain Right Now 9 My pain is dull, stabbing, tingling, and aching  In the last 24 hours, has pain interfered with the following? General activity 8 Relation with others 9 Enjoyment of life 9 What TIME of day is your pain at its worst? morning , daytime, evening, and night Sleep (in general) Poor  Pain is worse with: walking, bending, sitting, standing, and some activites Pain improves with: rest, heat/ice, therapy/exercise, and medication Relief from Meds: 1  Family History  Problem Relation Age of Onset   Hypertension Maternal Grandmother    Anesthesia problems Neg Hx    Social History   Socioeconomic History   Marital status: Divorced    Spouse name: Not on file   Number of children: Not on file   Years of education: Not on file   Highest education level: Not on file  Occupational History   Not on file  Tobacco Use   Smoking status: Never   Smokeless tobacco: Never  Vaping Use   Vaping Use: Never used  Substance and Sexual Activity   Alcohol use: No   Drug use: No   Sexual activity: Not Currently    Birth control/protection: None    Comment: IUD removed 2 wks ago.  Other Topics Concern   Not on file  Social History Narrative   Not on file   Social Determinants of Health   Financial Resource Strain: Not on file  Food Insecurity: Not on file  Transportation Needs: Not on file  Physical Activity: Not on file  Stress: Not on file  Social Connections: Not on file   Past Surgical History:   Procedure Laterality Date   ABDOMINAL HYSTERECTOMY  06/29/11   Robotic Assisted Hysterectomy   CERVICAL SPINE SURGERY  [place]  08/31/2011   Procedure: MICRODISCECTOMY LUMBAR LAMINECTOMY;  Surgeon: Tobi Bastos, MD;  [facility];  Service: Orthopedics;  Laterality: N/A;  lumbar five sacral one central microdisectomy   MYOMECTOMY  13   NECK SURGERY     POSTERIOR CERVICAL FUSION/FORAMINOTOMY N/A 02/02/2013   Procedure: POSTERIOR C5-6 SPINAL FUSION;  Surgeon: Melina Schools, MD;  Location: [place];  Service: Orthopedics;  Laterality: N/A;   POSTERIOR FUSION CERVICAL SPINE  02/02/2013   C 5 C6     [name]   WISDOM TOOTH EXTRACTION     Past Surgical History:  Procedure Laterality Date   ABDOMINAL HYSTERECTOMY  06/29/11   Robotic Assisted Hysterectomy   CERVICAL SPINE SURGERY  10   CESAREAN SECTION  1996   LUMBAR LAMINECTOMY  08/31/2011   Procedure: MICRODISCECTOMY LUMBAR LAMINECTOMY;  Surgeon: Tobi Bastos, MD;  [facility];  Service: Orthopedics;  Laterality: N/A;  lumbar five sacral one central microdisectomy   MYOMECTOMY  13   NECK SURGERY     POSTERIOR CERVICAL FUSION/FORAMINOTOMY N/A 02/02/2013   Procedure: POSTERIOR C5-6 SPINAL FUSION;  Surgeon: Melina Schools, MD;  Location: [place];  Service: Orthopedics;  Laterality: N/A;   POSTERIOR FUSION CERVICAL SPINE  02/02/2013   C 5 C6     [name]   WISDOM TOOTH EXTRACTION     Past Medical History:  Diagnosis Date   Anemia    hx   Arthritis    Complication of anesthesia    slow to wake up,   very difficult to get iv had picc line last time   Difficult intravenous access    hands are usually best for blood draws and IV starts   DVT (deep venous thrombosis) ([place])    ? vs lupus   Fibroids    Headache(784.0)    Neck pain    Scoliosis    BP (!) 151/93   Pulse 87   Ht '5\' 5"'$  (1.651 m)   Wt 262 lb 9.6 oz (119.1 kg)   LMP 02/27/2011   SpO2 100%   BMI 43.70 kg/m   Opioid  Risk Score:   Fall Risk Score:  `1  Depression screen [facility] 2/9     10/15/2021   11:27 AM 07/08/2021   11:27 AM 03/28/2021   11:26 AM 02/28/2021   11:32 AM 11/22/2020   11:53 AM 10/22/2020   11:41 AM 09/20/2020   11:17 AM  Depression screen PHQ 2/9  Decreased Interest 0 0 0 0 0 0 0  Down, Depressed, Hopeless 0 0 0 0 0 0   PHQ - 2 Score 0 0 0 0 0 0 0  Altered sleeping    0   0  PHQ-9 Score    0   0     Review of Systems  Musculoskeletal:  Positive for back pain.       Bilateral hip pain  All other systems reviewed and are negative.     Objective:   Physical Exam        Assessment & Plan:  1. 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 Status post decompressive laminectomy:Chronic Low Back Pain without Sciatica:  Continue to Monitor  Refilled: Oxycodone '10mg'$ /325 mg one tablet five times a day as needed #135. Second script sent for the following month. 10/15/2021 We will continue the opioid monitoring program, this consists of regular clinic visits, examinations, urine drug screen, pill counts as well as use of [place] Controlled Substance Reporting system. A 12 month History has been reviewed on the [place] on 10/15/2021. 2. Insomnia/ Anxiety: Continue  Xanax to 0.5 mg at HS, educated on the [employer], she verbalizes understanding. 10/15/2021 3. Lumbar Radiculopathy/Neuropathic pain: Continue current medication regimen with Gabapentin. 10/15/2021 4. Muscle Spasms. Continue current medication regimen with: Tizanidine. 10/15/2021 5. Neurogenic bowel and bladder. Continue to monitor. 10/15/2021 6. Constipation/Opioid Therapy: Continue current medication regimen with  Linzess. 10/15/2021 7. Depression: Continue current medication regimen with Celexa. Continue to monitor. 10/15/2021 8. Left  Greater Trochanteric Bursitis:  Continue  Heat/Ice Therapy. 10/15/2021 9. Chronic Pain Syndrome: Continue current  medication regimen with Voltaren. 10/15/2021 10. Chronic Right Knee Pain: No complaints today. Continue HEP as Tolerated. Continue current medication regimen and continue to monitor. 10/15/2021 11. Cervicalgia/ Cervical Radiculitis: No complaints today. Continue current medication regimen and HEP as tolerated.. Continue to Monitor. 10/15/2021  12. Right Arm Pain/ Swelling: RX Venous Duplex. She has a scheduled appointment. Results pending.      F/U in 2 months [name] is the caregiver for her mother at this time, will allow her to return in two months.

## 2021-12-17 ENCOUNTER — Encounter: Payer: Self-pay | Admitting: Registered Nurse

## 2022-02-18 ENCOUNTER — Encounter: Payer: 59 | Admitting: Registered Nurse

## 2022-02-20 ENCOUNTER — Emergency Department (HOSPITAL_BASED_OUTPATIENT_CLINIC_OR_DEPARTMENT_OTHER)
Admission: EM | Admit: 2022-02-20 | Discharge: 2022-02-20 | Disposition: A | Discharge status: Home / Self Care | Payer: 59 | Attending: Emergency Medicine | Admitting: Emergency Medicine

## 2022-02-20 ENCOUNTER — Other Ambulatory Visit: Payer: Self-pay

## 2022-02-20 ENCOUNTER — Emergency Department (HOSPITAL_BASED_OUTPATIENT_CLINIC_OR_DEPARTMENT_OTHER): Payer: 59 | Admitting: Radiology

## 2022-02-20 DIAGNOSIS — J209 Acute bronchitis, unspecified: Secondary | ICD-10-CM | POA: Insufficient documentation

## 2022-02-20 DIAGNOSIS — R059 Cough, unspecified: Secondary | ICD-10-CM | POA: Diagnosis not present

## 2022-02-20 DIAGNOSIS — Z1152 Encounter for screening for COVID-19: Secondary | ICD-10-CM | POA: Diagnosis not present

## 2022-02-20 DIAGNOSIS — R0602 Shortness of breath: Secondary | ICD-10-CM | POA: Diagnosis not present

## 2022-02-20 DIAGNOSIS — R051 Acute cough: Secondary | ICD-10-CM

## 2022-02-20 LAB — RESP PANEL BY RT-PCR (FLU A&B, COVID) ARPGX2
Influenza A by PCR: NEGATIVE
Influenza B by PCR: NEGATIVE
SARS Coronavirus 2 by RT PCR: NEGATIVE

## 2022-02-20 LAB — GROUP A STREP BY PCR: Group A Strep by PCR: NOT DETECTED

## 2022-02-20 MED ORDER — BENZONATATE 100 MG PO CAPS: 100.0000 mg | ORAL_CAPSULE | Freq: Three times a day (TID) | ORAL | 0 refills | Status: DC

## 2022-02-20 MED ORDER — ALBUTEROL SULFATE HFA 108 (90 BASE) MCG/ACT IN AERS
2.0000 | INHALATION_SPRAY | Freq: Once | RESPIRATORY_TRACT | Status: AC
  Administered 2022-02-20: 2 via RESPIRATORY_TRACT
  Filled 2022-02-20: qty 6.7

## 2022-02-20 MED ORDER — AEROCHAMBER PLUS FLO-VU MEDIUM MISC
1.0000 | Freq: Once | Status: AC
  Administered 2022-02-20: 1
  Filled 2022-02-20: qty 1

## 2022-02-20 MED ORDER — PREDNISONE 20 MG PO TABS: 40.0000 mg | ORAL_TABLET | Freq: Every day | ORAL | 0 refills | Status: DC

## 2022-02-20 NOTE — ED Provider Notes (Signed)
[place] EMERGENCY DEPT Provider Note   CSN: [id number] Arrival date & time: 02/20/22  1534     History  Chief Complaint  Patient presents with   Cough   Generalized Body Aches    [name] is a 51 y.o. female.  Patient presents to the emergency department today for evaluation of URI symptoms that have been going on for greater than 7 days.  Initially she started with a headache and sinus pressure.  She was seen and prescribed a Z-Pak which helped improve her symptoms somewhat however over the past couple of days her cough has become worse again.  She denies fever.  She did vomit once today after having significant coughing.  No chest pain.  She has had some wheezing.  Denies history of asthma or COPD.  Denies tobacco use.       Home Medications Prior to Admission medications   Medication Sig Start Date End Date Taking? Authorizing Provider  benzonatate (TESSALON) 100 MG capsule Take 1 capsule (100 mg total) by mouth every 8 (eight) hours. 02/20/22  Yes Carlisle Cater, PA-C  predniSONE (DELTASONE) 20 MG tablet Take 2 tablets (40 mg total) by mouth daily. 02/20/22  Yes Carlisle Cater, PA-C  ALPRAZolam Duanne Moron) 0.5 MG tablet Take 1 tablet (0.5 mg total) by mouth daily as needed for anxiety. Once a day as needed for anxiety 12/16/21   Bayard Hugger, NP  cetirizine (ZYRTEC) 10 MG tablet Take 1 tablet (10 mg total) by mouth daily. 05/22/14   Dorena Dew, FNP  citalopram (CELEXA) 10 MG tablet Take 1 tablet (10 mg total) by mouth daily. 12/16/21   Bayard Hugger, NP  diclofenac (VOLTAREN) 75 MG EC tablet Take 1 tablet (75 mg total) by mouth 2 (two) times daily with a meal. 12/16/21   Bayard Hugger, NP  docusate sodium (COLACE) 100 MG capsule     [provider]  ergocalciferol (DRISDOL) 50000 UNITS capsule Take 1 capsule (50,000 Units total) by mouth once a week. Patient taking differently: Take 50,000 Units by mouth once a week. Mondays 05/30/14    Dorena Dew, FNP  gabapentin (NEURONTIN) 300 MG capsule TAKE 1 CAPSULE BY MOUTH IN THE MORNING AND AFTERNOON,DINNER AND 2 CAPSULES AT BEDTIME 12/16/21   Bayard Hugger, NP  LINZESS 145 MCG CAPS capsule Take 145 mcg by mouth daily. 09/16/20   [provider]  ondansetron (ZOFRAN-ODT) 8 MG disintegrating tablet ondansetron 8 mg disintegrating tablet  LET 1 TABLET DISSOLVE ON TOP OF THE TONGUE TWICE A DAY AS NEEDED    [provider]  oxyCODONE-acetaminophen (PERCOCET) 10-325 MG tablet Take 1 tablet (10/325 mg total) by mouth 5 (five) times daily as needed. 12/16/21   Bayard Hugger, NP  phentermine (ADIPEX-P) 37.5 MG tablet Take 37.5 mg by mouth every morning. 12/11/14   [provider]  promethazine (PHENERGAN) 12.5 MG tablet Take 1 tablet (12.5 mg total) by mouth every 6 (six) hours as needed for nausea or vomiting. 12/16/21   Bayard Hugger, NP  scopolamine (TRANSDERM-SCOP) 1 MG/3DAYS  03/12/20   [provider]  tiZANidine (ZANAFLEX) 4 MG tablet May take 1- 2 tablets every 8 hours as needed for muscle spasms 12/16/21   Bayard Hugger, NP  oxyCODONE-acetaminophen (PERCOCET) 10-325 MG tablet Take 1 tablet (10/325 mg total) by mouth 5 (five) times daily as needed. 12/30/20   Bayard Hugger, NP      Allergies    Codeine, Iodine, Penicillins,  Shrimp [shellfish allergy], Contrast media [iodinated contrast media], and Meperidine and related    Review of Systems   Review of Systems  Physical Exam Updated Vital Signs BP 115/82 (BP Location: Right Arm)   Pulse 75   Temp (!) 97.4 F (36.3 C) (Oral)   Resp 18   Ht '5\' 5"'$  (1.651 m)   Wt 111.1 kg   LMP 02/27/2011   SpO2 100%   BMI 40.77 kg/m  Physical Exam Vitals and nursing note reviewed.  Constitutional:      Appearance: She is well-developed.  HENT:     Head: Normocephalic and atraumatic.     Jaw: No trismus.     Right Ear: Tympanic membrane, ear canal and external ear normal.     Left Ear:  Tympanic membrane, ear canal and external ear normal.     Nose: Congestion and rhinorrhea present. No mucosal edema.     Mouth/Throat:     Mouth: Mucous membranes are moist. Mucous membranes are not dry. No oral lesions.     Pharynx: Uvula midline. No oropharyngeal exudate, posterior oropharyngeal erythema or uvula swelling.     Tonsils: No tonsillar abscesses.  Eyes:     General:        Right eye: No discharge.        Left eye: No discharge.     Conjunctiva/sclera: Conjunctivae normal.  Cardiovascular:     Rate and Rhythm: Normal rate and regular rhythm.     Heart sounds: Normal heart sounds.  Pulmonary:     Effort: Pulmonary effort is normal. No respiratory distress.     Breath sounds: Wheezing (Scattered mild expiratory wheeze) and rhonchi (Significant clears with coughing) present. No rales.  Abdominal:     Palpations: Abdomen is soft.     Tenderness: There is no abdominal tenderness.  Musculoskeletal:     Cervical back: Normal range of motion and neck supple.  Lymphadenopathy:     Cervical: No cervical adenopathy.  Skin:    General: Skin is warm and dry.  Neurological:     Mental Status: She is alert.  Psychiatric:        Mood and Affect: Mood normal.     ED Results / Procedures / Treatments   Labs (all labs ordered are listed, but only abnormal results are displayed) Labs Reviewed  GROUP A STREP BY PCR  RESP PANEL BY RT-PCR (FLU A&B, COVID) [id number]    EKG None  Radiology DG Chest 2 View  Result Date: 02/20/2022 CLINICAL DATA:  Cough and shortness of breath EXAM: CHEST - 2 VIEW COMPARISON:  April 30, 2019 FINDINGS: The heart size and mediastinal contours are within normal limits. Both lungs are clear. The visualized skeletal structures are unremarkable. IMPRESSION: No active cardiopulmonary disease. Electronically Signed   By: Dorise Bullion III M.D.   On: 02/20/2022 19:14    Procedures Procedures    Medications Ordered in ED Medications  albuterol  (VENTOLIN HFA) 108 (90 Base) MCG/ACT inhaler 2 puff (2 puffs Inhalation Given 02/20/22 1931)  AeroChamber Plus Flo-Vu Medium MISC 1 each (1 each Other Given 02/20/22 1932)    ED Course/ Medical Decision Making/ A&P    Patient seen and examined. History obtained directly from patient. Work-up including labs, imaging, EKG ordered in triage, if performed, were reviewed.    Labs/EKG: Independently reviewed and interpreted.  This included: Flu, COVID, strep negative.  Imaging: Ordered chest x-ray.  Medications/Fluids: Ordered: Albuterol HFA  Most recent vital signs reviewed and are  as follows: BP 115/82 (BP Location: Right Arm)   Pulse 75   Temp (!) 97.4 F (36.3 C) (Oral)   Resp 18   Ht '5\' 5"'$  (1.651 m)   Wt 111.1 kg   LMP 02/27/2011   SpO2 100%   BMI 40.77 kg/m   Initial impression: Viral infection, cough, possible bronchitis.  8:01 PM Reassessment performed. Patient appears stable.  Exam unchanged, no longer coughing however.  Imaging personally visualized and interpreted including: Chest x-ray, agree negative.  Reviewed pertinent lab work and imaging with patient at bedside. Questions answered.   Most current vital signs reviewed and are as follows: BP 115/82 (BP Location: Right Arm)   Pulse 75   Temp (!) 97.4 F (36.3 C) (Oral)   Resp 18   Ht '5\' 5"'$  (1.651 m)   Wt 111.1 kg   LMP 02/27/2011   SpO2 100%   BMI 40.77 kg/m   Plan: Discharge to home.    Prescriptions written for: Will give 5-day burst of prednisone to see if this helps with her symptoms as well as Tessalon for cough.  Other home care instructions discussed: Use of albuterol inhaler, rest, hydration.  ED return instructions discussed: Worsening shortness of breath, trouble breathing, chest pain, persistent vomiting or other concerns.  Follow-up instructions discussed: Patient encouraged to follow-up with their PCP in 3-5 days if not improving.                           Medical Decision Making Amount  and/or Complexity of Data Reviewed Radiology: ordered.  Risk Prescription drug management.   Patient presents with 7 to 10-day history of respiratory symptoms.  Initially improved with Z-Pak, but now worsening again.  No fevers.  No hypoxia.  Chest x-ray is clear and I have low concern for pneumonia.  This does not appear to be ACS or PE.  She does have elements of bronchitis and will treat as above.  She looks comfortable.  She did have posttussive emesis today but is not currently nauseous or vomiting.  The patient's vital signs, pertinent lab work and imaging were reviewed and interpreted as discussed in the ED course. Hospitalization was considered for further testing, treatments, or serial exams/observation. However as patient is well-appearing, has a stable exam, and reassuring studies today, I do not feel that they warrant admission at this time. This plan was discussed with the patient who verbalizes agreement and comfort with this plan and seems reliable and able to return to the Emergency Department with worsening or changing symptoms.           Final Clinical Impression(s) / ED Diagnoses Final diagnoses:  Acute bronchitis, unspecified organism  Acute cough    Rx / DC Orders ED Discharge Orders          Ordered    predniSONE (DELTASONE) 20 MG tablet  Daily        02/20/22 1957    benzonatate (TESSALON) 100 MG capsule  Every 8 hours        02/20/22 1957              Carlisle Cater, Hershal Coria 02/20/22 2004    Wyvonnia Dusky, MD 02/20/22 2259

## 2022-02-20 NOTE — Discharge Instructions (Addendum)
Please read and follow all provided instructions.  Your diagnoses today include:  1. Acute bronchitis, unspecified organism   2. Acute cough     Tests performed today include: Chest x-ray - does not show any pneumonia Flu, COVID, strep: Negative Vital signs. See below for your results today.   Medications prescribed:  Albuterol inhaler - medication that opens up your airway  Use inhaler as follows: 1-2 puffs with spacer every 4 hours as needed for wheezing, cough, or shortness of breath.   Prednisone - steroid medicine   It is best to take this medication in the morning to prevent sleeping problems. If you are diabetic, monitor your blood sugar closely and stop taking Prednisone if blood sugar is over 300. Take with food to prevent stomach upset.   Tessalon Perles - cough suppressant medication  Take any prescribed medications only as directed.  Home care instructions:  Follow any educational materials contained in this packet.  Follow-up instructions: Please follow-up with your primary care provider in the next 3 days for further evaluation of your symptoms and a recheck if you are not feeling better.   Return instructions:  Please return to the Emergency Department if you experience worsening symptoms. Please return with worsening wheezing, shortness of breath, or difficulty breathing. Return with persistent fever above 101F.  Please return if you have any other emergent concerns.  Additional Information:  Your vital signs today were: BP 115/82 (BP Location: Right Arm)   Pulse 75   Temp (!) 97.4 F (36.3 C) (Oral)   Resp 18   Ht '5\' 5"'$  (1.651 m)   Wt 111.1 kg   LMP 02/27/2011   SpO2 100%   BMI 40.77 kg/m  If your blood pressure (BP) was elevated above 135/85 this visit, please have this repeated by your doctor within one month. --------------

## 2022-02-20 NOTE — ED Triage Notes (Signed)
Cough, sore throat, headache, right ear pain and body aches x 7 days. Threw up 2 times today. Denies any fever or diarrhea.

## 2022-03-05 ENCOUNTER — Telehealth: Payer: Self-pay | Admitting: Registered Nurse

## 2022-03-05 DIAGNOSIS — M7061 Trochanteric bursitis, right hip: Secondary | ICD-10-CM

## 2022-03-05 MED ORDER — OXYCODONE-ACETAMINOPHEN 10-325 MG PO TABS: ORAL_TABLET | ORAL | 0 refills | Status: DC

## 2022-03-05 NOTE — Telephone Encounter (Signed)
PMP was Reviewed.  Oxycodone e-scribed today.  [name] is aware of the above via My-Chart message.

## 2022-03-11 ENCOUNTER — Encounter: Payer: Self-pay | Admitting: Registered Nurse

## 2022-03-11 ENCOUNTER — Encounter: Payer: 59 | Attending: Physical Medicine and Rehabilitation | Admitting: Registered Nurse

## 2022-03-11 VITALS — BP 135/76 | HR 85 | Ht 65.0 in | Wt 262.0 lb

## 2022-03-11 DIAGNOSIS — M62830 Muscle spasm of back: Secondary | ICD-10-CM

## 2022-03-11 DIAGNOSIS — Z5181 Encounter for therapeutic drug level monitoring: Secondary | ICD-10-CM | POA: Diagnosis not present

## 2022-03-11 DIAGNOSIS — M5416 Radiculopathy, lumbar region: Secondary | ICD-10-CM | POA: Diagnosis not present

## 2022-03-11 DIAGNOSIS — M7061 Trochanteric bursitis, right hip: Secondary | ICD-10-CM

## 2022-03-11 DIAGNOSIS — M7062 Trochanteric bursitis, left hip: Secondary | ICD-10-CM

## 2022-03-11 DIAGNOSIS — F411 Generalized anxiety disorder: Secondary | ICD-10-CM

## 2022-03-11 DIAGNOSIS — Z79891 Long term (current) use of opiate analgesic: Secondary | ICD-10-CM | POA: Diagnosis not present

## 2022-03-11 DIAGNOSIS — G894 Chronic pain syndrome: Secondary | ICD-10-CM

## 2022-03-11 DIAGNOSIS — G834 Cauda equina syndrome: Secondary | ICD-10-CM | POA: Diagnosis not present

## 2022-03-11 MED ORDER — OXYCODONE-ACETAMINOPHEN 10-325 MG PO TABS: ORAL_TABLET | ORAL | 0 refills | Status: DC

## 2022-03-11 NOTE — Progress Notes (Signed)
Subjective:    Patient ID: [name], female    DOB: 09-30-1970, 51 y.o.   MRN: [id number]  HPI: [name] is a 51 y.o. female who returns for follow up appointment for chronic pain and medication refill. She states her pain is located in her lower back mainly left side radiating into her left lower extremity. She also reports left hip pain. She rates her pain 9. Her current exercise regime is walking and performing stretching exercises.  [name] equivalent is 75.00 MME. She  is also prescribed Alprazolam .We have discussed the black box warning of using opioids and benzodiazepines. I highlighted the dangers of using these drugs together and discussed the adverse events including respiratory suppression, overdose, cognitive impairment and importance of compliance with current regimen. We will continue to monitor and adjust as indicated.   Last UDS was Performed on 10/15/2021, it was consistent.      Pain Inventory Average Pain 9 Pain Right Now 9 My pain is constant, sharp, burning, dull, tingling, and aching  In the last 24 hours, has pain interfered with the following? General activity 9 Relation with others 9 Enjoyment of life 9 What TIME of day is your pain at its worst? morning , daytime, evening, and night Sleep (in general) Poor  Pain is worse with: walking, bending, sitting, and standing Pain improves with: rest, heat/ice, therapy/exercise, and medication Relief from Meds: 2  Family History  Problem Relation Age of Onset   Hypertension Maternal Grandmother    Anesthesia problems Neg Hx    Social History   Socioeconomic History   Marital status: Divorced    Spouse name: Not on file   Number of children: Not on file   Years of education: Not on file   Highest education level: Not on file  Occupational History   Not on file  Tobacco Use   Smoking status: Never   Smokeless tobacco: Never  Vaping Use   Vaping Use: Never used  Substance and  Sexual Activity   Alcohol use: No   Drug use: No   Sexual activity: Not Currently    Birth control/protection: None    Comment: IUD removed 2 wks ago.  Other Topics Concern   Not on file  Social History Narrative   Not on file   Social Determinants of Health   Financial Resource Strain: Not on file  Food Insecurity: Not on file  Transportation Needs: Not on file  Physical Activity: Not on file  Stress: Not on file  Social Connections: Not on file   Past Surgical History:  Procedure Laterality Date   ABDOMINAL HYSTERECTOMY  06/29/11   Robotic Assisted Hysterectomy   CERVICAL SPINE SURGERY  [place]  08/31/2011   Procedure: MICRODISCECTOMY LUMBAR LAMINECTOMY;  Surgeon: Tobi Bastos, MD;  [facility];  Service: Orthopedics;  Laterality: N/A;  lumbar five sacral one central microdisectomy   MYOMECTOMY  13   NECK SURGERY     POSTERIOR CERVICAL FUSION/FORAMINOTOMY N/A 02/02/2013   Procedure: POSTERIOR C5-6 SPINAL FUSION;  Surgeon: Melina Schools, MD;  Location: [place];  Service: Orthopedics;  Laterality: N/A;   POSTERIOR FUSION CERVICAL SPINE  02/02/2013   C 5 C6     [name]   WISDOM TOOTH EXTRACTION     Past Surgical History:  Procedure Laterality Date   ABDOMINAL HYSTERECTOMY  06/29/11   Robotic Assisted Hysterectomy   CERVICAL SPINE SURGERY  10  [place]   LUMBAR LAMINECTOMY  08/31/2011   Procedure: MICRODISCECTOMY LUMBAR LAMINECTOMY;  Surgeon: Tobi Bastos, MD;  [facility];  Service: Orthopedics;  Laterality: N/A;  lumbar five sacral one central microdisectomy   MYOMECTOMY  13   NECK SURGERY     POSTERIOR CERVICAL FUSION/FORAMINOTOMY N/A 02/02/2013   Procedure: POSTERIOR C5-6 SPINAL FUSION;  Surgeon: Melina Schools, MD;  Location: [place];  Service: Orthopedics;  Laterality: N/A;   POSTERIOR FUSION CERVICAL SPINE  02/02/2013   C 5 C6     [name]   WISDOM TOOTH EXTRACTION     Past Medical History:   Diagnosis Date   Anemia    hx   Arthritis    Complication of anesthesia    slow to wake up,   very difficult to get iv had picc line last time   Difficult intravenous access    hands are usually best for blood draws and IV starts   DVT (deep venous thrombosis) ([place])    ? vs lupus   Fibroids    Headache(784.0)    Neck pain    Scoliosis    LMP 02/27/2011   Opioid Risk Score:   Fall Risk Score:  `1  Depression screen [facility] 2/9     10/15/2021   11:27 AM 07/08/2021   11:27 AM 03/28/2021   11:26 AM 02/28/2021   11:32 AM 11/22/2020   11:53 AM 10/22/2020   11:41 AM 09/20/2020   11:17 AM  Depression screen PHQ 2/9  Decreased Interest 0 0 0 0 0 0 0  Down, Depressed, Hopeless 0 0 0 0 0 0   PHQ - 2 Score 0 0 0 0 0 0 0  Altered sleeping    0   0  PHQ-9 Score    0   0    Review of Systems  Musculoskeletal:  Positive for gait problem.       Left hip pain & left upper leg pain  All other systems reviewed and are negative.      Objective:   Physical Exam Vitals and nursing note reviewed.  Constitutional:      Appearance: Normal appearance.  Cardiovascular:     Rate and Rhythm: Normal rate and regular rhythm.     Pulses: Normal pulses.     Heart sounds: Normal heart sounds.  Pulmonary:     Effort: Pulmonary effort is normal.     Breath sounds: Normal breath sounds.  Musculoskeletal:     Cervical back: Normal range of motion and neck supple.     Comments: Normal Muscle Bulk and Muscle Testing Reveals:  Upper Extremities: Full ROM and Muscle Strength 5/5  Lumbar Hypersensitivity Left Greater Trochanter Tenderness Lower Extremities: Right: Full ROM and Muscle Strength 5/5 Left Lower Extremity Flexion Produces Pain into her Lower back  Arises from Table Slowly using cane for support Antalgic  Gait     Skin:    General: Skin is warm and dry.  Neurological:     Mental Status: She is alert and oriented to person, place, and time.  Psychiatric:        Mood and Affect: Mood  normal.        Behavior: Behavior normal.         Assessment & Plan:  1. 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 Status post decompressive laminectomy:Chronic Low Back Pain without Sciatica:  Continue to Monitor  Refilled: Oxycodone '10mg'$ /325 mg one tablet five times  a day as needed #135. Second script sent for the following month. 03/11/2022 We will continue the opioid monitoring program, this consists of regular clinic visits, examinations, urine drug screen, pill counts as well as use of [place] Controlled Substance Reporting system. A 12 month History has been reviewed on the [place] on 03/11/2022. 2. Insomnia/ Anxiety: Continue  Xanax to 0.5 mg at HS, educated on the [employer], she verbalizes understanding. 03/11/2022 3. Lumbar Radiculopathy/Neuropathic pain: Continue current medication regimen with Gabapentin. 03/11/2022 4. Muscle Spasms. Continue current medication regimen with: Tizanidine. 03/11/2022 5. Neurogenic bowel and bladder. Continue Bowel Regimen. Continue to monitor. 03/11/2022 6. Constipation/Opioid Therapy: Continue current medication regimen with  Linzess. 03/11/2022 7. Depression: Continue current medication regimen with Celexa. Continue to monitor. 03/11/2022 8. Left  Greater Trochanteric Bursitis:  Continue  Heat/Ice Therapy. 03/11/2022 9. Chronic Pain Syndrome: Continue current medication regimen with Voltaren. 03/11/2022 10. Chronic Right Knee Pain: No complaints today. Continue HEP as Tolerated. Continue current medication regimen and continue to monitor. 03/11/2022 11. Cervicalgia/ Cervical Radiculitis: No complaints today. Continue current medication regimen and HEP as tolerated.. Continue to Monitor. 03/11/2022 .    F/U in 2 months

## 2022-05-04 ENCOUNTER — Telehealth: Payer: Self-pay | Admitting: Registered Nurse

## 2022-05-04 ENCOUNTER — Encounter: Payer: Medicaid Other | Attending: Physical Medicine and Rehabilitation | Admitting: Registered Nurse

## 2022-05-04 ENCOUNTER — Encounter: Payer: Self-pay | Admitting: Registered Nurse

## 2022-05-04 VITALS — BP 123/87 | HR 90 | Ht 65.0 in | Wt 265.0 lb

## 2022-05-04 DIAGNOSIS — M7061 Trochanteric bursitis, right hip: Secondary | ICD-10-CM | POA: Insufficient documentation

## 2022-05-04 DIAGNOSIS — M961 Postlaminectomy syndrome, not elsewhere classified: Secondary | ICD-10-CM | POA: Diagnosis present

## 2022-05-04 DIAGNOSIS — G894 Chronic pain syndrome: Secondary | ICD-10-CM | POA: Insufficient documentation

## 2022-05-04 DIAGNOSIS — Z5181 Encounter for therapeutic drug level monitoring: Secondary | ICD-10-CM | POA: Diagnosis not present

## 2022-05-04 DIAGNOSIS — M62830 Muscle spasm of back: Secondary | ICD-10-CM | POA: Insufficient documentation

## 2022-05-04 DIAGNOSIS — G834 Cauda equina syndrome: Secondary | ICD-10-CM | POA: Insufficient documentation

## 2022-05-04 DIAGNOSIS — Z79891 Long term (current) use of opiate analgesic: Secondary | ICD-10-CM | POA: Insufficient documentation

## 2022-05-04 DIAGNOSIS — F411 Generalized anxiety disorder: Secondary | ICD-10-CM | POA: Insufficient documentation

## 2022-05-04 DIAGNOSIS — M5416 Radiculopathy, lumbar region: Secondary | ICD-10-CM | POA: Insufficient documentation

## 2022-05-04 DIAGNOSIS — M7062 Trochanteric bursitis, left hip: Secondary | ICD-10-CM | POA: Insufficient documentation

## 2022-05-04 MED ORDER — OXYCODONE-ACETAMINOPHEN 10-325 MG PO TABS: ORAL_TABLET | ORAL | 0 refills | Status: DC

## 2022-05-04 NOTE — Progress Notes (Signed)
Subjective:    Patient ID: [name], female    DOB: [birth year], 52 y.o.   MRN: [id number]  HPI: [name] is a 52 y.o. female who returns for follow up appointment for chronic pain and medication refill. She states her pain is located in her lower back pain radiating into her left hip and bilateral lower extremities L>R. She rates her pain 9. Her current exercise regime is walking and performing stretching exercises.  [name] Morphine equivalent is 67.50  MME. She  is also prescribed Alprazolam .We have discussed the black box warning of using opioids and benzodiazepines. I highlighted the dangers of using these drugs together and discussed the adverse events including respiratory suppression, overdose, cognitive impairment and importance of compliance with current regimen. We will continue to monitor and adjust as indicated.    UDS ordered      Pain Inventory Average Pain 9 Pain Right Now 9 My pain is sharp, dull, stabbing, tingling and aching  In the last 24 hours, has pain interfered with the following? General activity 9 Relation with others 9 Enjoyment of life 9 What TIME of day is your pain at its worst? morning , daytime, evening, and night Sleep (in general) Poor  Pain is worse with: walking, bending, sitting, standing, and some activites Pain improves with: rest, heat/ice, therapy/exercise, and medication Relief from Meds: 4  Family History  Problem Relation Age of Onset   Hypertension Maternal Grandmother    Anesthesia problems Neg Hx    Social History   Socioeconomic History   Marital status: Divorced    Spouse name: Not on file   Number of children: Not on file   Years of education: Not on file   Highest education level: Not on file  Occupational History   Not on file  Tobacco Use   Smoking status: Never   Smokeless tobacco: Never  Vaping Use   Vaping Use: Never used  Substance and Sexual Activity   Alcohol use: No   Drug use: No   Sexual  activity: Not Currently    Birth control/protection: None    Comment: IUD removed 2 wks ago.  Other Topics Concern   Not on file  Social History Narrative   Not on file   Social Determinants of Health   Financial Resource Strain: Not on file  Food Insecurity: Not on file  Transportation Needs: Not on file  Physical Activity: Not on file  Stress: Not on file  Social Connections: Not on file   Past Surgical History:  Procedure Laterality Date   ABDOMINAL HYSTERECTOMY  06/29/11   Robotic Assisted Hysterectomy   CERVICAL SPINE SURGERY  [place]  08/31/2011   Procedure: MICRODISCECTOMY LUMBAR LAMINECTOMY;  Surgeon: Tobi Bastos, MD;  [facility];  Service: Orthopedics;  Laterality: N/A;  lumbar five sacral one central microdisectomy   MYOMECTOMY  13   NECK SURGERY     POSTERIOR CERVICAL FUSION/FORAMINOTOMY N/A 02/02/2013   Procedure: POSTERIOR C5-6 SPINAL FUSION;  Surgeon: Melina Schools, MD;  Location: [place];  Service: Orthopedics;  Laterality: N/A;   POSTERIOR FUSION CERVICAL SPINE  02/02/2013   C 5 C6     [name]   WISDOM TOOTH EXTRACTION     Past Surgical History:  Procedure Laterality Date   ABDOMINAL HYSTERECTOMY  06/29/11   Robotic Assisted Hysterectomy   CERVICAL SPINE SURGERY  [place]  LUMBAR LAMINECTOMY  08/31/2011   Procedure: MICRODISCECTOMY LUMBAR LAMINECTOMY;  Surgeon: Tobi Bastos, MD;  [facility];  Service: Orthopedics;  Laterality: N/A;  lumbar five sacral one central microdisectomy   MYOMECTOMY  13   NECK SURGERY     POSTERIOR CERVICAL FUSION/FORAMINOTOMY N/A 02/02/2013   Procedure: POSTERIOR C5-6 SPINAL FUSION;  Surgeon: Melina Schools, MD;  Location: [place];  Service: Orthopedics;  Laterality: N/A;   POSTERIOR FUSION CERVICAL SPINE  02/02/2013   C 5 C6     [name]   WISDOM TOOTH EXTRACTION     Past Medical History:  Diagnosis Date   Anemia    hx   Arthritis    Complication  of anesthesia    slow to wake up,   very difficult to get iv had picc line last time   Difficult intravenous access    hands are usually best for blood draws and IV starts   DVT (deep venous thrombosis) ([place])    ? vs lupus   Fibroids    Headache(784.0)    Neck pain    Scoliosis    LMP 02/27/2011   Opioid Risk Score:   Fall Risk Score:  `1  Depression screen [facility] 2/9     03/11/2022   11:19 AM 10/15/2021   11:27 AM 07/08/2021   11:27 AM 03/28/2021   11:26 AM 02/28/2021   11:32 AM 11/22/2020   11:53 AM 10/22/2020   11:41 AM  Depression screen PHQ 2/9  Decreased Interest 0 0 0 0 0 0 0  Down, Depressed, Hopeless 0 0 0 0 0 0 0  PHQ - 2 Score 0 0 0 0 0 0 0  Altered sleeping     0    PHQ-9 Score     0      Review of Systems  Constitutional: Negative.   HENT: Negative.    Eyes: Negative.   Respiratory: Negative.    Endocrine: Negative.   Genitourinary: Negative.   Musculoskeletal:  Positive for back pain and myalgias.  Skin: Negative.   Allergic/Immunologic: Negative.   Neurological: Negative.   Hematological: Negative.   Psychiatric/Behavioral: Negative.        Objective:   Physical Exam Vitals and nursing note reviewed.  Constitutional:      Appearance: Normal appearance.  Cardiovascular:     Rate and Rhythm: Normal rate and regular rhythm.     Pulses: Normal pulses.     Heart sounds: Normal heart sounds.  Pulmonary:     Effort: Pulmonary effort is normal.     Breath sounds: Normal breath sounds.  Musculoskeletal:     Cervical back: Normal range of motion and neck supple.     Comments: Normal Muscle Bulk and Muscle Testing Reveals: Upper Extremities: Full ROM and Muscle Strength 5/5  Lumbar Paraspinal Tenderness: L-3-L-5 Left Greater Trochanter Tenderness Lower Extremities: Right: Full ROM and Muscle Strength 5/5 Left Lower Extremity Decreased ROM and Muscle Strength 5/5 Left Lower Extremity Flexion Produces Pain into her Left Lower Extremity Arises from Table  slowly using cane for support Antalgic  Gait     Skin:    General: Skin is warm and dry.  Neurological:     Mental Status: She is alert and oriented to person, place, and time.  Psychiatric:        Mood and Affect: Mood normal.        Behavior: Behavior normal.         Assessment & Plan:  1.  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 Status post decompressive laminectomy:Continue to Monitor  Refilled: Oxycodone 37m/325 mg one tablet five times a day as needed #135.  05/04/2022 We will continue the opioid monitoring program, this consists of regular clinic visits, examinations, urine drug screen, pill [place]. 2. Insomnia/ Anxiety: Continue  Xanax to 0.5 mg at HS, educated on the [employer] she verbalizes understanding. 05/04/2022 3. Lumbar Radiculopathy/Neuropathic pain: Continue current medication regimen with Gabapentin. 05/04/2022 4. Muscle Spasms. Continue current medication regimen with: Tizanidine. 05/04/2022 5. Neurogenic bowel and bladder. Continue Bowel Regimen. Continue to monitor. 05/04/2022 6. Constipation/Opioid Therapy: Continue current medication regimen with  Linzess. 05/04/2022 7. Depression: Continue current medication regimen with Celexa. Continue to monitor. 05/04/2022 8. Left  Greater Trochanteric Bursitis:  Continue  Heat/Ice Therapy. 05/04/2022 9. Chronic Pain Syndrome: Continue current medication regimen with Voltaren. 05/04/2022 10. Chronic Right Knee Pain: No complaints today. Continue HEP as Tolerated. Continue current medication regimen and continue to monitor. 05/04/2022 11. Cervicalgia/ Cervical Radiculitis: No complaints today. Continue current medication regimen and HEP as tolerated.. Continue to Monitor. 05/04/2022 .    F/U in 2  months

## 2022-05-04 NOTE — Telephone Encounter (Signed)
Received prescription for percocet. Needing day supply to confirm when it is due for filing. Please call pharmacy to clarify.  Call# [phone]

## 2022-05-07 LAB — TOXASSURE SELECT,+ANTIDEPR,UR

## 2022-05-08 ENCOUNTER — Telehealth: Payer: Self-pay | Admitting: *Deleted

## 2022-05-08 NOTE — Telephone Encounter (Signed)
PA request submitted via covermymeds. [id number] pending approval

## 2022-05-11 NOTE — Telephone Encounter (Signed)
Outcome Denied on February 16 Request Reference Number: [id number]. OXYCOD/APAP TAB 10-325MG is denied for not meeting the prior authorization requirement(s). For further questions, call [place] at [phone] for more information.

## 2022-05-11 NOTE — Telephone Encounter (Signed)
Called [employer] and spoke to [place]. Issue with the way rx was written #135 question is how many day supply is this 27 or 30 day? This was bill through [facility].

## 2022-05-18 DIAGNOSIS — M329 Systemic lupus erythematosus, unspecified: Secondary | ICD-10-CM | POA: Diagnosis not present

## 2022-05-18 DIAGNOSIS — R7309 Other abnormal glucose: Secondary | ICD-10-CM | POA: Diagnosis not present

## 2022-05-18 DIAGNOSIS — E785 Hyperlipidemia, unspecified: Secondary | ICD-10-CM | POA: Diagnosis not present

## 2022-05-18 DIAGNOSIS — E559 Vitamin D deficiency, unspecified: Secondary | ICD-10-CM | POA: Diagnosis not present

## 2022-05-19 ENCOUNTER — Encounter (HOSPITAL_BASED_OUTPATIENT_CLINIC_OR_DEPARTMENT_OTHER): Payer: Medicaid Other | Admitting: Physical Medicine & Rehabilitation

## 2022-05-19 ENCOUNTER — Encounter: Payer: Self-pay | Admitting: Physical Medicine & Rehabilitation

## 2022-05-19 VITALS — BP 143/88 | HR 96 | Ht 65.0 in | Wt 265.0 lb

## 2022-05-19 DIAGNOSIS — M5416 Radiculopathy, lumbar region: Secondary | ICD-10-CM

## 2022-05-19 DIAGNOSIS — M961 Postlaminectomy syndrome, not elsewhere classified: Secondary | ICD-10-CM | POA: Diagnosis not present

## 2022-05-19 NOTE — Progress Notes (Addendum)
Subjective:    Patient ID: [name], female    DOB: [birth year], 52 y.o.   MRN: [id number]  HPI CC:  Low back wit Left hip pain  Chronic low back pain with hx of lumbar laminectomy 2013 with Dr Rushie Nyhan.  The patient did not have any instrumentation.  Limited electronic medical records from 08/31/2011 indicated lumbar decompression microdiscectomy and laminectomy L5-S1.  Reviewed lumbar MRI 08/29/2011 demonstrating a large central disc at 5 S1 tensely impinging upon both S1 nerve roots  Has had 12mohx of Left hip and left foot pain  No PT for this back issue. No recent falls or trauma, no fevers.  No bowel or bladder dysfunction or progressive lower extremity weakness. Pain Inventory Average Pain 9 Pain Right Now 9 My pain is sharp, dull, stabbing, tingling, and aching  In the last 24 hours, has pain interfered with the following? General activity 9 Relation with others 9 Enjoyment of life 9 What TIME of day is your pain at its worst? morning , daytime, evening, and night Sleep (in general) Poor  Pain is worse with: walking, bending, sitting, and standing Pain improves with: heat/ice, therapy/exercise, and medication Relief from Meds: 2  Family History  Problem Relation Age of Onset   Hypertension Maternal Grandmother    Anesthesia problems Neg Hx    Social History   Socioeconomic History   Marital status: Divorced    Spouse name: Not on file   Number of children: Not on file   Years of education: Not on file   Highest education level: Not on file  Occupational History   Not on file  Tobacco Use   Smoking status: Never   Smokeless tobacco: Never  Vaping Use   Vaping Use: Never used  Substance and Sexual Activity   Alcohol use: No   Drug use: No   Sexual activity: Not Currently    Birth control/protection: None    Comment: IUD removed 2 wks ago.  Other Topics Concern   Not on file  Social History Narrative   Not on file   Social Determinants of Health    Financial Resource Strain: Not on file  Food Insecurity: Not on file  Transportation Needs: Not on file  Physical Activity: Not on file  Stress: Not on file  Social Connections: Not on file   Past Surgical History:  Procedure Laterality Date   ABDOMINAL HYSTERECTOMY  06/29/11   Robotic Assisted Hysterectomy   CERVICAL SPINE SURGERY  [place] 08/31/2011   Procedure: MICRODISCECTOMY LUMBAR LAMINECTOMY;  Surgeon: RTobi Bastos MD;  [facility];  Service: Orthopedics;  Laterality: N/A;  lumbar five sacral one central microdisectomy   MYOMECTOMY  13   NECK SURGERY     POSTERIOR CERVICAL FUSION/FORAMINOTOMY N/A 02/02/2013   Procedure: POSTERIOR C5-6 SPINAL FUSION;  Surgeon: DMelina Schools MD;  [place]  Service: Orthopedics;  Laterality: N/A;   POSTERIOR FUSION CERVICAL SPINE  02/02/2013   C 5 C6     Dr BRolena Infante  WISDOM TOOTH EXTRACTION     Past Surgical History:  Procedure Laterality Date   ABDOMINAL HYSTERECTOMY  06/29/11   Robotic Assisted Hysterectomy   CERVICAL SPINE SURGERY  10   CESAREAN SECTION  1996   LUMBAR LAMINECTOMY  08/31/2011   Procedure: MICRODISCECTOMY LUMBAR LAMINECTOMY;  Surgeon: RTobi Bastos MD;  [facility];  Service: Orthopedics;  Laterality: N/A;  lumbar five sacral  one central microdisectomy   MYOMECTOMY  13   NECK SURGERY     POSTERIOR CERVICAL FUSION/FORAMINOTOMY N/A 02/02/2013   Procedure: POSTERIOR C5-6 SPINAL FUSION;  Surgeon: Melina Schools, MD;  Location: [place];  Service: Orthopedics;  Laterality: N/A;   POSTERIOR FUSION CERVICAL SPINE  02/02/2013   C 5 C6     [name]   WISDOM TOOTH EXTRACTION     Past Medical History:  Diagnosis Date   Anemia    hx   Arthritis    Complication of anesthesia    slow to wake up,   very difficult to get iv had picc line last time   Difficult intravenous access    hands are usually best for blood draws and IV starts   DVT (deep venous thrombosis)  ([place])    ? vs lupus   Fibroids    Headache(784.0)    Neck pain    Scoliosis    Ht '5\' 5"'$  (1.651 m)   LMP 02/27/2011   BMI 44.10 kg/m   Opioid Risk Score:   Fall Risk Score:  `1  Depression screen [facility] 2/9     03/11/2022   11:19 AM 10/15/2021   11:27 AM 07/08/2021   11:27 AM 03/28/2021   11:26 AM 02/28/2021   11:32 AM 11/22/2020   11:53 AM 10/22/2020   11:41 AM  Depression screen PHQ 2/9  Decreased Interest 0 0 0 0 0 0 0  Down, Depressed, Hopeless 0 0 0 0 0 0 0  PHQ - 2 Score 0 0 0 0 0 0 0  Altered sleeping     0    PHQ-9 Score     0       Review of Systems  Musculoskeletal:  Positive for back pain and gait problem.  All other systems reviewed and are negative.     Objective:   Physical Exam  General no acute distress Mood and affect friendly and cooperative Lumbar spine has mild tenderness palpation lumbar paraspinal area left greater than right also has tenderness over the gluteal region on the left side as well as trochanteric bursa area. Negative straight leg raising bilaterally Motor strength is 5/5 hip flexor knee extensor ankle dorsiflexor Sensation is normal to pinprick and light touch as well as proprioception in both feet.      Assessment & Plan:   #1.  Chronic low back pain with lumbar postlaminectomy syndrome.  She has pain that radiates into her left hip and buttock area.  This may be myofascial as well as some element of trochanteric bursitis.  She does not have any clear-cut radicular signs. I do think she would benefit from outpatient physical therapy and return to clinic in 1 month for reevaluation.  If no better we may consider repeating imaging studies given that her MRI from 2013 did not demonstrate findings that would correspond to her current symptoms

## 2022-05-29 ENCOUNTER — Telehealth: Payer: Self-pay | Admitting: *Deleted

## 2022-05-29 NOTE — Telephone Encounter (Signed)
Urine drug screen for this encounter is consistent for prescribed medication 

## 2022-06-01 ENCOUNTER — Encounter: Payer: Self-pay | Admitting: Registered Nurse

## 2022-06-01 ENCOUNTER — Encounter: Payer: Medicaid Other | Attending: Physical Medicine and Rehabilitation | Admitting: Registered Nurse

## 2022-06-01 ENCOUNTER — Telehealth: Payer: Self-pay

## 2022-06-01 VITALS — BP 131/88 | HR 78 | Ht 65.0 in | Wt 271.6 lb

## 2022-06-01 DIAGNOSIS — G894 Chronic pain syndrome: Secondary | ICD-10-CM | POA: Diagnosis not present

## 2022-06-01 DIAGNOSIS — M5416 Radiculopathy, lumbar region: Secondary | ICD-10-CM | POA: Insufficient documentation

## 2022-06-01 DIAGNOSIS — M7062 Trochanteric bursitis, left hip: Secondary | ICD-10-CM | POA: Insufficient documentation

## 2022-06-01 DIAGNOSIS — M62830 Muscle spasm of back: Secondary | ICD-10-CM | POA: Insufficient documentation

## 2022-06-01 DIAGNOSIS — G834 Cauda equina syndrome: Secondary | ICD-10-CM | POA: Diagnosis not present

## 2022-06-01 DIAGNOSIS — M7061 Trochanteric bursitis, right hip: Secondary | ICD-10-CM

## 2022-06-01 DIAGNOSIS — Z5181 Encounter for therapeutic drug level monitoring: Secondary | ICD-10-CM | POA: Insufficient documentation

## 2022-06-01 DIAGNOSIS — F411 Generalized anxiety disorder: Secondary | ICD-10-CM | POA: Diagnosis not present

## 2022-06-01 DIAGNOSIS — M961 Postlaminectomy syndrome, not elsewhere classified: Secondary | ICD-10-CM | POA: Diagnosis not present

## 2022-06-01 DIAGNOSIS — Z79891 Long term (current) use of opiate analgesic: Secondary | ICD-10-CM

## 2022-06-01 MED ORDER — OXYCODONE-ACETAMINOPHEN 10-325 MG PO TABS: ORAL_TABLET | ORAL | 0 refills | Status: DC

## 2022-06-01 MED ORDER — DICLOFENAC SODIUM 75 MG PO TBEC: 75.0000 mg | DELAYED_RELEASE_TABLET | Freq: Two times a day (BID) | ORAL | 3 refills | Status: DC

## 2022-06-01 MED ORDER — GABAPENTIN 300 MG PO CAPS: ORAL_CAPSULE | ORAL | 3 refills | Status: DC

## 2022-06-01 NOTE — Telephone Encounter (Signed)
Mychart msg sent

## 2022-06-01 NOTE — Progress Notes (Signed)
Subjective:    Patient ID: [name], female    DOB: [birth year], 52 y.o.   MRN: [id number]  HPI: [name] is a 52 y.o. female who returns for follow up appointment for chronic pain and medication refill. She states her pain is located in her lower back radiating into her left hip pain. She rates her pain 9.Her  current exercise regime is walking and performing stretching exercises.  Ms. Bressman Morphine equivalent is 67.50  MME. She  is also prescribed Alprazolam, she's using it sparingly, last fill date 08/28/2021 .We have discussed the black box warning of using opioids and benzodiazepines. I highlighted the dangers of using these drugs together and discussed the adverse events including respiratory suppression, overdose, cognitive impairment and importance of compliance with current regimen. We will continue to monitor and adjust as indicated.    Last UDS was performed on 05/04/2022 it was consisten.      Pain Inventory Average Pain 9 Pain Right Now 9 My pain is sharp, dull, tingling, and aching  In the last 24 hours, has pain interfered with the following? General activity 9 Relation with others 9 Enjoyment of life 9 What TIME of day is your pain at its worst? morning , daytime, evening, and night Sleep (in general) Poor  Pain is worse with: walking, bending, sitting, and standing Pain improves with: rest, heat/ice, therapy/exercise, and medication Relief from Meds: 2  Family History  Problem Relation Age of Onset   Hypertension Maternal Grandmother    Anesthesia problems Neg Hx    Social History   Socioeconomic History   Marital status: Divorced    Spouse name: Not on file   Number of children: Not on file   Years of education: Not on file   Highest education level: Not on file  Occupational History   Not on file  Tobacco Use   Smoking status: Never   Smokeless tobacco: Never  Vaping Use   Vaping Use: Never used  Substance and Sexual Activity   Alcohol  use: No   Drug use: No   Sexual activity: Not Currently    Birth control/protection: None    Comment: IUD removed 2 wks ago.  Other Topics Concern   Not on file  Social History Narrative   Not on file   Social Determinants of Health   Financial Resource Strain: Not on file  Food Insecurity: Not on file  Transportation Needs: Not on file  Physical Activity: Not on file  Stress: Not on file  Social Connections: Not on file   Past Surgical History:  Procedure Laterality Date   ABDOMINAL HYSTERECTOMY  06/29/11   Robotic Assisted Hysterectomy   CERVICAL SPINE SURGERY  [place]  08/31/2011   Procedure: MICRODISCECTOMY LUMBAR LAMINECTOMY;  Surgeon: Tobi Bastos, MD;  Location: [facility];  Service: Orthopedics;  Laterality: N/A;  lumbar five sacral one central microdisectomy   MYOMECTOMY  13   NECK SURGERY     POSTERIOR CERVICAL FUSION/FORAMINOTOMY N/A 02/02/2013   Procedure: POSTERIOR C5-6 SPINAL FUSION;  Surgeon: Melina Schools, MD;  Location: [facility];  Service: Orthopedics;  Laterality: N/A;   POSTERIOR FUSION CERVICAL SPINE  02/02/2013   C 5 C6     [name]   WISDOM TOOTH EXTRACTION     Past Surgical History:  Procedure Laterality Date   ABDOMINAL HYSTERECTOMY  06/29/11   Robotic Assisted Hysterectomy   CERVICAL SPINE SURGERY  10  [place]   LUMBAR LAMINECTOMY  08/31/2011   Procedure: MICRODISCECTOMY LUMBAR LAMINECTOMY;  Surgeon: Tobi Bastos, MD;  Location: [facility];  Service: Orthopedics;  Laterality: N/A;  lumbar five sacral one central microdisectomy   MYOMECTOMY  13   NECK SURGERY     POSTERIOR CERVICAL FUSION/FORAMINOTOMY N/A 02/02/2013   Procedure: POSTERIOR C5-6 SPINAL FUSION;  Surgeon: Melina Schools, MD;  Location: [facility];  Service: Orthopedics;  Laterality: N/A;   POSTERIOR FUSION CERVICAL SPINE  02/02/2013   C 5 C6     [name]   WISDOM TOOTH EXTRACTION     Past Medical History:  Diagnosis Date   Anemia     hx   Arthritis    Complication of anesthesia    slow to wake up,   very difficult to get iv had picc line last time   Difficult intravenous access    hands are usually best for blood draws and IV starts   DVT (deep venous thrombosis) ([place])    ? vs lupus   Fibroids    Headache(784.0)    Neck pain    Scoliosis    BP 131/88   Pulse 78   Ht '5\' 5"'$  (1.651 m)   Wt 271 lb 9.6 oz (123.2 kg)   LMP 02/27/2011   SpO2 100%   BMI 45.20 kg/m   Opioid Risk Score:   Fall Risk Score:  `1  Depression screen [facility] 2/9     06/01/2022   11:24 AM 05/19/2022   10:44 AM 03/11/2022   11:19 AM 10/15/2021   11:27 AM 07/08/2021   11:27 AM 03/28/2021   11:26 AM 02/28/2021   11:32 AM  Depression screen PHQ 2/9  Decreased Interest 0 0 0 0 0 0 0  Down, Depressed, Hopeless 0 0 0 0 0 0 0  PHQ - 2 Score 0 0 0 0 0 0 0  Altered sleeping       0  PHQ-9 Score       0    Review of Systems  Constitutional: Negative.   HENT: Negative.    Eyes: Negative.   Respiratory: Negative.    Cardiovascular: Negative.   Gastrointestinal: Negative.   Endocrine: Negative.   Genitourinary: Negative.   Musculoskeletal:  Positive for back pain and gait problem.  Skin: Negative.   Allergic/Immunologic: Negative.   Hematological: Negative.   Psychiatric/Behavioral: Negative.    All other systems reviewed and are negative.      Objective:   Physical Exam Vitals and nursing note reviewed.  Constitutional:      Appearance: Normal appearance. She is obese.  Cardiovascular:     Rate and Rhythm: Normal rate and regular rhythm.     Pulses: Normal pulses.     Heart sounds: Normal heart sounds.  Pulmonary:     Effort: Pulmonary effort is normal.     Breath sounds: Normal breath sounds.  Musculoskeletal:     Cervical back: Normal range of motion and neck supple.     Comments: Normal Muscle Bulk and Muscle Testing Reveals:  Upper Extremities: Full ROM and Muscle Strength 5/5 Lumbar Paraspinal Tenderness: L-3-L-5 Left  Greater Trochanter Tenderness Lower Extremities: Full ROM and Muscle Strength 5/5 Arises from Table slowly using cane for support Antalgic  Gait     Skin:    General: Skin is warm and dry.  Neurological:     Mental Status: She is alert and oriented to person, place, and time.  Psychiatric:  Mood and Affect: Mood normal.        Behavior: Behavior normal.         Assessment & Plan:  1. 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 Status post decompressive laminectomy:Continue to Monitor  Refilled: Oxycodone '10mg'$ /325 mg one tablet five times a day as needed #135.  06/01/2022 We will continue the opioid monitoring program, this consists of regular clinic visits, examinations, urine drug screen, pill counts as well as use of [place] Controlled Substance Reporting system. A 12 month History has been reviewed on the [place] on 06/01/2022. 2. Insomnia/ Anxiety: Continue  Xanax to 0.5 mg at HS, educated on the [employer], she verbalizes understanding. 06/01/2022 3. Lumbar Radiculopathy/Neuropathic pain: Continue current medication regimen with Gabapentin. 06/01/2022 4. Muscle Spasms. Continue current medication regimen with: Tizanidine. 06/01/2022 5. Neurogenic bowel and bladder. Continue Bowel Regimen. Continue to monitor. 06/01/2022 6. Constipation/Opioid Therapy: Continue current medication regimen with  Linzess. 06/01/2022 7. Depression: Continue current medication regimen with Celexa. Continue to monitor. 06/01/2022 8. Left  Greater Trochanteric Bursitis:  Continue  Heat/Ice Therapy. 06/01/2022 9. Chronic Pain Syndrome: Continue current medication regimen with Voltaren. 06/01/2022 10. Chronic Right Knee Pain: No complaints today. Continue HEP as Tolerated. Continue current medication regimen and continue to monitor. 06/01/2022 11. Cervicalgia/ Cervical Radiculitis: No complaints today. Continue  current medication regimen and HEP as tolerated.. Continue to Monitor. 06/01/2022 .    F/U in 2 months

## 2022-06-02 ENCOUNTER — Telehealth: Payer: Self-pay | Admitting: Registered Nurse

## 2022-06-02 NOTE — Telephone Encounter (Signed)
Patient needs PA for Oxycodone.

## 2022-06-03 ENCOUNTER — Telehealth: Payer: Self-pay | Admitting: *Deleted

## 2022-06-03 NOTE — Telephone Encounter (Signed)
Sent in  to insurance.

## 2022-06-03 NOTE — Telephone Encounter (Signed)
Prior auth for oxycodone acetaminophen 10/325 #135 submitted to [facility] via [employer].

## 2022-06-04 NOTE — Telephone Encounter (Signed)
Approved on March 13 Request Reference Number: [id number]. OXYCOD/APAP TAB 10-'325MG'$  is approved through 12/04/2022. For further questions, call [employer] at [phone].

## 2022-06-05 ENCOUNTER — Encounter: Payer: Self-pay | Admitting: Physical Therapy

## 2022-06-05 ENCOUNTER — Ambulatory Visit: Payer: Medicaid Other | Attending: Physical Medicine & Rehabilitation | Admitting: Physical Therapy

## 2022-06-05 ENCOUNTER — Other Ambulatory Visit: Payer: Self-pay

## 2022-06-05 DIAGNOSIS — M5459 Other low back pain: Secondary | ICD-10-CM | POA: Diagnosis present

## 2022-06-05 DIAGNOSIS — M961 Postlaminectomy syndrome, not elsewhere classified: Secondary | ICD-10-CM | POA: Insufficient documentation

## 2022-06-05 DIAGNOSIS — M79605 Pain in left leg: Secondary | ICD-10-CM | POA: Diagnosis present

## 2022-06-05 DIAGNOSIS — M5416 Radiculopathy, lumbar region: Secondary | ICD-10-CM | POA: Diagnosis not present

## 2022-06-05 DIAGNOSIS — M6281 Muscle weakness (generalized): Secondary | ICD-10-CM | POA: Diagnosis present

## 2022-06-05 NOTE — Therapy (Signed)
OUTPATIENT PHYSICAL THERAPY EVALUATION   Patient Name: [name] MRN: [id number] DOB:07/19/1970, 52 y.o., female Today's Date: 06/05/2022   END OF SESSION:  PT End of Session - 06/05/22 1014     Visit Number 1    Number of Visits 17    Date for PT Re-Evaluation 07/31/22    Authorization Type [employer]    Authorization - Number of Visits 27    PT Start Time 1015    PT Stop Time 1100    PT Time Calculation (min) 45 min    Activity Tolerance Patient tolerated treatment well    Behavior During Therapy WFL for tasks assessed/performed             Past Medical History:  Diagnosis Date   Anemia    hx   Arthritis    Complication of anesthesia    slow to wake up,   very difficult to get iv had picc line last time   Difficult intravenous access    hands are usually best for blood draws and IV starts   DVT (deep venous thrombosis) ([place])    ? vs lupus   Fibroids    Headache(784.0)    Neck pain    Scoliosis    Past Surgical History:  Procedure Laterality Date   ABDOMINAL HYSTERECTOMY  06/29/11   Robotic Assisted Hysterectomy   CERVICAL SPINE SURGERY  [place]   LUMBAR LAMINECTOMY  08/31/2011   Procedure: MICRODISCECTOMY LUMBAR LAMINECTOMY;  Surgeon: Tobi Bastos, MD;  Location: [facility];  Service: Orthopedics;  Laterality: N/A;  lumbar five sacral one central microdisectomy   MYOMECTOMY  13   NECK SURGERY     POSTERIOR CERVICAL FUSION/FORAMINOTOMY N/A 02/02/2013   Procedure: POSTERIOR C5-6 SPINAL FUSION;  Surgeon: Melina Schools, MD;  Location: [facility];  Service: Orthopedics;  Laterality: N/A;   POSTERIOR FUSION CERVICAL SPINE  02/02/2013   C 5 C6     [name]   WISDOM TOOTH EXTRACTION     Patient Active Problem List   Diagnosis Date Noted   Greater trochanteric bursitis of right hip [id number]   Metabolic syndrome 123456   Other constipation 05/24/2014   Greater trochanteric bursitis of left hip 09/08/2013   UTI (urinary tract infection)  09/03/2011   Hypokalemia 09/03/2011   Cauda equina syndrome ([place]) 09/03/2011   Spinal stenosis of lumbar region 08/31/2011   Lumbar herniated disc 08/31/2011   Abdominal pain, acute, generalized 08/28/2011   Nausea and vomiting 08/28/2011   Dehydration 08/28/2011   S/P hysterectomy 4/13 08/28/2011   Menometrorrhagia 05/21/2011   DVT of right leg (deep venous thrombosis) in 01/2011 05/21/2011   Fibroid uterus 03/20/2011   LBP (low back pain) 05/29/2010   Avitaminosis D 04/23/2010   Benign essential HTN 04/21/2010   Essential (primary) hypertension 04/21/2010   Allergic rhinitis 11/01/2009   Absolute anemia 11/01/2009   Headache, migraine 11/01/2009   Adiposity 11/01/2009    PCP: Willey Blade, MD  REFERRING PROVIDER: Charlett Blake, MD  REFERRING DIAG: Lumbar radiculitis; Lumbar post-laminectomy syndrome  Rationale for Evaluation and Treatment: Rehabilitation  THERAPY DIAG:  Other low back pain  Pain in left leg  Muscle weakness (generalized)  ONSET DATE: 2013   SUBJECTIVE:  SUBJECTIVE STATEMENT: Patient reports she had back surgery in 2013, but lately she has been having some sciatica and has tried sciatica. She has pain in the lower back that shoots mainly down the back of the left leg. She also gets numbness and tingling in the back of her legs, and her legs can occasionally feel like jelly. She did have an episode where she almost fell when her legs gave out. Every now and then she can get pain into the calf and feet. The pain is constant, but nights are worse. Doing a lot of sitting or walking and bending will aggravate her pain. Patient walks with a cane and has been using it since 2013, she uses it on the right side.   PERTINENT HISTORY:  L5-S1 decompression microdiscectomy  and laminectomy 2013  Posterior C5-C6 fusion 2014  PAIN:  Are you having pain? Yes:  NPRS scale: 9/10 (10/10 at worse) Pain location: Low back, left hip and back of leg Pain description: Aching and stabbing, shooting, numbness, tingling Aggravating factors: Sitting or walking and bending Relieving factors: Medication, a little exercise, heating pad  PRECAUTIONS: None  WEIGHT BEARING RESTRICTIONS: No  FALLS:  Has patient fallen in last 6 months? No  OCCUPATION: Part-time, mainly sitting  PLOF: Independent  PATIENT GOALS: Pain relief   OBJECTIVE:  PATIENT SURVEYS:  Modified Oswestry 60% disability   SCREENING FOR RED FLAGS: Negative  COGNITION: Overall cognitive status: Within functional limits for tasks assessed     SENSATION: Patient report sensation deficit of left thight  MUSCLE LENGTH: Flexibility deficits noted bilateral hamstring (left worse than right) and hip musculature  POSTURE:   Increased lumbar lordosis  PALPATION: Global tenderness to palpation of lumbar and left gluteal region  LUMBAR ROM:   AROM eval  Flexion 25% - fingertips to knees  Extension 50%  Right lateral flexion 75%  Left lateral flexion 50%  Right rotation 50%  Left rotation 25%   (Blank rows = not tested)  Patient reports low back pain and left hip/posterior thigh pain with all lumbar movement  LOWER EXTREMITY MMT:    MMT Right eval Left eval  Hip flexion 4 4-  Hip extension 3 3  Hip abduction 3 3  Hip adduction    Hip internal rotation    Hip external rotation    Knee flexion 5 4+  Knee extension 5 5  Ankle dorsiflexion 5 4+  Ankle plantarflexion    Ankle inversion    Ankle eversion 5 4+   (Blank rows = not tested)  LUMBAR SPECIAL TESTS:  Slump positive on left  FUNCTIONAL TESTS:  Unable to perform DLLT  GAIT: Assistive device utilized: Single point cane Level of assistance: Modified independence Comments: Slightly antalgic on the left   TODAY'S  TREATMENT:   [facility] Adult PT Treatment:                                                DATE: 06/05/2022 Therapeutic Exercise: Supine PPT (cued for glute set) 5 x 5 sec hold LTR x 5 Supine Piriformis stretch using towel around thigh 2 x 30 sec each  PATIENT EDUCATION:  Education details: Exam findings, POC, HEP Person educated: Patient Education method: Explanation, Demonstration, Tactile cues, Verbal cues, and Handouts Education comprehension: verbalized understanding, returned demonstration, verbal cues required, tactile cues required, and needs further education  HOME EXERCISE PROGRAM: Access Code: [email or web address]    ASSESSMENT: CLINICAL IMPRESSION: Patient is a 52 y.o. female who was seen today for physical therapy evaluation and treatment for chronic low back pain with left sided radicular symptoms. She has history of previous L5-S1 laminectomy and has been ambulating with SPC since that time. She reports high levels of pain and symptom irritability. She demonstrates limitations in lumbar motion, hip, core, and left leg strength, and gait and mobility limitations that impact her functional ability.    OBJECTIVE IMPAIRMENTS: Abnormal gait, decreased activity tolerance, difficulty walking, decreased ROM, decreased strength, impaired flexibility, postural dysfunction, and pain.   ACTIVITY LIMITATIONS: carrying, lifting, bending, sitting, standing, squatting, sleeping, transfers, bed mobility, bathing, hygiene/grooming, and locomotion level  PARTICIPATION LIMITATIONS: meal prep, cleaning, laundry, driving, shopping, community activity, and occupation  PERSONAL FACTORS: Fitness, Past/current experiences, Time since onset of injury/illness/exacerbation, and 1 comorbidity: see PMH above  are also affecting patient's functional outcome.   REHAB POTENTIAL: Good  CLINICAL DECISION MAKING: Stable/uncomplicated  EVALUATION COMPLEXITY: Low   GOALS: Goals reviewed with patient? Yes  SHORT TERM  GOALS: Target date: 07/03/2022  Patient will be I with initial HEP in order to progress with therapy. Baseline: HEP provided at eval Goal status: INITIAL  2.  Patient will report </= 7/10 pain in order to reduce functional limitations Baseline: 9/10 Goal status: INITIAL  LONG TERM GOALS: Target date: 07/31/2022  Patient will be I with final HEP to maintain progress from PT. Baseline: HEP provided at eval Goal status: INITIAL  2.  Patient will report Modified Oswestry </= 40% in order to indicate improved functional ability and reduced pain Baseline: 60% Goal status: INITIAL  3.  Patient will demonstrate a 25% improvement in lumbar AROM in order to improve dressing and self care ability Baseline: see lumbar AROM limitations noted above Goal status: INITIAL  4.  Patient will exhibit improved hip strength >/= 4-/5 MMT in order to improve standing and walking tolerance Baseline: 3/5 MMT Goal status: INITIAL   PLAN: PT FREQUENCY: 1-2x/week  PT DURATION: 8 weeks  PLANNED INTERVENTIONS: Therapeutic exercises, Therapeutic activity, Neuromuscular re-education, Balance training, Gait training, Patient/Family education, Self Care, Joint mobilization, Aquatic Therapy, Dry Needling, Electrical stimulation, Cryotherapy, Moist heat, Manual therapy, and Re-evaluation.  PLAN FOR NEXT SESSION: Review HEP and progress PRN, manual/modalities for lumbar pain relief, progress lumbar and hip stretching and initiate core stabilization and hip strengthening as tolerated   Hilda Blades, PT, DPT, LAT, ATC 06/05/22  2:17 PM Phone: [phone] Fax: [phone]   Check all possible CPT codes: [id number] - PT Re-evaluation, 97110- Therapeutic Exercise, [phone]- Neuro Re-education, [phone] - Gait Training, [phone] - Manual Therapy, 97530 - Therapeutic Activities, 97535 - Self Care, [id number] - Electrical stimulation (unattended), and [id number] - Aquatic therapy    Check all conditions that are expected to impact  treatment: Morbid obesity and Musculoskeletal disorders   If treatment provided at initial evaluation, no treatment charged due to lack of authorization.

## 2022-06-05 NOTE — Patient Instructions (Signed)
Access Code: [email or web address] URL: [email or web address] Date: 06/05/2022 Prepared by: Hilda Blades  Exercises - Supine Posterior Pelvic Tilt  - 2 x daily - 10 reps - a few seconds hold - Supine Lower Trunk Rotation  - 2 x daily - 10 reps - a few seconds hold - Supine Piriformis Stretch with Foot on Ground  - 2 x daily - 3 reps - 30 second hold

## 2022-06-16 ENCOUNTER — Ambulatory Visit: Payer: Medicaid Other | Admitting: Physical Therapy

## 2022-06-23 ENCOUNTER — Encounter: Payer: Self-pay | Admitting: Physical Therapy

## 2022-06-23 ENCOUNTER — Ambulatory Visit: Payer: Medicaid Other | Attending: Physical Medicine & Rehabilitation | Admitting: Physical Therapy

## 2022-06-23 DIAGNOSIS — M79605 Pain in left leg: Secondary | ICD-10-CM

## 2022-06-23 DIAGNOSIS — M6281 Muscle weakness (generalized): Secondary | ICD-10-CM | POA: Diagnosis present

## 2022-06-23 DIAGNOSIS — M5459 Other low back pain: Secondary | ICD-10-CM | POA: Insufficient documentation

## 2022-06-23 NOTE — Therapy (Signed)
OUTPATIENT PHYSICAL THERAPY TREATMENT NOTE   Patient Name: [name] MRN: [id number] DOB:April 24, 1970, 52 y.o., female Today's Date: 06/23/2022  PCP: Willey Blade, MD   REFERRING PROVIDER: Charlett Blake, MD  END OF SESSION:   PT End of Session - 06/23/22 1145     Visit Number 2    Number of Visits 17    Date for PT Re-Evaluation 07/31/22    Authorization Type [employer]- no auth for 27    Authorization - Number of Visits 27    PT Start Time 1146    PT Stop Time 1230    PT Time Calculation (min) 44 min             Past Medical History:  Diagnosis Date   Anemia    hx   Arthritis    Complication of anesthesia    slow to wake up,   very difficult to get iv had picc line last time   Difficult intravenous access    hands are usually best for blood draws and IV starts   DVT (deep venous thrombosis)    ? vs lupus   Fibroids    Headache(784.0)    Neck pain    Scoliosis    Past Surgical History:  Procedure Laterality Date   ABDOMINAL HYSTERECTOMY  06/29/11   Robotic Assisted Hysterectomy   CERVICAL SPINE SURGERY  [place]   LUMBAR LAMINECTOMY  08/31/2011   Procedure: MICRODISCECTOMY LUMBAR LAMINECTOMY;  Surgeon: Tobi Bastos, MD;  Location: [facility];  Service: Orthopedics;  Laterality: N/A;  lumbar five sacral one central microdisectomy   MYOMECTOMY  13   NECK SURGERY     POSTERIOR CERVICAL FUSION/FORAMINOTOMY N/A 02/02/2013   Procedure: POSTERIOR C5-6 SPINAL FUSION;  Surgeon: Melina Schools, MD;  Location: [facility];  Service: Orthopedics;  Laterality: N/A;   POSTERIOR FUSION CERVICAL SPINE  02/02/2013   C 5 C6     [name]   WISDOM TOOTH EXTRACTION     Patient Active Problem List   Diagnosis Date Noted   Greater trochanteric bursitis of right hip [id number]   Metabolic syndrome [id number]   Other constipation 05/24/2014   Greater trochanteric bursitis of left hip 09/08/2013   UTI (urinary tract infection) 09/03/2011   Hypokalemia  09/03/2011   Cauda equina syndrome 09/03/2011   Spinal stenosis of lumbar region 08/31/2011   Lumbar herniated disc 08/31/2011   Abdominal pain, acute, generalized 08/28/2011   Nausea and vomiting 08/28/2011   Dehydration 08/28/2011   S/P hysterectomy 4/13 08/28/2011   Menometrorrhagia 05/21/2011   DVT of right leg (deep venous thrombosis) in 01/2011 05/21/2011   Fibroid uterus 03/20/2011   LBP (low back pain) 05/29/2010   Avitaminosis D 04/23/2010   Benign essential HTN 04/21/2010   Essential (primary) hypertension 04/21/2010   Allergic rhinitis 11/01/2009   Absolute anemia 11/01/2009   Headache, migraine 11/01/2009   Adiposity 11/01/2009    REFERRING DIAG: Lumbar radiculitis; Lumbar post-laminectomy syndrome  THERAPY DIAG:  Other low back pain  Pain in left leg  Muscle weakness (generalized)  Rationale for Evaluation and Treatment Rehabilitation  PERTINENT HISTORY:  L5-S1 decompression microdiscectomy and laminectomy 2013  Posterior C5-C6 fusion 2014  PRECAUTIONS: None  SUBJECTIVE:  SUBJECTIVE STATEMENT:  I am doing okay. I did the exercises. I like the one pulling my knee up.    PAIN:  Are you having pain? Yes:  NPRS scale: 8/10 (10/10 at worse) Pain location: Low back, left hip and back of leg Pain description: Aching and stabbing, shooting, numbness, tingling Aggravating factors: Sitting or walking and bending Relieving factors: Medication, a little exercise, heating pad   OBJECTIVE: (objective measures completed at initial evaluation unless otherwise dated)   PATIENT SURVEYS:  Modified Oswestry 60% disability    SCREENING FOR RED FLAGS: Negative   COGNITION: Overall cognitive status: Within functional limits for tasks assessed                          SENSATION: Patient  report sensation deficit of left thight   MUSCLE LENGTH: Flexibility deficits noted bilateral hamstring (left worse than right) and hip musculature   POSTURE:             Increased lumbar lordosis   PALPATION: Global tenderness to palpation of lumbar and left gluteal region   LUMBAR ROM:    AROM eval  Flexion 25% - fingertips to knees  Extension 50%  Right lateral flexion 75%  Left lateral flexion 50%  Right rotation 50%  Left rotation 25%   (Blank rows = not tested)   Patient reports low back pain and left hip/posterior thigh pain with all lumbar movement   LOWER EXTREMITY MMT:     MMT Right eval Left eval  Hip flexion 4 4-  Hip extension 3 3  Hip abduction 3 3  Hip adduction      Hip internal rotation      Hip external rotation      Knee flexion 5 4+  Knee extension 5 5  Ankle dorsiflexion 5 4+  Ankle plantarflexion      Ankle inversion      Ankle eversion 5 4+   (Blank rows = not tested)   LUMBAR SPECIAL TESTS:  Slump positive on left   FUNCTIONAL TESTS:  Unable to perform DLLT   GAIT: Assistive device utilized: Single point cane Level of assistance: Modified independence Comments: Slightly antalgic on the left     TODAY'S TREATMENT:   [facility] Adult PT Treatment:                                                DATE: 06/23/22 Therapeutic Exercise: Nustep L4 UE/LE x 5 minutes  Supine PPT 10 x 2 with cues for breathing PPT to Bridge x 6 LTR x 10 Piriformis stretch - with towel assist 2 x 30 sec  SKTC with towel 2 x 30 sec  Green band Clam  10 x 2, cues for abdominal draw in Piriformis stretch x 1 each - no towel needed Seated lumbar flexion with ball 10 sec x 3     [facility] Adult PT Treatment:                                                DATE: 06/05/2022 Therapeutic Exercise: Supine PPT (cued for glute set) 5 x 5 sec hold LTR x 5 Supine Piriformis stretch using towel around thigh 2  x 30 sec each   PATIENT EDUCATION:  Education details: Exam  findings, POC, HEP Person educated: Patient Education method: Explanation, Demonstration, Tactile cues, Verbal cues, and Handouts Education comprehension: verbalized understanding, returned demonstration, verbal cues required, tactile cues required, and needs further education   HOME EXERCISE PROGRAM: Access Code: [email or web address]  Exercises - Supine Posterior Pelvic Tilt  - 2 x daily - 10 reps - a few seconds hold - Supine Lower Trunk Rotation  - 2 x daily - 10 reps - a few seconds hold - Supine Piriformis Stretch with Foot on Ground  - 2 x daily - 3 reps - 30 second hold Added - Hooklying Clamshell with Resistance  - 2 x daily - 7 x weekly - 2 sets - 10 reps - 5 hold - Supine Bridge  - 1 x daily - 7 x weekly - 2 sets - 10 reps     ASSESSMENT: CLINICAL IMPRESSION: Patient is a 52 y.o. female who was seen today for physical therapy treatment for chronic low back pain with left sided radicular symptoms. She has history of previous L5-S1 laminectomy and has been ambulating with SPC since that time. She arrives with 8/10 pain and reports compliance with HEP, likes knee to chest stretch. Reviewed HEP and progressed with hip strengthening and lumbar stretching. Her HEP was updated. At end of session she reported that her legs felt like jelly.       OBJECTIVE IMPAIRMENTS: Abnormal gait, decreased activity tolerance, difficulty walking, decreased ROM, decreased strength, impaired flexibility, postural dysfunction, and pain.    ACTIVITY LIMITATIONS: carrying, lifting, bending, sitting, standing, squatting, sleeping, transfers, bed mobility, bathing, hygiene/grooming, and locomotion level   PARTICIPATION LIMITATIONS: meal prep, cleaning, laundry, driving, shopping, community activity, and occupation   PERSONAL FACTORS: Fitness, Past/current experiences, Time since onset of injury/illness/exacerbation, and 1 comorbidity: see PMH above  are also affecting patient's functional outcome.    REHAB POTENTIAL:  Good   CLINICAL DECISION MAKING: Stable/uncomplicated   EVALUATION COMPLEXITY: Low     GOALS: Goals reviewed with patient? Yes   SHORT TERM GOALS: Target date: 07/03/2022   Patient will be I with initial HEP in order to progress with therapy. Baseline: HEP provided at eval Goal status: INITIAL   2.  Patient will report </= 7/10 pain in order to reduce functional limitations Baseline: 9/10 Goal status: INITIAL   LONG TERM GOALS: Target date: 07/31/2022   Patient will be I with final HEP to maintain progress from PT. Baseline: HEP provided at eval Goal status: INITIAL   2.  Patient will report Modified Oswestry </= 40% in order to indicate improved functional ability and reduced pain Baseline: 60% Goal status: INITIAL   3.  Patient will demonstrate a 25% improvement in lumbar AROM in order to improve dressing and self care ability Baseline: see lumbar AROM limitations noted above Goal status: INITIAL   4.  Patient will exhibit improved hip strength >/= 4-/5 MMT in order to improve standing and walking tolerance Baseline: 3/5 MMT Goal status: INITIAL     PLAN: PT FREQUENCY: 1-2x/week   PT DURATION: 8 weeks   PLANNED INTERVENTIONS: Therapeutic exercises, Therapeutic activity, Neuromuscular re-education, Balance training, Gait training, Patient/Family education, Self Care, Joint mobilization, Aquatic Therapy, Dry Needling, Electrical stimulation, Cryotherapy, Moist heat, Manual therapy, and Re-evaluation.   PLAN FOR NEXT SESSION: Review HEP and progress PRN, manual/modalities for lumbar pain relief, progress lumbar and hip stretching and initiate core stabilization and hip strengthening as tolerated  Hessie Diener, PTA 06/23/22 12:45 PM Phone: [phone] Fax: [phone]

## 2022-06-25 NOTE — Therapy (Signed)
OUTPATIENT PHYSICAL THERAPY TREATMENT NOTE   Patient Name: [name] MRN: [id number] DOB:06/26/1970, 52 y.o., female Today's Date: 06/26/2022  PCP: Andi Devon, MD REFERRING PROVIDER: Erick Colace, MD   END OF SESSION:   PT End of Session - 06/26/22 1059     Visit Number 3    Number of Visits 17    Date for PT Re-Evaluation 07/31/22    Authorization Type [employer]    Authorization - Number of Visits 27    PT Start Time 1100    PT Stop Time 1140    PT Time Calculation (min) 40 min    Activity Tolerance Patient tolerated treatment well    Behavior During Therapy WFL for tasks assessed/performed              Past Medical History:  Diagnosis Date   Anemia    hx   Arthritis    Complication of anesthesia    slow to wake up,   very difficult to get iv had picc line last time   Difficult intravenous access    hands are usually best for blood draws and IV starts   DVT (deep venous thrombosis)    ? vs lupus   Fibroids    Headache(784.0)    Neck pain    Scoliosis    Past Surgical History:  Procedure Laterality Date   ABDOMINAL HYSTERECTOMY  06/29/11   Robotic Assisted Hysterectomy   CERVICAL SPINE SURGERY  10   CESAREAN SECTION  1996   LUMBAR LAMINECTOMY  08/31/2011   Procedure: MICRODISCECTOMY LUMBAR LAMINECTOMY;  Surgeon: Jacki Cones, MD;  Location: [facility];  Service: Orthopedics;  Laterality: N/A;  lumbar five sacral one central microdisectomy   MYOMECTOMY  13   NECK SURGERY     POSTERIOR CERVICAL FUSION/FORAMINOTOMY N/A 02/02/2013   Procedure: POSTERIOR C5-6 SPINAL FUSION;  Surgeon: Venita Lick, MD;  Location: [facility];  Service: Orthopedics;  Laterality: N/A;   POSTERIOR FUSION CERVICAL SPINE  02/02/2013   C 5 C6     Dr Shon Baton   WISDOM TOOTH EXTRACTION     Patient Active Problem List   Diagnosis Date Noted   Greater trochanteric bursitis of right hip 02/12/2015   Metabolic syndrome 08/27/2014   Other constipation 05/24/2014   Greater  trochanteric bursitis of left hip 09/08/2013   UTI (urinary tract infection) 09/03/2011   Hypokalemia 09/03/2011   Cauda equina syndrome 09/03/2011   Spinal stenosis of lumbar region 08/31/2011   Lumbar herniated disc 08/31/2011   Abdominal pain, acute, generalized 08/28/2011   Nausea and vomiting 08/28/2011   Dehydration 08/28/2011   S/P hysterectomy 4/13 08/28/2011   Menometrorrhagia 05/21/2011   DVT of right leg (deep venous thrombosis) in 01/2011 05/21/2011   Fibroid uterus 03/20/2011   LBP (low back pain) 05/29/2010   Avitaminosis D 04/23/2010   Benign essential HTN 04/21/2010   Essential (primary) hypertension 04/21/2010   Allergic rhinitis 11/01/2009   Absolute anemia 11/01/2009   Headache, migraine 11/01/2009   Adiposity 11/01/2009    REFERRING DIAG: Lumbar radiculitis; Lumbar post-laminectomy syndrome  THERAPY DIAG:  Other low back pain  Pain in left leg  Muscle weakness (generalized)  Rationale for Evaluation and Treatment Rehabilitation  PERTINENT HISTORY: L5-S1 decompression microdiscectomy and laminectomy 2013; Posterior C5-C6 fusion 2014  PRECAUTIONS: None   SUBJECTIVE:  SUBJECTIVE STATEMENT:  Patient reports she was tired after last visit and had some spasms in her lower back but nothing she couldn't handle. States exercises at home are going great, she does them three times per day. She does report that the numbness in the left leg has improved, still gets it occasionally.   PAIN:  Are you having pain? Yes:  NPRS scale: 8/10 (10/10 at worse) Pain location: Low back, left hip and back of leg Pain description: Aching and stabbing, shooting, numbness, tingling Aggravating factors: Sitting or walking and bending Relieving factors: Medication, a little exercise, heating  pad   OBJECTIVE: (objective measures completed at initial evaluation unless otherwise dated) PATIENT SURVEYS:  Modified Oswestry 60% disability           SENSATION: Patient report sensation deficit of left thight   MUSCLE LENGTH: Flexibility deficits noted bilateral hamstring (left worse than right) and hip musculature   POSTURE:             Increased lumbar lordosis   PALPATION: Global tenderness to palpation of lumbar and left gluteal region   LUMBAR ROM:    AROM eval 06/26/22  Flexion 25% - fingertips to knees 50% fingertips proximal shin  Extension 50%   Right lateral flexion 75%   Left lateral flexion 50%   Right rotation 50%   Left rotation 25%    (Blank rows = not tested)   Patient reports low back pain and left hip/posterior thigh pain with all lumbar movement   LOWER EXTREMITY MMT:     MMT Right eval Left eval  Hip flexion 4 4-  Hip extension 3 3  Hip abduction 3 3  Hip adduction      Hip internal rotation      Hip external rotation      Knee flexion 5 4+  Knee extension 5 5  Ankle dorsiflexion 5 4+  Ankle plantarflexion      Ankle inversion      Ankle eversion 5 4+   (Blank rows = not tested)   LUMBAR SPECIAL TESTS:  Slump positive on left   FUNCTIONAL TESTS:  Unable to perform DLLT   GAIT: Assistive device utilized: Single point cane Level of assistance: Modified independence Comments: Slightly antalgic on the left     TODAY'S TREATMENT:   [facility] Adult PT Treatment:                                                DATE: 06/26/22 Therapeutic Exercise: Nustep L5 x 5 min with UE/LE while taking subjective Seated hamstring stretch 2 x 20 sec  Seated lumbar flexion physioball roll-out 5 x 10 sec Seated physioball press abdominal set 10 x 5 sec LTR x 10 Wind shield wipers x 10 Piriformis stretch 2 x 20 sec Bridge partial range 2 x 10 SLR with abdominal engagement x 10 each Hooklying clamshell with blue x 10 Standing alternating march with focus  on core and gluteal activation of stance leg and right UE support 2 x 10   [facility] Adult PT Treatment:                                                DATE: 06/23/22 Therapeutic Exercise:  Nustep L4 UE/LE x 5 minutes  Supine PPT 10 x 2 with cues for breathing PPT to Bridge x 6 LTR x 10 Piriformis stretch - with towel assist 2 x 30 sec  SKTC with towel 2 x 30 sec  Green band Clam  10 x 2, cues for abdominal draw in Piriformis stretch x 1 each - no towel needed Seated lumbar flexion with ball 10 sec x 3   [facility] Adult PT Treatment:                                                DATE: 06/05/2022 Therapeutic Exercise: Supine PPT (cued for glute set) 5 x 5 sec hold LTR x 5 Supine Piriformis stretch using towel around thigh 2 x 30 sec each   PATIENT EDUCATION:  Education details: HEP Person educated: Patient Education method: Programmer, multimedia, Demonstration, Tactile cues, Verbal cues Education comprehension: verbalized understanding, returned demonstration, verbal cues required, tactile cues required, and needs further education   HOME EXERCISE PROGRAM: Access Code: [email or web address]     ASSESSMENT: CLINICAL IMPRESSION: Patient tolerated therapy well with no adverse effects. Therapy focused on continued mobility and stretching for the lumbar and hip region, and progressing her core and hip strengthening with good tolerance. She was able to tolerated progressions in exercise and resistance well, she did report onset of numbness with standing marching exercise and require cueing for gluteal engagement to improve lumbopelvic control and stability with walking. No changes to her HEP this visit, but provided blue band for increased resistance. Patient would benefit from continued skilled PT to progress her mobility and strength in order to reduce pain and maximize functional ability.     OBJECTIVE IMPAIRMENTS: Abnormal gait, decreased activity tolerance, difficulty walking, decreased ROM, decreased strength,  impaired flexibility, postural dysfunction, and pain.    ACTIVITY LIMITATIONS: carrying, lifting, bending, sitting, standing, squatting, sleeping, transfers, bed mobility, bathing, hygiene/grooming, and locomotion level   PARTICIPATION LIMITATIONS: meal prep, cleaning, laundry, driving, shopping, community activity, and occupation   PERSONAL FACTORS: Fitness, Past/current experiences, Time since onset of injury/illness/exacerbation, and 1 comorbidity: see PMH above  are also affecting patient's functional outcome.      GOALS: Goals reviewed with patient? Yes   SHORT TERM GOALS: Target date: 07/03/2022   Patient will be I with initial HEP in order to progress with therapy. Baseline: HEP provided at eval Goal status: INITIAL   2.  Patient will report </= 7/10 pain in order to reduce functional limitations Baseline: 9/10 Goal status: INITIAL   LONG TERM GOALS: Target date: 07/31/2022   Patient will be I with final HEP to maintain progress from PT. Baseline: HEP provided at eval Goal status: INITIAL   2.  Patient will report Modified Oswestry </= 40% in order to indicate improved functional ability and reduced pain Baseline: 60% Goal status: INITIAL   3.  Patient will demonstrate a 25% improvement in lumbar AROM in order to improve dressing and self care ability Baseline: see lumbar AROM limitations noted above Goal status: INITIAL   4.  Patient will exhibit improved hip strength >/= 4-/5 MMT in order to improve standing and walking tolerance Baseline: 3/5 MMT Goal status: INITIAL     PLAN: PT FREQUENCY: 1-2x/week   PT DURATION: 8 weeks   PLANNED INTERVENTIONS: Therapeutic exercises, Therapeutic activity, Neuromuscular re-education, Balance training, Gait training, Patient/Family education, Self  Care, Joint mobilization, Aquatic Therapy, Dry Needling, Electrical stimulation, Cryotherapy, Moist heat, Manual therapy, and Re-evaluation.   PLAN FOR NEXT SESSION: Review HEP and  progress PRN, manual/modalities for lumbar pain relief, progress lumbar and hip stretching and initiate core stabilization and hip strengthening as tolerated     Rosana Hoesampbell Emmamarie Kluender, PT, DPT, LAT, ATC 06/26/22  11:44 AM Phone: [phone] Fax: [phone]

## 2022-06-26 ENCOUNTER — Encounter: Payer: Self-pay | Admitting: Physical Therapy

## 2022-06-26 ENCOUNTER — Other Ambulatory Visit: Payer: Self-pay

## 2022-06-26 ENCOUNTER — Ambulatory Visit: Payer: Medicaid Other | Admitting: Physical Therapy

## 2022-06-26 DIAGNOSIS — M5459 Other low back pain: Secondary | ICD-10-CM

## 2022-06-26 DIAGNOSIS — M6281 Muscle weakness (generalized): Secondary | ICD-10-CM

## 2022-06-26 DIAGNOSIS — M79605 Pain in left leg: Secondary | ICD-10-CM

## 2022-06-26 NOTE — Patient Instructions (Signed)
Access Code: [email or web address] URL: [email or web address] Date: 06/26/2022 Prepared by: Rosana Hoes  Exercises - Supine Posterior Pelvic Tilt  - 2 x daily - 10 reps - a few seconds hold - Supine Lower Trunk Rotation  - 2 x daily - 10 reps - a few seconds hold - Supine Hip Internal and External Rotation  - 2 x daily - 10 reps - a few seconds hold - Supine Piriformis Stretch with Foot on Ground  - 2 x daily - 3 reps - 30 second hold - Hooklying Clamshell with Resistance  - 1 x daily - 2 sets - 10 reps - 5 hold - Supine Bridge  - 1 x daily - 2 sets - 10 reps - Straight Leg Raise  - 1 x daily - 2 sets - 10 reps

## 2022-06-29 ENCOUNTER — Ambulatory Visit: Payer: Medicaid Other | Admitting: Physical Therapy

## 2022-06-29 ENCOUNTER — Encounter: Payer: Self-pay | Admitting: Physical Therapy

## 2022-06-29 DIAGNOSIS — M79605 Pain in left leg: Secondary | ICD-10-CM

## 2022-06-29 DIAGNOSIS — M6281 Muscle weakness (generalized): Secondary | ICD-10-CM

## 2022-06-29 DIAGNOSIS — M5459 Other low back pain: Secondary | ICD-10-CM

## 2022-06-29 NOTE — Therapy (Signed)
OUTPATIENT PHYSICAL THERAPY TREATMENT NOTE   Patient Name: [name] MRN: [id number] DOB:[birth year], 52 y.o., female Today's Date: 06/29/2022  PCP: Andi DevonShelton, Kimberly, MD REFERRING PROVIDER: Erick ColaceKirsteins, Andrew E, MD   END OF SESSION:   PT End of Session - 06/29/22 1105     Visit Number 4    Number of Visits 17    Date for PT Re-Evaluation 07/31/22    Authorization Type [employer]    Authorization - Number of Visits 27    PT Start Time 1103    PT Stop Time 1144    PT Time Calculation (min) 41 min              Past Medical History:  Diagnosis Date   Anemia    hx   Arthritis    Complication of anesthesia    slow to wake up,   very difficult to get iv had picc line last time   Difficult intravenous access    hands are usually best for blood draws and IV starts   DVT (deep venous thrombosis)    ? vs lupus   Fibroids    Headache(784.0)    Neck pain    Scoliosis    Past Surgical History:  Procedure Laterality Date   ABDOMINAL HYSTERECTOMY  06/29/11   Robotic Assisted Hysterectomy   CERVICAL SPINE SURGERY  10   CESAREAN SECTION  1996   LUMBAR LAMINECTOMY  08/31/2011   Procedure: MICRODISCECTOMY LUMBAR LAMINECTOMY;  Surgeon: Jacki Conesonald A Gioffre, MD;  Location: [facility];  Service: Orthopedics;  Laterality: N/A;  lumbar five sacral one central microdisectomy   MYOMECTOMY  13   NECK SURGERY     POSTERIOR CERVICAL FUSION/FORAMINOTOMY N/A 02/02/2013   Procedure: POSTERIOR C5-6 SPINAL FUSION;  Surgeon: Venita Lickahari Brooks, MD;  Location: [facility];  Service: Orthopedics;  Laterality: N/A;   POSTERIOR FUSION CERVICAL SPINE  02/02/2013   C 5 C6     Dr Shon BatonBrooks   WISDOM TOOTH EXTRACTION     Patient Active Problem List   Diagnosis Date Noted   Greater trochanteric bursitis of right hip 02/12/2015   Metabolic syndrome 08/27/2014   Other constipation 05/24/2014   Greater trochanteric bursitis of left hip 09/08/2013   UTI (urinary tract infection) 09/03/2011   Hypokalemia 09/03/2011   Cauda  equina syndrome 09/03/2011   Spinal stenosis of lumbar region 08/31/2011   Lumbar herniated disc 08/31/2011   Abdominal pain, acute, generalized 08/28/2011   Nausea and vomiting 08/28/2011   Dehydration 08/28/2011   S/P hysterectomy 4/13 08/28/2011   Menometrorrhagia 05/21/2011   DVT of right leg (deep venous thrombosis) in 01/2011 05/21/2011   Fibroid uterus 03/20/2011   LBP (low back pain) 05/29/2010   Avitaminosis D 04/23/2010   Benign essential HTN 04/21/2010   Essential (primary) hypertension 04/21/2010   Allergic rhinitis 11/01/2009   Absolute anemia 11/01/2009   Headache, migraine 11/01/2009   Adiposity 11/01/2009    REFERRING DIAG: Lumbar radiculitis; Lumbar post-laminectomy syndrome  THERAPY DIAG:  Other low back pain  Pain in left leg  Muscle weakness (generalized)  Rationale for Evaluation and Treatment Rehabilitation  PERTINENT HISTORY: L5-S1 decompression microdiscectomy and laminectomy 2013; Posterior C5-C6 fusion 2014  PRECAUTIONS: None   SUBJECTIVE:  SUBJECTIVE STATEMENT:  Patient reports she was worn out after last time but did pretty good. I have been having more muscle spasms. This morning I have had more N/T on upper lateral legs.    PAIN:  Are you having pain? Yes:  NPRS scale: 8/10 (10/10 at worse) Pain location: Low back, left hip and back of leg Pain description: Aching and stabbing, shooting, numbness, tingling Aggravating factors: Sitting or walking and bending Relieving factors: Medication, a little exercise, heating pad   OBJECTIVE: (objective measures completed at initial evaluation unless otherwise dated) PATIENT SURVEYS:  Modified Oswestry 60% disability           SENSATION: Patient report sensation deficit of left thight   MUSCLE LENGTH: Flexibility  deficits noted bilateral hamstring (left worse than right) and hip musculature   POSTURE:             Increased lumbar lordosis   PALPATION: Global tenderness to palpation of lumbar and left gluteal region   LUMBAR ROM:    AROM eval 06/26/22  Flexion 25% - fingertips to knees 50% fingertips proximal shin  Extension 50%   Right lateral flexion 75%   Left lateral flexion 50%   Right rotation 50%   Left rotation 25%    (Blank rows = not tested)   Patient reports low back pain and left hip/posterior thigh pain with all lumbar movement   LOWER EXTREMITY MMT:     MMT Right eval Left eval  Hip flexion 4 4-  Hip extension 3 3  Hip abduction 3 3  Hip adduction      Hip internal rotation      Hip external rotation      Knee flexion 5 4+  Knee extension 5 5  Ankle dorsiflexion 5 4+  Ankle plantarflexion      Ankle inversion      Ankle eversion 5 4+   (Blank rows = not tested)   LUMBAR SPECIAL TESTS:  Slump positive on left   FUNCTIONAL TESTS:  Unable to perform DLLT 06/29/22: 5 x STS : 26 sec without UE - standard Mat    GAIT: Assistive device utilized: Single point cane Level of assistance: Modified independence Comments: Slightly antalgic on the left     TODAY'S TREATMENT:   [facility] Adult PT Treatment:                                                DATE: 06/29/22 Therapeutic Exercise: Nustep L5 x 7 minutes Seated Lumbar flexion with stool  STS 5 x 2 without UE Supine clam blue band bilat and unilateral with ab brace  Supine Physioball ab set 5 sec 10 x 2  SLR with ab draw in 10 x 1 each PPT with bridge x 12  Adductor squeeze x 10 with ab draw in Bridge with ball squeeze  LTR feet narrow , feet wide  Supine piriformis stretch Seated hamstring stretch  Therapeutic Activity: 5 x STS 26 sec    [facility] Adult PT Treatment:                                                DATE: 06/26/22 Therapeutic Exercise: Nustep L5 x 5 min with UE/LE while  taking subjective Seated  hamstring stretch 2 x 20 sec  Seated lumbar flexion physioball roll-out 5 x 10 sec Seated physioball press abdominal set 10 x 5 sec LTR x 10 Wind shield wipers x 10 Piriformis stretch 2 x 20 sec Bridge partial range 2 x 10 SLR with abdominal engagement x 10 each Hooklying clamshell with blue x 10 Standing alternating march with focus on core and gluteal activation of stance leg and right UE support 2 x 10   [facility] Adult PT Treatment:                                                DATE: 06/23/22 Therapeutic Exercise: Nustep L4 UE/LE x 5 minutes  Supine PPT 10 x 2 with cues for breathing PPT to Bridge x 6 LTR x 10 Piriformis stretch - with towel assist 2 x 30 sec  SKTC with towel 2 x 30 sec  Green band Clam  10 x 2, cues for abdominal draw in Piriformis stretch x 1 each - no towel needed Seated lumbar flexion with ball 10 sec x 3   [facility] Adult PT Treatment:                                                DATE: 06/05/2022 Therapeutic Exercise: Supine PPT (cued for glute set) 5 x 5 sec hold LTR x 5 Supine Piriformis stretch using towel around thigh 2 x 30 sec each   PATIENT EDUCATION:  Education details: HEP Person educated: Patient Education method: Programmer, multimedia, Demonstration, Tactile cues, Verbal cues Education comprehension: verbalized understanding, returned demonstration, verbal cues required, tactile cues required, and needs further education   HOME EXERCISE PROGRAM: Access Code: [email or web address] Access Code: [email or web address]  Exercises - Supine Posterior Pelvic Tilt  - 2 x daily - 10 reps - a few seconds hold - Supine Lower Trunk Rotation  - 2 x daily - 10 reps - a few seconds hold - Supine Hip Internal and External Rotation  - 2 x daily - 10 reps - a few seconds hold - Supine Piriformis Stretch with Foot on Ground  - 2 x daily - 3 reps - 30 second hold - Hooklying Clamshell with Resistance  - 1 x daily - 2 sets - 10 reps - 5 hold - Supine Bridge  - 1 x daily - 2 sets - 10 reps - Straight  Leg Raise  - 1 x daily - 2 sets - 10 reps     ASSESSMENT: CLINICAL IMPRESSION: Patient tolerated therapy well with no adverse effects. Therapy focused on continued mobility and stretching for the lumbar and hip region, and progressing her core and hip strengthening with good tolerance. Pt reports increased N/T in thighs today. She reports 8/10 pain with pain frequently reaching 10/10 between medication doses. She is compliant and independenet with Initial HEP, STG# 1 met. Pain level down to 8/10 from 9/10 with meds. Progressing toward STG# 2. Captured 5 x STS at  26 sec. Patient would benefit from continued skilled PT to progress her mobility and strength in order to reduce pain and maximize functional ability.     OBJECTIVE IMPAIRMENTS: Abnormal gait, decreased activity tolerance, difficulty walking, decreased ROM, decreased strength, impaired flexibility, postural dysfunction,  and pain.    ACTIVITY LIMITATIONS: carrying, lifting, bending, sitting, standing, squatting, sleeping, transfers, bed mobility, bathing, hygiene/grooming, and locomotion level   PARTICIPATION LIMITATIONS: meal prep, cleaning, laundry, driving, shopping, community activity, and occupation   PERSONAL FACTORS: Fitness, Past/current experiences, Time since onset of injury/illness/exacerbation, and 1 comorbidity: see PMH above  are also affecting patient's functional outcome.      GOALS: Goals reviewed with patient? Yes   SHORT TERM GOALS: Target date: 07/03/2022   Patient will be I with initial HEP in order to progress with therapy. Baseline: HEP provided at eval 06/29/22: compliance and independent  Goal status: MET   2.  Patient will report </= 7/10 pain in order to reduce functional limitations Baseline: 9/10 06/29/22: 8/10  Goal status: ONGOING   LONG TERM GOALS: Target date: 07/31/2022   Patient will be I with final HEP to maintain progress from PT. Baseline: HEP provided at eval Goal status: INITIAL   2.   Patient will report Modified Oswestry </= 40% in order to indicate improved functional ability and reduced pain Baseline: 60% Goal status: INITIAL   3.  Patient will demonstrate a 25% improvement in lumbar AROM in order to improve dressing and self care ability Baseline: see lumbar AROM limitations noted above Goal status: INITIAL   4.  Patient will exhibit improved hip strength >/= 4-/5 MMT in order to improve standing and walking tolerance Baseline: 3/5 MMT Goal status: INITIAL     PLAN: PT FREQUENCY: 1-2x/week   PT DURATION: 8 weeks   PLANNED INTERVENTIONS: Therapeutic exercises, Therapeutic activity, Neuromuscular re-education, Balance training, Gait training, Patient/Family education, Self Care, Joint mobilization, Aquatic Therapy, Dry Needling, Electrical stimulation, Cryotherapy, Moist heat, Manual therapy, and Re-evaluation.   PLAN FOR NEXT SESSION: Review HEP and progress PRN, manual/modalities for lumbar pain relief, progress lumbar and hip stretching and initiate core stabilization and hip strengthening as tolerated     Jannette Spanner, PTA 06/29/22 11:43 AM Phone: [phone] Fax: [phone]

## 2022-07-01 ENCOUNTER — Encounter: Payer: Self-pay | Admitting: Physical Therapy

## 2022-07-01 ENCOUNTER — Ambulatory Visit: Payer: Medicaid Other | Admitting: Physical Therapy

## 2022-07-01 DIAGNOSIS — M6281 Muscle weakness (generalized): Secondary | ICD-10-CM

## 2022-07-01 DIAGNOSIS — M5459 Other low back pain: Secondary | ICD-10-CM

## 2022-07-01 DIAGNOSIS — M79605 Pain in left leg: Secondary | ICD-10-CM

## 2022-07-01 NOTE — Therapy (Signed)
OUTPATIENT PHYSICAL THERAPY TREATMENT NOTE   Patient Name: Brooke MingsSonya Potteiger MRN: [id number] DOB:1970-06-16, 52 y.o., female Today's Date: 07/01/2022  PCP: Andi DevonShelton, Kimberly, MD REFERRING PROVIDER: Erick ColaceKirsteins, Andrew E, MD   END OF SESSION:   PT End of Session - 07/01/22 1104     Visit Number 5    Number of Visits 17    Date for PT Re-Evaluation 07/31/22    Authorization Type [facility] [employer]    Authorization - Number of Visits 27    PT Start Time 1102    PT Stop Time 1142    PT Time Calculation (min) 40 min              Past Medical History:  Diagnosis Date   Anemia    hx   Arthritis    Complication of anesthesia    slow to wake up,   very difficult to get iv had picc line last time   Difficult intravenous access    hands are usually best for blood draws and IV starts   DVT (deep venous thrombosis)    ? vs lupus   Fibroids    Headache(784.0)    Neck pain    Scoliosis    Past Surgical History:  Procedure Laterality Date   ABDOMINAL HYSTERECTOMY  06/29/11   Robotic Assisted Hysterectomy   CERVICAL SPINE SURGERY  10   CESAREAN SECTION  1996   LUMBAR LAMINECTOMY  08/31/2011   Procedure: MICRODISCECTOMY LUMBAR LAMINECTOMY;  Surgeon: Jacki Conesonald A Gioffre, MD;  Location: [facility];  Service: Orthopedics;  Laterality: N/A;  lumbar five sacral one central microdisectomy   MYOMECTOMY  13   NECK SURGERY     POSTERIOR CERVICAL FUSION/FORAMINOTOMY N/A 02/02/2013   Procedure: POSTERIOR C5-6 SPINAL FUSION;  Surgeon: Venita Lickahari Brooks, MD;  Location: [facility];  Service: Orthopedics;  Laterality: N/A;   POSTERIOR FUSION CERVICAL SPINE  02/02/2013   C 5 C6     Dr Shon BatonBrooks   WISDOM TOOTH EXTRACTION     Patient Active Problem List   Diagnosis Date Noted   Greater trochanteric bursitis of right hip 02/12/2015   Metabolic syndrome 08/27/2014   Other constipation 05/24/2014   Greater trochanteric bursitis of left hip 09/08/2013   UTI (urinary tract infection) 09/03/2011   Hypokalemia 09/03/2011    Cauda equina syndrome 09/03/2011   Spinal stenosis of lumbar region 08/31/2011   Lumbar herniated disc 08/31/2011   Abdominal pain, acute, generalized 08/28/2011   Nausea and vomiting 08/28/2011   Dehydration 08/28/2011   S/P hysterectomy 4/13 08/28/2011   Menometrorrhagia 05/21/2011   DVT of right leg (deep venous thrombosis) in 01/2011 05/21/2011   Fibroid uterus 03/20/2011   LBP (low back pain) 05/29/2010   Avitaminosis D 04/23/2010   Benign essential HTN 04/21/2010   Essential (primary) hypertension 04/21/2010   Allergic rhinitis 11/01/2009   Absolute anemia 11/01/2009   Headache, migraine 11/01/2009   Adiposity 11/01/2009    REFERRING DIAG: Lumbar radiculitis; Lumbar post-laminectomy syndrome  THERAPY DIAG:  Other low back pain  Pain in left leg  Muscle weakness (generalized)  Rationale for Evaluation and Treatment Rehabilitation  PERTINENT HISTORY: L5-S1 decompression microdiscectomy and laminectomy 2013; Posterior C5-C6 fusion 2014  PRECAUTIONS: None   SUBJECTIVE:  SUBJECTIVE STATEMENT: Patient reports more pain today with change in weather. 8/10 after medicine. No N/T in thighs today.   PAIN:  Are you having pain? Yes:  NPRS scale: 8/10 (10/10 at worse) Pain location: Low back, left hip and back of leg Pain description: Aching and stabbing, shooting, numbness, tingling Aggravating factors: Sitting or walking and bending Relieving factors: Medication, a little exercise, heating pad   OBJECTIVE: (objective measures completed at initial evaluation unless otherwise dated) PATIENT SURVEYS:  Modified Oswestry 60% disability           SENSATION: Patient report sensation deficit of left thight   MUSCLE LENGTH: Flexibility deficits noted bilateral hamstring (left worse than right) and  hip musculature   POSTURE:             Increased lumbar lordosis   PALPATION: Global tenderness to palpation of lumbar and left gluteal region   LUMBAR ROM:    AROM eval 06/26/22  Flexion 25% - fingertips to knees 50% fingertips proximal shin  Extension 50%   Right lateral flexion 75%   Left lateral flexion 50%   Right rotation 50%   Left rotation 25%    (Blank rows = not tested)   Patient reports low back pain and left hip/posterior thigh pain with all lumbar movement   LOWER EXTREMITY MMT:     MMT Right eval Left eval  Hip flexion 4 4-  Hip extension 3 3  Hip abduction 3 3  Hip adduction      Hip internal rotation      Hip external rotation      Knee flexion 5 4+  Knee extension 5 5  Ankle dorsiflexion 5 4+  Ankle plantarflexion      Ankle inversion      Ankle eversion 5 4+   (Blank rows = not tested)   LUMBAR SPECIAL TESTS:  Slump positive on left   FUNCTIONAL TESTS:  Unable to perform DLLT 06/29/22: 5 x STS : 26 sec without UE - standard Mat    GAIT: Assistive device utilized: Single point cane Level of assistance: Modified independence Comments: Slightly antalgic on the left     TODAY'S TREATMENT:   [facility] Adult PT Treatment:                                                DATE: 07/01/22 Therapeutic Exercise: Nustep L5 x 7 minutes  Seated lumbar flexion with ball 5 sec x 10 STS x 10 with cues for eccentric control  Supine clam blue band bilat and unilateral with ab brace  Supine Physioball ab set 5 sec 10 x 2  PPT to bridge x 15  SLR with ab draw in 10 x 1 each Piriformis stretch LTR feet narrow, feet wide   [facility] Adult PT Treatment:                                                DATE: 06/29/22 Therapeutic Exercise: Nustep L5 x 7 minutes Seated Lumbar flexion with stool  STS 5 x 2 without UE Supine clam blue band bilat and unilateral with ab brace  Supine Physioball ab set 5 sec 10 x 2  SLR with ab draw in 10 x  1 each PPT with bridge x 12   Adductor squeeze x 10 with ab draw in Bridge with ball squeeze  LTR feet narrow , feet wide  Supine piriformis stretch Seated hamstring stretch  Therapeutic Activity: 5 x STS 26 sec    [facility] Adult PT Treatment:                                                DATE: 06/26/22 Therapeutic Exercise: Nustep L5 x 5 min with UE/LE while taking subjective Seated hamstring stretch 2 x 20 sec  Seated lumbar flexion physioball roll-out 5 x 10 sec Seated physioball press abdominal set 10 x 5 sec LTR x 10 Wind shield wipers x 10 Piriformis stretch 2 x 20 sec Bridge partial range 2 x 10 SLR with abdominal engagement x 10 each Hooklying clamshell with blue x 10 Standing alternating march with focus on core and gluteal activation of stance leg and right UE support 2 x 10   [facility] Adult PT Treatment:                                                DATE: 06/23/22 Therapeutic Exercise: Nustep L4 UE/LE x 5 minutes  Supine PPT 10 x 2 with cues for breathing PPT to Bridge x 6 LTR x 10 Piriformis stretch - with towel assist 2 x 30 sec  SKTC with towel 2 x 30 sec  Green band Clam  10 x 2, cues for abdominal draw in Piriformis stretch x 1 each - no towel needed Seated lumbar flexion with ball 10 sec x 3   [facility] Adult PT Treatment:                                                DATE: 06/05/2022 Therapeutic Exercise: Supine PPT (cued for glute set) 5 x 5 sec hold LTR x 5 Supine Piriformis stretch using towel around thigh 2 x 30 sec each   PATIENT EDUCATION:  Education details: HEP Person educated: Patient Education method: Programmer, multimedia, Demonstration, Tactile cues, Verbal cues Education comprehension: verbalized understanding, returned demonstration, verbal cues required, tactile cues required, and needs further education   HOME EXERCISE PROGRAM: Access Code: [email or web address] Access Code: [email or web address]  Exercises - Supine Posterior Pelvic Tilt  - 2 x daily - 10 reps - a few seconds hold - Supine Lower Trunk  Rotation  - 2 x daily - 10 reps - a few seconds hold - Supine Hip Internal and External Rotation  - 2 x daily - 10 reps - a few seconds hold - Supine Piriformis Stretch with Foot on Ground  - 2 x daily - 3 reps - 30 second hold - Hooklying Clamshell with Resistance  - 1 x daily - 2 sets - 10 reps - 5 hold - Supine Bridge  - 1 x daily - 2 sets - 10 reps - Straight Leg Raise  - 1 x daily - 2 sets - 10 reps     ASSESSMENT: CLINICAL IMPRESSION: Patient tolerated therapy well with no adverse effects. Therapy focused on continued mobility and stretching for the  lumbar and hip region, and progressing her core and hip strengthening with good tolerance. Pt reports no N/T in thighs today. She reports 8/10 pain and 10/10 prior to meds this morning. Pain is elevated with the change in weather. Despite increased pain today, she is able to complete all prescribed therex without c/o pain increase. At end of session she reports no change in pain.  Patient would benefit from continued skilled PT to progress her mobility and strength in order to reduce pain and maximize functional ability.     OBJECTIVE IMPAIRMENTS: Abnormal gait, decreased activity tolerance, difficulty walking, decreased ROM, decreased strength, impaired flexibility, postural dysfunction, and pain.    ACTIVITY LIMITATIONS: carrying, lifting, bending, sitting, standing, squatting, sleeping, transfers, bed mobility, bathing, hygiene/grooming, and locomotion level   PARTICIPATION LIMITATIONS: meal prep, cleaning, laundry, driving, shopping, community activity, and occupation   PERSONAL FACTORS: Fitness, Past/current experiences, Time since onset of injury/illness/exacerbation, and 1 comorbidity: see PMH above  are also affecting patient's functional outcome.      GOALS: Goals reviewed with patient? Yes   SHORT TERM GOALS: Target date: 07/03/2022   Patient will be I with initial HEP in order to progress with therapy. Baseline: HEP provided  at eval 06/29/22: compliance and independent  Goal status: MET   2.  Patient will report </= 7/10 pain in order to reduce functional limitations Baseline: 9/10 06/29/22: 8/10  Goal status: ONGOING   LONG TERM GOALS: Target date: 07/31/2022   Patient will be I with final HEP to maintain progress from PT. Baseline: HEP provided at eval Goal status: INITIAL   2.  Patient will report Modified Oswestry </= 40% in order to indicate improved functional ability and reduced pain Baseline: 60% Goal status: INITIAL   3.  Patient will demonstrate a 25% improvement in lumbar AROM in order to improve dressing and self care ability Baseline: see lumbar AROM limitations noted above Goal status: INITIAL   4.  Patient will exhibit improved hip strength >/= 4-/5 MMT in order to improve standing and walking tolerance Baseline: 3/5 MMT Goal status: INITIAL     PLAN: PT FREQUENCY: 1-2x/week   PT DURATION: 8 weeks   PLANNED INTERVENTIONS: Therapeutic exercises, Therapeutic activity, Neuromuscular re-education, Balance training, Gait training, Patient/Family education, Self Care, Joint mobilization, Aquatic Therapy, Dry Needling, Electrical stimulation, Cryotherapy, Moist heat, Manual therapy, and Re-evaluation.   PLAN FOR NEXT SESSION: Review HEP and progress PRN, manual/modalities for lumbar pain relief, progress lumbar and hip stretching and initiate core stabilization and hip strengthening as tolerated     Jannette Spanner, PTA 07/01/22 11:43 AM Phone: [phone] Fax: [phone]

## 2022-07-03 ENCOUNTER — Encounter: Payer: Medicaid Other | Admitting: Physical Medicine & Rehabilitation

## 2022-07-06 ENCOUNTER — Ambulatory Visit: Payer: Medicaid Other | Admitting: Physical Therapy

## 2022-07-08 ENCOUNTER — Encounter: Payer: Self-pay | Admitting: Physical Therapy

## 2022-07-08 ENCOUNTER — Ambulatory Visit: Payer: Medicaid Other | Admitting: Physical Therapy

## 2022-07-08 DIAGNOSIS — M5459 Other low back pain: Secondary | ICD-10-CM | POA: Diagnosis not present

## 2022-07-08 DIAGNOSIS — M79605 Pain in left leg: Secondary | ICD-10-CM

## 2022-07-08 DIAGNOSIS — M6281 Muscle weakness (generalized): Secondary | ICD-10-CM

## 2022-07-08 NOTE — Therapy (Signed)
OUTPATIENT PHYSICAL THERAPY TREATMENT NOTE   Patient Name: [name] MRN: [id number] DOB:10/05/1970, 52 y.o., female Today's Date: 07/08/2022  PCP: Andi Devon, MD REFERRING PROVIDER: Erick Colace, MD   END OF SESSION:   PT End of Session - 07/08/22 1106     Visit Number 6    Number of Visits 17    Date for PT Re-Evaluation 07/31/22    Authorization Type [employer]    PT Start Time 1102    PT Stop Time 1145    PT Time Calculation (min) 43 min              Past Medical History:  Diagnosis Date   Anemia    hx   Arthritis    Complication of anesthesia    slow to wake up,   very difficult to get iv had picc line last time   Difficult intravenous access    hands are usually best for blood draws and IV starts   DVT (deep venous thrombosis)    ? vs lupus   Fibroids    Headache(784.0)    Neck pain    Scoliosis    Past Surgical History:  Procedure Laterality Date   ABDOMINAL HYSTERECTOMY  06/29/11   Robotic Assisted Hysterectomy   CERVICAL SPINE SURGERY  10   CESAREAN SECTION  1996   LUMBAR LAMINECTOMY  08/31/2011   Procedure: MICRODISCECTOMY LUMBAR LAMINECTOMY;  Surgeon: Jacki Cones, MD;  Location: [facility];  Service: Orthopedics;  Laterality: N/A;  lumbar five sacral one central microdisectomy   MYOMECTOMY  13   NECK SURGERY     POSTERIOR CERVICAL FUSION/FORAMINOTOMY N/A 02/02/2013   Procedure: POSTERIOR C5-6 SPINAL FUSION;  Surgeon: Venita Lick, MD;  Location: [facility];  Service: Orthopedics;  Laterality: N/A;   POSTERIOR FUSION CERVICAL SPINE  02/02/2013   C 5 C6     Dr Shon Baton   WISDOM TOOTH EXTRACTION     Patient Active Problem List   Diagnosis Date Noted   Greater trochanteric bursitis of right hip 02/12/2015   Metabolic syndrome 08/27/2014   Other constipation 05/24/2014   Greater trochanteric bursitis of left hip 09/08/2013   UTI (urinary tract infection) 09/03/2011   Hypokalemia 09/03/2011   Cauda equina syndrome 09/03/2011   Spinal  stenosis of lumbar region 08/31/2011   Lumbar herniated disc 08/31/2011   Abdominal pain, acute, generalized 08/28/2011   Nausea and vomiting 08/28/2011   Dehydration 08/28/2011   S/P hysterectomy 4/13 08/28/2011   Menometrorrhagia 05/21/2011   DVT of right leg (deep venous thrombosis) in 01/2011 05/21/2011   Fibroid uterus 03/20/2011   LBP (low back pain) 05/29/2010   Avitaminosis D 04/23/2010   Benign essential HTN 04/21/2010   Essential (primary) hypertension 04/21/2010   Allergic rhinitis 11/01/2009   Absolute anemia 11/01/2009   Headache, migraine 11/01/2009   Adiposity 11/01/2009    REFERRING DIAG: Lumbar radiculitis; Lumbar post-laminectomy syndrome  THERAPY DIAG:  Other low back pain  Pain in left leg  Muscle weakness (generalized)  Rationale for Evaluation and Treatment Rehabilitation  PERTINENT HISTORY: L5-S1 decompression microdiscectomy and laminectomy 2013; Posterior C5-C6 fusion 2014  PRECAUTIONS: None   SUBJECTIVE:  SUBJECTIVE STATEMENT:Patient reports pain is 9/10 and related to stress of finding her brother passed away on 08-21-22 morning. She took her pain medicine this morning.   PAIN:  Are you having pain? Yes:  NPRS scale: 9/10 (10/10 at worse) Pain location: Low back, left hip and back of leg Pain description: Aching and stabbing, shooting, numbness, tingling Aggravating factors: Sitting or walking and bending Relieving factors: Medication, a little exercise, heating pad   OBJECTIVE: (objective measures completed at initial evaluation unless otherwise dated) PATIENT SURVEYS:  Modified Oswestry 60% disability  07/08/22: Modified Oswestry: 27/50, 54%          SENSATION: Patient report sensation deficit of left thight   MUSCLE LENGTH: Flexibility deficits noted  bilateral hamstring (left worse than right) and hip musculature   POSTURE:             Increased lumbar lordosis   PALPATION: Global tenderness to palpation of lumbar and left gluteal region   LUMBAR ROM:    AROM eval 06/26/22  Flexion 25% - fingertips to knees 50% fingertips proximal shin  Extension 50%   Right lateral flexion 75%   Left lateral flexion 50%   Right rotation 50%   Left rotation 25%    (Blank rows = not tested)   Patient reports low back pain and left hip/posterior thigh pain with all lumbar movement   LOWER EXTREMITY MMT:     MMT Right eval Left eval Right  Left  Hip flexion 4 4-    Hip extension 3 3    Hip abduction 3 3    Hip adduction        Hip internal rotation        Hip external rotation        Knee flexion 5 4+    Knee extension 5 5    Ankle dorsiflexion 5 4+    Ankle plantarflexion        Ankle inversion        Ankle eversion 5 4+     (Blank rows = not tested)   LUMBAR SPECIAL TESTS:  Slump positive on left   FUNCTIONAL TESTS:  Unable to perform DLLT 06/29/22: 5 x STS : 26 sec without UE - standard Mat    GAIT: Assistive device utilized: Single point cane Level of assistance: Modified independence Comments: Slightly antalgic on the left     TODAY'S TREATMENT:   [facility] Adult PT Treatment:                                                DATE: 07/08/22 Therapeutic Exercise: Nustep L5 UE/LE x 6 minutes Seated lumbar flexion with ball STS x 10 PPT with March PPT to Bridge PPT with clam  PPT with SLR x 10 each  SKTC LTR feet narrow , feet wide  Supine piriformis stretch Seated hamstring stretch    [facility] Adult PT Treatment:                                                DATE: 07/01/22 Therapeutic Exercise: Nustep L5 x 7 minutes  Seated lumbar flexion with ball 5 sec x 10 STS x 10 with cues for eccentric control  Supine clam blue  band bilat and unilateral with ab brace  Supine Physioball ab set 5 sec 10 x 2  PPT to bridge x 15  SLR  with ab draw in 10 x 1 each Piriformis stretch LTR feet narrow, feet wide   [facility] Adult PT Treatment:                                                DATE: 06/29/22 Therapeutic Exercise: Nustep L5 x 7 minutes Seated Lumbar flexion with stool  STS 5 x 2 without UE Supine clam blue band bilat and unilateral with ab brace  Supine Physioball ab set 5 sec 10 x 2  SLR with ab draw in 10 x 1 each PPT with bridge x 12  Adductor squeeze x 10 with ab draw in Bridge with ball squeeze  LTR feet narrow , feet wide  Supine piriformis stretch Seated hamstring stretch  Therapeutic Activity: 5 x STS 26 sec    [facility] Adult PT Treatment:                                                DATE: 06/26/22 Therapeutic Exercise: Nustep L5 x 5 min with UE/LE while taking subjective Seated hamstring stretch 2 x 20 sec  Seated lumbar flexion physioball roll-out 5 x 10 sec Seated physioball press abdominal set 10 x 5 sec LTR x 10 Wind shield wipers x 10 Piriformis stretch 2 x 20 sec Bridge partial range 2 x 10 SLR with abdominal engagement x 10 each Hooklying clamshell with blue x 10 Standing alternating march with focus on core and gluteal activation of stance leg and right UE support 2 x 10      PATIENT EDUCATION:  Education details: HEP Person educated: Patient Education method: Programmer, multimedia, Demonstration, Tactile cues, Verbal cues Education comprehension: verbalized understanding, returned demonstration, verbal cues required, tactile cues required, and needs further education   HOME EXERCISE PROGRAM: Access Code: [email or web address] Access Code: [email or web address]  Exercises - Supine Posterior Pelvic Tilt  - 2 x daily - 10 reps - a few seconds hold - Supine Lower Trunk Rotation  - 2 x daily - 10 reps - a few seconds hold - Supine Hip Internal and External Rotation  - 2 x daily - 10 reps - a few seconds hold - Supine Piriformis Stretch with Foot on Ground  - 2 x daily - 3 reps - 30 second hold - Hooklying Clamshell  with Resistance  - 1 x daily - 2 sets - 10 reps - 5 hold - Supine Bridge  - 1 x daily - 2 sets - 10 reps - Straight Leg Raise  - 1 x daily - 2 sets - 10 reps     ASSESSMENT: CLINICAL IMPRESSION: Patient tolerated therapy well with no adverse effects. Therapy focused on continued mobility and stretching for the lumbar and hip region, and progressing her core and hip strengthening with good tolerance. Pt reports min N/T in thighs today, more intermittent over all. She reports 9/10 pain and 10/10 prior to meds this morning. Pain is elevated with the stress of her brother's death 5 days ago. Despite increased pain today, she is able to complete all prescribed therex without c/o pain increase. At end of  session she reports no change in pain. ODI improved from 60% to 54% disability.  Patient would benefit from continued skilled PT to progress her mobility and strength in order to reduce pain and maximize functional ability.     OBJECTIVE IMPAIRMENTS: Abnormal gait, decreased activity tolerance, difficulty walking, decreased ROM, decreased strength, impaired flexibility, postural dysfunction, and pain.    ACTIVITY LIMITATIONS: carrying, lifting, bending, sitting, standing, squatting, sleeping, transfers, bed mobility, bathing, hygiene/grooming, and locomotion level   PARTICIPATION LIMITATIONS: meal prep, cleaning, laundry, driving, shopping, community activity, and occupation   PERSONAL FACTORS: Fitness, Past/current experiences, Time since onset of injury/illness/exacerbation, and 1 comorbidity: see PMH above  are also affecting patient's functional outcome.      GOALS: Goals reviewed with patient? Yes   SHORT TERM GOALS: Target date: 07/03/2022   Patient will be I with initial HEP in order to progress with therapy. Baseline: HEP provided at eval 06/29/22: compliance and independent  Goal status: MET   2.  Patient will report </= 7/10 pain in order to reduce functional limitations Baseline:  9/10 06/29/22: 8/10  07/08/22: 9/10 Goal status: ONGOING   LONG TERM GOALS: Target date: 07/31/2022   Patient will be I with final HEP to maintain progress from PT. Baseline: HEP provided at eval Goal status: INITIAL   2.  Patient will report Modified Oswestry </= 40% in order to indicate improved functional ability and reduced pain Baseline: 60% 07/08/22: 54% Goal status: ONGOING   3.  Patient will demonstrate a 25% improvement in lumbar AROM in order to improve dressing and self care ability Baseline: see lumbar AROM limitations noted above Goal status: INITIAL   4.  Patient will exhibit improved hip strength >/= 4-/5 MMT in order to improve standing and walking tolerance Baseline: 3/5 MMT Goal status: INITIAL     PLAN: PT FREQUENCY: 1-2x/week   PT DURATION: 8 weeks   PLANNED INTERVENTIONS: Therapeutic exercises, Therapeutic activity, Neuromuscular re-education, Balance training, Gait training, Patient/Family education, Self Care, Joint mobilization, Aquatic Therapy, Dry Needling, Electrical stimulation, Cryotherapy, Moist heat, Manual therapy, and Re-evaluation.   PLAN FOR NEXT SESSION: Review HEP and progress PRN, manual/modalities for lumbar pain relief, progress lumbar and hip stretching and progress core stabilization and hip strengthening as tolerated     Jannette Spanner, PTA 07/08/22 11:40 AM Phone: [phone] Fax: [phone]

## 2022-07-10 NOTE — Therapy (Signed)
OUTPATIENT PHYSICAL THERAPY TREATMENT NOTE   Patient Name: [name] MRN: [id number] DOB:Jun 18, 1970, 52 y.o., female 89 Date: 07/13/2022  PCP: Andi Devon, MD REFERRING PROVIDER: Erick Colace, MD   END OF SESSION:   PT End of Session - 07/13/22 1107     Visit Number 7    Number of Visits 17    Date for PT Re-Evaluation 07/31/22    Authorization Type [facility]    Authorization - Number of Visits 27    PT Start Time 1100    PT Stop Time 1142    PT Time Calculation (min) 42 min    Activity Tolerance Patient tolerated treatment well    Behavior During Therapy WFL for tasks assessed/performed               Past Medical History:  Diagnosis Date   Anemia    hx   Arthritis    Complication of anesthesia    slow to wake up,   very difficult to get iv had picc line last time   Difficult intravenous access    hands are usually best for blood draws and IV starts   DVT (deep venous thrombosis)    ? vs lupus   Fibroids    Headache(784.0)    Neck pain    Scoliosis    Past Surgical History:  Procedure Laterality Date   ABDOMINAL HYSTERECTOMY  06/29/11   Robotic Assisted Hysterectomy   CERVICAL SPINE SURGERY  10   CESAREAN SECTION  1996   LUMBAR LAMINECTOMY  08/31/2011   Procedure: MICRODISCECTOMY LUMBAR LAMINECTOMY;  Surgeon: Jacki Cones, MD;  Location: [facility];  Service: Orthopedics;  Laterality: N/A;  lumbar five sacral one central microdisectomy   MYOMECTOMY  13   NECK SURGERY     POSTERIOR CERVICAL FUSION/FORAMINOTOMY N/A 02/02/2013   Procedure: POSTERIOR C5-6 SPINAL FUSION;  Surgeon: Venita Lick, MD;  Location: [facility];  Service: Orthopedics;  Laterality: N/A;   POSTERIOR FUSION CERVICAL SPINE  02/02/2013   C 5 C6     Dr Shon Baton   WISDOM TOOTH EXTRACTION     Patient Active Problem List   Diagnosis Date Noted   Greater trochanteric bursitis of right hip 02/12/2015   Metabolic syndrome 08/27/2014   Other constipation 05/24/2014   Greater  trochanteric bursitis of left hip 09/08/2013   UTI (urinary tract infection) 09/03/2011   Hypokalemia 09/03/2011   Cauda equina syndrome 09/03/2011   Spinal stenosis of lumbar region 08/31/2011   Lumbar herniated disc 08/31/2011   Abdominal pain, acute, generalized 08/28/2011   Nausea and vomiting 08/28/2011   Dehydration 08/28/2011   S/P hysterectomy 4/13 08/28/2011   Menometrorrhagia 05/21/2011   DVT of right leg (deep venous thrombosis) in 01/2011 05/21/2011   Fibroid uterus 03/20/2011   LBP (low back pain) 05/29/2010   Avitaminosis D 04/23/2010   Benign essential HTN 04/21/2010   Essential (primary) hypertension 04/21/2010   Allergic rhinitis 11/01/2009   Absolute anemia 11/01/2009   Headache, migraine 11/01/2009   Adiposity 11/01/2009    REFERRING DIAG: Lumbar radiculitis; Lumbar post-laminectomy syndrome  THERAPY DIAG:  Other low back pain  Pain in left leg  Muscle weakness (generalized)  Rationale for Evaluation and Treatment Rehabilitation  PERTINENT HISTORY: L5-S1 decompression microdiscectomy and laminectomy 2013; Posterior C5-C6 fusion 2014  PRECAUTIONS: None   SUBJECTIVE:  SUBJECTIVE STATEMENT: Patient reports is about a 9/10 and that is on medication. States she is still having a tough time due to losing he brother.   PAIN:  Are you having pain? Yes:  NPRS scale: 9/10 (10/10 at worse) Pain location: Low back, left hip and back of leg Pain description: Aching and stabbing, shooting, numbness, tingling Aggravating factors: Sitting or walking and bending Relieving factors: Medication, a little exercise, heating pad   OBJECTIVE: (objective measures completed at initial evaluation unless otherwise dated) PATIENT SURVEYS:  Modified Oswestry 60% disability  07/08/22: 27/50, 54%           SENSATION: Patient report sensation deficit of left thight   MUSCLE LENGTH: Flexibility deficits noted bilateral hamstring (left worse than right) and hip musculature   POSTURE:             Increased lumbar lordosis   PALPATION: Global tenderness to palpation of lumbar and left gluteal region   LUMBAR ROM:    AROM eval 06/26/22  Flexion 25% - fingertips to knees 50% fingertips proximal shin  Extension 50%   Right lateral flexion 75%   Left lateral flexion 50%   Right rotation 50%   Left rotation 25%    (Blank rows = not tested)   Patient reports low back pain and left hip/posterior thigh pain with all lumbar movement   LOWER EXTREMITY MMT:     MMT Right eval Left eval Rt / Lt 07/13/2022  Hip flexion 4 4- 4 / 4-  Hip extension 3 3 3  / 3  Hip abduction 3 3 3  / 3  Hip adduction       Hip internal rotation       Hip external rotation       Knee flexion 5 4+   Knee extension 5 5   Ankle dorsiflexion 5 4+   Ankle plantarflexion       Ankle inversion       Ankle eversion 5 4+    (Blank rows = not tested)   LUMBAR SPECIAL TESTS:  Slump positive on left   FUNCTIONAL TESTS:  Unable to perform DLLT  06/29/22: 5 x STS : 26 sec without UE - standard Mat    GAIT: Assistive device utilized: Single point cane Level of assistance: Modified independence Comments: Slightly antalgic on the left     TODAY'S TREATMENT:   [facility] Adult PT Treatment:                                                DATE: 07/13/22 Therapeutic Exercise: Nustep L5 x 5 min with UE/LE while taking subjective Seated hamstring stretch 2 x 30 sec  Seated lumbar flexion physioball roll-out 5 x 10 sec Seated physioball press abdominal set fwd and lateral 10 x 5 sec each LTR x 10 Wind shield wipers x 10 Bridge partial range 2 x 10 SLR with abdominal engagement 2 x 10 each Hooklying clamshell with blue 2 x 10 Sit to stand 2 x 10   [facility] Adult PT Treatment:                                                 DATE: 07/08/22 Therapeutic Exercise: Nustep L5 UE/LE  x 6 minutes Seated lumbar flexion with ball STS x 10 PPT with March PPT to Bridge PPT with clam  PPT with SLR x 10 each  SKTC LTR feet narrow , feet wide  Supine piriformis stretch Seated hamstring stretch  [facility] Adult PT Treatment:                                                DATE: 07/01/22 Therapeutic Exercise: Nustep L5 x 7 minutes  Seated lumbar flexion with ball 5 sec x 10 STS x 10 with cues for eccentric control  Supine clam blue band bilat and unilateral with ab brace  Supine Physioball ab set 5 sec 10 x 2  PPT to bridge x 15  SLR with ab draw in 10 x 1 each Piriformis stretch LTR feet narrow, feet wide  [facility] Adult PT Treatment:                                                DATE: 06/29/22 Therapeutic Exercise: Nustep L5 x 7 minutes Seated Lumbar flexion with stool  STS 5 x 2 without UE Supine clam blue band bilat and unilateral with ab brace  Supine Physioball ab set 5 sec 10 x 2  SLR with ab draw in 10 x 1 each PPT with bridge x 12  Adductor squeeze x 10 with ab draw in Bridge with ball squeeze  LTR feet narrow , feet wide  Supine piriformis stretch Seated hamstring stretch Therapeutic Activity: 5 x STS 26 sec   PATIENT EDUCATION:  Education details: HEP Person educated: Patient Education method: Programmer, multimedia, Facilities manager, Actor cues, Verbal cues Education comprehension: verbalized understanding, returned demonstration, verbal cues required, tactile cues required, and needs further education   HOME EXERCISE PROGRAM: Access Code: [email or web address]     ASSESSMENT: CLINICAL IMPRESSION: Patient tolerated therapy well with no adverse effects. Therapy focused on continued gentle stretching and core and hip engagement. She does continue to report high levels of baseline pain and demonstrates gross strength deficit of core and hips. She is able to complete all prescribed exercises and updated her HEP to progress  strengthening for home. Patient would benefit from continued skilled PT to progress her mobility and strength in order to reduce pain and maximize functional ability.      OBJECTIVE IMPAIRMENTS: Abnormal gait, decreased activity tolerance, difficulty walking, decreased ROM, decreased strength, impaired flexibility, postural dysfunction, and pain.    ACTIVITY LIMITATIONS: carrying, lifting, bending, sitting, standing, squatting, sleeping, transfers, bed mobility, bathing, hygiene/grooming, and locomotion level   PARTICIPATION LIMITATIONS: meal prep, cleaning, laundry, driving, shopping, community activity, and occupation   PERSONAL FACTORS: Fitness, Past/current experiences, Time since onset of injury/illness/exacerbation, and 1 comorbidity: see PMH above  are also affecting patient's functional outcome.      GOALS: Goals reviewed with patient? Yes   SHORT TERM GOALS: Target date: 07/03/2022   Patient will be I with initial HEP in order to progress with therapy. Baseline: HEP provided at eval 06/29/22: compliance and independent  Goal status: MET   2.  Patient will report </= 7/10 pain in order to reduce functional limitations Baseline: 9/10 06/29/22: 8/10  07/08/22: 9/10 Goal status: ONGOING   LONG TERM GOALS: Target date: 07/31/2022  Patient will be I with final HEP to maintain progress from PT. Baseline: HEP provided at eval Goal status: INITIAL   2.  Patient will report Modified Oswestry </= 40% in order to indicate improved functional ability and reduced pain Baseline: 60% 07/08/22: 54% Goal status: ONGOING   3.  Patient will demonstrate a 25% improvement in lumbar AROM in order to improve dressing and self care ability Baseline: see lumbar AROM limitations noted above Goal status: INITIAL   4.  Patient will exhibit improved hip strength >/= 4-/5 MMT in order to improve standing and walking tolerance Baseline: 3/5 MMT Goal status: INITIAL     PLAN: PT FREQUENCY:  1-2x/week   PT DURATION: 8 weeks   PLANNED INTERVENTIONS: Therapeutic exercises, Therapeutic activity, Neuromuscular re-education, Balance training, Gait training, Patient/Family education, Self Care, Joint mobilization, Aquatic Therapy, Dry Needling, Electrical stimulation, Cryotherapy, Moist heat, Manual therapy, and Re-evaluation.   PLAN FOR NEXT SESSION: Review HEP and progress PRN, manual/modalities for lumbar pain relief, progress lumbar and hip stretching and progress core stabilization and hip strengthening as tolerated     Rosana Hoes, PT, DPT, LAT, ATC 07/13/22  11:45 AM Phone: [phone] Fax: [phone]

## 2022-07-13 ENCOUNTER — Other Ambulatory Visit: Payer: Self-pay

## 2022-07-13 ENCOUNTER — Encounter: Payer: Self-pay | Admitting: Physical Therapy

## 2022-07-13 ENCOUNTER — Telehealth: Payer: Self-pay | Admitting: Registered Nurse

## 2022-07-13 ENCOUNTER — Ambulatory Visit: Payer: Medicaid Other | Admitting: Physical Therapy

## 2022-07-13 DIAGNOSIS — M6281 Muscle weakness (generalized): Secondary | ICD-10-CM

## 2022-07-13 DIAGNOSIS — M79605 Pain in left leg: Secondary | ICD-10-CM

## 2022-07-13 DIAGNOSIS — M7061 Trochanteric bursitis, right hip: Secondary | ICD-10-CM

## 2022-07-13 DIAGNOSIS — M5459 Other low back pain: Secondary | ICD-10-CM

## 2022-07-13 MED ORDER — OXYCODONE-ACETAMINOPHEN 10-325 MG PO TABS: ORAL_TABLET | ORAL | 0 refills | Status: DC

## 2022-07-13 NOTE — Patient Instructions (Signed)
Access Code: [email or web address] URL: [email or web address] Date: 07/13/2022 Prepared by: Rosana Hoes  Exercises - Supine Posterior Pelvic Tilt  - 2 x daily - 10 reps - a few seconds hold - Supine Lower Trunk Rotation  - 2 x daily - 10 reps - a few seconds hold - Supine Hip Internal and External Rotation  - 2 x daily - 10 reps - a few seconds hold - Supine Piriformis Stretch with Foot on Ground  - 2 x daily - 3 reps - 30 second hold - Hooklying Clamshell with Resistance  - 1 x daily - 2 sets - 10 reps - 5 hold - Supine Bridge  - 1 x daily - 2 sets - 10 reps - Straight Leg Raise  - 1 x daily - 2 sets - 10 reps - Sit to Stand Without Arm Support  - 1 x daily - 2 sets - 10 reps

## 2022-07-13 NOTE — Telephone Encounter (Signed)
PMP was Reviewed.  Oxycodone e-scribed today.  Ms. Sircy is aware via My-Chart message  

## 2022-07-14 NOTE — Therapy (Addendum)
OUTPATIENT PHYSICAL THERAPY TREATMENT NOTE   Patient Name: [name] MRN: [id number] DOB:30-Jul-1970, 52 y.o., female Today's Date: 08/04/2022  PCP: Andi Devon, MD REFERRING PROVIDER: Erick Colace, MD   END OF SESSION:   PT End of Session - 08/04/22 0824     Visit Number 8    Number of Visits 17    Date for PT Re-Evaluation 09/15/22    Authorization Type [employer]    Authorization - Number of Visits 27    PT Start Time 1100    PT Stop Time 1140    PT Time Calculation (min) 40 min    Activity Tolerance Patient tolerated treatment well    Behavior During Therapy WFL for tasks assessed/performed                 Past Medical History:  Diagnosis Date   Anemia    hx   Arthritis    Complication of anesthesia    slow to wake up,   very difficult to get iv had picc line last time   Difficult intravenous access    hands are usually best for blood draws and IV starts   DVT (deep venous thrombosis) (HCC)    ? vs lupus   Fibroids    Headache(784.0)    Neck pain    Scoliosis    Past Surgical History:  Procedure Laterality Date   ABDOMINAL HYSTERECTOMY  06/29/11   Robotic Assisted Hysterectomy   CERVICAL SPINE SURGERY  10   CESAREAN SECTION  1996   LUMBAR LAMINECTOMY  08/31/2011   Procedure: MICRODISCECTOMY LUMBAR LAMINECTOMY;  Surgeon: Jacki Cones, MD;  Location: [facility];  Service: Orthopedics;  Laterality: N/A;  lumbar five sacral one central microdisectomy   MYOMECTOMY  13   NECK SURGERY     POSTERIOR CERVICAL FUSION/FORAMINOTOMY N/A 02/02/2013   Procedure: POSTERIOR C5-6 SPINAL FUSION;  Surgeon: Venita Lick, MD;  Location: [facility];  Service: Orthopedics;  Laterality: N/A;   POSTERIOR FUSION CERVICAL SPINE  02/02/2013   C 5 C6     Dr Shon Baton   WISDOM TOOTH EXTRACTION     Patient Active Problem List   Diagnosis Date Noted   Greater trochanteric bursitis of right hip 02/12/2015   Metabolic syndrome 08/27/2014   Other constipation 05/24/2014    Greater trochanteric bursitis of left hip 09/08/2013   UTI (urinary tract infection) 09/03/2011   Hypokalemia 09/03/2011   Cauda equina syndrome (HCC) 09/03/2011   Spinal stenosis of lumbar region 08/31/2011   Lumbar herniated disc 08/31/2011   Abdominal pain, acute, generalized 08/28/2011   Nausea and vomiting 08/28/2011   Dehydration 08/28/2011   S/P hysterectomy 4/13 08/28/2011   Menometrorrhagia 05/21/2011   DVT of right leg (deep venous thrombosis) in 01/2011 05/21/2011   Fibroid uterus 03/20/2011   LBP (low back pain) 05/29/2010   Avitaminosis D 04/23/2010   Benign essential HTN 04/21/2010   Essential (primary) hypertension 04/21/2010   Allergic rhinitis 11/01/2009   Absolute anemia 11/01/2009   Headache, migraine 11/01/2009   Adiposity 11/01/2009    REFERRING DIAG: Lumbar radiculitis; Lumbar post-laminectomy syndrome  THERAPY DIAG:  Other low back pain  Pain in left leg  Muscle weakness (generalized)  Rationale for Evaluation and Treatment Rehabilitation  PERTINENT HISTORY: L5-S1 decompression microdiscectomy and laminectomy 2013; Posterior C5-C6 fusion 2014  PRECAUTIONS: None   SUBJECTIVE:  SUBJECTIVE STATEMENT: Patient reports she hasn't been able to take her medication so she is about at a 10/10 today.   PAIN:  Are you having pain? Yes:  NPRS scale: 10/10 (10/10 at worse) Pain location: Low back, left hip and back of leg Pain description: Aching and stabbing, shooting, numbness, tingling Aggravating factors: Sitting or walking and bending Relieving factors: Medication, a little exercise, heating pad   OBJECTIVE: (objective measures completed at initial evaluation unless otherwise dated) PATIENT SURVEYS:  Modified Oswestry 60% disability  07/08/22: 27/50, 54%           SENSATION: Patient report sensation deficit of left thight   MUSCLE LENGTH: Flexibility deficits noted bilateral hamstring (left worse than right) and hip musculature   POSTURE:             Increased lumbar lordosis   PALPATION: Global tenderness to palpation of lumbar and left gluteal region   LUMBAR ROM:    AROM eval 06/26/22  Flexion 25% - fingertips to knees 50% fingertips proximal shin  Extension 50%   Right lateral flexion 75%   Left lateral flexion 50%   Right rotation 50%   Left rotation 25%    (Blank rows = not tested)   Patient reports low back pain and left hip/posterior thigh pain with all lumbar movement   LOWER EXTREMITY MMT:     MMT Right eval Left eval Rt / Lt 07/13/2022  Hip flexion 4 4- 4 / 4-  Hip extension 3 3 3  / 3  Hip abduction 3 3 3  / 3  Hip adduction       Hip internal rotation       Hip external rotation       Knee flexion 5 4+   Knee extension 5 5   Ankle dorsiflexion 5 4+   Ankle plantarflexion       Ankle inversion       Ankle eversion 5 4+    (Blank rows = not tested)   LUMBAR SPECIAL TESTS:  Slump positive on left   FUNCTIONAL TESTS:  Unable to perform DLLT  06/29/22: 5 x STS : 26 sec without UE - standard Mat    GAIT: Assistive device utilized: Single point cane Level of assistance: Modified independence Comments: Slightly antalgic on the left     TODAY'S TREATMENT:   [facility] Adult PT Treatment:                                                DATE: 07/15/22 Therapeutic Exercise: Nustep L6 x 5 min with UE/LE while taking subjective Seated hamstring stretch 2 x 30 sec  Seated lumbar flexion physioball roll-out 5 x 10 sec LTR x 10 Wind shield wipers x 10 Piriformis stretch 2 x 20 sec each Bridge partial range 2 x 10 SLR with abdominal engagement 2 x 10 each Hooklying clamshell with blue 2 x 10 Sidelying hip abduction 2 x 10 each Sit to stand 2 x 10 Seated physioball press abdominal set fwd and lateral 10 x 5 sec  each   [facility] Adult PT Treatment:  DATE: 07/13/22 Therapeutic Exercise: Nustep L5 x 5 min with UE/LE while taking subjective Seated hamstring stretch 2 x 30 sec  Seated lumbar flexion physioball roll-out 5 x 10 sec Seated physioball press abdominal set fwd and lateral 10 x 5 sec each LTR x 10 Wind shield wipers x 10 Bridge partial range 2 x 10 SLR with abdominal engagement 2 x 10 each Hooklying clamshell with blue 2 x 10 Sit to stand 2 x 10  [facility] Adult PT Treatment:                                                DATE: 07/08/22 Therapeutic Exercise: Nustep L5 UE/LE x 6 minutes Seated lumbar flexion with ball STS x 10 PPT with March PPT to Bridge PPT with clam  PPT with SLR x 10 each  SKTC LTR feet narrow , feet wide  Supine piriformis stretch Seated hamstring stretch  [facility] Adult PT Treatment:                                                DATE: 07/01/22 Therapeutic Exercise: Nustep L5 x 7 minutes  Seated lumbar flexion with ball 5 sec x 10 STS x 10 with cues for eccentric control  Supine clam blue band bilat and unilateral with ab brace  Supine Physioball ab set 5 sec 10 x 2  PPT to bridge x 15  SLR with ab draw in 10 x 1 each Piriformis stretch LTR feet narrow, feet wide  PATIENT EDUCATION:  Education details: HEP Person educated: Patient Education method: Programmer, multimedia, Demonstration, Actor cues, Verbal cues Education comprehension: verbalized understanding, returned demonstration, verbal cues required, tactile cues required, and needs further education   HOME EXERCISE PROGRAM: Access Code: [email or web address]     ASSESSMENT: CLINICAL IMPRESSION: Patient tolerated therapy well with no adverse effects. She did report increased pain on arrival but was able to complete all prescribed exercises and progress of hip strengthening. Therapy continues to focus on improving mobility and progressing her core and hip strengthening. Overall  continues to exhibit gross strength deficit and she requires cueing for proper exercise technique. No changes made to her HEP.  Patient would benefit from continued skilled PT to progress her mobility and strength in order to reduce pain and maximize functional ability.     OBJECTIVE IMPAIRMENTS: Abnormal gait, decreased activity tolerance, difficulty walking, decreased ROM, decreased strength, impaired flexibility, postural dysfunction, and pain.    ACTIVITY LIMITATIONS: carrying, lifting, bending, sitting, standing, squatting, sleeping, transfers, bed mobility, bathing, hygiene/grooming, and locomotion level   PARTICIPATION LIMITATIONS: meal prep, cleaning, laundry, driving, shopping, community activity, and occupation   PERSONAL FACTORS: Fitness, Past/current experiences, Time since onset of injury/illness/exacerbation, and 1 comorbidity: see PMH above  are also affecting patient's functional outcome.      GOALS: Goals reviewed with patient? Yes   SHORT TERM GOALS: Target date: 07/03/2022   Patient will be I with initial HEP in order to progress with therapy. Baseline: HEP provided at eval 06/29/22: compliance and independent  Goal status: MET   2.  Patient will report </= 7/10 pain in order to reduce functional limitations Baseline: 9/10 06/29/22: 8/10  07/08/22: 9/10 Goal status: ONGOING   LONG TERM GOALS: Target  date: 09/15/2022   Patient will be I with final HEP to maintain progress from PT. Baseline: HEP provided at eval Goal status: INITIAL   2.  Patient will report Modified Oswestry </= 40% in order to indicate improved functional ability and reduced pain Baseline: 60% 07/08/22: 54% Goal status: ONGOING   3.  Patient will demonstrate a 25% improvement in lumbar AROM in order to improve dressing and self care ability Baseline: see lumbar AROM limitations noted above Goal status: INITIAL   4.  Patient will exhibit improved hip strength >/= 4-/5 MMT in order to improve  standing and walking tolerance Baseline: 3/5 MMT Goal status: INITIAL     PLAN: PT FREQUENCY: 1-2x/week   PT DURATION: 8 weeks   PLANNED INTERVENTIONS: Therapeutic exercises, Therapeutic activity, Neuromuscular re-education, Balance training, Gait training, Patient/Family education, Self Care, Joint mobilization, Aquatic Therapy, Dry Needling, Electrical stimulation, Cryotherapy, Moist heat, Manual therapy, and Re-evaluation.   PLAN FOR NEXT SESSION: Review HEP and progress PRN, manual/modalities for lumbar pain relief, progress lumbar and hip stretching and progress core stabilization and hip strengthening as tolerated     Rosana Hoes, PT, DPT, LAT, ATC 08/04/22  8:25 AM Phone: [phone] Fax: [phone]

## 2022-07-15 ENCOUNTER — Other Ambulatory Visit: Payer: Self-pay | Admitting: Registered Nurse

## 2022-07-15 ENCOUNTER — Encounter: Payer: Self-pay | Admitting: Physical Therapy

## 2022-07-15 ENCOUNTER — Ambulatory Visit: Payer: Medicaid Other | Admitting: Physical Therapy

## 2022-07-15 ENCOUNTER — Other Ambulatory Visit: Payer: Self-pay

## 2022-07-15 DIAGNOSIS — M6281 Muscle weakness (generalized): Secondary | ICD-10-CM

## 2022-07-15 DIAGNOSIS — M5459 Other low back pain: Secondary | ICD-10-CM

## 2022-07-15 DIAGNOSIS — M79605 Pain in left leg: Secondary | ICD-10-CM

## 2022-07-31 ENCOUNTER — Encounter: Payer: Medicaid Other | Attending: Physical Medicine and Rehabilitation | Admitting: Physical Medicine & Rehabilitation

## 2022-07-31 ENCOUNTER — Encounter: Payer: Self-pay | Admitting: Physical Medicine & Rehabilitation

## 2022-07-31 VITALS — BP 145/93 | HR 80 | Ht 65.0 in | Wt 279.0 lb

## 2022-07-31 DIAGNOSIS — M7061 Trochanteric bursitis, right hip: Secondary | ICD-10-CM | POA: Diagnosis not present

## 2022-07-31 DIAGNOSIS — M5416 Radiculopathy, lumbar region: Secondary | ICD-10-CM

## 2022-07-31 MED ORDER — OXYCODONE-ACETAMINOPHEN 10-325 MG PO TABS: ORAL_TABLET | ORAL | 0 refills | Status: DC

## 2022-07-31 NOTE — Progress Notes (Signed)
Subjective:    Patient ID: [name], female    DOB: 01-Jun-1970, 52 y.o.   MRN: [id number]  HPI 52 year old female with chronic low back and left hip pain.  She has had a lumbar laminectomy at L5-S1 level with decompressive microdiscectomy on 08/31/2011.  She had a large central disc at that time impinging upon the S1 nerve roots. PT helped a little strength/balance  but not pain and still has a lot of numbness and tingling   Pain scores still 9-10/10  No falls Still amb with a cane outside of the house , feels off bance with inclines and uneven surfaces due to numbness and weakness in posterior thigh and lateral hip Pain Inventory Average Pain 9 Pain Right Now 10 My pain is constant, sharp, dull, stabbing, tingling, and aching  In the last 24 hours, has pain interfered with the following? General activity 9 Relation with others 9 Enjoyment of life 9 What TIME of day is your pain at its worst? morning , daytime, evening, night, and varies Sleep (in general) Poor  Pain is worse with: walking, bending, sitting, and standing Pain improves with: rest, heat/ice, therapy/exercise, and medication Relief from Meds: 2  Family History  Problem Relation Age of Onset   Hypertension Maternal Grandmother    Anesthesia problems Neg Hx    Social History   Socioeconomic History   Marital status: Divorced    Spouse name: Not on file   Number of children: Not on file   Years of education: Not on file   Highest education level: Not on file  Occupational History   Not on file  Tobacco Use   Smoking status: Never   Smokeless tobacco: Never  Vaping Use   Vaping Use: Never used  Substance and Sexual Activity   Alcohol use: No   Drug use: No   Sexual activity: Not Currently    Birth control/protection: None    Comment: IUD removed 2 wks ago.  Other Topics Concern   Not on file  Social History Narrative   Not on file   Social Determinants of Health   Financial Resource Strain:  Not on file  Food Insecurity: Not on file  Transportation Needs: Not on file  Physical Activity: Not on file  Stress: Not on file  Social Connections: Not on file   Past Surgical History:  Procedure Laterality Date   ABDOMINAL HYSTERECTOMY  06/29/11   Robotic Assisted Hysterectomy   CERVICAL SPINE SURGERY  10   CESAREAN SECTION  1996   LUMBAR LAMINECTOMY  08/31/2011   Procedure: MICRODISCECTOMY LUMBAR LAMINECTOMY;  Surgeon: Jacki Cones, MD;  Location: [facility];  Service: Orthopedics;  Laterality: N/A;  lumbar five sacral one central microdisectomy   MYOMECTOMY  13   NECK SURGERY     POSTERIOR CERVICAL FUSION/FORAMINOTOMY N/A 02/02/2013   Procedure: POSTERIOR C5-6 SPINAL FUSION;  Surgeon: Venita Lick, MD;  Location: [facility];  Service: Orthopedics;  Laterality: N/A;   POSTERIOR FUSION CERVICAL SPINE  02/02/2013   C 5 C6     Dr Shon Baton   WISDOM TOOTH EXTRACTION     Past Surgical History:  Procedure Laterality Date   ABDOMINAL HYSTERECTOMY  06/29/11   Robotic Assisted Hysterectomy   CERVICAL SPINE SURGERY  10   CESAREAN SECTION  1996   LUMBAR LAMINECTOMY  08/31/2011   Procedure: MICRODISCECTOMY LUMBAR LAMINECTOMY;  Surgeon: Jacki Cones, MD;  Location: [facility];  Service: Orthopedics;  Laterality: N/A;  lumbar five sacral one  central microdisectomy   MYOMECTOMY  13   NECK SURGERY     POSTERIOR CERVICAL FUSION/FORAMINOTOMY N/A 02/02/2013   Procedure: POSTERIOR C5-6 SPINAL FUSION;  Surgeon: Venita Lick, MD;  Location: [facility];  Service: Orthopedics;  Laterality: N/A;   POSTERIOR FUSION CERVICAL SPINE  02/02/2013   C 5 C6     Dr Shon Baton   WISDOM TOOTH EXTRACTION     Past Medical History:  Diagnosis Date   Anemia    hx   Arthritis    Complication of anesthesia    slow to wake up,   very difficult to get iv had picc line last time   Difficult intravenous access    hands are usually best for blood draws and IV starts   DVT (deep venous thrombosis) (HCC)    ? vs lupus   Fibroids     Headache(784.0)    Neck pain    Scoliosis    BP (!) 145/93   Pulse 80   Ht 5\' 5"  (1.651 m)   Wt 279 lb (126.6 kg)   LMP 02/27/2011   SpO2 98%   BMI 46.43 kg/m   Opioid Risk Score:   Fall Risk Score:  `1  Depression screen [facility] 2/9     07/31/2022   11:48 AM 06/01/2022   11:24 AM 05/19/2022   10:44 AM 03/11/2022   11:19 AM 10/15/2021   11:27 AM 07/08/2021   11:27 AM 03/28/2021   11:26 AM  Depression screen PHQ 2/9  Decreased Interest 0 0 0 0 0 0 0  Down, Depressed, Hopeless 0 0 0 0 0 0 0  PHQ - 2 Score 0 0 0 0 0 0 0     Review of Systems  Musculoskeletal:  Positive for back pain.       LT leg pain  All other systems reviewed and are negative.     Objective:   Physical Exam The patient notes a numbness and tingling sensation left posterior thigh there is reduced sensation in the anterior thigh on the left side Equal sensation in the L4-L5 and S1 regions Negative straight leg raise bilaterally Negative femoral stretch test on the left side Motor strength is 5/5 bilateral hip flexor knee extensor ankle dorsiflexor Ambulates without assistive device no evidence of toe drag or knee instability. Negative Faber's test There is mild tenderness palpation bilateral PSIS area as well as lumbar paraspinal area.  Tenderness over the left greater trochanter as well.    Assessment & Plan:  1.  Posterior and lateral thigh pain neurogenic descriptors of numbness and tingling.  She has had chronic pain in this area greater than 6 months that has not responded to physical therapy although she feels her balance is a little bit better since starting. Her last MRI was in 2013 and is discussed with patient this would be outside of the L5-S1 distribution of her prior compression.  Will review repeat MRI lumbar spine using contrast given her history of discectomy, hope to differentiate between chronic scar and new disc herniation  Depending on findings May consider lumbar epidural steroid  injection

## 2022-08-04 ENCOUNTER — Ambulatory Visit: Payer: Medicaid Other | Attending: Physical Medicine & Rehabilitation | Admitting: Physical Therapy

## 2022-08-04 ENCOUNTER — Encounter: Payer: Self-pay | Admitting: Physical Therapy

## 2022-08-04 DIAGNOSIS — M6281 Muscle weakness (generalized): Secondary | ICD-10-CM | POA: Diagnosis present

## 2022-08-04 DIAGNOSIS — M79605 Pain in left leg: Secondary | ICD-10-CM | POA: Insufficient documentation

## 2022-08-04 DIAGNOSIS — M5459 Other low back pain: Secondary | ICD-10-CM | POA: Insufficient documentation

## 2022-08-04 NOTE — Addendum Note (Signed)
Addended by: Hilbert Bible on: 08/04/2022 08:28 AM   Modules accepted: Orders

## 2022-08-04 NOTE — Therapy (Signed)
OUTPATIENT PHYSICAL THERAPY TREATMENT NOTE   Patient Name: [name] MRN: [id number] DOB:02/24/1971, 52 y.o., female Today's Date: 08/04/2022  PCP: Andi Devon, MD REFERRING PROVIDER: Erick Colace, MD   END OF SESSION:   PT End of Session - 08/04/22 1106     Visit Number 9    Number of Visits 17    Date for PT Re-Evaluation 09/15/22    Authorization Type [employer]    Authorization - Number of Visits 27    PT Start Time 1105    PT Stop Time 1145    PT Time Calculation (min) 40 min                 Past Medical History:  Diagnosis Date   Anemia    hx   Arthritis    Complication of anesthesia    slow to wake up,   very difficult to get iv had picc line last time   Difficult intravenous access    hands are usually best for blood draws and IV starts   DVT (deep venous thrombosis) (HCC)    ? vs lupus   Fibroids    Headache(784.0)    Neck pain    Scoliosis    Past Surgical History:  Procedure Laterality Date   ABDOMINAL HYSTERECTOMY  06/29/11   Robotic Assisted Hysterectomy   CERVICAL SPINE SURGERY  10   CESAREAN SECTION  1996   LUMBAR LAMINECTOMY  08/31/2011   Procedure: MICRODISCECTOMY LUMBAR LAMINECTOMY;  Surgeon: Jacki Cones, MD;  Location: [facility];  Service: Orthopedics;  Laterality: N/A;  lumbar five sacral one central microdisectomy   MYOMECTOMY  13   NECK SURGERY     POSTERIOR CERVICAL FUSION/FORAMINOTOMY N/A 02/02/2013   Procedure: POSTERIOR C5-6 SPINAL FUSION;  Surgeon: Venita Lick, MD;  Location: [facility];  Service: Orthopedics;  Laterality: N/A;   POSTERIOR FUSION CERVICAL SPINE  02/02/2013   C 5 C6     Dr Shon Baton   WISDOM TOOTH EXTRACTION     Patient Active Problem List   Diagnosis Date Noted   Greater trochanteric bursitis of right hip 02/12/2015   Metabolic syndrome 08/27/2014   Other constipation 05/24/2014   Greater trochanteric bursitis of left hip 09/08/2013   UTI (urinary tract infection) 09/03/2011   Hypokalemia  09/03/2011   Cauda equina syndrome (HCC) 09/03/2011   Spinal stenosis of lumbar region 08/31/2011   Lumbar herniated disc 08/31/2011   Abdominal pain, acute, generalized 08/28/2011   Nausea and vomiting 08/28/2011   Dehydration 08/28/2011   S/P hysterectomy 4/13 08/28/2011   Menometrorrhagia 05/21/2011   DVT of right leg (deep venous thrombosis) in 01/2011 05/21/2011   Fibroid uterus 03/20/2011   LBP (low back pain) 05/29/2010   Avitaminosis D 04/23/2010   Benign essential HTN 04/21/2010   Essential (primary) hypertension 04/21/2010   Allergic rhinitis 11/01/2009   Absolute anemia 11/01/2009   Headache, migraine 11/01/2009   Adiposity 11/01/2009    REFERRING DIAG: Lumbar radiculitis; Lumbar post-laminectomy syndrome  THERAPY DIAG:  Other low back pain  Pain in left leg  Muscle weakness (generalized)  Rationale for Evaluation and Treatment Rehabilitation  PERTINENT HISTORY: L5-S1 decompression microdiscectomy and laminectomy 2013; Posterior C5-C6 fusion 2014  PRECAUTIONS: None   SUBJECTIVE:  SUBJECTIVE STATEMENT: Saw MD last Friday recommended MRI. Waiting to schedule.  Pain has been up and down, can get as low as 7/10. My pain is 10/10 today. I have been doing all of my exercises. The exercises are very helpful. The numbness still runs down leg but is better than it was.    PAIN:  Are you having pain? Yes:  NPRS scale: 10/10 (10/10 at worse) Pain location: Low back, left hip and back of leg Pain description: Aching and stabbing, shooting, numbness, tingling Aggravating factors: Sitting or walking and bending Relieving factors: Medication, a little exercise, heating pad   OBJECTIVE: (objective measures completed at initial evaluation unless otherwise dated) PATIENT SURVEYS:  Modified  Oswestry 60% disability  07/08/22: 27/50, 54%          SENSATION: Patient report sensation deficit of left thight   MUSCLE LENGTH: Flexibility deficits noted bilateral hamstring (left worse than right) and hip musculature   POSTURE:             Increased lumbar lordosis   PALPATION: Global tenderness to palpation of lumbar and left gluteal region   LUMBAR ROM:    AROM eval 06/26/22 08/04/22  Flexion 25% - fingertips to knees 50% fingertips proximal shin Nearly reaches toes   Extension 50%    Right lateral flexion 75%  75%  Left lateral flexion 50%  75%  Right rotation 50%  50%  Left rotation 25%  75%   (Blank rows = not tested)   Patient reports low back pain and left hip/posterior thigh pain with all lumbar movement   LOWER EXTREMITY MMT:     MMT Right eval Left eval Rt / Lt 07/13/2022 Rt/LT 08/04/22  Hip flexion 4 4- 4 / 4- 4/4  Hip extension 3 3 3  / 3 3 /3+  Hip abduction 3 3 3  / 3 4-/4-  Hip adduction        Hip internal rotation        Hip external rotation        Knee flexion 5 4+    Knee extension 5 5    Ankle dorsiflexion 5 4+    Ankle plantarflexion        Ankle inversion        Ankle eversion 5 4+     (Blank rows = not tested)   LUMBAR SPECIAL TESTS:  Slump positive on left   FUNCTIONAL TESTS:  Unable to perform DLLT  06/29/22: 5 x STS : 26 sec without UE - standard Mat  08/04/22: 5 x STS : 21.4  sec with out UE- standard Mat    GAIT: Assistive device utilized: Single point cane Level of assistance: Modified independence Comments: Slightly antalgic on the left     TODAY'S TREATMENT:   [facility] Adult PT Treatment:                                                DATE: 08/04/22 Therapeutic Exercise: Hip abduction x 10 each Side clam x 10 each  Prone alternating hip extension Nustep L6 x 5 minutes  Standing heel raises Standing hip abduction x 10 each  Standing hip extension x 10 each , one set with forearms on counter x 10 each      [facility] Adult PT  Treatment:  DATE: 07/15/22 Therapeutic Exercise: Nustep L6 x 5 min with UE/LE while taking subjective Seated hamstring stretch 2 x 30 sec  Seated lumbar flexion physioball roll-out 5 x 10 sec LTR x 10 Wind shield wipers x 10 Piriformis stretch 2 x 20 sec each Bridge partial range 2 x 10 SLR with abdominal engagement 2 x 10 each Hooklying clamshell with blue 2 x 10 Sidelying hip abduction 2 x 10 each Sit to stand 2 x 10 Seated physioball press abdominal set fwd and lateral 10 x 5 sec each   [facility] Adult PT Treatment:                                                DATE: 07/13/22 Therapeutic Exercise: Nustep L5 x 5 min with UE/LE while taking subjective Seated hamstring stretch 2 x 30 sec  Seated lumbar flexion physioball roll-out 5 x 10 sec Seated physioball press abdominal set fwd and lateral 10 x 5 sec each LTR x 10 Wind shield wipers x 10 Bridge partial range 2 x 10 SLR with abdominal engagement 2 x 10 each Hooklying clamshell with blue 2 x 10 Sit to stand 2 x 10  [facility] Adult PT Treatment:                                                DATE: 07/08/22 Therapeutic Exercise: Nustep L5 UE/LE x 6 minutes Seated lumbar flexion with ball STS x 10 PPT with March PPT to Bridge PPT with clam  PPT with SLR x 10 each  SKTC LTR feet narrow , feet wide  Supine piriformis stretch Seated hamstring stretch  [facility] Adult PT Treatment:                                                DATE: 07/01/22 Therapeutic Exercise: Nustep L5 x 7 minutes  Seated lumbar flexion with ball 5 sec x 10 STS x 10 with cues for eccentric control  Supine clam blue band bilat and unilateral with ab brace  Supine Physioball ab set 5 sec 10 x 2  PPT to bridge x 15  SLR with ab draw in 10 x 1 each Piriformis stretch LTR feet narrow, feet wide  PATIENT EDUCATION:  Education details: HEP Person educated: Patient Education method: Programmer, multimedia, Demonstration, Actor  cues, Verbal cues Education comprehension: verbalized understanding, returned demonstration, verbal cues required, tactile cues required, and needs further education   HOME EXERCISE PROGRAM: Access Code: [email or web address]     ASSESSMENT: CLINICAL IMPRESSION: Patient tolerated therapy well with no adverse effects. She did report increased pain on arrival but was able to complete all prescribed exercises and progress of hip strengthening. Her MMT and AROM have improved. She reports less radiating pain in left thigh. Therapy continues to focus on improving mobility and progressing her core and hip strengthening. 5 x STS improved. Tolerated progression of HEP.  Patient would benefit from continued skilled PT to progress her mobility and strength in order to reduce pain and maximize functional ability.     OBJECTIVE IMPAIRMENTS: Abnormal gait, decreased activity tolerance, difficulty walking, decreased  ROM, decreased strength, impaired flexibility, postural dysfunction, and pain.    ACTIVITY LIMITATIONS: carrying, lifting, bending, sitting, standing, squatting, sleeping, transfers, bed mobility, bathing, hygiene/grooming, and locomotion level   PARTICIPATION LIMITATIONS: meal prep, cleaning, laundry, driving, shopping, community activity, and occupation   PERSONAL FACTORS: Fitness, Past/current experiences, Time since onset of injury/illness/exacerbation, and 1 comorbidity: see PMH above  are also affecting patient's functional outcome.      GOALS: Goals reviewed with patient? Yes   SHORT TERM GOALS: Target date: 07/03/2022   Patient will be I with initial HEP in order to progress with therapy. Baseline: HEP provided at eval 06/29/22: compliance and independent  Goal status: MET   2.  Patient will report </= 7/10 pain in order to reduce functional limitations Baseline: 9/10 06/29/22: 8/10  07/08/22: 9/10 08/04/22: never lower than 7/10, 10/10 today.  Goal status: ONGOING   LONG TERM GOALS: Target  date: 09/15/2022   Patient will be I with final HEP to maintain progress from PT. Baseline: HEP provided at eval Goal status: INITIAL   2.  Patient will report Modified Oswestry </= 40% in order to indicate improved functional ability and reduced pain Baseline: 60% 07/08/22: 54% Goal status: ONGOING   3.  Patient will demonstrate a 25% improvement in lumbar AROM in order to improve dressing and self care ability Baseline: see lumbar AROM limitations noted above 08/04/22: see chart Goal status:    4.  Patient will exhibit improved hip strength >/= 4-/5 MMT in order to improve standing and walking tolerance Baseline: 3/5 MMT Goal status: INITIAL     PLAN: PT FREQUENCY: 1-2x/week   PT DURATION: 8 weeks   PLANNED INTERVENTIONS: Therapeutic exercises, Therapeutic activity, Neuromuscular re-education, Balance training, Gait training, Patient/Family education, Self Care, Joint mobilization, Aquatic Therapy, Dry Needling, Electrical stimulation, Cryotherapy, Moist heat, Manual therapy, and Re-evaluation.   PLAN FOR NEXT SESSION: Review HEP and progress PRN, manual/modalities for lumbar pain relief, progress lumbar and hip stretching and progress core stabilization and hip strengthening as tolerated     Jannette Spanner, PTA 08/04/22 12:48 PM Phone: [phone] Fax: [phone]

## 2022-08-06 NOTE — Therapy (Signed)
OUTPATIENT PHYSICAL THERAPY TREATMENT NOTE   Patient Name: [name] MRN: [id number] DOB:1971-03-06, 52 y.o., female Today's Date: 08/07/2022  PCP: Andi Devon, MD REFERRING PROVIDER: Erick Colace, MD   END OF SESSION:   PT End of Session - 08/07/22 1023     Visit Number 10    Number of Visits 17    Date for PT Re-Evaluation 09/15/22    Authorization Type [facility]    Authorization - Number of Visits 27    PT Start Time 1016    PT Stop Time 1058    PT Time Calculation (min) 42 min    Activity Tolerance Patient tolerated treatment well    Behavior During Therapy WFL for tasks assessed/performed                  Past Medical History:  Diagnosis Date   Anemia    hx   Arthritis    Complication of anesthesia    slow to wake up,   very difficult to get iv had picc line last time   Difficult intravenous access    hands are usually best for blood draws and IV starts   DVT (deep venous thrombosis) (HCC)    ? vs lupus   Fibroids    Headache(784.0)    Neck pain    Scoliosis    Past Surgical History:  Procedure Laterality Date   ABDOMINAL HYSTERECTOMY  06/29/11   Robotic Assisted Hysterectomy   CERVICAL SPINE SURGERY  10   CESAREAN SECTION  1996   LUMBAR LAMINECTOMY  08/31/2011   Procedure: MICRODISCECTOMY LUMBAR LAMINECTOMY;  Surgeon: Jacki Cones, MD;  Location: [facility];  Service: Orthopedics;  Laterality: N/A;  lumbar five sacral one central microdisectomy   MYOMECTOMY  13   NECK SURGERY     POSTERIOR CERVICAL FUSION/FORAMINOTOMY N/A 02/02/2013   Procedure: POSTERIOR C5-6 SPINAL FUSION;  Surgeon: Venita Lick, MD;  Location: [facility];  Service: Orthopedics;  Laterality: N/A;   POSTERIOR FUSION CERVICAL SPINE  02/02/2013   C 5 C6     Dr Shon Baton   WISDOM TOOTH EXTRACTION     Patient Active Problem List   Diagnosis Date Noted   Greater trochanteric bursitis of right hip 02/12/2015   Metabolic syndrome 08/27/2014   Other constipation 05/24/2014    Greater trochanteric bursitis of left hip 09/08/2013   UTI (urinary tract infection) 09/03/2011   Hypokalemia 09/03/2011   Cauda equina syndrome (HCC) 09/03/2011   Spinal stenosis of lumbar region 08/31/2011   Lumbar herniated disc 08/31/2011   Abdominal pain, acute, generalized 08/28/2011   Nausea and vomiting 08/28/2011   Dehydration 08/28/2011   S/P hysterectomy 4/13 08/28/2011   Menometrorrhagia 05/21/2011   DVT of right leg (deep venous thrombosis) in 01/2011 05/21/2011   Fibroid uterus 03/20/2011   LBP (low back pain) 05/29/2010   Avitaminosis D 04/23/2010   Benign essential HTN 04/21/2010   Essential (primary) hypertension 04/21/2010   Allergic rhinitis 11/01/2009   Absolute anemia 11/01/2009   Headache, migraine 11/01/2009   Adiposity 11/01/2009    REFERRING DIAG: Lumbar radiculitis; Lumbar post-laminectomy syndrome  THERAPY DIAG:  Other low back pain  Pain in left leg  Muscle weakness (generalized)  Rationale for Evaluation and Treatment Rehabilitation  PERTINENT HISTORY: L5-S1 decompression microdiscectomy and laminectomy 2013; Posterior C5-C6 fusion 2014  PRECAUTIONS: None   SUBJECTIVE:  SUBJECTIVE STATEMENT: Patient reports she is doing alright today. She feels pain in the lower back and back of her legs. The bad weather doesn't help her pain.   PAIN:  Are you having pain? Yes:  NPRS scale: 8/10 (10/10 at worse) Pain location: Low back, left hip and back of leg Pain description: Aching and stabbing, shooting, numbness, tingling Aggravating factors: Sitting or walking and bending Relieving factors: Medication, a little exercise, heating pad   OBJECTIVE: (objective measures completed at initial evaluation unless otherwise dated) PATIENT SURVEYS:  Modified Oswestry 60%  disability  07/08/22: 27/50, 54% 08/07/2022: 28/50, 56%          SENSATION: Patient report sensation deficit of left thight   MUSCLE LENGTH: Flexibility deficits noted bilateral hamstring (left worse than right) and hip musculature   POSTURE:             Increased lumbar lordosis   PALPATION: Global tenderness to palpation of lumbar and left gluteal region   LUMBAR ROM:    AROM eval 06/26/22 08/04/22  Flexion 25% - fingertips to knees 50% fingertips proximal shin Nearly reaches toes   Extension 50%    Right lateral flexion 75%  75%  Left lateral flexion 50%  75%  Right rotation 50%  50%  Left rotation 25%  75%   (Blank rows = not tested)   Patient reports low back pain and left hip/posterior thigh pain with all lumbar movement   LOWER EXTREMITY MMT:     MMT Right eval Left eval Rt / Lt 07/13/2022 Rt/LT 08/04/22  Hip flexion 4 4- 4 / 4- 4/4  Hip extension 3 3 3  / 3 3 /3+  Hip abduction 3 3 3  / 3 4-/4-  Hip adduction        Hip internal rotation        Hip external rotation        Knee flexion 5 4+    Knee extension 5 5    Ankle dorsiflexion 5 4+    Ankle plantarflexion        Ankle inversion        Ankle eversion 5 4+     (Blank rows = not tested)   LUMBAR SPECIAL TESTS:  Slump positive on left   FUNCTIONAL TESTS:  Unable to perform DLLT  06/29/22: 5 x STS : 26 sec without UE - standard Mat  08/04/22: 21.4  sec with out UE- standard Mat    GAIT: Assistive device utilized: Single point cane Level of assistance: Modified independence Comments: Slightly antalgic on the left     TODAY'S TREATMENT:   [facility] Adult PT Treatment:                                                DATE: 08/07/22 Therapeutic Exercise: NuStep L6 x 6 min with UE/LE while taking subjective Seated lumbar flexion physioball (black) roll-out 10 x 5 sec Seated hamstring stretch 2 x 20 sec  LTR x 10 Piriformis stretch 2 x 20 sec each Bridge partial range 2 x 10 SLR with abdominal engagement 2 x  10 each Sidelying hip abduction 2 x 10 each Sit to stand 2 x 10 Standing hip extension 2 x 10 each with forearms on counter    [facility] Adult PT Treatment:  DATE: 08/04/22 Therapeutic Exercise: Hip abduction x 10 each Side clam x 10 each  Prone alternating hip extension Nustep L6 x 5 minutes  Standing heel raises Standing hip abduction x 10 each  Standing hip extension x 10 each , one set with forearms on counter x 10 each   [facility] Adult PT Treatment:                                                DATE: 07/15/22 Therapeutic Exercise: Nustep L6 x 5 min with UE/LE while taking subjective Seated hamstring stretch 2 x 30 sec  Seated lumbar flexion physioball roll-out 5 x 10 sec LTR x 10 Wind shield wipers x 10 Piriformis stretch 2 x 20 sec each Bridge partial range 2 x 10 SLR with abdominal engagement 2 x 10 each Hooklying clamshell with blue 2 x 10 Sidelying hip abduction 2 x 10 each Sit to stand 2 x 10 Seated physioball press abdominal set fwd and lateral 10 x 5 sec each  PATIENT EDUCATION:  Education details: HEP Person educated: Patient Education method: Programmer, multimedia, Demonstration, Actor cues, Verbal cues Education comprehension: verbalized understanding, returned demonstration, verbal cues required, tactile cues required, and needs further education   HOME EXERCISE PROGRAM: Access Code: [email or web address]     ASSESSMENT: CLINICAL IMPRESSION: Patient tolerated therapy well with no adverse effects. She does report a slight reduction in functional ability on ODI this visit but does subjectively report improvement in symptoms. She is progressing with her stretching and strengthening exercises with report of pulling in her lower back and back of her legs with exercises. She does require cueing for core activation with exercises proper technique. No changes were made to HEP this visit. Patient would benefit from continued skilled PT to progress her  mobility and strength in order to reduce pain and maximize functional ability.     OBJECTIVE IMPAIRMENTS: Abnormal gait, decreased activity tolerance, difficulty walking, decreased ROM, decreased strength, impaired flexibility, postural dysfunction, and pain.    ACTIVITY LIMITATIONS: carrying, lifting, bending, sitting, standing, squatting, sleeping, transfers, bed mobility, bathing, hygiene/grooming, and locomotion level   PARTICIPATION LIMITATIONS: meal prep, cleaning, laundry, driving, shopping, community activity, and occupation   PERSONAL FACTORS: Fitness, Past/current experiences, Time since onset of injury/illness/exacerbation, and 1 comorbidity: see PMH above  are also affecting patient's functional outcome.      GOALS: Goals reviewed with patient? Yes   SHORT TERM GOALS: Target date: 07/03/2022   Patient will be I with initial HEP in order to progress with therapy. Baseline: HEP provided at eval 06/29/22: compliance and independent  Goal status: MET   2.  Patient will report </= 7/10 pain in order to reduce functional limitations Baseline: 9/10 06/29/22: 8/10  07/08/22: 9/10 08/04/22: never lower than 7/10, 10/10 today.  Goal status: ONGOING   LONG TERM GOALS: Target date: 09/15/2022   Patient will be I with final HEP to maintain progress from PT. Baseline: HEP provided at eval 08/07/2022: progressing Goal status: ONGOING   2.  Patient will report Modified Oswestry </= 40% in order to indicate improved functional ability and reduced pain Baseline: 60% 07/08/22: 54% 08/07/2022:  Goal status: ONGOING   3.  Patient will demonstrate a 25% improvement in lumbar AROM in order to improve dressing and self care ability Baseline: see lumbar AROM limitations noted above 08/04/22: see chart Goal status: ONGOING  4.  Patient will exhibit improved hip strength >/= 4-/5 MMT in order to improve standing and walking tolerance Baseline: 3/5 MMT 08/07/2022: seated above Goal status:  ONGOING     PLAN: PT FREQUENCY: 1-2x/week   PT DURATION: 8 weeks   PLANNED INTERVENTIONS: Therapeutic exercises, Therapeutic activity, Neuromuscular re-education, Balance training, Gait training, Patient/Family education, Self Care, Joint mobilization, Aquatic Therapy, Dry Needling, Electrical stimulation, Cryotherapy, Moist heat, Manual therapy, and Re-evaluation.   PLAN FOR NEXT SESSION: Review HEP and progress PRN, manual/modalities for lumbar pain relief, progress lumbar and hip stretching and progress core stabilization and hip strengthening as tolerated     Rosana Hoes, PT, DPT, LAT, ATC 08/07/22  11:02 AM Phone: [phone] Fax: [phone]

## 2022-08-07 ENCOUNTER — Ambulatory Visit: Payer: Medicaid Other | Admitting: Physical Therapy

## 2022-08-07 ENCOUNTER — Other Ambulatory Visit: Payer: Self-pay

## 2022-08-07 ENCOUNTER — Encounter: Payer: Self-pay | Admitting: Physical Therapy

## 2022-08-07 DIAGNOSIS — M6281 Muscle weakness (generalized): Secondary | ICD-10-CM

## 2022-08-07 DIAGNOSIS — M5459 Other low back pain: Secondary | ICD-10-CM

## 2022-08-07 DIAGNOSIS — M79605 Pain in left leg: Secondary | ICD-10-CM

## 2022-08-10 ENCOUNTER — Encounter: Payer: Self-pay | Admitting: Physical Therapy

## 2022-08-10 ENCOUNTER — Ambulatory Visit: Payer: Medicaid Other | Admitting: Physical Therapy

## 2022-08-10 DIAGNOSIS — M6281 Muscle weakness (generalized): Secondary | ICD-10-CM

## 2022-08-10 DIAGNOSIS — M5459 Other low back pain: Secondary | ICD-10-CM

## 2022-08-10 DIAGNOSIS — M79605 Pain in left leg: Secondary | ICD-10-CM

## 2022-08-10 NOTE — Therapy (Signed)
OUTPATIENT PHYSICAL THERAPY TREATMENT NOTE   Patient Name: [name] MRN: [id number] DOB:05-21-1970, 52 y.o., female Today's Date: 08/10/2022  PCP: Andi Devon, MD REFERRING PROVIDER: Erick Colace, MD   END OF SESSION:   PT End of Session - 08/10/22 1016     Visit Number 11    Number of Visits 17    Date for PT Re-Evaluation 09/15/22    Authorization Type [employer]    Authorization - Number of Visits 27    PT Start Time 1015    PT Stop Time 1100    PT Time Calculation (min) 45 min                  Past Medical History:  Diagnosis Date   Anemia    hx   Arthritis    Complication of anesthesia    slow to wake up,   very difficult to get iv had picc line last time   Difficult intravenous access    hands are usually best for blood draws and IV starts   DVT (deep venous thrombosis) (HCC)    ? vs lupus   Fibroids    Headache(784.0)    Neck pain    Scoliosis    Past Surgical History:  Procedure Laterality Date   ABDOMINAL HYSTERECTOMY  06/29/11   Robotic Assisted Hysterectomy   CERVICAL SPINE SURGERY  10   CESAREAN SECTION  1996   LUMBAR LAMINECTOMY  08/31/2011   Procedure: MICRODISCECTOMY LUMBAR LAMINECTOMY;  Surgeon: Jacki Cones, MD;  Location: [facility];  Service: Orthopedics;  Laterality: N/A;  lumbar five sacral one central microdisectomy   MYOMECTOMY  13   NECK SURGERY     POSTERIOR CERVICAL FUSION/FORAMINOTOMY N/A 02/02/2013   Procedure: POSTERIOR C5-6 SPINAL FUSION;  Surgeon: Venita Lick, MD;  Location: [facility];  Service: Orthopedics;  Laterality: N/A;   POSTERIOR FUSION CERVICAL SPINE  02/02/2013   C 5 C6     Dr Shon Baton   WISDOM TOOTH EXTRACTION     Patient Active Problem List   Diagnosis Date Noted   Greater trochanteric bursitis of right hip 02/12/2015   Metabolic syndrome 08/27/2014   Other constipation 05/24/2014   Greater trochanteric bursitis of left hip 09/08/2013   UTI (urinary tract infection) 09/03/2011   Hypokalemia  09/03/2011   Cauda equina syndrome (HCC) 09/03/2011   Spinal stenosis of lumbar region 08/31/2011   Lumbar herniated disc 08/31/2011   Abdominal pain, acute, generalized 08/28/2011   Nausea and vomiting 08/28/2011   Dehydration 08/28/2011   S/P hysterectomy 4/13 08/28/2011   Menometrorrhagia 05/21/2011   DVT of right leg (deep venous thrombosis) in 01/2011 05/21/2011   Fibroid uterus 03/20/2011   LBP (low back pain) 05/29/2010   Avitaminosis D 04/23/2010   Benign essential HTN 04/21/2010   Essential (primary) hypertension 04/21/2010   Allergic rhinitis 11/01/2009   Absolute anemia 11/01/2009   Headache, migraine 11/01/2009   Adiposity 11/01/2009    REFERRING DIAG: Lumbar radiculitis; Lumbar post-laminectomy syndrome  THERAPY DIAG:  Other low back pain  Pain in left leg  Muscle weakness (generalized)  Rationale for Evaluation and Treatment Rehabilitation  PERTINENT HISTORY: L5-S1 decompression microdiscectomy and laminectomy 2013; Posterior C5-C6 fusion 2014  PRECAUTIONS: None   SUBJECTIVE:  SUBJECTIVE STATEMENT: Patient reports she is doing alright today. Her pain is rated at 8/10 and reports that pain in running down back of both legs today.   PAIN:  Are you having pain? Yes:  NPRS scale: 8/10 (10/10 at worse) Pain location: Low back, left hip and back of leg Pain description: Aching and stabbing, shooting, numbness, tingling Aggravating factors: Sitting or walking and bending Relieving factors: Medication, a little exercise, heating pad   OBJECTIVE: (objective measures completed at initial evaluation unless otherwise dated) PATIENT SURVEYS:  Modified Oswestry 60% disability  07/08/22: 27/50, 54% 08/07/2022: 28/50, 56%          SENSATION: Patient report sensation deficit of left  thight   MUSCLE LENGTH: Flexibility deficits noted bilateral hamstring (left worse than right) and hip musculature   POSTURE:             Increased lumbar lordosis   PALPATION: Global tenderness to palpation of lumbar and left gluteal region   LUMBAR ROM:    AROM eval 06/26/22 08/04/22  Flexion 25% - fingertips to knees 50% fingertips proximal shin Nearly reaches toes   Extension 50%    Right lateral flexion 75%  75%  Left lateral flexion 50%  75%  Right rotation 50%  50%  Left rotation 25%  75%   (Blank rows = not tested)   Patient reports low back pain and left hip/posterior thigh pain with all lumbar movement   LOWER EXTREMITY MMT:     MMT Right eval Left eval Rt / Lt 07/13/2022 Rt/LT 08/04/22  Hip flexion 4 4- 4 / 4- 4/4  Hip extension 3 3 3  / 3 3 /3+  Hip abduction 3 3 3  / 3 4-/4-  Hip adduction        Hip internal rotation        Hip external rotation        Knee flexion 5 4+    Knee extension 5 5    Ankle dorsiflexion 5 4+    Ankle plantarflexion        Ankle inversion        Ankle eversion 5 4+     (Blank rows = not tested)   LUMBAR SPECIAL TESTS:  Slump positive on left   FUNCTIONAL TESTS:  Unable to perform DLLT  06/29/22: 5 x STS : 26 sec without UE - standard Mat  08/04/22: 21.4  sec with out UE- standard Mat    GAIT: Assistive device utilized: Single point cane Level of assistance: Modified independence Comments: Slightly antalgic on the left     TODAY'S TREATMENT:   [facility] Adult PT Treatment:                                                DATE: 08/10/22 Therapeutic Exercise: Nustep L5 x 5 minutes  Standing hip extension 2 x 10 each with forearms on counter  Standing hip abduction 2 x 10 each  Bilat heel raises  Gastroc stretch  STS 10# 10 x 2  Lumbar flexion seated  Bridge 10 x 2  SLR with ab brace Hamstring stretch with strap supine Side hip abduction 10 x 2  LTR Piriformis stretches x 2 each     [facility] Adult PT Treatment:  DATE: 08/07/22 Therapeutic Exercise: NuStep L6 x 6 min with UE/LE while taking subjective Seated lumbar flexion physioball (black) roll-out 10 x 5 sec Seated hamstring stretch 2 x 20 sec  LTR x 10 Piriformis stretch 2 x 20 sec each Bridge partial range 2 x 10 SLR with abdominal engagement 2 x 10 each Sidelying hip abduction 2 x 10 each Sit to stand 2 x 10 Standing hip extension 2 x 10 each with forearms on counter    [facility] Adult PT Treatment:                                                DATE: 08/04/22 Therapeutic Exercise: Hip abduction x 10 each Side clam x 10 each  Prone alternating hip extension Nustep L6 x 5 minutes  Standing heel raises Standing hip abduction x 10 each  Standing hip extension x 10 each , one set with forearms on counter x 10 each   [facility] Adult PT Treatment:                                                DATE: 07/15/22 Therapeutic Exercise: Nustep L6 x 5 min with UE/LE while taking subjective Seated hamstring stretch 2 x 30 sec  Seated lumbar flexion physioball roll-out 5 x 10 sec LTR x 10 Wind shield wipers x 10 Piriformis stretch 2 x 20 sec each Bridge partial range 2 x 10 SLR with abdominal engagement 2 x 10 each Hooklying clamshell with blue 2 x 10 Sidelying hip abduction 2 x 10 each Sit to stand 2 x 10 Seated physioball press abdominal set fwd and lateral 10 x 5 sec each  PATIENT EDUCATION:  Education details: HEP Person educated: Patient Education method: Programmer, multimedia, Demonstration, Actor cues, Verbal cues Education comprehension: verbalized understanding, returned demonstration, verbal cues required, tactile cues required, and needs further education   HOME EXERCISE PROGRAM: Access Code: [email or web address]     ASSESSMENT: CLINICAL IMPRESSION: Patient tolerated therapy well with no adverse effects. She reports ongoing compliance with HEP. Continued with open and closed chain hip and core strengthening without  complaints of increased pain. Performed hamstring and calf stretches to address posterior leg pain. Pt reports a good stretch with these exercises.  No changes were made to HEP this visit. Patient would benefit from continued skilled PT to progress her mobility and strength in order to reduce pain and maximize functional ability.     OBJECTIVE IMPAIRMENTS: Abnormal gait, decreased activity tolerance, difficulty walking, decreased ROM, decreased strength, impaired flexibility, postural dysfunction, and pain.    ACTIVITY LIMITATIONS: carrying, lifting, bending, sitting, standing, squatting, sleeping, transfers, bed mobility, bathing, hygiene/grooming, and locomotion level   PARTICIPATION LIMITATIONS: meal prep, cleaning, laundry, driving, shopping, community activity, and occupation   PERSONAL FACTORS: Fitness, Past/current experiences, Time since onset of injury/illness/exacerbation, and 1 comorbidity: see PMH above  are also affecting patient's functional outcome.      GOALS: Goals reviewed with patient? Yes   SHORT TERM GOALS: Target date: 07/03/2022   Patient will be I with initial HEP in order to progress with therapy. Baseline: HEP provided at eval 06/29/22: compliance and independent  Goal status: MET   2.  Patient will report </= 7/10 pain in order  to reduce functional limitations Baseline: 9/10 06/29/22: 8/10  07/08/22: 9/10 08/04/22: never lower than 7/10, 10/10 today.  Goal status: ONGOING   LONG TERM GOALS: Target date: 09/15/2022   Patient will be I with final HEP to maintain progress from PT. Baseline: HEP provided at eval 08/07/2022: progressing Goal status: ONGOING   2.  Patient will report Modified Oswestry </= 40% in order to indicate improved functional ability and reduced pain Baseline: 60% 07/08/22: 54% 08/07/2022: 56%  Goal status: ONGOING   3.  Patient will demonstrate a 25% improvement in lumbar AROM in order to improve dressing and self care ability Baseline:  see lumbar AROM limitations noted above 08/04/22: see chart Goal status: Partially met    4.  Patient will exhibit improved hip strength >/= 4-/5 MMT in order to improve standing and walking tolerance Baseline: 3/5 MMT 08/07/2022: seated above Goal status:  partially met      PLAN: PT FREQUENCY: 1-2x/week   PT DURATION: 8 weeks   PLANNED INTERVENTIONS: Therapeutic exercises, Therapeutic activity, Neuromuscular re-education, Balance training, Gait training, Patient/Family education, Self Care, Joint mobilization, Aquatic Therapy, Dry Needling, Electrical stimulation, Cryotherapy, Moist heat, Manual therapy, and Re-evaluation.   PLAN FOR NEXT SESSION: Review HEP and progress PRN, manual/modalities for lumbar pain relief, progress lumbar and hip stretching and progress core stabilization and hip strengthening as tolerated     Jannette Spanner, PTA 08/10/22 11:23 AM Phone: [phone] Fax: [phone]

## 2022-08-11 NOTE — Therapy (Signed)
OUTPATIENT PHYSICAL THERAPY TREATMENT NOTE   Patient Name: [name] MRN: [id number] DOB:1970/11/24, 52 y.o., female 51 Date: 08/12/2022  PCP: Andi Devon, MD REFERRING PROVIDER: Erick Colace, MD   END OF SESSION:   PT End of Session - 08/12/22 1113     Visit Number 12    Number of Visits 17    Date for PT Re-Evaluation 09/15/22    Authorization Type [facility]    Authorization - Number of Visits 27    PT Start Time 1105    PT Stop Time 1145    PT Time Calculation (min) 40 min    Activity Tolerance Patient tolerated treatment well    Behavior During Therapy WFL for tasks assessed/performed                   Past Medical History:  Diagnosis Date   Anemia    hx   Arthritis    Complication of anesthesia    slow to wake up,   very difficult to get iv had picc line last time   Difficult intravenous access    hands are usually best for blood draws and IV starts   DVT (deep venous thrombosis) (HCC)    ? vs lupus   Fibroids    Headache(784.0)    Neck pain    Scoliosis    Past Surgical History:  Procedure Laterality Date   ABDOMINAL HYSTERECTOMY  06/29/11   Robotic Assisted Hysterectomy   CERVICAL SPINE SURGERY  10   CESAREAN SECTION  1996   LUMBAR LAMINECTOMY  08/31/2011   Procedure: MICRODISCECTOMY LUMBAR LAMINECTOMY;  Surgeon: Jacki Cones, MD;  Location: [facility];  Service: Orthopedics;  Laterality: N/A;  lumbar five sacral one central microdisectomy   MYOMECTOMY  13   NECK SURGERY     POSTERIOR CERVICAL FUSION/FORAMINOTOMY N/A 02/02/2013   Procedure: POSTERIOR C5-6 SPINAL FUSION;  Surgeon: Venita Lick, MD;  Location: [facility];  Service: Orthopedics;  Laterality: N/A;   POSTERIOR FUSION CERVICAL SPINE  02/02/2013   C 5 C6     Dr Shon Baton   WISDOM TOOTH EXTRACTION     Patient Active Problem List   Diagnosis Date Noted   Greater trochanteric bursitis of right hip 02/12/2015   Metabolic syndrome 08/27/2014   Other constipation 05/24/2014    Greater trochanteric bursitis of left hip 09/08/2013   UTI (urinary tract infection) 09/03/2011   Hypokalemia 09/03/2011   Cauda equina syndrome (HCC) 09/03/2011   Spinal stenosis of lumbar region 08/31/2011   Lumbar herniated disc 08/31/2011   Abdominal pain, acute, generalized 08/28/2011   Nausea and vomiting 08/28/2011   Dehydration 08/28/2011   S/P hysterectomy 4/13 08/28/2011   Menometrorrhagia 05/21/2011   DVT of right leg (deep venous thrombosis) in 01/2011 05/21/2011   Fibroid uterus 03/20/2011   LBP (low back pain) 05/29/2010   Avitaminosis D 04/23/2010   Benign essential HTN 04/21/2010   Essential (primary) hypertension 04/21/2010   Allergic rhinitis 11/01/2009   Absolute anemia 11/01/2009   Headache, migraine 11/01/2009   Adiposity 11/01/2009    REFERRING DIAG: Lumbar radiculitis; Lumbar post-laminectomy syndrome  THERAPY DIAG:  Other low back pain  Pain in left leg  Muscle weakness (generalized)  Rationale for Evaluation and Treatment Rehabilitation  PERTINENT HISTORY: L5-S1 decompression microdiscectomy and laminectomy 2013; Posterior C5-C6 fusion 2014  PRECAUTIONS: None   SUBJECTIVE:  SUBJECTIVE STATEMENT: Patient reports she is doing good. Still feeling about the same.  PAIN:  Are you having pain? Yes:  NPRS scale: 8/10 (10/10 at worse) Pain location: Low back, left hip and back of leg Pain description: Aching and stabbing, shooting, numbness, tingling Aggravating factors: Sitting or walking and bending Relieving factors: Medication, a little exercise, heating pad   OBJECTIVE: (objective measures completed at initial evaluation unless otherwise dated) PATIENT SURVEYS:  Modified Oswestry 60% disability  07/08/22: 27/50, 54% 08/07/2022: 28/50, 56%           SENSATION: Patient report sensation deficit of left thight   MUSCLE LENGTH: Flexibility deficits noted bilateral hamstring (left worse than right) and hip musculature   POSTURE:             Increased lumbar lordosis   PALPATION: Global tenderness to palpation of lumbar and left gluteal region   LUMBAR ROM:    AROM eval 06/26/22 08/04/22  Flexion 25% - fingertips to knees 50% fingertips proximal shin Nearly reaches toes   Extension 50%    Right lateral flexion 75%  75%  Left lateral flexion 50%  75%  Right rotation 50%  50%  Left rotation 25%  75%   (Blank rows = not tested)   Patient reports low back pain and left hip/posterior thigh pain with all lumbar movement   LOWER EXTREMITY MMT:     MMT Right eval Left eval Rt / Lt 07/13/2022 Rt/LT 08/04/22  Hip flexion 4 4- 4 / 4- 4/4  Hip extension 3 3 3  / 3 3 /3+  Hip abduction 3 3 3  / 3 4-/4-  Hip adduction        Hip internal rotation        Hip external rotation        Knee flexion 5 4+    Knee extension 5 5    Ankle dorsiflexion 5 4+    Ankle plantarflexion        Ankle inversion        Ankle eversion 5 4+     (Blank rows = not tested)   LUMBAR SPECIAL TESTS:  Slump positive on left   FUNCTIONAL TESTS:  Unable to perform DLLT  06/29/22: 5 x STS : 26 sec without UE - standard Mat  08/04/22: 21.4  sec with out UE- standard Mat    GAIT: Assistive device utilized: Single point cane Level of assistance: Modified independence Comments: Slightly antalgic on the left     TODAY'S TREATMENT:   [facility] Adult PT Treatment:                                                DATE: 08/12/22 Therapeutic Exercise: Nustep L5 x 5 min with UE/LE while taking subjective Seated lumbar flexion physioball (black) roll-out 10 x 5 sec Seated hamstring stretch 2 x 20 sec  Seated abdominal set with physioball press 10 x 5 sec Bridge 2 x 10 SLR with abdominal engagement 2 x 10 each Sit to stand with 10# 2 x 10 Standing hip extension 2 x 10  each with forearms on counter  Standing hip abduction 2 x 10 each  Forward 6" step-up x 10 each Standing shoulder extension with yellow with abdominal engagement x 10   [facility] Adult PT Treatment:  DATE: 08/10/22 Therapeutic Exercise: Nustep L5 x 5 minutes  Standing hip extension 2 x 10 each with forearms on counter  Standing hip abduction 2 x 10 each  Bilat heel raises  Gastroc stretch  STS 10# 10 x 2  Lumbar flexion seated  Bridge 10 x 2  SLR with ab brace Hamstring stretch with strap supine Side hip abduction 10 x 2  LTR Piriformis stretches x 2 each   [facility] Adult PT Treatment:                                                DATE: 08/07/22 Therapeutic Exercise: NuStep L6 x 6 min with UE/LE while taking subjective Seated lumbar flexion physioball (black) roll-out 10 x 5 sec Seated hamstring stretch 2 x 20 sec  LTR x 10 Piriformis stretch 2 x 20 sec each Bridge partial range 2 x 10 SLR with abdominal engagement 2 x 10 each Sidelying hip abduction 2 x 10 each Sit to stand 2 x 10 Standing hip extension 2 x 10 each with forearms on counter   [facility] Adult PT Treatment:                                                DATE: 08/04/22 Therapeutic Exercise: Hip abduction x 10 each Side clam x 10 each  Prone alternating hip extension Nustep L6 x 5 minutes  Standing heel raises Standing hip abduction x 10 each  Standing hip extension x 10 each , one set with forearms on counter x 10 each   PATIENT EDUCATION:  Education details: HEP Person educated: Patient Education method: Programmer, multimedia, Demonstration, Actor cues, Verbal cues Education comprehension: verbalized understanding, returned demonstration, verbal cues required, tactile cues required, and needs further education   HOME EXERCISE PROGRAM: Access Code: [email or web address]     ASSESSMENT: CLINICAL IMPRESSION: Patient tolerated therapy well with no adverse effects. Therapy continued focus  primarily on progress core, hip, and LE strengthening with good tolerance. Continued with progression of standing strengthening exercises and incorporated step-ups. She did report her legs felt like they were spasming but denied any increase in low back pain with exercises. No changes made to HEP this visit. Patient would benefit from continued skilled PT to progress her mobility and strength in order to reduce pain and maximize functional ability.     OBJECTIVE IMPAIRMENTS: Abnormal gait, decreased activity tolerance, difficulty walking, decreased ROM, decreased strength, impaired flexibility, postural dysfunction, and pain.    ACTIVITY LIMITATIONS: carrying, lifting, bending, sitting, standing, squatting, sleeping, transfers, bed mobility, bathing, hygiene/grooming, and locomotion level   PARTICIPATION LIMITATIONS: meal prep, cleaning, laundry, driving, shopping, community activity, and occupation   PERSONAL FACTORS: Fitness, Past/current experiences, Time since onset of injury/illness/exacerbation, and 1 comorbidity: see PMH above  are also affecting patient's functional outcome.      GOALS: Goals reviewed with patient? Yes   SHORT TERM GOALS: Target date: 07/03/2022   Patient will be I with initial HEP in order to progress with therapy. Baseline: HEP provided at eval 06/29/22: compliance and independent  Goal status: MET   2.  Patient will report </= 7/10 pain in order to reduce functional limitations Baseline: 9/10 06/29/22: 8/10  07/08/22: 9/10 08/04/22: never lower than 7/10,  10/10 today.  Goal status: ONGOING   LONG TERM GOALS: Target date: 09/15/2022   Patient will be I with final HEP to maintain progress from PT. Baseline: HEP provided at eval 08/07/2022: progressing Goal status: ONGOING   2.  Patient will report Modified Oswestry </= 40% in order to indicate improved functional ability and reduced pain Baseline: 60% 07/08/22: 54% 08/07/2022: 56%  Goal status: ONGOING   3.   Patient will demonstrate a 25% improvement in lumbar AROM in order to improve dressing and self care ability Baseline: see lumbar AROM limitations noted above 08/04/22: see chart Goal status: Partially met    4.  Patient will exhibit improved hip strength >/= 4-/5 MMT in order to improve standing and walking tolerance Baseline: 3/5 MMT 08/07/2022: seated above Goal status:  partially met      PLAN: PT FREQUENCY: 1-2x/week   PT DURATION: 8 weeks   PLANNED INTERVENTIONS: Therapeutic exercises, Therapeutic activity, Neuromuscular re-education, Balance training, Gait training, Patient/Family education, Self Care, Joint mobilization, Aquatic Therapy, Dry Needling, Electrical stimulation, Cryotherapy, Moist heat, Manual therapy, and Re-evaluation.   PLAN FOR NEXT SESSION: Review HEP and progress PRN, manual/modalities for lumbar pain relief, progress lumbar and hip stretching and progress core stabilization and hip strengthening as tolerated     Rosana Hoes, PT, DPT, LAT, ATC 08/12/22  11:47 AM Phone: [phone] Fax: [phone]

## 2022-08-12 ENCOUNTER — Ambulatory Visit: Payer: Medicaid Other | Admitting: Physical Therapy

## 2022-08-12 ENCOUNTER — Other Ambulatory Visit: Payer: Self-pay

## 2022-08-12 ENCOUNTER — Encounter: Payer: Self-pay | Admitting: Physical Therapy

## 2022-08-12 DIAGNOSIS — M6281 Muscle weakness (generalized): Secondary | ICD-10-CM

## 2022-08-12 DIAGNOSIS — M5459 Other low back pain: Secondary | ICD-10-CM | POA: Diagnosis not present

## 2022-08-12 DIAGNOSIS — M79605 Pain in left leg: Secondary | ICD-10-CM

## 2022-08-19 ENCOUNTER — Other Ambulatory Visit: Payer: Self-pay | Admitting: Registered Nurse

## 2022-09-01 ENCOUNTER — Encounter: Payer: Medicaid Other | Admitting: Physical Medicine & Rehabilitation

## 2022-09-02 ENCOUNTER — Encounter: Payer: Self-pay | Admitting: Physical Medicine & Rehabilitation

## 2022-09-02 ENCOUNTER — Other Ambulatory Visit: Payer: Self-pay

## 2022-09-02 ENCOUNTER — Encounter: Payer: Self-pay | Admitting: Physical Therapy

## 2022-09-02 ENCOUNTER — Ambulatory Visit: Payer: Medicaid Other | Attending: Physical Medicine & Rehabilitation | Admitting: Physical Therapy

## 2022-09-02 DIAGNOSIS — M5459 Other low back pain: Secondary | ICD-10-CM | POA: Insufficient documentation

## 2022-09-02 DIAGNOSIS — M79605 Pain in left leg: Secondary | ICD-10-CM | POA: Diagnosis present

## 2022-09-02 DIAGNOSIS — M6281 Muscle weakness (generalized): Secondary | ICD-10-CM | POA: Insufficient documentation

## 2022-09-02 NOTE — Patient Instructions (Signed)
Access Code: [email or web address] URL: [email or web address] Date: 09/02/2022 Prepared by: Rosana Hoes  Exercises - Supine Posterior Pelvic Tilt  - 2 x daily - 10 reps - a few seconds hold - Supine Lower Trunk Rotation  - 2 x daily - 10 reps - a few seconds hold - Supine Hip Internal and External Rotation  - 2 x daily - 10 reps - a few seconds hold - Supine Piriformis Stretch with Foot on Ground  - 2 x daily - 3 reps - 30 second hold - Hooklying Clamshell with Resistance  - 1 x daily - 2 sets - 10 reps - 5 hold - Supine Bridge  - 1 x daily - 2 sets - 10 reps - Straight Leg Raise  - 1 x daily - 2 sets - 10 reps - Sit to Stand Without Arm Support  - 1 x daily - 2 sets - 10 reps - Sidelying Hip Abduction  - 1 x daily - 7 x weekly - 1-2 sets - 10 reps - 3 hold - Prone Hip Extension on Table  - 1 x daily - 7 x weekly - 2 sets - 15 reps - 3 hold - Standing Hip Abduction with Resistance at Thighs  - 1 x daily - 2 sets - 15 reps

## 2022-09-02 NOTE — Therapy (Signed)
OUTPATIENT PHYSICAL THERAPY TREATMENT NOTE   Patient Name: [name] MRN: [id number] DOB:1971/03/18, 52 y.o., female Today's Date: 09/02/2022  PCP: Andi Devon, MD REFERRING PROVIDER: Erick Colace, MD   END OF SESSION:   PT End of Session - 09/02/22 1223     Visit Number 13    Number of Visits 17    Date for PT Re-Evaluation 09/15/22    Authorization Type [employer]    Authorization - Number of Visits 27    PT Start Time 1146    PT Stop Time 1228    PT Time Calculation (min) 42 min    Activity Tolerance Patient tolerated treatment well    Behavior During Therapy WFL for tasks assessed/performed                    Past Medical History:  Diagnosis Date   Anemia    hx   Arthritis    Complication of anesthesia    slow to wake up,   very difficult to get iv had picc line last time   Difficult intravenous access    hands are usually best for blood draws and IV starts   DVT (deep venous thrombosis) (HCC)    ? vs lupus   Fibroids    Headache(784.0)    Neck pain    Scoliosis    Past Surgical History:  Procedure Laterality Date   ABDOMINAL HYSTERECTOMY  06/29/11   Robotic Assisted Hysterectomy   CERVICAL SPINE SURGERY  10   CESAREAN SECTION  1996   LUMBAR LAMINECTOMY  08/31/2011   Procedure: MICRODISCECTOMY LUMBAR LAMINECTOMY;  Surgeon: Jacki Cones, MD;  Location: [facility];  Service: Orthopedics;  Laterality: N/A;  lumbar five sacral one central microdisectomy   MYOMECTOMY  13   NECK SURGERY     POSTERIOR CERVICAL FUSION/FORAMINOTOMY N/A 02/02/2013   Procedure: POSTERIOR C5-6 SPINAL FUSION;  Surgeon: Venita Lick, MD;  Location: [facility];  Service: Orthopedics;  Laterality: N/A;   POSTERIOR FUSION CERVICAL SPINE  02/02/2013   C 5 C6     Dr Shon Baton   WISDOM TOOTH EXTRACTION     Patient Active Problem List   Diagnosis Date Noted   Greater trochanteric bursitis of right hip 02/12/2015   Metabolic syndrome 08/27/2014   Other constipation  05/24/2014   Greater trochanteric bursitis of left hip 09/08/2013   UTI (urinary tract infection) 09/03/2011   Hypokalemia 09/03/2011   Cauda equina syndrome (HCC) 09/03/2011   Spinal stenosis of lumbar region 08/31/2011   Lumbar herniated disc 08/31/2011   Abdominal pain, acute, generalized 08/28/2011   Nausea and vomiting 08/28/2011   Dehydration 08/28/2011   S/P hysterectomy 4/13 08/28/2011   Menometrorrhagia 05/21/2011   DVT of right leg (deep venous thrombosis) in 01/2011 05/21/2011   Fibroid uterus 03/20/2011   LBP (low back pain) 05/29/2010   Avitaminosis D 04/23/2010   Benign essential HTN 04/21/2010   Essential (primary) hypertension 04/21/2010   Allergic rhinitis 11/01/2009   Absolute anemia 11/01/2009   Headache, migraine 11/01/2009   Adiposity 11/01/2009    REFERRING DIAG: Lumbar radiculitis; Lumbar post-laminectomy syndrome  THERAPY DIAG:  Other low back pain  Pain in left leg  Muscle weakness (generalized)  Rationale for Evaluation and Treatment Rehabilitation  PERTINENT HISTORY: L5-S1 decompression microdiscectomy and laminectomy 2013; Posterior C5-C6 fusion 2014  PRECAUTIONS: None   SUBJECTIVE:  SUBJECTIVE STATEMENT: Patient reports she is doing alright. Still having the left leg numbness.   PAIN:  Are you having pain? Yes:  NPRS scale: 7/10 (10/10 at worse) Pain location: Low back, left hip and back of leg Pain description: Aching and stabbing, shooting, numbness, tingling Aggravating factors: Sitting or walking and bending Relieving factors: Medication, a little exercise, heating pad   OBJECTIVE: (objective measures completed at initial evaluation unless otherwise dated) PATIENT SURVEYS:  Modified Oswestry 60% disability  07/08/22: 27/50, 54% 08/07/2022: 28/50, 56%           SENSATION: Patient report sensation deficit of left thight   MUSCLE LENGTH: Flexibility deficits noted bilateral hamstring (left worse than right) and hip musculature   POSTURE:             Increased lumbar lordosis   PALPATION: Global tenderness to palpation of lumbar and left gluteal region   LUMBAR ROM:    AROM eval 06/26/22 08/04/22  Flexion 25% - fingertips to knees 50% fingertips proximal shin Nearly reaches toes   Extension 50%    Right lateral flexion 75%  75%  Left lateral flexion 50%  75%  Right rotation 50%  50%  Left rotation 25%  75%   (Blank rows = not tested)   Patient reports low back pain and left hip/posterior thigh pain with all lumbar movement   LOWER EXTREMITY MMT:     MMT Right eval Left eval Rt / Lt 07/13/2022 Rt/LT 08/04/22  Hip flexion 4 4- 4 / 4- 4/4  Hip extension 3 3 3  / 3 3 /3+  Hip abduction 3 3 3  / 3 4-/4-  Hip adduction        Hip internal rotation        Hip external rotation        Knee flexion 5 4+    Knee extension 5 5    Ankle dorsiflexion 5 4+    Ankle plantarflexion        Ankle inversion        Ankle eversion 5 4+     (Blank rows = not tested)   LUMBAR SPECIAL TESTS:  Slump positive on left   FUNCTIONAL TESTS:  Unable to perform DLLT  06/29/22: 5 x STS : 26 sec without UE - standard Mat  08/04/22: 21.4  sec with out UE- standard Mat  09/02/2022: 17 sec with out UE- standard mat    GAIT: Assistive device utilized: Single point cane Level of assistance: Modified independence Comments: Slightly antalgic on the left     TODAY'S TREATMENT:   [facility] Adult PT Treatment:                                                DATE: 09/02/22 Therapeutic Exercise: Nustep L6 x 5 min with UE/LE while taking subjective LTR x 10 Hooklying clamshell with blue 2 x 15 Bridge 2 x 10 SLR with abdominal engagement 2 x 10 each Sit to stand with 10# 2 x 10 Standing hip extension with red 2 x 10 each with forearms on counter  Standing hip  abduction with red 2 x 15 each  Seated lumbar flexion physioball (red) roll-out 10 x 5 sec Seated abdominal set with physioball press 10 x 5 sec fwd and lateral   [facility] Adult PT Treatment:  DATE: 08/12/22 Therapeutic Exercise: Nustep L5 x 5 min with UE/LE while taking subjective Seated lumbar flexion physioball (black) roll-out 10 x 5 sec Seated hamstring stretch 2 x 20 sec  Seated abdominal set with physioball press 10 x 5 sec Bridge 2 x 10 SLR with abdominal engagement 2 x 10 each Sit to stand with 10# 2 x 10 Standing hip extension 2 x 10 each with forearms on counter  Standing hip abduction 2 x 10 each  Forward 6" step-up x 10 each Standing shoulder extension with yellow with abdominal engagement x 10  [facility] Adult PT Treatment:                                                DATE: 08/10/22 Therapeutic Exercise: Nustep L5 x 5 minutes  Standing hip extension 2 x 10 each with forearms on counter  Standing hip abduction 2 x 10 each  Bilat heel raises  Gastroc stretch  STS 10# 10 x 2  Lumbar flexion seated  Bridge 10 x 2  SLR with ab brace Hamstring stretch with strap supine Side hip abduction 10 x 2  LTR Piriformis stretches x 2 each   PATIENT EDUCATION:  Education details: HEP Person educated: Patient Education method: Programmer, multimedia, Demonstration, Actor cues, Verbal cues Education comprehension: verbalized understanding, returned demonstration, verbal cues required, tactile cues required, and needs further education   HOME EXERCISE PROGRAM: Access Code: [email or web address]     ASSESSMENT: CLINICAL IMPRESSION: Patient tolerated therapy well with no adverse effects. She did exhibit improvement in her 5xSTS this visit indicating improvement in LE strength. Therapy focused primarily on progressing core and hip strengthening with good tolerance. She is tolerating greater resistance with exercises and updated her HEP to added banded resistance  to standing hip strengthening exercises. She did not report any increase in pain with therapy but did exhibit muscular fatigue greater on the left with hip strengthening. Patient would benefit from continued skilled PT to progress her mobility and strength in order to reduce pain and maximize functional ability.     OBJECTIVE IMPAIRMENTS: Abnormal gait, decreased activity tolerance, difficulty walking, decreased ROM, decreased strength, impaired flexibility, postural dysfunction, and pain.    ACTIVITY LIMITATIONS: carrying, lifting, bending, sitting, standing, squatting, sleeping, transfers, bed mobility, bathing, hygiene/grooming, and locomotion level   PARTICIPATION LIMITATIONS: meal prep, cleaning, laundry, driving, shopping, community activity, and occupation   PERSONAL FACTORS: Fitness, Past/current experiences, Time since onset of injury/illness/exacerbation, and 1 comorbidity: see PMH above  are also affecting patient's functional outcome.      GOALS: Goals reviewed with patient? Yes   SHORT TERM GOALS: Target date: 07/03/2022   Patient will be I with initial HEP in order to progress with therapy. Baseline: HEP provided at eval 06/29/22: compliance and independent  Goal status: MET   2.  Patient will report </= 7/10 pain in order to reduce functional limitations Baseline: 9/10 06/29/22: 8/10  07/08/22: 9/10 08/04/22: never lower than 7/10, 10/10 today.  Goal status: ONGOING   LONG TERM GOALS: Target date: 09/15/2022   Patient will be I with final HEP to maintain progress from PT. Baseline: HEP provided at eval 08/07/2022: progressing Goal status: ONGOING   2.  Patient will report Modified Oswestry </= 40% in order to indicate improved functional ability and reduced pain Baseline: 60% 07/08/22: 54% 08/07/2022: 56%  Goal status: ONGOING  3.  Patient will demonstrate a 25% improvement in lumbar AROM in order to improve dressing and self care ability Baseline: see lumbar AROM  limitations noted above 08/04/22: see chart Goal status: Partially met    4.  Patient will exhibit improved hip strength >/= 4-/5 MMT in order to improve standing and walking tolerance Baseline: 3/5 MMT 08/07/2022: seated above Goal status:  partially met      PLAN: PT FREQUENCY: 1-2x/week   PT DURATION: 8 weeks   PLANNED INTERVENTIONS: Therapeutic exercises, Therapeutic activity, Neuromuscular re-education, Balance training, Gait training, Patient/Family education, Self Care, Joint mobilization, Aquatic Therapy, Dry Needling, Electrical stimulation, Cryotherapy, Moist heat, Manual therapy, and Re-evaluation.   PLAN FOR NEXT SESSION: Review HEP and progress PRN, manual/modalities for lumbar pain relief, progress lumbar and hip stretching and progress core stabilization and hip strengthening as tolerated     Rosana Hoes, PT, DPT, LAT, ATC 09/02/22  12:36 PM Phone: [phone] Fax: [phone]

## 2022-09-04 ENCOUNTER — Encounter: Payer: Medicaid Other | Admitting: Physical Therapy

## 2022-09-04 ENCOUNTER — Telehealth: Payer: Self-pay | Admitting: Physical Therapy

## 2022-09-04 NOTE — Telephone Encounter (Signed)
Contacted patient regarding no-show to appointment. Left message regarding next appointment date and time.

## 2022-09-07 ENCOUNTER — Encounter: Payer: Self-pay | Admitting: Physical Therapy

## 2022-09-07 ENCOUNTER — Other Ambulatory Visit: Payer: Self-pay

## 2022-09-07 ENCOUNTER — Ambulatory Visit: Payer: Medicaid Other | Admitting: Physical Therapy

## 2022-09-07 DIAGNOSIS — M5459 Other low back pain: Secondary | ICD-10-CM | POA: Diagnosis not present

## 2022-09-07 DIAGNOSIS — M6281 Muscle weakness (generalized): Secondary | ICD-10-CM

## 2022-09-07 DIAGNOSIS — M79605 Pain in left leg: Secondary | ICD-10-CM

## 2022-09-07 NOTE — Therapy (Signed)
OUTPATIENT PHYSICAL THERAPY TREATMENT NOTE   Patient Name: [name] MRN: [id number] DOB:[birth year], 52 y.o., female Today's Date: 09/09/2022  PCP: Andi Devon, MD REFERRING PROVIDER: Erick Colace, MD   END OF SESSION:   PT End of Session - 09/09/22 1109     Visit Number 15    Number of Visits 17    Date for PT Re-Evaluation 09/15/22    Authorization Type [employer]    Authorization - Number of Visits 27    PT Start Time 1104    PT Stop Time 1145    PT Time Calculation (min) 41 min    Activity Tolerance Patient tolerated treatment well    Behavior During Therapy WFL for tasks assessed/performed                      Past Medical History:  Diagnosis Date   Anemia    hx   Arthritis    Complication of anesthesia    slow to wake up,   very difficult to get iv had picc line last time   Difficult intravenous access    hands are usually best for blood draws and IV starts   DVT (deep venous thrombosis) (HCC)    ? vs lupus   Fibroids    Headache(784.0)    Neck pain    Scoliosis    Past Surgical History:  Procedure Laterality Date   ABDOMINAL HYSTERECTOMY  06/29/11   Robotic Assisted Hysterectomy   CERVICAL SPINE SURGERY  10   CESAREAN SECTION  1996   LUMBAR LAMINECTOMY  08/31/2011   Procedure: MICRODISCECTOMY LUMBAR LAMINECTOMY;  Surgeon: Jacki Cones, MD;  Location: [facility];  Service: Orthopedics;  Laterality: N/A;  lumbar five sacral one central microdisectomy   MYOMECTOMY  13   NECK SURGERY     POSTERIOR CERVICAL FUSION/FORAMINOTOMY N/A 02/02/2013   Procedure: POSTERIOR C5-6 SPINAL FUSION;  Surgeon: Venita Lick, MD;  Location: [facility];  Service: Orthopedics;  Laterality: N/A;   POSTERIOR FUSION CERVICAL SPINE  02/02/2013   C 5 C6     Dr Shon Baton   WISDOM TOOTH EXTRACTION     Patient Active Problem List   Diagnosis Date Noted   Greater trochanteric bursitis of right hip 02/12/2015   Metabolic syndrome 08/27/2014   Other constipation  05/24/2014   Greater trochanteric bursitis of left hip 09/08/2013   UTI (urinary tract infection) 09/03/2011   Hypokalemia 09/03/2011   Cauda equina syndrome (HCC) 09/03/2011   Spinal stenosis of lumbar region 08/31/2011   Lumbar herniated disc 08/31/2011   Abdominal pain, acute, generalized 08/28/2011   Nausea and vomiting 08/28/2011   Dehydration 08/28/2011   S/P hysterectomy 4/13 08/28/2011   Menometrorrhagia 05/21/2011   DVT of right leg (deep venous thrombosis) in 01/2011 05/21/2011   Fibroid uterus 03/20/2011   LBP (low back pain) 05/29/2010   Avitaminosis D 04/23/2010   Benign essential HTN 04/21/2010   Essential (primary) hypertension 04/21/2010   Allergic rhinitis 11/01/2009   Absolute anemia 11/01/2009   Headache, migraine 11/01/2009   Adiposity 11/01/2009    REFERRING DIAG: Lumbar radiculitis; Lumbar post-laminectomy syndrome  THERAPY DIAG:  Other low back pain  Pain in left leg  Muscle weakness (generalized)  Rationale for Evaluation and Treatment Rehabilitation  PERTINENT HISTORY: L5-S1 decompression microdiscectomy and laminectomy 2013; Posterior C5-C6 fusion 2014  PRECAUTIONS: None   SUBJECTIVE:  SUBJECTIVE STATEMENT: Patient reports she is still getting the pulling in the back of the left leg.  PAIN:  Are you having pain? Yes:  NPRS scale: 8/10 (10/10 at worse) Pain location: Low back, left hip and back of leg Pain description: Aching and stabbing, shooting, numbness, tingling Aggravating factors: Sitting or walking and bending Relieving factors: Medication, a little exercise, heating pad   OBJECTIVE: (objective measures completed at initial evaluation unless otherwise dated) PATIENT SURVEYS:  Modified Oswestry 60% disability  07/08/22: 27/50, 54% 08/07/2022: 28/50,  56% 09/07/2022: 29/50, 58%          SENSATION: Patient report sensation deficit of left thight   MUSCLE LENGTH: Flexibility deficits noted bilateral hamstring (left worse than right) and hip musculature   POSTURE:             Increased lumbar lordosis   PALPATION: Global tenderness to palpation of lumbar and left gluteal region   LUMBAR ROM:    AROM eval 06/26/22 08/04/22  Flexion 25% - fingertips to knees 50% fingertips proximal shin Nearly reaches toes   Extension 50%    Right lateral flexion 75%  75%  Left lateral flexion 50%  75%  Right rotation 50%  50%  Left rotation 25%  75%   (Blank rows = not tested)   Patient reports low back pain and left hip/posterior thigh pain with all lumbar movement   LOWER EXTREMITY MMT:     MMT Right eval Left eval Rt / Lt 07/13/2022 Rt/LT 08/04/22 Rt / Lt 09/07/2022  Hip flexion 4 4- 4 / 4- 4/4 4 / 4-  Hip extension 3 3 3  / 3 3 /3+ 3+ / 3  Hip abduction 3 3 3  / 3 4-/4- 4- / 4-  Hip adduction         Hip internal rotation         Hip external rotation         Knee flexion 5 4+     Knee extension 5 5     Ankle dorsiflexion 5 4+     Ankle plantarflexion         Ankle inversion         Ankle eversion 5 4+      (Blank rows = not tested)   LUMBAR SPECIAL TESTS:  Slump positive on left   FUNCTIONAL TESTS:  Unable to perform DLLT  06/29/22: 5 x STS : 26 sec without UE - standard Mat  08/04/22: 21.4  sec with out UE- standard Mat  09/02/2022: 17 sec with out UE- standard mat    GAIT: Assistive device utilized: Single point cane Level of assistance: Modified independence Comments: Slightly antalgic on the left     TODAY'S TREATMENT:   [facility] Adult PT Treatment:                                                DATE: 09/09/22 Therapeutic Exercise: Nustep L6 x 5 min with UE/LE while taking subjective Seated hamstring stretch with foot on step stool 3 x 20 sec Supine hamstring stretch with strap for HEP demo x 20 sec each LTR x 10 Bridge  2 x 10 SLR with abdominal engagement 2 x 10 each Sidelying hip abduction 2 x 15 each Sit to stand 2 x 10 holding 10# at chest Springboard bar pull-down with yellow springs 10  x 3 sec Pallof press with yellow spring 10 x 3 sec each Forward 8" step-up x 10 each   [facility] Adult PT Treatment:                                                DATE: 09/07/22 Therapeutic Exercise: Nustep L6 x 5 min with UE/LE while taking subjective Hooklying clamshell with blue 2 x 20 Bridge 2 x 10 SLR with abdominal engagement 2 x 10 each Sidelying hip abduction 2 x 15 each Sit to stand 2 x 10 Standing hip extension with red 2 x 15 each with forearms on counter  Forward 8" step-up 2 x 10 each  [facility] Adult PT Treatment:                                                DATE: 09/02/22 Therapeutic Exercise: Nustep L6 x 5 min with UE/LE while taking subjective LTR x 10 Hooklying clamshell with blue 2 x 15 Bridge 2 x 10 SLR with abdominal engagement 2 x 10 each Sit to stand with 10# 2 x 10 Standing hip extension with red 2 x 10 each with forearms on counter  Standing hip abduction with red 2 x 15 each  Seated lumbar flexion physioball (red) roll-out 10 x 5 sec Seated abdominal set with physioball press 10 x 5 sec fwd and lateral  PATIENT EDUCATION:  Education details: HEP update Person educated: Patient Education method: Explanation, Demonstration, Tactile cues, Verbal cues, Handout Education comprehension: verbalized understanding, returned demonstration, verbal cues required, tactile cues required, and needs further education   HOME EXERCISE PROGRAM: Access Code: [email or web address]     ASSESSMENT: CLINICAL IMPRESSION: Patient tolerated therapy well with no adverse effects. Therapy focused on progressing flexibility and strengthening. She noted reduced pulling in her left hamstring following stretching. She does continue to exhibit greater difficulty with her strengthening on the left side. She tolerated progression in  core stabilization exercises. Updated HEP to include hamstring stretching with good tolerance. Will plan discharge at next visit.     OBJECTIVE IMPAIRMENTS: Abnormal gait, decreased activity tolerance, difficulty walking, decreased ROM, decreased strength, impaired flexibility, postural dysfunction, and pain.    ACTIVITY LIMITATIONS: carrying, lifting, bending, sitting, standing, squatting, sleeping, transfers, bed mobility, bathing, hygiene/grooming, and locomotion level   PARTICIPATION LIMITATIONS: meal prep, cleaning, laundry, driving, shopping, community activity, and occupation   PERSONAL FACTORS: Fitness, Past/current experiences, Time since onset of injury/illness/exacerbation, and 1 comorbidity: see PMH above  are also affecting patient's functional outcome.      GOALS: Goals reviewed with patient? Yes   SHORT TERM GOALS: Target date: 07/03/2022   Patient will be I with initial HEP in order to progress with therapy. Baseline: HEP provided at eval 06/29/22: compliance and independent  Goal status: MET   2.  Patient will report </= 7/10 pain in order to reduce functional limitations Baseline: 9/10 06/29/22: 8/10  07/08/22: 9/10 08/04/22: never lower than 7/10, 10/10 today.  09/07/2022: 8/10 Goal status: ONGOING   LONG TERM GOALS: Target date: 09/15/2022   Patient will be I with final HEP to maintain progress from PT. Baseline: HEP provided at eval 08/07/2022: progressing Goal status: ONGOING   2.  Patient will report Modified  Oswestry </= 40% in order to indicate improved functional ability and reduced pain Baseline: 60% 07/08/22: 54% 08/07/2022: 56%  09/07/2022: 58% Goal status: ONGOING   3.  Patient will demonstrate a 25% improvement in lumbar AROM in order to improve dressing and self care ability Baseline: see lumbar AROM limitations noted above 08/04/22: see chart Goal status: Partially met    4.  Patient will exhibit improved hip strength >/= 4-/5 MMT in order to  improve standing and walking tolerance Baseline: 3/5 MMT 08/07/2022: seated above 09/07/2022: see limitations above Goal status:  PARTIALLY MET     PLAN: PT FREQUENCY: 1-2x/week   PT DURATION: 8 weeks   PLANNED INTERVENTIONS: Therapeutic exercises, Therapeutic activity, Neuromuscular re-education, Balance training, Gait training, Patient/Family education, Self Care, Joint mobilization, Aquatic Therapy, Dry Needling, Electrical stimulation, Cryotherapy, Moist heat, Manual therapy, and Re-evaluation.   PLAN FOR NEXT SESSION: Review HEP and finalize, lumbar and hip stretching and progress core stabilization and hip strengthening as tolerated, plan discharge     Rosana Hoes, PT, DPT, LAT, ATC 09/09/22  11:46 AM Phone: [phone] Fax: [phone]

## 2022-09-07 NOTE — Therapy (Signed)
OUTPATIENT PHYSICAL THERAPY TREATMENT NOTE   Patient Name: [name] MRN: [id number] DOB:1971/01/15, 52 y.o., female Today's Date: 09/07/2022  PCP: Andi Devon, MD REFERRING PROVIDER: Erick Colace, MD   END OF SESSION:   PT End of Session - 09/07/22 1102     Visit Number 14    Number of Visits 17    Date for PT Re-Evaluation 09/15/22    Authorization Type [facility] [employer]    Authorization - Number of Visits 27    PT Start Time 1100    PT Stop Time 1140    PT Time Calculation (min) 40 min    Activity Tolerance Patient tolerated treatment well    Behavior During Therapy WFL for tasks assessed/performed                     Past Medical History:  Diagnosis Date   Anemia    hx   Arthritis    Complication of anesthesia    slow to wake up,   very difficult to get iv had picc line last time   Difficult intravenous access    hands are usually best for blood draws and IV starts   DVT (deep venous thrombosis) (HCC)    ? vs lupus   Fibroids    Headache(784.0)    Neck pain    Scoliosis    Past Surgical History:  Procedure Laterality Date   ABDOMINAL HYSTERECTOMY  06/29/11   Robotic Assisted Hysterectomy   CERVICAL SPINE SURGERY  10   CESAREAN SECTION  1996   LUMBAR LAMINECTOMY  08/31/2011   Procedure: MICRODISCECTOMY LUMBAR LAMINECTOMY;  Surgeon: Jacki Cones, MD;  Location: [facility];  Service: Orthopedics;  Laterality: N/A;  lumbar five sacral one central microdisectomy   MYOMECTOMY  13   NECK SURGERY     POSTERIOR CERVICAL FUSION/FORAMINOTOMY N/A 02/02/2013   Procedure: POSTERIOR C5-6 SPINAL FUSION;  Surgeon: Venita Lick, MD;  Location: [facility];  Service: Orthopedics;  Laterality: N/A;   POSTERIOR FUSION CERVICAL SPINE  02/02/2013   C 5 C6     Dr Shon Baton   WISDOM TOOTH EXTRACTION     Patient Active Problem List   Diagnosis Date Noted   Greater trochanteric bursitis of right hip 02/12/2015   Metabolic syndrome 08/27/2014   Other constipation  05/24/2014   Greater trochanteric bursitis of left hip 09/08/2013   UTI (urinary tract infection) 09/03/2011   Hypokalemia 09/03/2011   Cauda equina syndrome (HCC) 09/03/2011   Spinal stenosis of lumbar region 08/31/2011   Lumbar herniated disc 08/31/2011   Abdominal pain, acute, generalized 08/28/2011   Nausea and vomiting 08/28/2011   Dehydration 08/28/2011   S/P hysterectomy 4/13 08/28/2011   Menometrorrhagia 05/21/2011   DVT of right leg (deep venous thrombosis) in 01/2011 05/21/2011   Fibroid uterus 03/20/2011   LBP (low back pain) 05/29/2010   Avitaminosis D 04/23/2010   Benign essential HTN 04/21/2010   Essential (primary) hypertension 04/21/2010   Allergic rhinitis 11/01/2009   Absolute anemia 11/01/2009   Headache, migraine 11/01/2009   Adiposity 11/01/2009    REFERRING DIAG: Lumbar radiculitis; Lumbar post-laminectomy syndrome  THERAPY DIAG:  Other low back pain  Pain in left leg  Muscle weakness (generalized)  Rationale for Evaluation and Treatment Rehabilitation  PERTINENT HISTORY: L5-S1 decompression microdiscectomy and laminectomy 2013; Posterior C5-C6 fusion 2014  PRECAUTIONS: None   SUBJECTIVE:  SUBJECTIVE STATEMENT: Patient reports her legs are feeling wobbly and she has been having a lot of spasms in her legs this morning.   PAIN:  Are you having pain? Yes:  NPRS scale: 8/10 (10/10 at worse) Pain location: Low back, left hip and back of leg Pain description: Aching and stabbing, shooting, numbness, tingling Aggravating factors: Sitting or walking and bending Relieving factors: Medication, a little exercise, heating pad   OBJECTIVE: (objective measures completed at initial evaluation unless otherwise dated) PATIENT SURVEYS:  Modified Oswestry 60% disability  07/08/22:  27/50, 54% 08/07/2022: 28/50, 56% 09/07/2022: 29/50, 58%          SENSATION: Patient report sensation deficit of left thight   MUSCLE LENGTH: Flexibility deficits noted bilateral hamstring (left worse than right) and hip musculature   POSTURE:             Increased lumbar lordosis   PALPATION: Global tenderness to palpation of lumbar and left gluteal region   LUMBAR ROM:    AROM eval 06/26/22 08/04/22  Flexion 25% - fingertips to knees 50% fingertips proximal shin Nearly reaches toes   Extension 50%    Right lateral flexion 75%  75%  Left lateral flexion 50%  75%  Right rotation 50%  50%  Left rotation 25%  75%   (Blank rows = not tested)   Patient reports low back pain and left hip/posterior thigh pain with all lumbar movement   LOWER EXTREMITY MMT:     MMT Right eval Left eval Rt / Lt 07/13/2022 Rt/LT 08/04/22 Rt / Lt 09/07/2022  Hip flexion 4 4- 4 / 4- 4/4 4 / 4-  Hip extension 3 3 3  / 3 3 /3+ 3+ / 3  Hip abduction 3 3 3  / 3 4-/4- 4- / 4-  Hip adduction         Hip internal rotation         Hip external rotation         Knee flexion 5 4+     Knee extension 5 5     Ankle dorsiflexion 5 4+     Ankle plantarflexion         Ankle inversion         Ankle eversion 5 4+      (Blank rows = not tested)   LUMBAR SPECIAL TESTS:  Slump positive on left   FUNCTIONAL TESTS:  Unable to perform DLLT  06/29/22: 5 x STS : 26 sec without UE - standard Mat  08/04/22: 21.4  sec with out UE- standard Mat  09/02/2022: 17 sec with out UE- standard mat    GAIT: Assistive device utilized: Single point cane Level of assistance: Modified independence Comments: Slightly antalgic on the left     TODAY'S TREATMENT:   [facility] Adult PT Treatment:                                                DATE: 09/07/22 Therapeutic Exercise: Nustep L6 x 5 min with UE/LE while taking subjective Hooklying clamshell with blue 2 x 20 Bridge 2 x 10 SLR with abdominal engagement 2 x 10 each Sidelying hip  abduction 2 x 15 each Sit to stand 2 x 10 Standing hip extension with red 2 x 15 each with forearms on counter  Forward 8" step-up 2 x 10 each   [facility]  Adult PT Treatment:                                                DATE: 09/02/22 Therapeutic Exercise: Nustep L6 x 5 min with UE/LE while taking subjective LTR x 10 Hooklying clamshell with blue 2 x 15 Bridge 2 x 10 SLR with abdominal engagement 2 x 10 each Sit to stand with 10# 2 x 10 Standing hip extension with red 2 x 10 each with forearms on counter  Standing hip abduction with red 2 x 15 each  Seated lumbar flexion physioball (red) roll-out 10 x 5 sec Seated abdominal set with physioball press 10 x 5 sec fwd and lateral  [facility] Adult PT Treatment:                                                DATE: 08/12/22 Therapeutic Exercise: Nustep L5 x 5 min with UE/LE while taking subjective Seated lumbar flexion physioball (black) roll-out 10 x 5 sec Seated hamstring stretch 2 x 20 sec  Seated abdominal set with physioball press 10 x 5 sec Bridge 2 x 10 SLR with abdominal engagement 2 x 10 each Sit to stand with 10# 2 x 10 Standing hip extension 2 x 10 each with forearms on counter  Standing hip abduction 2 x 10 each  Forward 6" step-up x 10 each Standing shoulder extension with yellow with abdominal engagement x 10  PATIENT EDUCATION:  Education details: HEP Person educated: Patient Education method: Programmer, multimedia, Demonstration, Tactile cues, Verbal cues Education comprehension: verbalized understanding, returned demonstration, verbal cues required, tactile cues required, and needs further education   HOME EXERCISE PROGRAM: Access Code: [email or web address]     ASSESSMENT: CLINICAL IMPRESSION: Patient tolerated therapy well with no adverse effects. She reports continued limitation with her functional ability on modified ODI and gross hip strength deficit that is worse on the left. She reported increased left leg pain and spasm this visit but  was able to complete all exercises. Therapy focused on progressing core, hip, and LE strengthening. No changes were made to HEP this visit. She has two more scheduled PT appointments and will likely discharge after completing those appointments. Patient would benefit from continued skilled PT to progress her mobility and strength in order to reduce pain and maximize functional ability.     OBJECTIVE IMPAIRMENTS: Abnormal gait, decreased activity tolerance, difficulty walking, decreased ROM, decreased strength, impaired flexibility, postural dysfunction, and pain.    ACTIVITY LIMITATIONS: carrying, lifting, bending, sitting, standing, squatting, sleeping, transfers, bed mobility, bathing, hygiene/grooming, and locomotion level   PARTICIPATION LIMITATIONS: meal prep, cleaning, laundry, driving, shopping, community activity, and occupation   PERSONAL FACTORS: Fitness, Past/current experiences, Time since onset of injury/illness/exacerbation, and 1 comorbidity: see PMH above  are also affecting patient's functional outcome.      GOALS: Goals reviewed with patient? Yes   SHORT TERM GOALS: Target date: 07/03/2022   Patient will be I with initial HEP in order to progress with therapy. Baseline: HEP provided at eval 06/29/22: compliance and independent  Goal status: MET   2.  Patient will report </= 7/10 pain in order to reduce functional limitations Baseline: 9/10 06/29/22: 8/10  07/08/22: 9/10 08/04/22: never lower than 7/10, 10/10  today.  09/07/2022: 8/10 Goal status: ONGOING   LONG TERM GOALS: Target date: 09/15/2022   Patient will be I with final HEP to maintain progress from PT. Baseline: HEP provided at eval 08/07/2022: progressing Goal status: ONGOING   2.  Patient will report Modified Oswestry </= 40% in order to indicate improved functional ability and reduced pain Baseline: 60% 07/08/22: 54% 08/07/2022: 56%  09/07/2022: 58% Goal status: ONGOING   3.  Patient will demonstrate a 25%  improvement in lumbar AROM in order to improve dressing and self care ability Baseline: see lumbar AROM limitations noted above 08/04/22: see chart Goal status: Partially met    4.  Patient will exhibit improved hip strength >/= 4-/5 MMT in order to improve standing and walking tolerance Baseline: 3/5 MMT 08/07/2022: seated above 09/07/2022: see limitations above Goal status:  PARTIALLY MET     PLAN: PT FREQUENCY: 1-2x/week   PT DURATION: 8 weeks   PLANNED INTERVENTIONS: Therapeutic exercises, Therapeutic activity, Neuromuscular re-education, Balance training, Gait training, Patient/Family education, Self Care, Joint mobilization, Aquatic Therapy, Dry Needling, Electrical stimulation, Cryotherapy, Moist heat, Manual therapy, and Re-evaluation.   PLAN FOR NEXT SESSION: Review HEP and progress PRN, manual/modalities for lumbar pain relief, progress lumbar and hip stretching and progress core stabilization and hip strengthening as tolerated     Rosana Hoes, PT, DPT, LAT, ATC 09/07/22  11:47 AM Phone: [phone] Fax: [phone]

## 2022-09-09 ENCOUNTER — Other Ambulatory Visit: Payer: Self-pay

## 2022-09-09 ENCOUNTER — Encounter: Payer: Self-pay | Admitting: Physical Therapy

## 2022-09-09 ENCOUNTER — Ambulatory Visit: Payer: Medicaid Other | Admitting: Physical Therapy

## 2022-09-09 DIAGNOSIS — M5459 Other low back pain: Secondary | ICD-10-CM | POA: Diagnosis not present

## 2022-09-09 DIAGNOSIS — M79605 Pain in left leg: Secondary | ICD-10-CM

## 2022-09-09 DIAGNOSIS — M6281 Muscle weakness (generalized): Secondary | ICD-10-CM

## 2022-09-09 NOTE — Patient Instructions (Signed)
Access Code: [email or web address] URL: [email or web address] Date: 09/09/2022 Prepared by: Rosana Hoes  Exercises - Supine Hamstring Stretch with Strap  - 2 x daily - 3 reps - 20 seconds hold - Supine Posterior Pelvic Tilt  - 2 x daily - 10 reps - a few seconds hold - Supine Lower Trunk Rotation  - 2 x daily - 10 reps - a few seconds hold - Supine Hip Internal and External Rotation  - 2 x daily - 10 reps - a few seconds hold - Supine Piriformis Stretch with Foot on Ground  - 2 x daily - 3 reps - 30 second hold - Hooklying Clamshell with Resistance  - 1 x daily - 2 sets - 10 reps - 5 hold - Supine Bridge  - 1 x daily - 2 sets - 10 reps - Straight Leg Raise  - 1 x daily - 2 sets - 10 reps - Sit to Stand Without Arm Support  - 1 x daily - 2 sets - 10 reps - Sidelying Hip Abduction  - 1 x daily - 7 x weekly - 1-2 sets - 10 reps - 3 hold - Prone Hip Extension on Table  - 1 x daily - 7 x weekly - 2 sets - 15 reps - 3 hold - Standing Hip Abduction with Resistance at Thighs  - 1 x daily - 2 sets - 15 reps

## 2022-09-12 ENCOUNTER — Ambulatory Visit
Admission: RE | Admit: 2022-09-12 | Discharge: 2022-09-12 | Disposition: A | Discharge status: Home / Self Care | Payer: Medicaid Other | Source: Ambulatory Visit | Attending: Physical Medicine & Rehabilitation | Admitting: Physical Medicine & Rehabilitation

## 2022-09-12 DIAGNOSIS — M5126 Other intervertebral disc displacement, lumbar region: Secondary | ICD-10-CM | POA: Diagnosis not present

## 2022-09-12 DIAGNOSIS — M5127 Other intervertebral disc displacement, lumbosacral region: Secondary | ICD-10-CM | POA: Diagnosis not present

## 2022-09-12 DIAGNOSIS — M5416 Radiculopathy, lumbar region: Secondary | ICD-10-CM

## 2022-09-12 DIAGNOSIS — M47816 Spondylosis without myelopathy or radiculopathy, lumbar region: Secondary | ICD-10-CM | POA: Diagnosis not present

## 2022-09-12 MED ORDER — GADOPICLENOL 0.5 MMOL/ML IV SOLN
10.0000 mL | Freq: Once | INTRAVENOUS | Status: AC | PRN
  Administered 2022-09-12: 10 mL via INTRAVENOUS

## 2022-09-14 ENCOUNTER — Other Ambulatory Visit: Payer: Self-pay | Admitting: Physical Medicine & Rehabilitation

## 2022-09-14 ENCOUNTER — Ambulatory Visit: Payer: Medicaid Other | Admitting: Physical Therapy

## 2022-09-14 ENCOUNTER — Encounter: Payer: Self-pay | Admitting: Physical Therapy

## 2022-09-14 DIAGNOSIS — M6281 Muscle weakness (generalized): Secondary | ICD-10-CM

## 2022-09-14 DIAGNOSIS — M5459 Other low back pain: Secondary | ICD-10-CM | POA: Diagnosis not present

## 2022-09-14 DIAGNOSIS — M79605 Pain in left leg: Secondary | ICD-10-CM

## 2022-09-14 DIAGNOSIS — M7061 Trochanteric bursitis, right hip: Secondary | ICD-10-CM

## 2022-09-14 MED ORDER — OXYCODONE-ACETAMINOPHEN 10-325 MG PO TABS: ORAL_TABLET | ORAL | 0 refills | Status: DC

## 2022-09-14 NOTE — Therapy (Addendum)
 OUTPATIENT PHYSICAL THERAPY TREATMENT NOTE  DISCHARGE   Patient Name: [name] MRN: [id number] DOB:1970/09/07, 52 y.o., female 59 Date: 09/14/2022  PCP: Andi Devon, MD REFERRING PROVIDER: Erick Colace, MD   END OF SESSION:   PT End of Session - 09/14/22 1108     Visit Number 16    Number of Visits 17    Date for PT Re-Evaluation 09/15/22    Authorization Type [facility] [employer]    PT Start Time 1107    PT Stop Time 1145    PT Time Calculation (min) 38 min                      Past Medical History:  Diagnosis Date   Anemia    hx   Arthritis    Complication of anesthesia    slow to wake up,   very difficult to get iv had picc line last time   Difficult intravenous access    hands are usually best for blood draws and IV starts   DVT (deep venous thrombosis) (HCC)    ? vs lupus   Fibroids    Headache(784.0)    Neck pain    Scoliosis    Past Surgical History:  Procedure Laterality Date   ABDOMINAL HYSTERECTOMY  06/29/11   Robotic Assisted Hysterectomy   CERVICAL SPINE SURGERY  10   CESAREAN SECTION  1996   LUMBAR LAMINECTOMY  08/31/2011   Procedure: MICRODISCECTOMY LUMBAR LAMINECTOMY;  Surgeon: Jacki Cones, MD;  Location: [facility];  Service: Orthopedics;  Laterality: N/A;  lumbar five sacral one central microdisectomy   MYOMECTOMY  13   NECK SURGERY     POSTERIOR CERVICAL FUSION/FORAMINOTOMY N/A 02/02/2013   Procedure: POSTERIOR C5-6 SPINAL FUSION;  Surgeon: Venita Lick, MD;  Location: [facility];  Service: Orthopedics;  Laterality: N/A;   POSTERIOR FUSION CERVICAL SPINE  02/02/2013   C 5 C6     Dr Shon Baton   WISDOM TOOTH EXTRACTION     Patient Active Problem List   Diagnosis Date Noted   Greater trochanteric bursitis of right hip 02/12/2015   Metabolic syndrome 08/27/2014   Other constipation 05/24/2014   Greater trochanteric bursitis of left hip 09/08/2013   UTI (urinary tract infection) 09/03/2011   Hypokalemia 09/03/2011   Cauda  equina syndrome (HCC) 09/03/2011   Spinal stenosis of lumbar region 08/31/2011   Lumbar herniated disc 08/31/2011   Abdominal pain, acute, generalized 08/28/2011   Nausea and vomiting 08/28/2011   Dehydration 08/28/2011   S/P hysterectomy 4/13 08/28/2011   Menometrorrhagia 05/21/2011   DVT of right leg (deep venous thrombosis) in 01/2011 05/21/2011   Fibroid uterus 03/20/2011   LBP (low back pain) 05/29/2010   Avitaminosis D 04/23/2010   Benign essential HTN 04/21/2010   Essential (primary) hypertension 04/21/2010   Allergic rhinitis 11/01/2009   Absolute anemia 11/01/2009   Headache, migraine 11/01/2009   Adiposity 11/01/2009    REFERRING DIAG: Lumbar radiculitis; Lumbar post-laminectomy syndrome  THERAPY DIAG:  Other low back pain  Pain in left leg  Muscle weakness (generalized)  Rationale for Evaluation and Treatment Rehabilitation  PERTINENT HISTORY: L5-S1 decompression microdiscectomy and laminectomy 2013; Posterior C5-C6 fusion 2014  PRECAUTIONS: None   SUBJECTIVE:  SUBJECTIVE STATEMENT: Overall, I am 50% better.   PAIN:  Are you having pain? Yes:  NPRS scale: 7/10 low back(10/10 at worse), 5/10 hip, 8/10 hamstring pain.  Pain location: Low back, left hip and back of leg Pain description: Aching and stabbing, shooting, numbness, tingling Aggravating factors: Sitting or walking and bending Relieving factors: Medication, a little exercise, heating pad   OBJECTIVE: (objective measures completed at initial evaluation unless otherwise dated) PATIENT SURVEYS:  Modified Oswestry 60% disability  07/08/22: 27/50, 54% 08/07/2022: 28/50, 56% 09/07/2022: 29/50, 58%          SENSATION: Patient report sensation deficit of left thight   MUSCLE LENGTH: Flexibility deficits noted bilateral  hamstring (left worse than right) and hip musculature   POSTURE:             Increased lumbar lordosis   PALPATION: Global tenderness to palpation of lumbar and left gluteal region   LUMBAR ROM:    AROM eval 06/26/22 08/04/22 09/14/22  Flexion 25% - fingertips to knees 50% fingertips proximal shin Nearly reaches toes  Nearly reaches toes   Extension 50%   50%  Right lateral flexion 75%  75% 75%  Left lateral flexion 50%  75% 75%  Right rotation 50%  50% 75%  Left rotation 25%  75% 75%   (Blank rows = not tested)   Patient reports low back pain and left hip/posterior thigh pain with all lumbar movement   LOWER EXTREMITY MMT:     MMT Right eval Left eval Rt / Lt 07/13/2022 Rt/LT 08/04/22 Rt / Lt 09/07/2022 Rt/LT 09/14/22  Hip flexion 4 4- 4 / 4- 4/4 4 / 4- 4/ 4-  Hip extension 3 3 3  / 3 3 /3+ 3+ / 3 3+ / 3   Hip abduction 3 3 3  / 3 4-/4- 4- / 4- 4- / 4-  Hip adduction          Hip internal rotation          Hip external rotation          Knee flexion 5 4+      Knee extension 5 5      Ankle dorsiflexion 5 4+      Ankle plantarflexion          Ankle inversion          Ankle eversion 5 4+       (Blank rows = not tested)   LUMBAR SPECIAL TESTS:  Slump positive on left   FUNCTIONAL TESTS:  Unable to perform DLLT  06/29/22: 5 x STS : 26 sec without UE - standard Mat  08/04/22: 21.4  sec with out UE- standard Mat  09/02/2022: 17 sec with out UE- standard mat    GAIT: Assistive device utilized: Single point cane Level of assistance: Modified independence Comments: Slightly antalgic on the left     TODAY'S TREATMENT:   [facility] Adult PT Treatment:                                                DATE: 09/14/22 Therapeutic Exercise: Nustep L5 x 5 minutes  Review of HEP Hamstring stretches     [facility] Adult PT Treatment:  DATE: 09/09/22 Therapeutic Exercise: Nustep L6 x 5 min with UE/LE while taking subjective Seated hamstring stretch  with foot on step stool 3 x 20 sec Supine hamstring stretch with strap for HEP demo x 20 sec each LTR x 10 Bridge 2 x 10 SLR with abdominal engagement 2 x 10 each Sidelying hip abduction 2 x 15 each Sit to stand 2 x 10 holding 10# at chest Springboard bar pull-down with yellow springs 10 x 3 sec Pallof press with yellow spring 10 x 3 sec each Forward 8" step-up x 10 each   [facility] Adult PT Treatment:                                                DATE: 09/07/22 Therapeutic Exercise: Nustep L6 x 5 min with UE/LE while taking subjective Hooklying clamshell with blue 2 x 20 Bridge 2 x 10 SLR with abdominal engagement 2 x 10 each Sidelying hip abduction 2 x 15 each Sit to stand 2 x 10 Standing hip extension with red 2 x 15 each with forearms on counter  Forward 8" step-up 2 x 10 each  [facility] Adult PT Treatment:                                                DATE: 09/02/22 Therapeutic Exercise: Nustep L6 x 5 min with UE/LE while taking subjective LTR x 10 Hooklying clamshell with blue 2 x 15 Bridge 2 x 10 SLR with abdominal engagement 2 x 10 each Sit to stand with 10# 2 x 10 Standing hip extension with red 2 x 10 each with forearms on counter  Standing hip abduction with red 2 x 15 each  Seated lumbar flexion physioball (red) roll-out 10 x 5 sec Seated abdominal set with physioball press 10 x 5 sec fwd and lateral  PATIENT EDUCATION:  Education details: HEP update Person educated: Patient Education method: Programmer, multimedia, Demonstration, Tactile cues, Verbal cues, Handout Education comprehension: verbalized understanding, returned demonstration, verbal cues required, tactile cues required, and needs further education   HOME EXERCISE PROGRAM: Access Code: [email or web address]   Exercises - Supine Hamstring Stretch with Strap  - 2 x daily - 3 reps - 20 seconds hold - Supine Posterior Pelvic Tilt  - 2 x daily - 10 reps - a few seconds hold - Supine Lower Trunk Rotation  - 2 x daily - 10 reps - a  few seconds hold - Supine Hip Internal and External Rotation  - 2 x daily - 10 reps - a few seconds hold - Supine Piriformis Stretch with Foot on Ground  - 2 x daily - 3 reps - 30 second hold - Hooklying Clamshell with Resistance  - 1 x daily - 2 sets - 10 reps - 5 hold - Supine Bridge  - 1 x daily - 2 sets - 10 reps - Straight Leg Raise  - 1 x daily - 2 sets - 10 reps - Sit to Stand Without Arm Support  - 1 x daily - 2 sets - 10 reps - Sidelying Hip Abduction  - 1 x daily - 7 x weekly - 1-2 sets - 10 reps - 3 hold - Prone Hip Extension on Table  - 1 x  daily - 7 x weekly - 2 sets - 15 reps - 3 hold - Standing Hip Abduction with Resistance at Thighs  - 1 x daily - 2 sets - 15 reps     ASSESSMENT: CLINICAL IMPRESSION: Patient tolerated therapy well with no adverse effects. Therapy focused on reviewing HEP and checking LTGs. She reports overall 50% improvement in pain/ function. Her pain levels continue to be between 7-10/10 at all times in her back, hip and hamstrings. Pt has reached a plateau with her progress and is appropriate for discharge to HEP. She is independent and reports daily compliance with HEP.       OBJECTIVE IMPAIRMENTS: Abnormal gait, decreased activity tolerance, difficulty walking, decreased ROM, decreased strength, impaired flexibility, postural dysfunction, and pain.    ACTIVITY LIMITATIONS: carrying, lifting, bending, sitting, standing, squatting, sleeping, transfers, bed mobility, bathing, hygiene/grooming, and locomotion level   PARTICIPATION LIMITATIONS: meal prep, cleaning, laundry, driving, shopping, community activity, and occupation   PERSONAL FACTORS: Fitness, Past/current experiences, Time since onset of injury/illness/exacerbation, and 1 comorbidity: see PMH above  are also affecting patient's functional outcome.      GOALS: Goals reviewed with patient? Yes   SHORT TERM GOALS: Target date: 07/03/2022   Patient will be I with initial HEP in order to  progress with therapy. Baseline: HEP provided at eval 06/29/22: compliance and independent  Goal status: MET   2.  Patient will report </= 7/10 pain in order to reduce functional limitations Baseline: 9/10 06/29/22: 8/10  07/08/22: 9/10 08/04/22: never lower than 7/10, 10/10 today.  09/07/2022: 8/10 09/14/22: 7/10 in back, 8/10 in hamstring Goal status: NOT MET   LONG TERM GOALS: Target date: 09/15/2022   Patient will be I with final HEP to maintain progress from PT. Baseline: HEP provided at eval 08/07/2022: progressing 09/14/22: I Goal status: MET   2.  Patient will report Modified Oswestry </= 40% in order to indicate improved functional ability and reduced pain Baseline: 60% 07/08/22: 54% 08/07/2022: 56%  09/07/2022: 58% Goal status: NOT MET   3.  Patient will demonstrate a 25% improvement in lumbar AROM in order to improve dressing and self care ability Baseline: see lumbar AROM limitations noted above 08/04/22: see chart Goal status: Partially met    4.  Patient will exhibit improved hip strength >/= 4-/5 MMT in order to improve standing and walking tolerance Baseline: 3/5 MMT 08/07/2022: seated above 09/07/2022: see limitations above Goal status:  NOT MET     PLAN: PT FREQUENCY: 1-2x/week   PT DURATION: 8 weeks   PLANNED INTERVENTIONS: Therapeutic exercises, Therapeutic activity, Neuromuscular re-education, Balance training, Gait training, Patient/Family education, Self Care, Joint mobilization, Aquatic Therapy, Dry Needling, Electrical stimulation, Cryotherapy, Moist heat, Manual therapy, and Re-evaluation.   PLAN FOR NEXT SESSION: N/A Dc to HEP     Jannette Spanner, PTA 09/14/22 2:23 PM Phone: [phone] Fax: [phone]     PHYSICAL THERAPY DISCHARGE SUMMARY  Visits from Start of Care: 16  Current functional level related to goals / functional outcomes: See above   Remaining deficits: See above   Education / Equipment: HEP   Patient agrees to  discharge. Patient goals were not met. Patient is being discharged due to not returning since the last visit.  Rosana Hoes, PT, DPT, LAT, ATC 05/12/23  8:52 AM Phone: [phone] Fax: [phone]

## 2022-09-22 ENCOUNTER — Encounter: Payer: Self-pay | Admitting: Physical Medicine & Rehabilitation

## 2022-09-22 ENCOUNTER — Encounter: Payer: Medicaid Other | Attending: Physical Medicine and Rehabilitation | Admitting: Physical Medicine & Rehabilitation

## 2022-09-22 VITALS — BP 141/91 | HR 97 | Ht 65.0 in | Wt 277.0 lb

## 2022-09-22 DIAGNOSIS — M47816 Spondylosis without myelopathy or radiculopathy, lumbar region: Secondary | ICD-10-CM | POA: Diagnosis not present

## 2022-09-22 DIAGNOSIS — Z5181 Encounter for therapeutic drug level monitoring: Secondary | ICD-10-CM | POA: Diagnosis present

## 2022-09-22 DIAGNOSIS — M7062 Trochanteric bursitis, left hip: Secondary | ICD-10-CM | POA: Insufficient documentation

## 2022-09-22 DIAGNOSIS — G834 Cauda equina syndrome: Secondary | ICD-10-CM | POA: Insufficient documentation

## 2022-09-22 DIAGNOSIS — G894 Chronic pain syndrome: Secondary | ICD-10-CM | POA: Diagnosis present

## 2022-09-22 DIAGNOSIS — M7061 Trochanteric bursitis, right hip: Secondary | ICD-10-CM | POA: Diagnosis present

## 2022-09-22 DIAGNOSIS — Z79891 Long term (current) use of opiate analgesic: Secondary | ICD-10-CM | POA: Diagnosis present

## 2022-09-22 DIAGNOSIS — M5416 Radiculopathy, lumbar region: Secondary | ICD-10-CM | POA: Insufficient documentation

## 2022-09-22 NOTE — Progress Notes (Signed)
Subjective:    Patient ID: [name], female    DOB: [birth year], 52 y.o.   MRN: [id number]  HPI  PT was helpful for Left lateral thigh pain , walking without cane at times.  No new injuries no falls.  She is asking about the role of obesity and low back pain.  We discussed  lumbar spine biomechanics  Patient here to review MRI results.  MRI LUMBAR SPINE WITHOUT AND WITH CONTRAST   TECHNIQUE: Multiplanar and multiecho pulse sequences of the lumbar spine were obtained without and with intravenous contrast.   CONTRAST:  10 mL Vueway, 0 mL wasted   COMPARISON:  08/29/2011   FINDINGS: Segmentation:  Standard.   Alignment:  Physiologic.   Vertebrae: No acute fracture, evidence of discitis, or aggressive bone lesion.   Conus medullaris and cauda equina: Conus extends to the L2 level. Conus and cauda equina appear normal.   Paraspinal and other soft tissues: No acute paraspinal abnormality. Postsurgical changes in the posterior paraspinal soft tissues at L4-5.   Disc levels:   Disc spaces: Degenerative disease with disc height loss at L5-S1. Disc desiccation at L3-4 and L4-5.   T12-L1: No significant disc bulge. No neural foraminal stenosis. No central canal stenosis.   L1-L2: No significant disc bulge. No neural foraminal stenosis. No central canal stenosis.   L2-L3: No significant disc bulge. Mild bilateral facet arthropathy. No foraminal or central canal stenosis.   L3-L4: No significant disc bulge. Moderate bilateral facet arthropathy. No foraminal or central canal stenosis.   L4-L5: Broad-based disc bulge with a small left paracentral disc protrusion and a prominent left foraminal component. Mild scarring around the L4 nerve root. Moderate left foraminal stenosis. Mild right foraminal stenosis. Severe bilateral facet arthropathy with bilateral facet effusions. 6 mm sequestered disc fragment in the right subarticular recess abutting the right L5 nerve root.  No spinal stenosis. Scratch them mild spinal stenosis.   L5-S1: Broad-based disc bulge. Mild bilateral facet arthropathy. Mild bilateral foraminal stenosis. No spinal stenosis.   IMPRESSION: 1. At L4-5 there is a broad-based disc bulge with a small left paracentral disc protrusion and a prominent left foraminal component. Mild scarring around the L4 nerve root. Moderate left foraminal stenosis. Mild right foraminal stenosis. Severe bilateral facet arthropathy with bilateral facet effusions. 6 mm sequestered disc fragment in the right subarticular recess abutting the right L5 nerve root. 2. At L5-S1 there is a broad-based disc bulge. Mild bilateral facet arthropathy. Mild bilateral foraminal stenosis. 3. No acute osseous injury of the lumbar spine.     Electronically Signed   By: Elige Ko M.D.   On: 09/20/2022 12:12 Pain Inventory Average Pain 8 Pain Right Now 8 My pain is sharp, dull, stabbing, tingling, and aching  In the last 24 hours, has pain interfered with the following? General activity 8 Relation with others 9 Enjoyment of life 9 What TIME of day is your pain at its worst? morning , daytime, evening, night, and varies Sleep (in general) Poor  Pain is worse with: walking, bending, sitting, and standing Pain improves with: rest, heat/ice, therapy/exercise, and medication Relief from Meds: 2  Family History  Problem Relation Age of Onset   Hypertension Maternal Grandmother    Anesthesia problems Neg Hx    Social History   Socioeconomic History   Marital status: Divorced    Spouse name: Not on file   Number of children: Not on file   Years of education: Not on file  Highest education level: Not on file  Occupational History   Not on file  Tobacco Use   Smoking status: Never   Smokeless tobacco: Never  Vaping Use   Vaping Use: Never used  Substance and Sexual Activity   Alcohol use: No   Drug use: No   Sexual activity: Not Currently    Birth  control/protection: None    Comment: IUD removed 2 wks ago.  Other Topics Concern   Not on file  Social History Narrative   Not on file   Social Determinants of Health   Financial Resource Strain: Not on file  Food Insecurity: Not on file  Transportation Needs: Not on file  Physical Activity: Not on file  Stress: Not on file  Social Connections: Not on file   Past Surgical History:  Procedure Laterality Date   ABDOMINAL HYSTERECTOMY  06/29/11   Robotic Assisted Hysterectomy   CERVICAL SPINE SURGERY  10   CESAREAN SECTION  1996   LUMBAR LAMINECTOMY  08/31/2011   Procedure: MICRODISCECTOMY LUMBAR LAMINECTOMY;  Surgeon: Jacki Cones, MD;  Location: [facility];  Service: Orthopedics;  Laterality: N/A;  lumbar five sacral one central microdisectomy   MYOMECTOMY  13   NECK SURGERY     POSTERIOR CERVICAL FUSION/FORAMINOTOMY N/A 02/02/2013   Procedure: POSTERIOR C5-6 SPINAL FUSION;  Surgeon: Venita Lick, MD;  Location: [facility];  Service: Orthopedics;  Laterality: N/A;   POSTERIOR FUSION CERVICAL SPINE  02/02/2013   C 5 C6     Dr Shon Baton   WISDOM TOOTH EXTRACTION     Past Surgical History:  Procedure Laterality Date   ABDOMINAL HYSTERECTOMY  06/29/11   Robotic Assisted Hysterectomy   CERVICAL SPINE SURGERY  10   CESAREAN SECTION  1996   LUMBAR LAMINECTOMY  08/31/2011   Procedure: MICRODISCECTOMY LUMBAR LAMINECTOMY;  Surgeon: Jacki Cones, MD;  Location: [facility];  Service: Orthopedics;  Laterality: N/A;  lumbar five sacral one central microdisectomy   MYOMECTOMY  13   NECK SURGERY     POSTERIOR CERVICAL FUSION/FORAMINOTOMY N/A 02/02/2013   Procedure: POSTERIOR C5-6 SPINAL FUSION;  Surgeon: Venita Lick, MD;  Location: [facility];  Service: Orthopedics;  Laterality: N/A;   POSTERIOR FUSION CERVICAL SPINE  02/02/2013   C 5 C6     Dr Shon Baton   WISDOM TOOTH EXTRACTION     Past Medical History:  Diagnosis Date   Anemia    hx   Arthritis    Complication of anesthesia    slow to wake up,    very difficult to get iv had picc line last time   Difficult intravenous access    hands are usually best for blood draws and IV starts   DVT (deep venous thrombosis) (HCC)    ? vs lupus   Fibroids    Headache(784.0)    Neck pain    Scoliosis    LMP 02/27/2011   Opioid Risk Score:   Fall Risk Score:  `1  Depression screen [facility] 2/9     07/31/2022   11:48 AM 06/01/2022   11:24 AM 05/19/2022   10:44 AM 03/11/2022   11:19 AM 10/15/2021   11:27 AM 07/08/2021   11:27 AM 03/28/2021   11:26 AM  Depression screen PHQ 2/9  Decreased Interest 0 0 0 0 0 0 0  Down, Depressed, Hopeless 0 0 0 0 0 0 0  PHQ - 2 Score 0 0 0 0 0 0 0      Review of Systems  Musculoskeletal:  Positive for back pain.       LT leg and thigh pain  All other systems reviewed and are negative.      Objective:   Physical Exam  General no acute distress Mood and affect appropriate Lumbar spine has mild tenderness to palpation lumbar paraspinal area from L4-S1 No pain over PSIS area Negative thigh thrust test Negative Faber's Negative lateral compression test except at the greater trochanter area. Negative distraction test 5/5 strength bilateral hip flexor knee extensor ankle dorsiflexor Sensation normal to light touch in the lower extremities      Assessment & Plan:   #1.  Review of lumbar MRI shows no clear-cut left-sided spinal nerve compression that would lead to anterior or lateral thigh pain.  Specifically left L2-3 left L3-4 levels show no significant foraminal or central stenosis.  No significant disc herniations at these levels. She does have some encroachment at L4-5 however this is unlikely to produce the symptoms that she is complaining of.  She does have some physical exam findings significant for stroke bursitis on the left side.  In addition she does have fairly significant lumbar spondylosis in the facet joints and appears to be more symptomatic with lumbar extension. We discussed continue  with home exercise program PT we discussed weight loss and if no better would consider lumbar medial branch blocks left L3,4and L5 dorsal ramus injection. Would avoid use of contrast given her shellfish and iodine allergies.  May consider trochanteric bursa injection on the left side as well.

## 2022-09-22 NOTE — Patient Instructions (Addendum)
Hip Bursitis  Hip bursitis is swelling of one or more fluid-filled sacs (bursae) in your hip joint. If the bursa becomes irritated, it can fill with extra fluid and become swollen. This condition can cause pain, and your symptoms may come and go over time. What are the causes? Repeated use of your hip muscles. Injury to the hip. Weak butt muscles. Bone spurs. Infection. In some cases, the cause may not be known. What increases the risk? Having a past hip injury or hip surgery. Having a condition, such as arthritis, gout, diabetes, or thyroid disease. Having spine problems. Having one leg that is shorter than the other. Running a lot or doing long-distance running. Playing sports where there is a risk of injury or falling, such as football, martial arts, or skiing. What are the signs or symptoms? Symptoms may come and go, and they often include: Pain in the hip or groin area. Pain may get worse when you move your hip. Tenderness and swelling of the hip. In rare cases, the bursa may become infected. If this happens, you may: Get a fever. Have warmth and redness in the hip area. How is this treated? This condition is treated by: Resting your hip. Icing your hip. Wrapping the hip area with an elastic bandage (compression wrap). Keeping the hip raised. Other treatments may include: Using crutches, a cane, or a walker. Medicines. Draining fluid out of the bursa. Surgery to take out a bursa. This is rare. Long-term treatment may include: Doing exercises to help your strength and flexibility. Lifestyle changes like losing weight to lessen the strain on your hip. Follow these instructions at home: Managing pain, stiffness, and swelling     If told, put ice on the painful area. To do this: Put ice in a plastic bag. Place a towel between your skin and the bag. Leave the ice on for 20 minutes, 2-3 times a day. Take off the ice if your skin turns bright red. This is very important.  If you cannot feel pain, heat, or cold, you have a greater risk of damage to the area. Raise your hip by putting a pillow under your hips while you lie down. Stop if you feel pain. If told, put heat on the affected area. Do this as often as told by your doctor. Use the heat source that your doctor recommends, such as a moist heat pack or a heating pad. Place a towel between your skin and the heat source. Leave the heat on for 20-30 minutes. Take off the heat if your skin turns bright red. This is very important. If you cannot feel pain, heat, or cold, you have a greater risk of getting burned. Activity Do not use your hip to support your body weight until your doctor says that you can. Use crutches, a cane, or a walker as told by your doctor. If the affected leg is one that you use to drive, ask your doctor if it is safe to drive. Rest and protect your hip as much as you can until you feel better. Return to your normal activities when your doctor says that it is safe. Do exercises as told by your doctor. General instructions Take over-the-counter and prescription medicines only as told by your doctor. Gently rub and stretch your injured area as often as is comfortable. Wear elastic bandages only as told by your doctor. If one of your legs is shorter than the other, get fitted for a shoe insert or orthotic. Keep a healthy  weight. Follow instructions from your doctor. Keep all follow-up visits. How is this prevented? Exercise regularly or as told by your doctor. Wear the right shoes for the sport you play and for daily activities. Warm up and stretch before being active. Cool down and stretch after being active. Take breaks often from repeated activity. Avoid activities that bother your hip or cause pain. Avoid sitting down for a long time. Where to find more information American Academy [employer]: orthoinfo.aaos.org Contact a doctor if: You have a fever. You have new  symptoms. You have trouble walking or doing everyday activities. You have pain that gets worse or does not get better with medicine. The skin around your hip is red. You get a feeling of warmth in your hip area. Get help right away if: You cannot move your hip. You have very bad pain. You cannot control the muscles in your feet. Summary Hip bursitis is swelling of one or more fluid-filled sacs (bursae) in your hip joint. Symptoms often come and go over time. This condition is often treated by resting and icing the hip. It also may help to keep the area raised and wrapped in an elastic bandage. Other treatments may be needed. This information is not intended to replace advice given to you by your health care provider. Make sure you discuss any questions you have with your health care provider. Document Revised: 03/04/2021 Document Reviewed: 03/04/2021 Elsevier Patient Education  2024 Elsevier Inc.   Lumbar medial branch blocks on left side for low back pain Left trochanteric bursa injections for bursitis pain

## 2022-10-02 ENCOUNTER — Ambulatory Visit
Admission: EM | Admit: 2022-10-02 | Discharge: 2022-10-02 | Disposition: A | Discharge status: Home / Self Care | Payer: Medicaid Other

## 2022-10-02 ENCOUNTER — Other Ambulatory Visit: Payer: Self-pay

## 2022-10-02 DIAGNOSIS — S90821A Blister (nonthermal), right foot, initial encounter: Secondary | ICD-10-CM

## 2022-10-02 DIAGNOSIS — M542 Cervicalgia: Secondary | ICD-10-CM | POA: Insufficient documentation

## 2022-10-02 DIAGNOSIS — L089 Local infection of the skin and subcutaneous tissue, unspecified: Secondary | ICD-10-CM

## 2022-10-02 DIAGNOSIS — R635 Abnormal weight gain: Secondary | ICD-10-CM | POA: Insufficient documentation

## 2022-10-02 DIAGNOSIS — M25471 Effusion, right ankle: Secondary | ICD-10-CM

## 2022-10-02 DIAGNOSIS — Z91018 Allergy to other foods: Secondary | ICD-10-CM | POA: Insufficient documentation

## 2022-10-02 DIAGNOSIS — M329 Systemic lupus erythematosus, unspecified: Secondary | ICD-10-CM | POA: Insufficient documentation

## 2022-10-02 DIAGNOSIS — K625 Hemorrhage of anus and rectum: Secondary | ICD-10-CM | POA: Insufficient documentation

## 2022-10-02 DIAGNOSIS — E669 Obesity, unspecified: Secondary | ICD-10-CM | POA: Insufficient documentation

## 2022-10-02 DIAGNOSIS — R7309 Other abnormal glucose: Secondary | ICD-10-CM | POA: Insufficient documentation

## 2022-10-02 DIAGNOSIS — R141 Gas pain: Secondary | ICD-10-CM | POA: Insufficient documentation

## 2022-10-02 MED ORDER — SULFAMETHOXAZOLE-TRIMETHOPRIM 800-160 MG PO TABS: 1.0000 | ORAL_TABLET | Freq: Two times a day (BID) | ORAL | 0 refills | Status: AC

## 2022-10-02 NOTE — ED Provider Notes (Signed)
[facility]    CSN: [id number] Arrival date & time: 10/02/22  4098      History   Chief Complaint Chief Complaint  Patient presents with   Foot Pain    HPI [name] is a 52 y.o. female.   HPI Patient presents today for evaluation of right ankle swelling and a right ankle lister type wound. She noticed the area 2 days ago and has subsequently developed itching, diffuse swelling of her right ankle and increased redness and warmth ending away from the area of the blister.  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  She has not had any fever. Patient endorses clear and bloody draining from the site.  Denies any numbness, tingling or any injury related to ankle swelling. Past Medical History:  Diagnosis Date   Anemia    hx   Arthritis    Complication of anesthesia    slow to wake up,   very difficult to get iv had picc line last time   Difficult intravenous access    hands are usually best for blood draws and IV starts   DVT (deep venous thrombosis) (HCC)    ? vs lupus   Fibroids    Headache(784.0)    Neck pain    Scoliosis     Patient Active Problem List   Diagnosis Date Noted   Allergy to food 10/02/2022   Abnormal glucose level 10/02/2022   Abnormal weight gain 10/02/2022   Flatulence, eructation and gas pain 10/02/2022   Neck pain 10/02/2022   Rectal bleeding 10/02/2022   Systemic lupus erythematosus (HCC) 10/02/2022   Obesity with body mass index 30 or greater 10/02/2022   Morbid obesity (HCC) 10/02/2022   Greater trochanteric bursitis of right hip 02/12/2015   Metabolic syndrome 08/27/2014   Other constipation 05/24/2014   Greater trochanteric bursitis of left hip 09/08/2013   UTI (urinary tract infection) 09/03/2011   Hypokalemia 09/03/2011   Cauda equina syndrome (HCC) 09/03/2011   Spinal stenosis of lumbar region 08/31/2011   Lumbar herniated disc  08/31/2011   Abdominal pain, acute, generalized 08/28/2011   Nausea and vomiting 08/28/2011   Dehydration 08/28/2011   S/P hysterectomy 4/13 08/28/2011   Menometrorrhagia 05/21/2011   DVT of right leg (deep venous thrombosis) in 01/2011 05/21/2011   Fibroid uterus 03/20/2011   LBP (low back pain) 05/29/2010   Avitaminosis D 04/23/2010   Vitamin D deficiency 04/23/2010   Benign essential HTN 04/21/2010   Essential (primary) hypertension 04/21/2010   Allergic rhinitis 11/01/2009   Absolute anemia 11/01/2009   Headache, migraine 11/01/2009   Adiposity 11/01/2009   Anemia, unspecified 11/01/2009   Obesity, unspecified 11/01/2009    Past Surgical History:  Procedure Laterality Date   ABDOMINAL HYSTERECTOMY  06/29/11   Robotic Assisted Hysterectomy   CERVICAL SPINE SURGERY  10   CESAREAN SECTION  1996   LUMBAR LAMINECTOMY  08/31/2011   Procedure: MICRODISCECTOMY LUMBAR LAMINECTOMY;  Surgeon: Jacki Cones, MD;  Location: [facility];  Service: Orthopedics;  Laterality: N/A;  lumbar five sacral one central microdisectomy   MYOMECTOMY  13   NECK SURGERY     POSTERIOR CERVICAL FUSION/FORAMINOTOMY N/A 02/02/2013   Procedure: POSTERIOR C5-6 SPINAL FUSION;  Surgeon: Venita Lick, MD;  Location: [facility];  Service: Orthopedics;  Laterality: N/A;   POSTERIOR FUSION CERVICAL SPINE  02/02/2013   C 5 C6  Dr Shon Baton   WISDOM TOOTH EXTRACTION      OB History     Gravida  2   Para  1   Term  1   Preterm  0   AB  1   Living  1      SAB  1   IAB  0   Ectopic  0   Multiple  0   Live Births               Home Medications    Prior to Admission medications   Medication Sig Start Date End Date Taking? Authorizing Provider  cetirizine (ZYRTEC) 10 MG tablet Take 1 tablet (10 mg total) by mouth daily. 05/22/14  Yes Massie Maroon, FNP  Cholecalciferol (VITAMIN D-3 PO) Take 1 capsule by mouth daily. 05/25/22  Yes [provider]  citalopram (CELEXA) 10 MG tablet Take 1  tablet by mouth daily.   Yes [provider]  ergocalciferol (DRISDOL) 50000 UNITS capsule Take 1 capsule (50,000 Units total) by mouth once a week. 05/30/14  Yes Massie Maroon, FNP  gabapentin (NEURONTIN) 300 MG capsule TAKE 1 CAPSULE BY MOUTH IN THE MORNING AND AFTERNOON,DINNER AND 2 CAPSULES AT BEDTIME 06/01/22  Yes Jones Bales, NP  oxyCODONE-acetaminophen (PERCOCET) 10-325 MG tablet Take 1 tablet (10/325 mg total) by mouth 5 (five) times daily as needed. 09/14/22  Yes Fanny Dance, MD  sulfamethoxazole-trimethoprim (BACTRIM DS) 800-160 MG tablet Take 1 tablet by mouth 2 (two) times daily for 7 days. 10/02/22 10/09/22 Yes Bing Neighbors, NP  tiZANidine (ZANAFLEX) 4 MG tablet TAKE 1 TO 2 TABLETS BY MOUTH EVERY 8 HOURS AS NEEDED FOR MUSCLE SPASM 08/19/22  Yes Jones Bales, NP  ALPRAZolam Prudy Feeler) 0.5 MG tablet Take 1 tablet by mouth daily as needed.    [provider]  benzonatate (TESSALON) 100 MG capsule Take 1 capsule by mouth every 8 (eight) hours.    [provider]  diclofenac (VOLTAREN) 75 MG EC tablet Take 1 tablet (75 mg total) by mouth 2 (two) times daily with a meal. 06/01/22   Jones Bales, NP  diclofenac (VOLTAREN) 75 MG EC tablet Take 1 tablet by mouth 2 (two) times daily.    [provider]  docusate sodium (COLACE) 100 MG capsule     [provider]  Docusate Sodium (STOOL SOFTENER) 150 MG/15ML syrup     [provider]  hydrocortisone (PROCTO-MED HC) 2.5 % rectal cream     [provider]  hydroxychloroquine (PLAQUENIL) 200 MG tablet  09/14/17   [provider]  linaclotide Karlene Einstein) 290 MCG CAPS capsule     [provider]  linaclotide Karlene Einstein) 290 MCG CAPS capsule  04/17/13   [provider]  LINZESS 145 MCG CAPS capsule Take 145 mcg by mouth daily. 09/16/20   [provider]  ondansetron (ZOFRAN-ODT) 8 MG disintegrating tablet ondansetron 8 mg disintegrating tablet   LET 1 TABLET DISSOLVE ON TOP OF THE TONGUE TWICE A DAY AS NEEDED    [provider]  phentermine 37.5 MG capsule Take 37.5 mg by mouth every morning.    [provider]  predniSONE (DELTASONE) 20 MG tablet Take 2 tablets by mouth daily.    [provider]  scopolamine (TRANSDERM-SCOP) 1 MG/3DAYS  03/12/20   [provider]  Semaglutide-Weight Management (WEGOVY) 0.5 MG/0.5ML SOAJ Inject 0.5 mg into the skin once a week. 05/27/22   [provider]  tirzepatide Greggory Keen) 5  MG/0.5ML Pen     [provider]  oxyCODONE-acetaminophen (PERCOCET) 10-325 MG tablet Take 1 tablet (10/325 mg total) by mouth 5 (five) times daily as needed. 12/30/20   Jones Bales, NP    Family History Family History  Problem Relation Age of Onset   Hypertension Maternal Grandmother    Anesthesia problems Neg Hx     Social History Social History   Tobacco Use   Smoking status: Never   Smokeless tobacco: Never  Vaping Use   Vaping status: Never Used  Substance Use Topics   Alcohol use: No   Drug use: No     Allergies   Penicillins, Shellfish allergy, Codeine, Iodine, Iodinated contrast media, and Meperidine and related   Review of Systems Review of Systems Pertinent negatives listed in HPI  Physical Exam Triage Vital Signs ED Triage Vitals  Encounter Vitals Group     BP 10/02/22 1007 129/86     Systolic BP Percentile --      Diastolic BP Percentile --      Pulse Rate 10/02/22 1007 87     Resp 10/02/22 1007 20     Temp 10/02/22 1007 97.9 F (36.6 C)     Temp Source 10/02/22 1007 Oral     SpO2 10/02/22 1007 96 %     Weight 10/02/22 1003 260 lb (117.9 kg)     Height 10/02/22 1003 5\' 5"  (1.651 m)     Head Circumference --      Peak Flow --      Pain Score 10/02/22 1003 7     Pain Loc --      Pain Education --      Exclude from Growth Chart --    No data found.  Updated Vital Signs BP 129/86 (BP Location: Left Arm)   Pulse 87   Temp  97.9 F (36.6 C) (Oral)   Resp 20   Ht 5\' 5"  (1.651 m)   Wt 260 lb (117.9 kg)   LMP 02/27/2011   SpO2 96%   BMI 43.27 kg/m   Visual Acuity Right Eye Distance:   Left Eye Distance:   Bilateral Distance:    Right Eye Near:   Left Eye Near:    Bilateral Near:     Physical Exam Constitutional:      Appearance: Normal appearance.  HENT:     Head: Normocephalic and atraumatic.  Eyes:     Extraocular Movements: Extraocular movements intact.     Pupils: Pupils are equal, round, and reactive to light.  Cardiovascular:     Rate and Rhythm: Normal rate and regular rhythm.  Pulmonary:     Effort: Pulmonary effort is normal.     Breath sounds: Normal breath sounds.  Musculoskeletal:     Right ankle: Swelling present.       Legs:  Skin:    General: Skin is warm.  Neurological:     General: No focal deficit present.     Mental Status: She is alert.      UC Treatments / Results  Labs (all labs ordered are listed, but only abnormal results are displayed) Labs Reviewed - No data to display  EKG   Radiology No results found.  Procedures Procedures (including critical care time)  Medications Ordered in UC Medications - No data to display  Initial Impression / Assessment and Plan / UC Course  I have reviewed the triage vital signs and the nursing notes.  Pertinent labs & imaging results that  were available during my care of the patient were reviewed by me and considered in my medical decision making (see chart for details).   Right Infected blister and ankle swelling, serosanguineous drainage present with palpation of the blister. Patient has redness and increased warmth to touch involving the entire right ankle.  Will cover with Bactrim twice daily for 7 days.  Patient encouraged to continue to elevate right extremity to reduce swelling.  Also encouraged to apply Neosporin ointment directly to wound and keep covered with a Band-Aid till drainage resolves.  Return  precautions given if symptoms worsen or do not improve. Final Clinical Impressions(s) / UC Diagnoses   Final diagnoses:  Infected blister of right foot, initial encounter  Right ankle swelling   Discharge Instructions   None    ED Prescriptions     Medication Sig Dispense Auth. Provider   sulfamethoxazole-trimethoprim (BACTRIM DS) 800-160 MG tablet Take 1 tablet by mouth 2 (two) times daily for 7 days. 14 tablet Bing Neighbors, NP      PDMP not reviewed this encounter.   Bing Neighbors, NP 10/02/22 1106

## 2022-10-02 NOTE — ED Triage Notes (Signed)
Here for "right foot pain" with swelling/blistering. First noticed "yesterday". No injury known.

## 2022-10-13 ENCOUNTER — Encounter: Payer: Medicaid Other | Admitting: Registered Nurse

## 2022-10-13 VITALS — BP 140/81 | HR 93 | Ht 65.0 in | Wt 273.0 lb

## 2022-10-13 DIAGNOSIS — G834 Cauda equina syndrome: Secondary | ICD-10-CM

## 2022-10-13 DIAGNOSIS — M47816 Spondylosis without myelopathy or radiculopathy, lumbar region: Secondary | ICD-10-CM | POA: Diagnosis not present

## 2022-10-13 DIAGNOSIS — Z5181 Encounter for therapeutic drug level monitoring: Secondary | ICD-10-CM | POA: Diagnosis not present

## 2022-10-13 DIAGNOSIS — Z79891 Long term (current) use of opiate analgesic: Secondary | ICD-10-CM | POA: Diagnosis not present

## 2022-10-13 DIAGNOSIS — M5416 Radiculopathy, lumbar region: Secondary | ICD-10-CM | POA: Diagnosis not present

## 2022-10-13 DIAGNOSIS — M7061 Trochanteric bursitis, right hip: Secondary | ICD-10-CM

## 2022-10-13 DIAGNOSIS — M7062 Trochanteric bursitis, left hip: Secondary | ICD-10-CM

## 2022-10-13 DIAGNOSIS — G894 Chronic pain syndrome: Secondary | ICD-10-CM | POA: Diagnosis not present

## 2022-10-13 MED ORDER — DICLOFENAC SODIUM 75 MG PO TBEC: 75.0000 mg | DELAYED_RELEASE_TABLET | Freq: Two times a day (BID) | ORAL | 3 refills | Status: DC

## 2022-10-13 MED ORDER — OXYCODONE-ACETAMINOPHEN 10-325 MG PO TABS
ORAL_TABLET | ORAL | 0 refills | Status: DC
Start: 2022-10-13 — End: 2022-12-07

## 2022-10-13 MED ORDER — GABAPENTIN 300 MG PO CAPS: ORAL_CAPSULE | ORAL | 3 refills | Status: DC

## 2022-10-13 MED ORDER — OXYCODONE-ACETAMINOPHEN 10-325 MG PO TABS: ORAL_TABLET | ORAL | 0 refills | Status: DC

## 2022-10-13 MED ORDER — TIZANIDINE HCL 4 MG PO TABS: ORAL_TABLET | ORAL | 3 refills | Status: DC

## 2022-10-13 NOTE — Progress Notes (Unsigned)
Subjective:    Patient ID: [name], female    DOB: 1971/02/01, 52 y.o.   MRN: [id number]  HPI: [name] is a 52 y.o. female who returns for follow up appointment for chronic pain and medication refill. states *** pain is located in  ***. rates pain ***. current exercise regime is walking and performing stretching exercises.  [name] Morphine equivalent is *** MME.   UDS ordered today.     Pain Inventory Average Pain 9 Pain Right Now 9 My pain is sharp, dull, stabbing, tingling, and aching  In the last 24 hours, has pain interfered with the following? General activity 8 Relation with others 8 Enjoyment of life 8 What TIME of day is your pain at its worst? morning , daytime, evening, and night Sleep (in general) Poor  Pain is worse with: walking, bending, sitting, standing, and some activites Pain improves with: rest, heat/ice, therapy/exercise, and medication Relief from Meds: 1  Family History  Problem Relation Age of Onset   Hypertension Maternal Grandmother    Anesthesia problems Neg Hx    Social History   Socioeconomic History   Marital status: Divorced    Spouse name: Not on file   Number of children: Not on file   Years of education: Not on file   Highest education level: Not on file  Occupational History   Not on file  Tobacco Use   Smoking status: Never   Smokeless tobacco: Never  Vaping Use   Vaping status: Never Used  Substance and Sexual Activity   Alcohol use: No   Drug use: No   Sexual activity: Not Currently    Birth control/protection: None    Comment: IUD removed 2 wks ago.  Other Topics Concern   Not on file  Social History Narrative   Not on file   Social Determinants of Health   Financial Resource Strain: Not on file  Food Insecurity: Not on file  Transportation Needs: Not on file  Physical Activity: Not on file  Stress: Not on file  Social Connections: Not on file   Past Surgical History:  Procedure Laterality Date    ABDOMINAL HYSTERECTOMY  06/29/11   Robotic Assisted Hysterectomy   CERVICAL SPINE SURGERY  10   CESAREAN SECTION  1996   LUMBAR LAMINECTOMY  08/31/2011   Procedure: MICRODISCECTOMY LUMBAR LAMINECTOMY;  Surgeon: Jacki Cones, MD;  Location: [facility];  Service: Orthopedics;  Laterality: N/A;  lumbar five sacral one central microdisectomy   MYOMECTOMY  13   NECK SURGERY     POSTERIOR CERVICAL FUSION/FORAMINOTOMY N/A 02/02/2013   Procedure: POSTERIOR C5-6 SPINAL FUSION;  Surgeon: Venita Lick, MD;  Location: [facility];  Service: Orthopedics;  Laterality: N/A;   POSTERIOR FUSION CERVICAL SPINE  02/02/2013   C 5 C6     Dr Shon Baton   WISDOM TOOTH EXTRACTION     Past Surgical History:  Procedure Laterality Date   ABDOMINAL HYSTERECTOMY  06/29/11   Robotic Assisted Hysterectomy   CERVICAL SPINE SURGERY  10   CESAREAN SECTION  1996   LUMBAR LAMINECTOMY  08/31/2011   Procedure: MICRODISCECTOMY LUMBAR LAMINECTOMY;  Surgeon: Jacki Cones, MD;  Location: [facility];  Service: Orthopedics;  Laterality: N/A;  lumbar five sacral one central microdisectomy   MYOMECTOMY  13   NECK SURGERY     POSTERIOR CERVICAL FUSION/FORAMINOTOMY N/A 02/02/2013   Procedure: POSTERIOR C5-6 SPINAL FUSION;  Surgeon: Venita Lick, MD;  Location: [facility];  Service: Orthopedics;  Laterality:  N/A;   POSTERIOR FUSION CERVICAL SPINE  02/02/2013   C 5 C6     Dr Shon Baton   WISDOM TOOTH EXTRACTION     Past Medical History:  Diagnosis Date   Anemia    hx   Arthritis    Complication of anesthesia    slow to wake up,   very difficult to get iv had picc line last time   Difficult intravenous access    hands are usually best for blood draws and IV starts   DVT (deep venous thrombosis) (HCC)    ? vs lupus   Fibroids    Headache(784.0)    Neck pain    Scoliosis    BP (!) 140/81   Pulse 93   Ht 5\' 5"  (1.651 m)   Wt 273 lb (123.8 kg)   LMP 02/27/2011   SpO2 98%   BMI 45.43 kg/m   Opioid Risk Score:   Fall Risk Score:   `1  Depression screen [facility] 2/9     09/22/2022   11:40 AM 07/31/2022   11:48 AM 06/01/2022   11:24 AM 05/19/2022   10:44 AM 03/11/2022   11:19 AM 10/15/2021   11:27 AM 07/08/2021   11:27 AM  Depression screen [facility] 2/9  Decreased Interest 0 0 0 0 0 0 0  Down, Depressed, Hopeless 0 0 0 0 0 0 0  PHQ - 2 Score 0 0 0 0 0 0 0     Review of Systems  Musculoskeletal:  Positive for back pain.       Left leg pain Left pelvic pain  All other systems reviewed and are negative.     Objective:   Physical Exam        Assessment & Plan:  1. 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 Status post decompressive laminectomy:Continue to Monitor  Refilled: Oxycodone 10mg /325 mg one tablet five times a day as needed #135.  06/01/2022 We will continue the opioid monitoring program, this consists of regular clinic visits, examinations, urine drug screen, pill counts as well as use of [place] Controlled Substance Reporting system. A 12 month History has been reviewed on the [place] Controlled Substance Reporting System on 06/01/2022. 2. Insomnia/ Anxiety: Continue  Xanax to 0.5 mg at HS, educated on the [employer], she verbalizes understanding. 06/01/2022 3. Lumbar Radiculopathy/Neuropathic pain: Continue current medication regimen with Gabapentin. 06/01/2022 4. Muscle Spasms. Continue current medication regimen with: Tizanidine. 06/01/2022 5. Neurogenic bowel and bladder. Continue Bowel Regimen. Continue to monitor. 06/01/2022 6. Constipation/Opioid Therapy: Continue current medication regimen with  Linzess. 06/01/2022 7. Depression: Continue current medication regimen with Celexa. Continue to monitor. 06/01/2022 8. Left  Greater Trochanteric Bursitis:  Continue  Heat/Ice Therapy. 06/01/2022 9. Chronic Pain Syndrome: Continue current medication regimen with Voltaren. 06/01/2022 10. Chronic Right Knee Pain: No complaints today. Continue HEP as Tolerated.  Continue current medication regimen and continue to monitor. 06/01/2022 11. Cervicalgia/ Cervical Radiculitis: No complaints today. Continue current medication regimen and HEP as tolerated.. Continue to Monitor. 06/01/2022 .    F/U in 2 months

## 2022-10-14 ENCOUNTER — Encounter: Payer: Self-pay | Admitting: Registered Nurse

## 2022-12-07 ENCOUNTER — Encounter: Payer: Self-pay | Admitting: Registered Nurse

## 2022-12-07 ENCOUNTER — Encounter: Payer: Medicaid Other | Attending: Physical Medicine and Rehabilitation | Admitting: Registered Nurse

## 2022-12-07 VITALS — BP 123/88 | HR 96 | Ht 65.0 in | Wt 264.0 lb

## 2022-12-07 DIAGNOSIS — Z5181 Encounter for therapeutic drug level monitoring: Secondary | ICD-10-CM | POA: Insufficient documentation

## 2022-12-07 DIAGNOSIS — M7062 Trochanteric bursitis, left hip: Secondary | ICD-10-CM | POA: Insufficient documentation

## 2022-12-07 DIAGNOSIS — M5416 Radiculopathy, lumbar region: Secondary | ICD-10-CM | POA: Diagnosis not present

## 2022-12-07 DIAGNOSIS — Z79891 Long term (current) use of opiate analgesic: Secondary | ICD-10-CM | POA: Insufficient documentation

## 2022-12-07 DIAGNOSIS — M7061 Trochanteric bursitis, right hip: Secondary | ICD-10-CM | POA: Diagnosis not present

## 2022-12-07 DIAGNOSIS — G894 Chronic pain syndrome: Secondary | ICD-10-CM | POA: Insufficient documentation

## 2022-12-07 DIAGNOSIS — G834 Cauda equina syndrome: Secondary | ICD-10-CM | POA: Diagnosis not present

## 2022-12-07 MED ORDER — OXYCODONE-ACETAMINOPHEN 10-325 MG PO TABS
ORAL_TABLET | ORAL | 0 refills | Status: DC
Start: 2022-12-07 — End: 2023-02-09

## 2022-12-07 MED ORDER — OXYCODONE-ACETAMINOPHEN 10-325 MG PO TABS
ORAL_TABLET | ORAL | 0 refills | Status: DC
Start: 2022-12-07 — End: 2022-12-07

## 2022-12-07 NOTE — Progress Notes (Signed)
Subjective:    Patient ID: [name], female    DOB: March 06, 1971, 52 y.o.   MRN: [id number]  HPI: [name] is a 52 y.o. female who returns for follow up appointment for chronic pain and medication refill. She states her pain is located in her lower back radiating into her left hip. She rates her pain 10. Her current exercise regime is walking and performing stretching exercises.  Ms. [place] Morphine equivalent is 75.00 MME.She  is also prescribed Alprazolam .We have discussed the black box warning of using opioids and benzodiazepines. I highlighted the dangers of using these drugs together and discussed the adverse events including respiratory suppression, overdose, cognitive impairment and importance of compliance with current regimen. We will continue to monitor and adjust as indicated.  she is being closely monitored and under the care of her psychiatrist.    Last UDS was Performed on 10/13/2022, it was consistent.    Pain Inventory Average Pain 9 Pain Right Now 10 My pain is constant, sharp, dull, stabbing, tingling, and aching  In the last 24 hours, has pain interfered with the following? General activity 9 Relation with others 9 Enjoyment of life 9 What TIME of day is your pain at its worst? morning , daytime, evening, and night Sleep (in general) Poor  Pain is worse with: walking, bending, sitting, standing, and some activites Pain improves with: rest, heat/ice, therapy/exercise, and medication Relief from Meds: 2  Family History  Problem Relation Age of Onset   Hypertension Maternal Grandmother    Anesthesia problems Neg Hx    Social History   Socioeconomic History   Marital status: Divorced    Spouse name: Not on file   Number of children: Not on file   Years of education: Not on file   Highest education level: Not on file  Occupational History   Not on file  Tobacco Use   Smoking status: Never   Smokeless tobacco: Never  Vaping Use   Vaping status:  Never Used  Substance and Sexual Activity   Alcohol use: No   Drug use: No   Sexual activity: Not Currently    Birth control/protection: None    Comment: IUD removed 2 wks ago.  Other Topics Concern   Not on file  Social History Narrative   Not on file   Social Determinants of Health   Financial Resource Strain: Not on file  Food Insecurity: Not on file  Transportation Needs: Not on file  Physical Activity: Not on file  Stress: Not on file  Social Connections: Not on file   Past Surgical History:  Procedure Laterality Date   ABDOMINAL HYSTERECTOMY  06/29/11   Robotic Assisted Hysterectomy   CERVICAL SPINE SURGERY  10   CESAREAN SECTION  1996   LUMBAR LAMINECTOMY  08/31/2011   Procedure: MICRODISCECTOMY LUMBAR LAMINECTOMY;  Surgeon: Jacki Cones, MD;  Location: [facility];  Service: Orthopedics;  Laterality: N/A;  lumbar five sacral one central microdisectomy   MYOMECTOMY  13   NECK SURGERY     POSTERIOR CERVICAL FUSION/FORAMINOTOMY N/A 02/02/2013   Procedure: POSTERIOR C5-6 SPINAL FUSION;  Surgeon: Venita Lick, MD;  Location: [facility];  Service: Orthopedics;  Laterality: N/A;   POSTERIOR FUSION CERVICAL SPINE  02/02/2013   C 5 C6     Dr Shon Baton   WISDOM TOOTH EXTRACTION     Past Surgical History:  Procedure Laterality Date   ABDOMINAL HYSTERECTOMY  06/29/11   Robotic Assisted Hysterectomy   CERVICAL SPINE  SURGERY  10   CESAREAN SECTION  1996   LUMBAR LAMINECTOMY  08/31/2011   Procedure: MICRODISCECTOMY LUMBAR LAMINECTOMY;  Surgeon: Jacki Cones, MD;  Location: [facility];  Service: Orthopedics;  Laterality: N/A;  lumbar five sacral one central microdisectomy   MYOMECTOMY  13   NECK SURGERY     POSTERIOR CERVICAL FUSION/FORAMINOTOMY N/A 02/02/2013   Procedure: POSTERIOR C5-6 SPINAL FUSION;  Surgeon: Venita Lick, MD;  Location: [facility];  Service: Orthopedics;  Laterality: N/A;   POSTERIOR FUSION CERVICAL SPINE  02/02/2013   C 5 C6     Dr Shon Baton   WISDOM TOOTH EXTRACTION      Past Medical History:  Diagnosis Date   Anemia    hx   Arthritis    Complication of anesthesia    slow to wake up,   very difficult to get iv had picc line last time   Difficult intravenous access    hands are usually best for blood draws and IV starts   DVT (deep venous thrombosis) (HCC)    ? vs lupus   Fibroids    Headache(784.0)    Neck pain    Scoliosis    BP 123/88   Pulse 96   Ht 5\' 5"  (1.651 m)   Wt 264 lb (119.7 kg)   LMP 02/27/2011   SpO2 97%   BMI 43.93 kg/m   Opioid Risk Score:   Fall Risk Score:  `1  Depression screen [facility] 2/9     09/22/2022   11:40 AM 07/31/2022   11:48 AM 06/01/2022   11:24 AM 05/19/2022   10:44 AM 03/11/2022   11:19 AM 10/15/2021   11:27 AM 07/08/2021   11:27 AM  Depression screen [facility] 2/9  Decreased Interest 0 0 0 0 0 0 0  Down, Depressed, Hopeless 0 0 0 0 0 0 0  PHQ - 2 Score 0 0 0 0 0 0 0      Review of Systems  Musculoskeletal:  Positive for back pain.       LT hip pain  All other systems reviewed and are negative.      Objective:   Physical Exam Vitals and nursing note reviewed.  Constitutional:      Appearance: Normal appearance.  Cardiovascular:     Rate and Rhythm: Normal rate and regular rhythm.     Pulses: Normal pulses.     Heart sounds: Normal heart sounds.  Pulmonary:     Effort: Pulmonary effort is normal.     Breath sounds: Normal breath sounds.  Musculoskeletal:     Cervical back: Normal range of motion and neck supple.     Comments: Normal Muscle Bulk and Muscle Testing Reveals:  Upper Extremities: Full ROM and Muscle Strength 5/5 Lumbar Paraspinal Tenderness: L-4-L-5 Left Greater Trochanter Tenderness Lower Extremities: Full ROM and Muscle Strength 5/5 Arises from Chair Slowly using cane for support Antalgic  Gait     Skin:    General: Skin is warm and dry.  Neurological:     Mental Status: She is alert and oriented to person, place, and time.  Psychiatric:        Mood and Affect: Mood normal.         Behavior: Behavior normal.         Assessment & Plan:  1. 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 Status post decompressive laminectomy:Continue to Monitor  Refilled: Oxycodone 10mg /325 mg one tablet five times a day as needed #135.  12/07/2022 We will continue the opioid monitoring program, this consists of regular clinic visits, examinations, urine drug screen, pill counts as well as use of [place] Controlled Substance Reporting system. A 12 month History has been reviewed on the [place] Controlled Substance Reporting System on 12/07/2022. 2. Insomnia/ Anxiety: Continue  Xanax to 0.5 mg at HS, educated on the [employer], she verbalizes understanding. 12/07/2022 3. Lumbar Radiculopathy/Neuropathic pain: Continue current medication regimen with Gabapentin. 12/07/2022 4. Muscle Spasms. Continue current medication regimen with: Tizanidine. 12/07/2022 5. Neurogenic bowel and bladder. Continue Bowel Regimen. Continue to monitor. 12/07/2022 6. Constipation/Opioid Therapy: Continue current medication regimen with  Linzess. 12/07/2022 7. Depression: Continue current medication regimen with Celexa. Continue to monitor. 12/07/2022 8. Left  Greater Trochanteric Bursitis:  Continue  Heat/Ice Therapy. 12/07/2022 9. Chronic Pain Syndrome: Continue current medication regimen with Voltaren. 12/07/2022 10. Chronic Right Knee Pain: No complaints today. Continue HEP as Tolerated. Continue current medication regimen and continue to monitor. 12/07/2022 11. Cervicalgia/ Cervical Radiculitis: No complaints today. Continue current medication regimen and HEP as tolerated.. Continue to Monitor. 12/07/2022 .    F/U in 2 months

## 2022-12-10 ENCOUNTER — Encounter (HOSPITAL_BASED_OUTPATIENT_CLINIC_OR_DEPARTMENT_OTHER): Payer: Self-pay

## 2022-12-10 ENCOUNTER — Ambulatory Visit
Admission: EM | Admit: 2022-12-10 | Discharge: 2022-12-10 | Disposition: A | Discharge status: Home / Self Care | Payer: Medicaid Other | Attending: Family Medicine | Admitting: Family Medicine

## 2022-12-10 ENCOUNTER — Emergency Department (HOSPITAL_BASED_OUTPATIENT_CLINIC_OR_DEPARTMENT_OTHER): Payer: Medicaid Other

## 2022-12-10 ENCOUNTER — Other Ambulatory Visit: Payer: Self-pay

## 2022-12-10 ENCOUNTER — Other Ambulatory Visit (HOSPITAL_BASED_OUTPATIENT_CLINIC_OR_DEPARTMENT_OTHER): Payer: Self-pay

## 2022-12-10 ENCOUNTER — Emergency Department (HOSPITAL_BASED_OUTPATIENT_CLINIC_OR_DEPARTMENT_OTHER)
Admission: EM | Admit: 2022-12-10 | Discharge: 2022-12-10 | Disposition: A | Discharge status: Home / Self Care | Payer: Medicaid Other | Attending: Emergency Medicine | Admitting: Emergency Medicine

## 2022-12-10 DIAGNOSIS — R197 Diarrhea, unspecified: Secondary | ICD-10-CM | POA: Insufficient documentation

## 2022-12-10 DIAGNOSIS — R109 Unspecified abdominal pain: Secondary | ICD-10-CM | POA: Diagnosis not present

## 2022-12-10 DIAGNOSIS — K5730 Diverticulosis of large intestine without perforation or abscess without bleeding: Secondary | ICD-10-CM | POA: Diagnosis not present

## 2022-12-10 DIAGNOSIS — R112 Nausea with vomiting, unspecified: Secondary | ICD-10-CM

## 2022-12-10 DIAGNOSIS — R1084 Generalized abdominal pain: Secondary | ICD-10-CM | POA: Diagnosis not present

## 2022-12-10 LAB — CBC
HCT: 39.4 % (ref 36.0–46.0)
Hemoglobin: 12.9 g/dL (ref 12.0–15.0)
MCH: 29.1 pg (ref 26.0–34.0)
MCHC: 32.7 g/dL (ref 30.0–36.0)
MCV: 88.7 fL (ref 80.0–100.0)
Platelets: 238 10*3/uL (ref 150–400)
RBC: 4.44 MIL/uL (ref 3.87–5.11)
RDW: 13.2 % (ref 11.5–15.5)
WBC: 7.6 10*3/uL (ref 4.0–10.5)
nRBC: 0 % (ref 0.0–0.2)

## 2022-12-10 LAB — URINALYSIS, ROUTINE W REFLEX MICROSCOPIC
Bilirubin Urine: NEGATIVE
Glucose, UA: NEGATIVE mg/dL
Hgb urine dipstick: NEGATIVE
Ketones, ur: NEGATIVE mg/dL
Leukocytes,Ua: NEGATIVE
Nitrite: NEGATIVE
Specific Gravity, Urine: 1.021 (ref 1.005–1.030)
pH: 6 (ref 5.0–8.0)

## 2022-12-10 LAB — POCT URINALYSIS DIP (MANUAL ENTRY)
Bilirubin, UA: NEGATIVE
Blood, UA: NEGATIVE
Glucose, UA: NEGATIVE mg/dL
Ketones, POC UA: NEGATIVE mg/dL
Leukocytes, UA: NEGATIVE
Nitrite, UA: NEGATIVE
Protein Ur, POC: NEGATIVE mg/dL
Spec Grav, UA: <=1.005 — AB (ref 1.010–1.025)
Urobilinogen, UA: 0.2 E.U./dL
pH, UA: 5.5 (ref 5.0–8.0)

## 2022-12-10 LAB — COMPREHENSIVE METABOLIC PANEL
ALT: 13 U/L (ref 0–44)
AST: 17 U/L (ref 15–41)
Albumin: 4 g/dL (ref 3.5–5.0)
Alkaline Phosphatase: 82 U/L (ref 38–126)
Anion gap: 9 (ref 5–15)
BUN: 10 mg/dL (ref 6–20)
CO2: 27 mmol/L (ref 22–32)
Calcium: 9.1 mg/dL (ref 8.9–10.3)
Chloride: 103 mmol/L (ref 98–111)
Creatinine, Ser: 0.73 mg/dL (ref 0.44–1.00)
GFR, Estimated: >60 mL/min (ref >60)
Glucose, Bld: 92 mg/dL (ref 70–99)
Potassium: 3.6 mmol/L (ref 3.5–5.1)
Sodium: 139 mmol/L (ref 135–145)
Total Bilirubin: 0.5 mg/dL (ref 0.3–1.2)
Total Protein: 8.5 g/dL — ABNORMAL HIGH (ref 6.5–8.1)

## 2022-12-10 LAB — LIPASE, BLOOD: Lipase: 14 U/L (ref 11–51)

## 2022-12-10 MED ORDER — DIPHENHYDRAMINE HCL 50 MG/ML IJ SOLN
25.0000 mg | Freq: Once | INTRAMUSCULAR | Status: AC
  Administered 2022-12-10: 25 mg via INTRAVENOUS
  Filled 2022-12-10: qty 1

## 2022-12-10 MED ORDER — OXYCODONE-ACETAMINOPHEN 5-325 MG PO TABS
1.0000 | ORAL_TABLET | Freq: Four times a day (QID) | ORAL | 0 refills | Status: DC | PRN
Start: 2022-12-10 — End: 2023-02-09

## 2022-12-10 MED ORDER — FAMOTIDINE 20 MG PO TABS: 20.0000 mg | ORAL_TABLET | Freq: Two times a day (BID) | ORAL | 0 refills | Status: DC

## 2022-12-10 MED ORDER — HYDROMORPHONE HCL 1 MG/ML IJ SOLN
0.5000 mg | Freq: Once | INTRAMUSCULAR | Status: AC
  Administered 2022-12-10: 0.5 mg via INTRAVENOUS
  Filled 2022-12-10: qty 1

## 2022-12-10 MED ORDER — ONDANSETRON HCL 4 MG/2ML IJ SOLN
4.0000 mg | Freq: Once | INTRAMUSCULAR | Status: AC
  Administered 2022-12-10: 4 mg via INTRAVENOUS
  Filled 2022-12-10: qty 2

## 2022-12-10 MED ORDER — ONDANSETRON 4 MG PO TBDP
4.0000 mg | ORAL_TABLET | Freq: Once | ORAL | Status: AC
  Administered 2022-12-10: 4 mg via ORAL

## 2022-12-10 MED ORDER — ONDANSETRON 4 MG PO TBDP: 4.0000 mg | ORAL_TABLET | Freq: Three times a day (TID) | ORAL | 0 refills | Status: AC | PRN

## 2022-12-10 MED ORDER — KETOROLAC TROMETHAMINE 30 MG/ML IJ SOLN
30.0000 mg | Freq: Once | INTRAMUSCULAR | Status: AC
  Administered 2022-12-10: 30 mg via INTRAVENOUS
  Filled 2022-12-10: qty 1

## 2022-12-10 NOTE — ED Provider Notes (Signed)
[facility]    CSN: [id number] Arrival date & time: 12/10/22  5366      History   Chief Complaint Chief Complaint  Patient presents with   Abdominal Pain   Emesis   Diarrhea    HPI [name] is a 52 y.o. female.    Abdominal Pain Associated symptoms: diarrhea, fatigue and vomiting   Emesis Associated symptoms: abdominal pain and diarrhea   Diarrhea Associated symptoms: abdominal pain and vomiting    She has been having 1 week of vomiting, diarrhea.  Fatigued.  Stomach is very painful, across the whole lower abdomen.  Cramping/soreness.  10/10 in pain.   Comes and goes.  Having chills, no fever.  Not able to keep anything down.  She had an egg, bacon, pancake  last night and vomiting that up.  She is urinating okay.  No other sick contacts.  No prior h/o this before.  She has a hemorrhoid, and that is causing some bleeding.  No other blood noted.       Past Medical History:  Diagnosis Date   Anemia    hx   Arthritis    Complication of anesthesia    slow to wake up,   very difficult to get iv had picc line last time   Difficult intravenous access    hands are usually best for blood draws and IV starts   DVT (deep venous thrombosis) (HCC)    ? vs lupus   Fibroids    Headache(784.0)    Neck pain    Scoliosis     Patient Active Problem List   Diagnosis Date Noted   Allergy to food 10/02/2022   Abnormal glucose level 10/02/2022   Abnormal weight gain 10/02/2022   Flatulence, eructation and gas pain 10/02/2022   Neck pain 10/02/2022   Rectal bleeding 10/02/2022   Systemic lupus erythematosus (HCC) 10/02/2022   Obesity with body mass index 30 or greater 10/02/2022   Morbid obesity (HCC) 10/02/2022   Greater trochanteric bursitis of right hip 02/12/2015   Metabolic syndrome 08/27/2014   Other constipation 05/24/2014   Greater trochanteric bursitis of left hip 09/08/2013   UTI (urinary tract infection) 09/03/2011   Hypokalemia  09/03/2011   Cauda equina syndrome (HCC) 09/03/2011   Spinal stenosis of lumbar region 08/31/2011   Lumbar herniated disc 08/31/2011   Abdominal pain, acute, generalized 08/28/2011   Nausea and vomiting 08/28/2011   Dehydration 08/28/2011   S/P hysterectomy 4/13 08/28/2011   Menometrorrhagia 05/21/2011   DVT of right leg (deep venous thrombosis) in 01/2011 05/21/2011   Fibroid uterus 03/20/2011   LBP (low back pain) 05/29/2010   Avitaminosis D 04/23/2010   Vitamin D deficiency 04/23/2010   Benign essential HTN 04/21/2010   Essential (primary) hypertension 04/21/2010   Allergic rhinitis 11/01/2009   Absolute anemia 11/01/2009   Headache, migraine 11/01/2009   Adiposity 11/01/2009   Anemia, unspecified 11/01/2009   Obesity, unspecified 11/01/2009    Past Surgical History:  Procedure Laterality Date   ABDOMINAL HYSTERECTOMY  06/29/11   Robotic Assisted Hysterectomy   CERVICAL SPINE SURGERY  10   CESAREAN SECTION  1996   LUMBAR LAMINECTOMY  08/31/2011   Procedure: MICRODISCECTOMY LUMBAR LAMINECTOMY;  Surgeon: Jacki Cones, MD;  Location: [facility];  Service: Orthopedics;  Laterality: N/A;  lumbar five sacral one central microdisectomy   MYOMECTOMY  13   NECK SURGERY     POSTERIOR CERVICAL FUSION/FORAMINOTOMY N/A 02/02/2013   Procedure: POSTERIOR C5-6 SPINAL FUSION;  Surgeon: Venita Lick, MD;  Location: [facility];  Service: Orthopedics;  Laterality: N/A;   POSTERIOR FUSION CERVICAL SPINE  02/02/2013   C 5 C6     Dr Shon Baton   WISDOM TOOTH EXTRACTION      OB History     Gravida  2   Para  1   Term  1   Preterm  0   AB  1   Living  1      SAB  1   IAB  0   Ectopic  0   Multiple  0   Live Births               Home Medications    Prior to Admission medications   Medication Sig Start Date End Date Taking? Authorizing Provider  phentermine 37.5 MG capsule Take 1 capsule by mouth every morning. 10/23/22  Yes [provider]  ALPRAZolam Prudy Feeler) 0.5 MG  tablet Take 1 tablet by mouth daily as needed.    [provider]  benzonatate (TESSALON) 100 MG capsule Take 1 capsule by mouth every 8 (eight) hours.    [provider]  cetirizine (ZYRTEC) 10 MG tablet Take 1 tablet (10 mg total) by mouth daily. 05/22/14   Massie Maroon, FNP  Cholecalciferol (VITAMIN D-3 PO) Take 1 capsule by mouth daily. 05/25/22   [provider]  citalopram (CELEXA) 10 MG tablet Take 1 tablet by mouth daily.    [provider]  diclofenac (VOLTAREN) 75 MG EC tablet Take 1 tablet by mouth 2 (two) times daily.    [provider]  diclofenac (VOLTAREN) 75 MG EC tablet Take 1 tablet (75 mg total) by mouth 2 (two) times daily with a meal. 10/13/22   Jones Bales, NP  docusate sodium (COLACE) 100 MG capsule     [provider]  Docusate Sodium (STOOL SOFTENER) 150 MG/15ML syrup     [provider]  ergocalciferol (DRISDOL) 50000 UNITS capsule Take 1 capsule (50,000 Units total) by mouth once a week. 05/30/14   Massie Maroon, FNP  gabapentin (NEURONTIN) 300 MG capsule TAKE 1 CAPSULE BY MOUTH IN THE MORNING AND AFTERNOON,DINNER AND 2 CAPSULES AT BEDTIME 10/13/22   Jones Bales, NP  hydrocortisone (PROCTO-MED [facility]) 2.5 % rectal cream     [provider]  hydroxychloroquine (PLAQUENIL) 200 MG tablet  09/14/17   [provider]  linaclotide Karlene Einstein) 290 MCG CAPS capsule     [provider]  linaclotide Karlene Einstein) 290 MCG CAPS capsule  04/17/13   [provider]  LINZESS 145 MCG CAPS capsule Take 145 mcg by mouth daily. 09/16/20   [provider]  ondansetron (ZOFRAN-ODT) 8 MG disintegrating tablet ondansetron 8 mg disintegrating tablet  LET 1 TABLET DISSOLVE ON TOP OF THE TONGUE TWICE A DAY AS NEEDED    [provider]  oxyCODONE-acetaminophen (PERCOCET) 10-325 MG tablet Take 1 tablet (10/325 mg total) by mouth 5 (five) times daily as needed. 12/07/22   Jones Bales, NP  phentermine 37.5 MG capsule Take 37.5 mg by mouth every morning.    [provider]  predniSONE (DELTASONE) 20 MG tablet Take 2 tablets by mouth daily.    [provider]  promethazine (PHENERGAN) 12.5 MG tablet Take 12.5 mg by mouth every 6 (six) hours as needed for nausea.    [provider]  scopolamine (TRANSDERM-SCOP) 1 MG/3DAYS  03/12/20   [provider]  Semaglutide-Weight Management (WEGOVY) 0.5  MG/0.5ML SOAJ Inject 0.5 mg into the skin once a week. 05/27/22   [provider]  sulfamethoxazole-trimethoprim (BACTRIM DS) 800-160 MG tablet Take 1 tablet by mouth 2 (two) times daily.    [provider]  tirzepatide Greggory Keen) 5 MG/0.5ML Pen     [provider]  tiZANidine (ZANAFLEX) 4 MG tablet TAKE 1 TO 2 TABLETS BY MOUTH EVERY 8 HOURS AS NEEDED FOR MUSCLE SPASM 10/13/22   Jones Bales, NP  oxyCODONE-acetaminophen (PERCOCET) 10-325 MG tablet Take 1 tablet (10/325 mg total) by mouth 5 (five) times daily as needed. 12/30/20   Jones Bales, NP    Family History Family History  Problem Relation Age of Onset   Hypertension Maternal Grandmother    Anesthesia problems Neg Hx     Social History Social History   Tobacco Use   Smoking status: Never   Smokeless tobacco: Never  Vaping Use   Vaping status: Never Used  Substance Use Topics   Alcohol use: No   Drug use: No     Allergies   Penicillins, Shellfish allergy, Codeine, Iodine, Iodinated contrast media, and Meperidine and related   Review of Systems Review of Systems  Constitutional:  Positive for fatigue.  HENT: Negative.    Gastrointestinal:  Positive for abdominal pain, diarrhea and vomiting.  Musculoskeletal: Negative.   Skin: Negative.   Psychiatric/Behavioral: Negative.       Physical Exam Triage Vital Signs ED Triage Vitals  Encounter Vitals Group     BP 12/10/22 0942 127/87     Systolic BP Percentile --      Diastolic BP Percentile  --      Pulse Rate 12/10/22 0942 89     Resp 12/10/22 0942 18     Temp 12/10/22 0942 98.5 F (36.9 C)     Temp Source 12/10/22 0942 Oral     SpO2 12/10/22 0942 96 %     Weight 12/10/22 0941 260 lb (117.9 kg)     Height 12/10/22 0941 5\' 5"  (1.651 m)     Head Circumference --      Peak Flow --      Pain Score 12/10/22 0938 0     Pain Loc --      Pain Education --      Exclude from Growth Chart --    No data found.  Updated Vital Signs BP 127/87 (BP Location: Left Arm)   Pulse 89   Temp 98.5 F (36.9 C) (Oral)   Resp 18   Ht 5\' 5"  (1.651 m)   Wt 117.9 kg   LMP 02/27/2011   SpO2 96%   BMI 43.27 kg/m   Visual Acuity Right Eye Distance:   Left Eye Distance:   Bilateral Distance:    Right Eye Near:   Left Eye Near:    Bilateral Near:     Physical Exam Constitutional:      General: She is not in acute distress.    Appearance: She is well-developed. She is not ill-appearing.  Cardiovascular:     Rate and Rhythm: Normal rate and regular rhythm.  Pulmonary:     Effort: Pulmonary effort is normal.     Breath sounds: Normal breath sounds.  Abdominal:     General: Bowel sounds are increased.     Palpations: Abdomen is soft.     Tenderness: There is generalized abdominal tenderness. There is guarding. There is no rebound.  Neurological:     General: No focal deficit present.  Mental Status: She is alert.  Psychiatric:        Mood and Affect: Mood normal.      UC Treatments / Results  Labs (all labs ordered are listed, but only abnormal results are displayed) Labs Reviewed  POCT URINALYSIS DIP (MANUAL ENTRY) - Abnormal; Notable for the following components:      Result Value   Color, UA light yellow (*)    Spec Grav, UA <=1.005 (*)    All other components within normal limits    EKG   Radiology No results found.  Procedures Procedures (including critical care time)  Medications Ordered in UC Medications  ondansetron (ZOFRAN-ODT) disintegrating  tablet 4 mg (4 mg Oral Given 12/10/22 0946)    Initial Impression / Assessment and Plan / UC Course  I have reviewed the triage vital signs and the nursing notes.  Pertinent labs & imaging results that were available during my care of the patient were reviewed by me and considered in my medical decision making (see chart for details).   Final Clinical Impressions(s) / UC Diagnoses   Final diagnoses:  Abdominal pain, unspecified abdominal location  Diarrhea, unspecified type  Nausea and vomiting, unspecified vomiting type     Discharge Instructions      You were seen today for abdominal pain, nausea and vomiting.  Given the severity of pain I recommend you go to the ER for evaluation.      ED Prescriptions   None    PDMP not reviewed this encounter.   Jannifer Franklin, MD 12/10/22 1013

## 2022-12-10 NOTE — ED Provider Notes (Signed)
[place] EMERGENCY DEPARTMENT AT [facility] Provider Note   CSN: [id number] Arrival date & time: 12/10/22  1043     History  Chief Complaint  Patient presents with   Abdominal Pain   Diarrhea    [name] is a 52 y.o. female with a past medical history of anemia, DVT, arthritis presents today for evaluation of abdominal pain.  Patient reports increased abdominal pain in the last 7 days.  She reports vomiting and diarrhea as well.  Pain is all over her abdomen, intermittent.  History of total hysterectomy.  She denies any fever, chest pain, shortness of breath, urinary symptoms, vaginal bleeding, abnormal vaginal discharge, rash.  Last bowel movement was yesterday with loose stool.   Abdominal Pain Associated symptoms: diarrhea   Diarrhea Associated symptoms: abdominal pain     Past Medical History:  Diagnosis Date   Anemia    hx   Arthritis    Complication of anesthesia    slow to wake up,   very difficult to get iv had picc line last time   Difficult intravenous access    hands are usually best for blood draws and IV starts   DVT (deep venous thrombosis) (HCC)    ? vs lupus   Fibroids    Headache(784.0)    Neck pain    Scoliosis    Past Surgical History:  Procedure Laterality Date   ABDOMINAL HYSTERECTOMY  06/29/11   Robotic Assisted Hysterectomy   CERVICAL SPINE SURGERY  10   CESAREAN SECTION  1996   LUMBAR LAMINECTOMY  08/31/2011   Procedure: MICRODISCECTOMY LUMBAR LAMINECTOMY;  Surgeon: Jacki Cones, MD;  Location: [facility];  Service: Orthopedics;  Laterality: N/A;  lumbar five sacral one central microdisectomy   MYOMECTOMY  13   NECK SURGERY     POSTERIOR CERVICAL FUSION/FORAMINOTOMY N/A 02/02/2013   Procedure: POSTERIOR C5-6 SPINAL FUSION;  Surgeon: Venita Lick, MD;  Location: [facility];  Service: Orthopedics;  Laterality: N/A;   POSTERIOR FUSION CERVICAL SPINE  02/02/2013   C 5 C6     Dr Shon Baton   WISDOM TOOTH EXTRACTION       Home  Medications Prior to Admission medications   Medication Sig Start Date End Date Taking? Authorizing Provider  ALPRAZolam Prudy Feeler) 0.5 MG tablet Take 1 tablet by mouth daily as needed.    [provider]  benzonatate (TESSALON) 100 MG capsule Take 1 capsule by mouth every 8 (eight) hours.    [provider]  cetirizine (ZYRTEC) 10 MG tablet Take 1 tablet (10 mg total) by mouth daily. 05/22/14   Massie Maroon, FNP  Cholecalciferol (VITAMIN D-3 PO) Take 1 capsule by mouth daily. 05/25/22   [provider]  citalopram (CELEXA) 10 MG tablet Take 1 tablet by mouth daily.    [provider]  diclofenac (VOLTAREN) 75 MG EC tablet Take 1 tablet by mouth 2 (two) times daily.    [provider]  diclofenac (VOLTAREN) 75 MG EC tablet Take 1 tablet (75 mg total) by mouth 2 (two) times daily with a meal. 10/13/22   Jones Bales, NP  docusate sodium (COLACE) 100 MG capsule     [provider]  Docusate Sodium (STOOL SOFTENER) 150 MG/15ML syrup     [provider]  ergocalciferol (DRISDOL) 50000 UNITS capsule Take 1 capsule (50,000 Units total) by mouth once a week. 05/30/14   Massie Maroon, FNP  gabapentin (NEURONTIN) 300 MG capsule TAKE 1 CAPSULE BY MOUTH  IN THE MORNING AND AFTERNOON,DINNER AND 2 CAPSULES AT BEDTIME 10/13/22   Jones Bales, NP  hydrocortisone (PROCTO-MED [facility]) 2.5 % rectal cream     [provider]  hydroxychloroquine (PLAQUENIL) 200 MG tablet  09/14/17   [provider]  linaclotide Karlene Einstein) 290 MCG CAPS capsule     [provider]  linaclotide Karlene Einstein) 290 MCG CAPS capsule  04/17/13   [provider]  LINZESS 145 MCG CAPS capsule Take 145 mcg by mouth daily. 09/16/20   [provider]  ondansetron (ZOFRAN-ODT) 8 MG disintegrating tablet ondansetron 8 mg disintegrating tablet  LET 1 TABLET DISSOLVE ON TOP OF THE TONGUE TWICE A DAY AS NEEDED    [provider]   oxyCODONE-acetaminophen (PERCOCET) 10-325 MG tablet Take 1 tablet (10/325 mg total) by mouth 5 (five) times daily as needed. 12/07/22   Jones Bales, NP  phentermine 37.5 MG capsule Take 37.5 mg by mouth every morning.    [provider]  phentermine 37.5 MG capsule Take 1 capsule by mouth every morning. 10/23/22   [provider]  predniSONE (DELTASONE) 20 MG tablet Take 2 tablets by mouth daily.    [provider]  promethazine (PHENERGAN) 12.5 MG tablet Take 12.5 mg by mouth every 6 (six) hours as needed for nausea.    [provider]  scopolamine (TRANSDERM-SCOP) 1 MG/3DAYS  03/12/20   [provider]  Semaglutide-Weight Management (WEGOVY) 0.5 MG/0.5ML SOAJ Inject 0.5 mg into the skin once a week. 05/27/22   [provider]  sulfamethoxazole-trimethoprim (BACTRIM DS) 800-160 MG tablet Take 1 tablet by mouth 2 (two) times daily.    [provider]  tirzepatide Greggory Keen) 5 MG/0.5ML Pen     [provider]  tiZANidine (ZANAFLEX) 4 MG tablet TAKE 1 TO 2 TABLETS BY MOUTH EVERY 8 HOURS AS NEEDED FOR MUSCLE SPASM 10/13/22   Jones Bales, NP  oxyCODONE-acetaminophen (PERCOCET) 10-325 MG tablet Take 1 tablet (10/325 mg total) by mouth 5 (five) times daily as needed. 12/30/20   Jones Bales, NP      Allergies    Penicillins, Shellfish allergy, Codeine, Iodine, Iodinated contrast media, and Meperidine and related    Review of Systems   Review of Systems  Gastrointestinal:  Positive for abdominal pain and diarrhea.    Physical Exam Updated Vital Signs BP 129/76   Pulse 82   Temp 97.9 F (36.6 C) (Temporal)   Resp 20   Ht 5\' 5"  (1.651 m)   Wt 117.9 kg   LMP 02/27/2011   SpO2 97%   BMI 43.25 kg/m  Physical Exam Vitals and nursing note reviewed.  Constitutional:      Appearance: Normal appearance.  HENT:     Head: Normocephalic and atraumatic.     Mouth/Throat:     Mouth: Mucous membranes are moist.   Eyes:     General: No scleral icterus. Cardiovascular:     Rate and Rhythm: Normal rate and regular rhythm.     Pulses: Normal pulses.     Heart sounds: Normal heart sounds.  Pulmonary:     Effort: Pulmonary effort is normal.     Breath sounds: Normal breath sounds.  Abdominal:     General: Abdomen is flat.     Palpations: Abdomen is soft.     Tenderness: There is generalized abdominal tenderness.  Musculoskeletal:        General: No deformity.  Skin:    General: Skin is warm.  Findings: No rash.  Neurological:     General: No focal deficit present.     Mental Status: She is alert.  Psychiatric:        Mood and Affect: Mood normal.     ED Results / Procedures / Treatments   Labs (all labs ordered are listed, but only abnormal results are displayed) Labs Reviewed  COMPREHENSIVE METABOLIC PANEL - Abnormal; Notable for the following components:      Result Value   Total Protein 8.5 (*)    All other components within normal limits  URINALYSIS, ROUTINE W REFLEX MICROSCOPIC - Abnormal; Notable for the following components:   Protein, ur TRACE (*)    All other components within normal limits  LIPASE, BLOOD  CBC    EKG None  Radiology No results found.  Procedures Procedures    Medications Ordered in ED Medications  HYDROmorphone (DILAUDID) injection 0.5 mg (has no administration in time range)  ondansetron (ZOFRAN) injection 4 mg (has no administration in time range)    ED Course/ Medical Decision Making/ A&P                                 Medical Decision Making Amount and/or Complexity of Data Reviewed Labs: ordered. Radiology: ordered.  Risk Prescription drug management.   This patient presents to the ED for abdominal pain, this involves an extensive number of treatment options, and is a complaint that carries with a high risk of complications and morbidity.  The differential diagnosis includes diverticulitis, hernia, diverticulitis, UTI,  constipation, ectopic pregnancy, ovarian torsion, ovarian cyst, PID/TOA, period/fibroid..  This is not an exhaustive list.  Lab tests: I ordered and personally interpreted labs.  The pertinent results include: WBC unremarkable. Hbg unremarkable. Platelets unremarkable. Electrolytes unremarkable. BUN, creatinine unremarkable.  Lipase is normal.  UA is normal.  Imaging studies: I ordered imaging studies, personally reviewed, interpreted imaging and agree with the radiologist's interpretations. The results include: CT abdomen pelvis with no acute intraabdominal or pelvic abnormality.  Problem list/ ED course/ Critical interventions/ Medical management: HPI: See above Vital signs within normal range and stable throughout visit. Laboratory/imaging studies significant for: See above. On physical examination, patient is afebrile and appears in no acute distress. Patient presents with generalized abdominal pain. Abdominal exam without peritoneal signs. No evidence of acute abdomen at this time. Well appearing. Given work up low suspicion for acute hepatobiliary disease (including acute cholecystitis), acute pancreatitis (neg lipase), PUD and gastric perforation,  acute appendicitis, bowel obstruction, diverticulitis. Presentation not consistent with other acute, emergent causes of abdominal pain at this time.  Given Dilaudid and Toradol for pain, Zofran for nausea.  Reevaluation of patient after this medication showed patient improved.  Patient is able to tolerate p.o. here. Advised patient to take Tylenol/ibuprofen/naproxen for pain, follow-up with primary care physician for further evaluation and management, return to the ER if new or worsening symptoms. I have reviewed the patient home medicines and have made adjustments as needed.  Cardiac monitoring/EKG: The patient was maintained on a cardiac monitor.  I personally reviewed and interpreted the cardiac monitor which showed an underlying rhythm of:  sinus rhythm.  Additional history obtained: External records from outside source obtained and reviewed including: Chart review including previous notes, labs, imaging.  Consultations obtained:  Disposition Continued outpatient therapy. Follow-up with PCP recommended for reevaluation of symptoms. Treatment plan discussed with patient.  Pt acknowledged understanding was agreeable to the  plan. Worrisome signs and symptoms were discussed with patient, and patient acknowledged understanding to return to the ED if they noticed these signs and symptoms. Patient was stable upon discharge.   This chart was dictated using voice recognition software.  Despite best efforts to proofread,  errors can occur which can change the documentation meaning.          Final Clinical Impression(s) / ED Diagnoses Final diagnoses:  Generalized abdominal pain    Rx / DC Orders ED Discharge Orders          Ordered    oxyCODONE-acetaminophen (PERCOCET/ROXICET) 5-325 MG tablet  Every 6 hours PRN        12/10/22 1613    famotidine (PEPCID) 20 MG tablet  2 times daily        12/10/22 1613    ondansetron (ZOFRAN-ODT) 4 MG disintegrating tablet  Every 8 hours PRN        12/10/22 1613              Jeanelle Malling, PA 12/10/22 1627    Cathren Laine, MD 12/15/22 [id number]

## 2022-12-10 NOTE — Discharge Instructions (Addendum)
Your CT scan today was unremarkable. Please take tylenol/ibuprofen/naproxen for pain. I recommend close follow-up with PCP for reevaluation.  Please do not hesitate to return to emergency department if worrisome signs symptoms we discussed become apparent.

## 2022-12-10 NOTE — ED Triage Notes (Signed)
Patient arrives with complaints of worsening abdominal pain x1 week. She reports vomiting and diarrhea as well. Rates pain a 10/10. Sent here by UC for CT scan and further workup.

## 2022-12-10 NOTE — Discharge Instructions (Signed)
You were seen today for abdominal pain, nausea and vomiting.  Given the severity of pain I recommend you go to the ER for evaluation.

## 2022-12-10 NOTE — ED Notes (Signed)
Dc instructions reviewed with patient. Patient voiced understanding. Dc with belongings.  °

## 2022-12-10 NOTE — ED Triage Notes (Signed)
"  Started last Wednesday with vomiting, diarrhea and abd pain/upset". "Now with burps and hiccups that smells like rotten eggs". "My stomach is super upset and sore". Last emesis "920am this morning". Last loose stool "this morning". Last void "around 0900am today but less". No dysuria. No urinary problems.

## 2022-12-15 ENCOUNTER — Encounter: Payer: Medicaid Other | Admitting: Registered Nurse

## 2022-12-17 ENCOUNTER — Telehealth: Payer: Self-pay

## 2022-12-17 NOTE — Telephone Encounter (Signed)
PA submitted for Oxycodone/APAP

## 2023-02-09 ENCOUNTER — Encounter: Payer: Medicaid Other | Attending: Physical Medicine and Rehabilitation | Admitting: Registered Nurse

## 2023-02-09 VITALS — BP 143/94 | HR 98 | Ht 65.0 in | Wt 262.0 lb

## 2023-02-09 DIAGNOSIS — Z5181 Encounter for therapeutic drug level monitoring: Secondary | ICD-10-CM | POA: Insufficient documentation

## 2023-02-09 DIAGNOSIS — M7061 Trochanteric bursitis, right hip: Secondary | ICD-10-CM | POA: Diagnosis not present

## 2023-02-09 DIAGNOSIS — G894 Chronic pain syndrome: Secondary | ICD-10-CM | POA: Insufficient documentation

## 2023-02-09 DIAGNOSIS — Z79891 Long term (current) use of opiate analgesic: Secondary | ICD-10-CM | POA: Diagnosis not present

## 2023-02-09 DIAGNOSIS — M7062 Trochanteric bursitis, left hip: Secondary | ICD-10-CM | POA: Insufficient documentation

## 2023-02-09 DIAGNOSIS — M5416 Radiculopathy, lumbar region: Secondary | ICD-10-CM | POA: Insufficient documentation

## 2023-02-09 DIAGNOSIS — G834 Cauda equina syndrome: Secondary | ICD-10-CM | POA: Diagnosis not present

## 2023-02-09 MED ORDER — DICLOFENAC SODIUM 75 MG PO TBEC: 75.0000 mg | DELAYED_RELEASE_TABLET | Freq: Two times a day (BID) | ORAL | 3 refills | Status: DC

## 2023-02-09 MED ORDER — GABAPENTIN 300 MG PO CAPS
ORAL_CAPSULE | ORAL | 3 refills | Status: DC
Start: 2023-02-09 — End: 2023-06-09

## 2023-02-09 MED ORDER — LINACLOTIDE 290 MCG PO CAPS: 290.0000 ug | ORAL_CAPSULE | Freq: Every day | ORAL | 3 refills | Status: DC

## 2023-02-09 MED ORDER — OXYCODONE-ACETAMINOPHEN 10-325 MG PO TABS: ORAL_TABLET | ORAL | 0 refills | Status: DC

## 2023-02-09 NOTE — Progress Notes (Unsigned)
Subjective:    Patient ID: [name], female    DOB: March 18, 1971, 52 y.o.   MRN: [id number]  HPI: [name] is a 52 y.o. female who returns for follow up appointment for chronic pain and medication refill. states *** pain is located in  ***. rates pain ***. current exercise regime is walking and performing stretching exercises.   [name] Morphine equivalent is *** MME.   UDS ordered today.    Pain Inventory Average Pain 9 Pain Right Now 9 My pain is sharp, dull, tingling, and aching  In the last 24 hours, has pain interfered with the following? General activity 9 Relation with others 9 Enjoyment of life 9 What TIME of day is your pain at its worst? morning , daytime, evening, and night Sleep (in general) Poor  Pain is worse with: walking, bending, and standing Pain improves with: rest, heat/ice, therapy/exercise, and medication Relief from Meds: 2  Family History  Problem Relation Age of Onset   Hypertension Maternal Grandmother    Anesthesia problems Neg Hx    Social History   Socioeconomic History   Marital status: Divorced    Spouse name: Not on file   Number of children: Not on file   Years of education: Not on file   Highest education level: Not on file  Occupational History   Not on file  Tobacco Use   Smoking status: Never   Smokeless tobacco: Never  Vaping Use   Vaping status: Never Used  Substance and Sexual Activity   Alcohol use: No   Drug use: No   Sexual activity: Not Currently    Birth control/protection: None    Comment: IUD removed 2 wks ago.  Other Topics Concern   Not on file  Social History Narrative   Not on file   Social Determinants of Health   Financial Resource Strain: Not on file  Food Insecurity: Not on file  Transportation Needs: Not on file  Physical Activity: Not on file  Stress: Not on file  Social Connections: Not on file   Past Surgical History:  Procedure Laterality Date   ABDOMINAL HYSTERECTOMY  06/29/11    Robotic Assisted Hysterectomy   CERVICAL SPINE SURGERY  10   CESAREAN SECTION  1996   LUMBAR LAMINECTOMY  08/31/2011   Procedure: MICRODISCECTOMY LUMBAR LAMINECTOMY;  Surgeon: Jacki Cones, MD;  Location: [facility];  Service: Orthopedics;  Laterality: N/A;  lumbar five sacral one central microdisectomy   MYOMECTOMY  13   NECK SURGERY     POSTERIOR CERVICAL FUSION/FORAMINOTOMY N/A 02/02/2013   Procedure: POSTERIOR C5-6 SPINAL FUSION;  Surgeon: Venita Lick, MD;  Location: [facility];  Service: Orthopedics;  Laterality: N/A;   POSTERIOR FUSION CERVICAL SPINE  02/02/2013   C 5 C6     Dr Shon Baton   WISDOM TOOTH EXTRACTION     Past Surgical History:  Procedure Laterality Date   ABDOMINAL HYSTERECTOMY  06/29/11   Robotic Assisted Hysterectomy   CERVICAL SPINE SURGERY  10   CESAREAN SECTION  1996   LUMBAR LAMINECTOMY  08/31/2011   Procedure: MICRODISCECTOMY LUMBAR LAMINECTOMY;  Surgeon: Jacki Cones, MD;  Location: [facility];  Service: Orthopedics;  Laterality: N/A;  lumbar five sacral one central microdisectomy   MYOMECTOMY  13   NECK SURGERY     POSTERIOR CERVICAL FUSION/FORAMINOTOMY N/A 02/02/2013   Procedure: POSTERIOR C5-6 SPINAL FUSION;  Surgeon: Venita Lick, MD;  Location: [facility];  Service: Orthopedics;  Laterality: N/A;   POSTERIOR  FUSION CERVICAL SPINE  02/02/2013   C 5 C6     Dr Shon Baton   WISDOM TOOTH EXTRACTION     Past Medical History:  Diagnosis Date   Anemia    hx   Arthritis    Complication of anesthesia    slow to wake up,   very difficult to get iv had picc line last time   Difficult intravenous access    hands are usually best for blood draws and IV starts   DVT (deep venous thrombosis) (HCC)    ? vs lupus   Fibroids    Headache(784.0)    Neck pain    Scoliosis    BP (!) 143/94   Pulse 98   Ht 5\' 5"  (1.651 m)   Wt 262 lb (118.8 kg)   LMP 02/27/2011   SpO2 98%   BMI 43.60 kg/m   Opioid Risk Score:   Fall Risk Score:  `1  Depression screen [facility] 2/9      12/07/2022   11:40 AM 09/22/2022   11:40 AM 07/31/2022   11:48 AM 06/01/2022   11:24 AM 05/19/2022   10:44 AM 03/11/2022   11:19 AM 10/15/2021   11:27 AM  Depression screen [facility] 2/9  Decreased Interest 0 0 0 0 0 0 0  Down, Depressed, Hopeless 0 0 0 0 0 0 0  PHQ - 2 Score 0 0 0 0 0 0 0      Review of Systems     Objective:   Physical Exam        Assessment & Plan:  1. 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 Status post decompressive laminectomy:Continue to Monitor  Refilled: Oxycodone 10mg /325 mg one tablet five times a day as needed #135.  12/07/2022 We will continue the opioid monitoring program, this consists of regular clinic visits, examinations, urine drug screen, pill counts as well as use of [place] Controlled Substance Reporting system. A 12 month History has been reviewed on the [place] Controlled Substance Reporting System on 12/07/2022. 2. Insomnia/ Anxiety: Continue  Xanax to 0.5 mg at HS, educated on the [employer], she verbalizes understanding. 12/07/2022 3. Lumbar Radiculopathy/Neuropathic pain: Continue current medication regimen with Gabapentin. 12/07/2022 4. Muscle Spasms. Continue current medication regimen with: Tizanidine. 12/07/2022 5. Neurogenic bowel and bladder. Continue Bowel Regimen. Continue to monitor. 12/07/2022 6. Constipation/Opioid Therapy: Continue current medication regimen with  Linzess. 12/07/2022 7. Depression: Continue current medication regimen with Celexa. Continue to monitor. 12/07/2022 8. Left  Greater Trochanteric Bursitis:  Continue  Heat/Ice Therapy. 12/07/2022 9. Chronic Pain Syndrome: Continue current medication regimen with Voltaren. 12/07/2022 10. Chronic Right Knee Pain: No complaints today. Continue HEP as Tolerated. Continue current medication regimen and continue to monitor. 12/07/2022 11. Cervicalgia/ Cervical Radiculitis: No complaints today. Continue current medication regimen  and HEP as tolerated.. Continue to Monitor. 12/07/2022 .    F/U in 2 months

## 2023-02-11 ENCOUNTER — Encounter: Payer: Self-pay | Admitting: Registered Nurse

## 2023-02-16 LAB — DRUG TOX MONITOR 1 W/CONF, ORAL FLD
Amphetamines: NEGATIVE ng/mL (ref <10)
Barbiturates: NEGATIVE ng/mL (ref <10)
Benzodiazepines: NEGATIVE ng/mL (ref <0.50)
Buprenorphine: NEGATIVE ng/mL (ref <0.10)
Cocaine: NEGATIVE ng/mL (ref <5.0)
Codeine: NEGATIVE ng/mL (ref <2.5)
Dihydrocodeine: NEGATIVE ng/mL (ref <2.5)
Fentanyl: NEGATIVE ng/mL (ref <0.10)
Heroin Metabolite: NEGATIVE ng/mL (ref <1.0)
Hydrocodone: NEGATIVE ng/mL (ref <2.5)
Hydromorphone: NEGATIVE ng/mL (ref <2.5)
MARIJUANA: NEGATIVE ng/mL (ref <2.5)
MDMA: NEGATIVE ng/mL (ref <10)
Meprobamate: NEGATIVE ng/mL (ref <2.5)
Methadone: NEGATIVE ng/mL (ref <5.0)
Morphine: NEGATIVE ng/mL (ref <2.5)
Nicotine Metabolite: NEGATIVE ng/mL (ref <5.0)
Norhydrocodone: NEGATIVE ng/mL (ref <2.5)
Noroxycodone: NEGATIVE ng/mL (ref <2.5)
Opiates: POSITIVE ng/mL — AB (ref <2.5)
Oxycodone: 3.6 ng/mL — ABNORMAL HIGH (ref <2.5)
Oxymorphone: NEGATIVE ng/mL (ref <2.5)
Phencyclidine: NEGATIVE ng/mL (ref <10)
Tapentadol: NEGATIVE ng/mL (ref <5.0)
Tramadol: NEGATIVE ng/mL (ref <5.0)
Zolpidem: NEGATIVE ng/mL (ref <5.0)

## 2023-02-16 LAB — DRUG TOX ALC METAB W/CON, ORAL FLD: Alcohol Metabolite: NEGATIVE ng/mL (ref <25)

## 2023-04-13 ENCOUNTER — Encounter: Payer: Medicaid Other | Attending: Physical Medicine and Rehabilitation | Admitting: Registered Nurse

## 2023-04-13 ENCOUNTER — Encounter: Payer: Self-pay | Admitting: Registered Nurse

## 2023-04-13 VITALS — BP 147/94 | HR 94 | Ht 65.0 in

## 2023-04-13 DIAGNOSIS — G8929 Other chronic pain: Secondary | ICD-10-CM | POA: Insufficient documentation

## 2023-04-13 DIAGNOSIS — Z79891 Long term (current) use of opiate analgesic: Secondary | ICD-10-CM | POA: Insufficient documentation

## 2023-04-13 DIAGNOSIS — G834 Cauda equina syndrome: Secondary | ICD-10-CM | POA: Diagnosis not present

## 2023-04-13 DIAGNOSIS — M7061 Trochanteric bursitis, right hip: Secondary | ICD-10-CM | POA: Diagnosis not present

## 2023-04-13 DIAGNOSIS — G894 Chronic pain syndrome: Secondary | ICD-10-CM | POA: Insufficient documentation

## 2023-04-13 DIAGNOSIS — M7062 Trochanteric bursitis, left hip: Secondary | ICD-10-CM | POA: Diagnosis not present

## 2023-04-13 DIAGNOSIS — Z5181 Encounter for therapeutic drug level monitoring: Secondary | ICD-10-CM | POA: Diagnosis not present

## 2023-04-13 DIAGNOSIS — M5416 Radiculopathy, lumbar region: Secondary | ICD-10-CM | POA: Diagnosis not present

## 2023-04-13 DIAGNOSIS — M25561 Pain in right knee: Secondary | ICD-10-CM | POA: Insufficient documentation

## 2023-04-13 MED ORDER — OXYCODONE-ACETAMINOPHEN 10-325 MG PO TABS: ORAL_TABLET | ORAL | 0 refills | Status: DC

## 2023-04-13 MED ORDER — OXYCODONE-ACETAMINOPHEN 10-325 MG PO TABS
ORAL_TABLET | ORAL | 0 refills | Status: DC
Start: 2023-04-13 — End: 2023-04-13

## 2023-04-13 NOTE — Progress Notes (Deleted)
   Established Patient Office Visit  Subjective   Patient ID: [name], female    DOB: 02-17-1971  Age: 53 y.o. MRN: [id number]  No chief complaint on file.   HPI  {History (Optional):23778}  ROS    Objective:     LMP 02/27/2011  {Vitals History (Optional):23777}  Physical Exam   No results found for any visits on 04/13/23.  {Labs (Optional):23779}  The 10-year ASCVD risk score (Arnett DK, et al., 2019) is: 2.7%    Assessment & Plan:   Problem List Items Addressed This Visit   None   No follow-ups on file.    Logann Whitebread, Royston Bake, RN

## 2023-04-13 NOTE — Progress Notes (Signed)
Subjective:    Patient ID: [name], female    DOB: [birth year], 53 y.o.   MRN: [id number]  HPI: [name] is a 53 y.o. female who returns for follow up appointment for chronic pain and medication refill. She states her pain is located in her lower back radiating into her left hip and right knee pain. She rates her pain 10. Her  current exercise regime is walking and performing stretching exercises.  [name] Morphine equivalent is 75.00 MME.   Last Oral Swab was Performed on 02/09/2023, it was consistent.     Pain Inventory Average Pain 10 Pain Right Now 10 My pain is constant, dull, tingling, and aching  In the last 24 hours, has pain interfered with the following? General activity 9 Relation with others 9 Enjoyment of life 9 What TIME of day is your pain at its worst? morning , daytime, evening, and night Sleep (in general) Poor  Pain is worse with: walking, bending, standing, and some activites Pain improves with: rest, heat/ice, and medication Relief from Meds: 2  Family History  Problem Relation Age of Onset   Hypertension Maternal Grandmother    Anesthesia problems Neg Hx    Social History   Socioeconomic History   Marital status: Divorced    Spouse name: Not on file   Number of children: Not on file   Years of education: Not on file   Highest education level: Not on file  Occupational History   Not on file  Tobacco Use   Smoking status: Never   Smokeless tobacco: Never  Vaping Use   Vaping status: Never Used  Substance and Sexual Activity   Alcohol use: No   Drug use: No   Sexual activity: Not Currently    Birth control/protection: None    Comment: IUD removed 2 wks ago.  Other Topics Concern   Not on file  Social History Narrative   Not on file   Social Drivers of Health   Financial Resource Strain: Not on file  Food Insecurity: Not on file  Transportation Needs: Not on file  Physical Activity: Not on file  Stress: Not on file   Social Connections: Not on file   Past Surgical History:  Procedure Laterality Date   ABDOMINAL HYSTERECTOMY  06/29/11   Robotic Assisted Hysterectomy   CERVICAL SPINE SURGERY  10   CESAREAN SECTION  1996   LUMBAR LAMINECTOMY  08/31/2011   Procedure: MICRODISCECTOMY LUMBAR LAMINECTOMY;  Surgeon: Jacki Cones, MD;  Location: [facility];  Service: Orthopedics;  Laterality: N/A;  lumbar five sacral one central microdisectomy   MYOMECTOMY  13   NECK SURGERY     POSTERIOR CERVICAL FUSION/FORAMINOTOMY N/A 02/02/2013   Procedure: POSTERIOR C5-6 SPINAL FUSION;  Surgeon: Venita Lick, MD;  Location: [facility];  Service: Orthopedics;  Laterality: N/A;   POSTERIOR FUSION CERVICAL SPINE  02/02/2013   C 5 C6     Dr Shon Baton   WISDOM TOOTH EXTRACTION     Past Surgical History:  Procedure Laterality Date   ABDOMINAL HYSTERECTOMY  06/29/11   Robotic Assisted Hysterectomy   CERVICAL SPINE SURGERY  10   CESAREAN SECTION  1996   LUMBAR LAMINECTOMY  08/31/2011   Procedure: MICRODISCECTOMY LUMBAR LAMINECTOMY;  Surgeon: Jacki Cones, MD;  Location: [facility];  Service: Orthopedics;  Laterality: N/A;  lumbar five sacral one central microdisectomy   MYOMECTOMY  13   NECK SURGERY     POSTERIOR CERVICAL FUSION/FORAMINOTOMY N/A 02/02/2013  Procedure: POSTERIOR C5-6 SPINAL FUSION;  Surgeon: Venita Lick, MD;  Location: [facility];  Service: Orthopedics;  Laterality: N/A;   POSTERIOR FUSION CERVICAL SPINE  02/02/2013   C 5 C6     Dr Shon Baton   WISDOM TOOTH EXTRACTION     Past Medical History:  Diagnosis Date   Anemia    hx   Arthritis    Complication of anesthesia    slow to wake up,   very difficult to get iv had picc line last time   Difficult intravenous access    hands are usually best for blood draws and IV starts   DVT (deep venous thrombosis) (HCC)    ? vs lupus   Fibroids    Headache(784.0)    Neck pain    Scoliosis    BP (!) 147/94   Pulse 94   Ht 5\' 5"  (1.651 m)   LMP 02/27/2011   SpO2 99%    BMI 43.60 kg/m   Opioid Risk Score:   Fall Risk Score:  `1  Depression screen [facility] 2/9     12/07/2022   11:40 AM 09/22/2022   11:40 AM 07/31/2022   11:48 AM 06/01/2022   11:24 AM 05/19/2022   10:44 AM 03/11/2022   11:19 AM 10/15/2021   11:27 AM  Depression screen [facility] 2/9  Decreased Interest 0 0 0 0 0 0 0  Down, Depressed, Hopeless 0 0 0 0 0 0 0  PHQ - 2 Score 0 0 0 0 0 0 0     Review of Systems  Musculoskeletal:  Positive for back pain.       Objective:   Physical Exam Vitals and nursing note reviewed.  Constitutional:      Appearance: Normal appearance.  Cardiovascular:     Rate and Rhythm: Normal rate and regular rhythm.     Pulses: Normal pulses.     Heart sounds: Normal heart sounds.  Pulmonary:     Effort: Pulmonary effort is normal.     Breath sounds: Normal breath sounds.  Musculoskeletal:     Comments: Normal Muscle Bulk and Muscle Testing Reveals:  Upper Extremities: Full ROM and Muscle Strength 5/5 Lumbar Paraspinal Tenderness: L-4-L-5 Left Greater Trochanter Tenderness Lower Extremities: Full ROM and Muscle Strength 5/5 Arises from Table slowly Narrow Based Gait     Skin:    General: Skin is warm and dry.  Neurological:     Mental Status: She is alert and oriented to person, place, and time.  Psychiatric:        Mood and Affect: Mood normal.        Behavior: Behavior normal.         Assessment & Plan:  1. 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 Status post decompressive laminectomy:Continue to Monitor  Refilled: Oxycodone 10mg /325 mg one tablet five times a day as needed #135.  Second script sent for the following month. 04/13/2023 We will continue the opioid monitoring program, this consists of regular clinic visits, examinations, urine drug screen, pill counts as well as use of [place] Controlled Substance Reporting system. A 12 month History has been reviewed on the [place] Controlled Substance  Reporting System on 04/13/2023. 2. Insomnia/ Anxiety: Continue  Xanax to 0.5 mg at HS, educated on the [employer], she verbalizes understanding. 04/13/2023 3. Lumbar Radiculopathy/Neuropathic pain: Continue current medication regimen with Gabapentin. 04/13/2023 4. Muscle Spasms. Continue current medication regimen with: Tizanidine. 04/13/2023 5. Neurogenic bowel and bladder. Continue  Bowel Regimen. Continue to monitor. 04/13/2023 6. Constipation/Opioid Therapy: Continue current medication regimen with  Linzess. 04/13/2023 7. Depression: Continue current medication regimen with Celexa. Continue to monitor. 04/13/2023 8. Left  Greater Trochanteric Bursitis:  Continue  Heat/Ice Therapy. 04/13/2023 9. Chronic Pain Syndrome: Continue current medication regimen with Voltaren. 04/13/2023 10. Chronic Right Knee Pain: . Continue HEP as Tolerated. Continue current medication regimen and continue to monitor. 04/13/2023 11. Cervicalgia/ Cervical Radiculitis: No complaints today. Continue current medication regimen and HEP as tolerated.. Continue to Monitor. 04/13/2023 .    F/U in 2 months

## 2023-05-25 DIAGNOSIS — H5213 Myopia, bilateral: Secondary | ICD-10-CM | POA: Diagnosis not present

## 2023-05-31 DIAGNOSIS — J3489 Other specified disorders of nose and nasal sinuses: Secondary | ICD-10-CM | POA: Diagnosis not present

## 2023-05-31 DIAGNOSIS — R430 Anosmia: Secondary | ICD-10-CM | POA: Diagnosis not present

## 2023-06-09 ENCOUNTER — Encounter: Payer: Medicaid Other | Attending: Physical Medicine and Rehabilitation | Admitting: Registered Nurse

## 2023-06-09 ENCOUNTER — Encounter: Payer: Self-pay | Admitting: Registered Nurse

## 2023-06-09 VITALS — BP 121/83 | HR 93 | Ht 65.0 in | Wt 273.0 lb

## 2023-06-09 DIAGNOSIS — Z5181 Encounter for therapeutic drug level monitoring: Secondary | ICD-10-CM | POA: Insufficient documentation

## 2023-06-09 DIAGNOSIS — G894 Chronic pain syndrome: Secondary | ICD-10-CM | POA: Diagnosis not present

## 2023-06-09 DIAGNOSIS — M7061 Trochanteric bursitis, right hip: Secondary | ICD-10-CM | POA: Diagnosis not present

## 2023-06-09 DIAGNOSIS — Z79891 Long term (current) use of opiate analgesic: Secondary | ICD-10-CM | POA: Insufficient documentation

## 2023-06-09 DIAGNOSIS — G834 Cauda equina syndrome: Secondary | ICD-10-CM | POA: Insufficient documentation

## 2023-06-09 DIAGNOSIS — M7062 Trochanteric bursitis, left hip: Secondary | ICD-10-CM | POA: Insufficient documentation

## 2023-06-09 DIAGNOSIS — M5416 Radiculopathy, lumbar region: Secondary | ICD-10-CM | POA: Insufficient documentation

## 2023-06-09 MED ORDER — DICLOFENAC SODIUM 75 MG PO TBEC: 75.0000 mg | DELAYED_RELEASE_TABLET | Freq: Two times a day (BID) | ORAL | 3 refills | Status: DC

## 2023-06-09 MED ORDER — TIZANIDINE HCL 4 MG PO TABS: ORAL_TABLET | ORAL | 3 refills | Status: DC

## 2023-06-09 MED ORDER — OXYCODONE-ACETAMINOPHEN 10-325 MG PO TABS: ORAL_TABLET | ORAL | 0 refills | Status: DC

## 2023-06-09 MED ORDER — LINACLOTIDE 290 MCG PO CAPS: 290.0000 ug | ORAL_CAPSULE | Freq: Every day | ORAL | 3 refills | Status: DC

## 2023-06-09 MED ORDER — GABAPENTIN 300 MG PO CAPS: ORAL_CAPSULE | ORAL | 3 refills | Status: DC

## 2023-06-09 NOTE — Progress Notes (Signed)
 Subjective:    Patient ID: [name], female    DOB: 1970-12-19, 53 y.o.   MRN: [id number]  HPI: [name] is a 53 y.o. female who returns for follow up appointment for chronic pain and medication refill. She states her pain is located in her lower back radiating into her left hip. She rates her pain 8. Her current exercise regime is walking and performing stretching exercises.  [name]   Morphine equivalent is 75.00 MME.   Oral Swab Performed today.      Pain Inventory Average Pain 8 Pain Right Now 8 My pain is sharp, dull, and aching  In the last 24 hours, has pain interfered with the following? General activity 8 Relation with others 7 Enjoyment of life 9 What TIME of day is your pain at its worst? varies Sleep (in general) Poor  Pain is worse with: walking, bending, sitting, and standing Pain improves with: therapy/exercise and medication Relief from Meds: 1  Family History  Problem Relation Age of Onset   Hypertension Maternal Grandmother    Anesthesia problems Neg Hx    Social History   Socioeconomic History   Marital status: Divorced    Spouse name: Not on file   Number of children: Not on file   Years of education: Not on file   Highest education level: Not on file  Occupational History   Not on file  Tobacco Use   Smoking status: Never   Smokeless tobacco: Never  Vaping Use   Vaping status: Never Used  Substance and Sexual Activity   Alcohol use: No   Drug use: No   Sexual activity: Not Currently    Birth control/protection: None    Comment: IUD removed 2 wks ago.  Other Topics Concern   Not on file  Social History Narrative   Not on file   Social Drivers of Health   Financial Resource Strain: Not on file  Food Insecurity: Not on file  Transportation Needs: Not on file  Physical Activity: Not on file  Stress: Not on file  Social Connections: Not on file   Past Surgical History:  Procedure Laterality Date   ABDOMINAL  HYSTERECTOMY  06/29/11   Robotic Assisted Hysterectomy   CERVICAL SPINE SURGERY  10   CESAREAN SECTION  1996   LUMBAR LAMINECTOMY  08/31/2011   Procedure: MICRODISCECTOMY LUMBAR LAMINECTOMY;  Surgeon: Jacki Cones, MD;  Location: [facility];  Service: Orthopedics;  Laterality: N/A;  lumbar five sacral one central microdisectomy   MYOMECTOMY  13   NECK SURGERY     POSTERIOR CERVICAL FUSION/FORAMINOTOMY N/A 02/02/2013   Procedure: POSTERIOR C5-6 SPINAL FUSION;  Surgeon: Venita Lick, MD;  [facility]: [facility];  Service: Orthopedics;  Laterality: N/A;   POSTERIOR FUSION CERVICAL SPINE  02/02/2013   C 5 C6     Dr Shon Baton   WISDOM TOOTH EXTRACTION     Past Surgical History:  Procedure Laterality Date   ABDOMINAL HYSTERECTOMY  06/29/11   Robotic Assisted Hysterectomy   CERVICAL SPINE SURGERY  10   CESAREAN SECTION  1996   LUMBAR LAMINECTOMY  08/31/2011   Procedure: MICRODISCECTOMY LUMBAR LAMINECTOMY;  Surgeon: Jacki Cones, MD;  [facility];  Service: Orthopedics;  Laterality: N/A;  lumbar five sacral one central microdisectomy   MYOMECTOMY  13   NECK SURGERY     POSTERIOR CERVICAL FUSION/FORAMINOTOMY N/A 02/02/2013   Procedure: POSTERIOR C5-6 SPINAL FUSION;  Surgeon: Venita Lick, MD;  [facility]: [facility];  Service:  Orthopedics;  Laterality: N/A;   POSTERIOR FUSION CERVICAL SPINE  02/02/2013   C 5 C6     Dr Shon Baton   WISDOM TOOTH EXTRACTION     Past Medical History:  Diagnosis Date   Anemia    hx   Arthritis    Complication of anesthesia    slow to wake up,   very difficult to get iv had picc line last time   Difficult intravenous access    hands are usually best for blood draws and IV starts   DVT (deep venous thrombosis) (HCC)    ? vs lupus   Fibroids    Headache(784.0)    Neck pain    Scoliosis    LMP 02/27/2011   Opioid Risk Score:   Fall Risk Score:  `1  Depression screen [facility] 2/9     04/13/2023   11:32 AM 12/07/2022   11:40 AM 09/22/2022   11:40 AM 07/31/2022   11:48 AM  06/01/2022   11:24 AM 05/19/2022   10:44 AM 03/11/2022   11:19 AM  Depression screen [facility] 2/9  Decreased Interest 0 0 0 0 0 0 0  Down, Depressed, Hopeless 0 0 0 0 0 0 0  PHQ - 2 Score 0 0 0 0 0 0 0    Review of Systems  Musculoskeletal:  Positive for back pain.       Left hip       Objective:   Physical Exam Vitals and nursing note reviewed.  Constitutional:      Appearance: Normal appearance. She is obese.  Cardiovascular:     Rate and Rhythm: Normal rate and regular rhythm.     Pulses: Normal pulses.     Heart sounds: Normal heart sounds.  Pulmonary:     Effort: Pulmonary effort is normal.     Breath sounds: Normal breath sounds.  Musculoskeletal:     Comments: Normal Muscle Bulk and Muscle Testing Reveals:  Upper Extremities: Full ROM and Muscle Strength 5/5 Lumbar Paraspinal Tenderness: L-3-L-5 Left Greater Trochanter Tenderness Lower Extremities: Full ROM and Muscle Strength 5/5 Arises from Table slowly using cane or support Antalgic  Gait     Skin:    General: Skin is warm and dry.  Neurological:     Mental Status: She is alert and oriented to person, place, and time.  Psychiatric:        Mood and Affect: Mood normal.        Behavior: Behavior normal.         Assessment & Plan:  1. 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 Status post decompressive laminectomy:Continue to Monitor  Refilled: Oxycodone 10mg /325 mg one tablet five times a day as needed #135.  Second script sent for the following month. 06/09/2023 We will continue the opioid monitoring program, this consists of regular clinic visits, examinations, urine drug screen, pill counts as well as use of [place] Controlled Substance Reporting system. A 12 month History has been reviewed on the [place] Controlled Substance Reporting System on 06/09/2023. 2. Insomnia/ Anxiety: Continue  Xanax to 0.5 mg at HS, educated on the [employer], she verbalizes  understanding. 06/09/2023 3. Lumbar Radiculopathy/Neuropathic pain: Continue current medication regimen with Gabapentin. 06/09/2023 4. Muscle Spasms. Continue current medication regimen with: Tizanidine. 06/09/2023 5. Neurogenic bowel and bladder. Continue Bowel Regimen. Continue to monitor. 06/09/2023 6. Constipation/Opioid Therapy: Continue current medication regimen with  Linzess. 06/09/2023 7. Depression: Continue current medication regimen with Celexa. Continue to  monitor. 06/09/2023 8. Left  Greater Trochanteric Bursitis:  Continue  Heat/Ice Therapy. 06/09/2023 9. Chronic Pain Syndrome: Continue current medication regimen with Voltaren. 06/09/2023 10. Chronic Right Knee Pain: .No complaints today.  Continue HEP as Tolerated. Continue current medication regimen and continue to monitor. 06/09/2023 11. Cervicalgia/ Cervical Radiculitis: No complaints today. Continue current medication regimen and HEP as tolerated.. Continue to Monitor. 06/09/2023 .    F/U in 2 months

## 2023-06-15 ENCOUNTER — Telehealth: Payer: Self-pay | Admitting: Registered Nurse

## 2023-06-15 NOTE — Telephone Encounter (Signed)
 Call place to [facility] concerning last swab done on 06/09/23. They are checking to see if the swab is still available for a re-run.

## 2023-06-15 NOTE — Telephone Encounter (Signed)
 [name],  Please have Toxicologist, run her Swab again .

## 2023-06-22 LAB — DRUG TOX MONITOR 1 W/CONF, ORAL FLD
Alprazolam: NEGATIVE ng/mL (ref <0.50)
Aminoclonazepam: NEGATIVE ng/mL (ref <0.50)
Amphetamine: 11 ng/mL — ABNORMAL HIGH (ref <10)
Amphetamines: POSITIVE ng/mL — AB (ref <10)
Barbiturates: NEGATIVE ng/mL (ref <10)
Benzodiazepines: NEGATIVE ng/mL (ref <0.50)
Buprenorphine: NEGATIVE ng/mL (ref <0.10)
Chlordiazepoxide: NEGATIVE ng/mL (ref <0.50)
Cocaine: NEGATIVE ng/mL (ref <5.0)
Diazepam: NEGATIVE ng/mL (ref <0.50)
Dihydrocodeine: NEGATIVE ng/mL (ref <2.5)
Fentanyl: NEGATIVE ng/mL (ref <0.10)
Fentanyl: NEGATIVE ng/mL (ref <0.10)
Flunitrazepam: NEGATIVE ng/mL (ref <0.50)
Flurazepam: NEGATIVE ng/mL (ref <0.50)
Hydrocodone: 193.4 ng/mL — ABNORMAL HIGH (ref <2.5)
Hydromorphone: 26.1 ng/mL — ABNORMAL HIGH (ref <2.5)
Lorazepam: NEGATIVE ng/mL (ref <0.50)
MARIJUANA: NEGATIVE ng/mL (ref <2.5)
MDMA: NEGATIVE ng/mL (ref <10)
Meprobamate: NEGATIVE ng/mL (ref <2.5)
Methadone: NEGATIVE ng/mL (ref <5.0)
Methamphetamine: NEGATIVE ng/mL (ref <10)
Midazolam: NEGATIVE ng/mL (ref <0.50)
Morphine: NEGATIVE ng/mL (ref <2.5)
Nicotine Metabolite: NEGATIVE ng/mL (ref <5.0)
Nordiazepam: NEGATIVE ng/mL (ref <0.50)
Norhydrocodone: NEGATIVE ng/mL (ref <2.5)
Noroxycodone: 116.9 ng/mL — ABNORMAL HIGH (ref <2.5)
Opiates: POSITIVE ng/mL — AB (ref <2.5)
Oxazepam: NEGATIVE ng/mL (ref <0.50)
Oxycodone: >250 ng/mL — ABNORMAL HIGH (ref <2.5)
Oxymorphone: 25 ng/mL — ABNORMAL HIGH (ref <2.5)
Phencyclidine: NEGATIVE ng/mL (ref <10)
Tapentadol: NEGATIVE ng/mL (ref <5.0)
Temazepam: NEGATIVE ng/mL (ref <0.50)
Tramadol: NEGATIVE ng/mL (ref <5.0)
Triazolam: NEGATIVE ng/mL (ref <0.50)
Zolpidem: NEGATIVE ng/mL (ref <5.0)

## 2023-06-22 LAB — NICHOLS-CHANTILLY MISCELLANEOUS ORDER: PRICE:: 0

## 2023-06-22 LAB — DRUG TOX ALC METAB W/CON, ORAL FLD
Alcohol Metabolite: POSITIVE ng/mL — AB (ref <25)
Ethyl Sulfate: 51 ng/mL — ABNORMAL HIGH (ref <25)

## 2023-06-22 NOTE — Telephone Encounter (Signed)
 Still pending. Reference # [id number]

## 2023-06-24 ENCOUNTER — Telehealth: Payer: Self-pay | Admitting: Registered Nurse

## 2023-06-24 NOTE — Telephone Encounter (Signed)
 Letter mailed and sent through Mychart with FINAL warning.

## 2023-06-24 NOTE — Telephone Encounter (Signed)
 Call placed to [facility] Pharmacist: She reports Phentermine can result in + Amphetamine . Last Phentermine fill date is 12/2022 Call placed to Ms. Dickenson, she reports she only takes the Phentermine occasionally. She denies taking any  Hydrocodone, she is only taking her medication as prescribed Oxycodone.  [facility] was called regarding her Oral Swab results :  Ms. Lalli oral swab was repeated  again  + hydrocodone , + Hydromorphone.  02/09/2023 Oral swab result was consistent.  UDS: Consistent on 10/13/2022 05/04/2022 and 10/15/2021.  Will discuss the above with Dr Riley Kill, Ms. Boehme will be given a Final Warning letter if Dr Riley Kill in agreement.

## 2023-07-23 ENCOUNTER — Telehealth: Payer: Self-pay

## 2023-07-23 NOTE — Telephone Encounter (Signed)
 Approved on April 30 by [employer] 2017 NCP[place] Request Reference Number: [id number]. OXYCOD/APAP TAB 10-325MG  is approved through 01/20/2024. For further questions, call [employer] at [phone]. Effective Date: 07/21/2023

## 2023-08-11 ENCOUNTER — Encounter: Attending: Physical Medicine and Rehabilitation | Admitting: Registered Nurse

## 2023-08-11 VITALS — BP 119/82 | HR 80 | Ht 65.0 in | Wt 275.0 lb

## 2023-08-11 DIAGNOSIS — Z79891 Long term (current) use of opiate analgesic: Secondary | ICD-10-CM | POA: Diagnosis not present

## 2023-08-11 DIAGNOSIS — M7061 Trochanteric bursitis, right hip: Secondary | ICD-10-CM | POA: Diagnosis not present

## 2023-08-11 DIAGNOSIS — G894 Chronic pain syndrome: Secondary | ICD-10-CM | POA: Insufficient documentation

## 2023-08-11 DIAGNOSIS — G834 Cauda equina syndrome: Secondary | ICD-10-CM | POA: Insufficient documentation

## 2023-08-11 DIAGNOSIS — Z5181 Encounter for therapeutic drug level monitoring: Secondary | ICD-10-CM | POA: Diagnosis not present

## 2023-08-11 DIAGNOSIS — M7062 Trochanteric bursitis, left hip: Secondary | ICD-10-CM | POA: Diagnosis not present

## 2023-08-11 DIAGNOSIS — M5416 Radiculopathy, lumbar region: Secondary | ICD-10-CM | POA: Diagnosis not present

## 2023-08-11 MED ORDER — OXYCODONE-ACETAMINOPHEN 10-325 MG PO TABS: ORAL_TABLET | ORAL | 0 refills | Status: DC

## 2023-08-11 NOTE — Progress Notes (Unsigned)
 Subjective:    Patient ID: [name], female    DOB: [birth year], 53 y.o.   MRN: [id number]  HPI: [name] is a 53 y.o. female who returns for follow up appointment for chronic pain and medication refill. states *** pain is located in  ***. rates pain ***. current exercise regime is walking and performing stretching exercises.  [name] Morphine  equivalent is *** MME.       Pain Inventory Average Pain 8 Pain Right Now 8 My pain is sharp, dull, stabbing, tingling, and aching  In the last 24 hours, has pain interfered with the following? General activity 9 Relation with others 9 Enjoyment of life 9 What TIME of day is your pain at its worst? morning , daytime, evening, and night Sleep (in general) Poor  Pain is worse with: walking, bending, sitting, and standing Pain improves with: rest, heat/ice, therapy/exercise, pacing activities, and medication Relief from Meds: 3  Family History  Problem Relation Age of Onset   Hypertension Maternal Grandmother    Anesthesia problems Neg Hx    Social History   Socioeconomic History   Marital status: Divorced    Spouse name: Not on file   Number of children: Not on file   Years of education: Not on file   Highest education level: Not on file  Occupational History   Not on file  Tobacco Use   Smoking status: Never   Smokeless tobacco: Never  Vaping Use   Vaping status: Never Used  Substance and Sexual Activity   Alcohol  use: No   Drug use: No   Sexual activity: Not Currently    Birth control/protection: None    Comment: IUD removed 2 wks ago.  Other Topics Concern   Not on file  Social History Narrative   Not on file   Social Drivers of Health   Financial Resource Strain: Not on file  Food Insecurity: Not on file  Transportation Needs: Not on file  Physical Activity: Not on file  Stress: Not on file  Social Connections: Not on file   Past Surgical History:  Procedure Laterality Date   ABDOMINAL  HYSTERECTOMY  06/29/11   Robotic Assisted Hysterectomy   CERVICAL SPINE SURGERY  10   CESAREAN SECTION  1996   LUMBAR LAMINECTOMY  08/31/2011   Procedure: MICRODISCECTOMY LUMBAR LAMINECTOMY;  Surgeon: Florencia Hunter, MD;  Location: [facility];  Service: Orthopedics;  Laterality: N/A;  lumbar five sacral one central microdisectomy   MYOMECTOMY  13   NECK SURGERY     POSTERIOR CERVICAL FUSION/FORAMINOTOMY N/A 02/02/2013   Procedure: POSTERIOR C5-6 SPINAL FUSION;  Surgeon: Mort Ards, MD;  Location: [facility];  Service: Orthopedics;  Laterality: N/A;   POSTERIOR FUSION CERVICAL SPINE  02/02/2013   C 5 C6     Dr Vaughn Georges   WISDOM TOOTH EXTRACTION     Past Surgical History:  Procedure Laterality Date   ABDOMINAL HYSTERECTOMY  06/29/11   Robotic Assisted Hysterectomy   CERVICAL SPINE SURGERY  10   CESAREAN SECTION  1996   LUMBAR LAMINECTOMY  08/31/2011   Procedure: MICRODISCECTOMY LUMBAR LAMINECTOMY;  Surgeon: Florencia Hunter, MD;  Location: [facility];  Service: Orthopedics;  Laterality: N/A;  lumbar five sacral one central microdisectomy   MYOMECTOMY  13   NECK SURGERY     POSTERIOR CERVICAL FUSION/FORAMINOTOMY N/A 02/02/2013   Procedure: POSTERIOR C5-6 SPINAL FUSION;  Surgeon: Mort Ards, MD;  Location: [facility];  Service: Orthopedics;  Laterality: N/A;  POSTERIOR FUSION CERVICAL SPINE  02/02/2013   C 5 C6     Dr Vaughn Georges   WISDOM TOOTH EXTRACTION     Past Medical History:  Diagnosis Date   Anemia    hx   Arthritis    Complication of anesthesia    slow to wake up,   very difficult to get iv had picc line last time   Difficult intravenous access    hands are usually best for blood draws and IV starts   DVT (deep venous thrombosis) (HCC)    ? vs lupus   Fibroids    Headache(784.0)    Neck pain    Scoliosis    BP 119/82   Pulse 80   Ht 5\' 5"  (1.651 m)   Wt 275 lb (124.7 kg)   LMP 02/27/2011   SpO2 98%   BMI 45.76 kg/m   Opioid Risk Score:   Fall Risk Score:  `1  Depression screen  [facility] 2/9     06/09/2023   11:46 AM 04/13/2023   11:32 AM 12/07/2022   11:40 AM 09/22/2022   11:40 AM 07/31/2022   11:48 AM 06/01/2022   11:24 AM 05/19/2022   10:44 AM  Depression screen PHQ 2/9  Decreased Interest 0 0 0 0 0 0 0  Down, Depressed, Hopeless 0 0 0 0 0 0 0  PHQ - 2 Score 0 0 0 0 0 0 0     Review of Systems  Musculoskeletal:  Positive for back pain.       Left hip pain  All other systems reviewed and are negative.     Objective:   Physical Exam        Assessment & Plan:

## 2023-08-16 ENCOUNTER — Encounter: Payer: Self-pay | Admitting: Registered Nurse

## 2023-10-11 NOTE — Progress Notes (Signed)
 Subjective:    Patient ID: [name], female    DOB: [birth year], 53 y.o.   MRN: [id number]  HPI: [name] is a 53 y.o. female who returns for follow up appointment for chronic pain and medication refill. She states her  pain is located in her lower back radiating into her left hip. She rates her pain 9. Her current exercise regime is walking and performing stretching exercises.  [name] Morphine  equivalent is 75.00 MME.   UDS ordered today.    Pain Inventory Average Pain 9 Pain Right Now 9 My pain is constant, sharp, dull, stabbing, tingling, and aching  In the last 24 hours, has pain interfered with the following? General activity 8 Relation with others 9 Enjoyment of life 9 What TIME of day is your pain at its worst? morning , daytime, evening, and night Sleep (in general) Poor  Pain is worse with: walking, bending, sitting, and standing Pain improves with: rest, heat/ice, therapy/exercise, and medication Relief from Meds: 1  Family History  Problem Relation Age of Onset   Hypertension Maternal Grandmother    Anesthesia problems Neg Hx    Social History   Socioeconomic History   Marital status: Divorced    Spouse name: Not on file   Number of children: Not on file   Years of education: Not on file   Highest education level: Not on file  Occupational History   Not on file  Tobacco Use   Smoking status: Never   Smokeless tobacco: Never  Vaping Use   Vaping status: Never Used  Substance and Sexual Activity   Alcohol  use: No   Drug use: No   Sexual activity: Not Currently    Birth control/protection: None    Comment: IUD removed 2 wks ago.  Other Topics Concern   Not on file  Social History Narrative   Not on file   Social Drivers of Health   Financial Resource Strain: Not on file  Food Insecurity: Not on file  Transportation Needs: Not on file  Physical Activity: Not on file  Stress: Not on file  Social Connections: Not on file   Past  Surgical History:  Procedure Laterality Date   ABDOMINAL HYSTERECTOMY  06/29/11   Robotic Assisted Hysterectomy   CERVICAL SPINE SURGERY  10   CESAREAN SECTION  1996   LUMBAR LAMINECTOMY  08/31/2011   Procedure: MICRODISCECTOMY LUMBAR LAMINECTOMY;  Surgeon: Tanda DELENA Heading, MD;  Location: [facility];  Service: Orthopedics;  Laterality: N/A;  lumbar five sacral one central microdisectomy   MYOMECTOMY  13   NECK SURGERY     POSTERIOR CERVICAL FUSION/FORAMINOTOMY N/A 02/02/2013   Procedure: POSTERIOR C5-6 SPINAL FUSION;  Surgeon: Donaciano Sprang, MD;  [facility]: [facility];  Service: Orthopedics;  Laterality: N/A;   POSTERIOR FUSION CERVICAL SPINE  02/02/2013   C 5 C6     Dr Sprang   WISDOM TOOTH EXTRACTION     Past Surgical History:  Procedure Laterality Date   ABDOMINAL HYSTERECTOMY  06/29/11   Robotic Assisted Hysterectomy   CERVICAL SPINE SURGERY  10   CESAREAN SECTION  1996   LUMBAR LAMINECTOMY  08/31/2011   Procedure: MICRODISCECTOMY LUMBAR LAMINECTOMY;  Surgeon: Tanda DELENA Heading, MD;  [facility];  Service: Orthopedics;  Laterality: N/A;  lumbar five sacral one central microdisectomy   MYOMECTOMY  13   NECK SURGERY     POSTERIOR CERVICAL FUSION/FORAMINOTOMY N/A 02/02/2013   Procedure: POSTERIOR C5-6 SPINAL FUSION;  Surgeon: Donaciano Sprang, MD;  [facility]: [facility];  Service: Orthopedics;  Laterality: N/A;   POSTERIOR FUSION CERVICAL SPINE  02/02/2013   C 5 C6     Dr Burnetta   WISDOM TOOTH EXTRACTION     Past Medical History:  Diagnosis Date   Anemia    hx   Arthritis    Complication of anesthesia    slow to wake up,   very difficult to get iv had picc line last time   Difficult intravenous access    hands are usually best for blood draws and IV starts   DVT (deep venous thrombosis) (HCC)    ? vs lupus   Fibroids    Headache(784.0)    Neck pain    Scoliosis    LMP 02/27/2011   Opioid Risk Score:   Fall Risk Score:  `1  Depression screen [facility] 2/9     06/09/2023   11:46 AM  04/13/2023   11:32 AM 12/07/2022   11:40 AM 09/22/2022   11:40 AM 07/31/2022   11:48 AM 06/01/2022   11:24 AM 05/19/2022   10:44 AM  Depression screen [facility] 2/9  Decreased Interest 0 0 0 0 0 0 0  Down, Depressed, Hopeless 0 0 0 0 0 0 0  PHQ - 2 Score 0 0 0 0 0 0 0    Review of Systems  Musculoskeletal:  Positive for back pain and gait problem.       Left hip pain  All other systems reviewed and are negative.      Objective:   Physical Exam Vitals and nursing note reviewed.  Constitutional:      Appearance: Normal appearance.  Cardiovascular:     Rate and Rhythm: Normal rate and regular rhythm.     Pulses: Normal pulses.     Heart sounds: Normal heart sounds.  Pulmonary:     Effort: Pulmonary effort is normal.     Breath sounds: Normal breath sounds.  Musculoskeletal:     Comments: Normal Muscle Bulk and Muscle Testing Reveals:  Upper Extremities: Full ROM and Muscle Strength 5/5   Lumbar Paraspinal Tenderness: L-4- L-5  Left Greater Trochanter Tenderness Lower Extremities: Full ROM and Muscle Strength 5/5 Arises from Table Slowly using cane for support Antalgic  Gait     Skin:    General: Skin is warm and dry.  Neurological:     Mental Status: She is alert and oriented to person, place, and time.  Psychiatric:        Mood and Affect: Mood normal.        Behavior: Behavior normal.          Assessment & Plan:  1. 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 Status post decompressive laminectomy:Continue to Monitor  Refilled: Oxycodone  10mg /325 mg one tablet five times a day as needed #135.  Second script sent for the following month. 10/12/2023 We will continue the opioid monitoring program, this consists of regular clinic visits, examinations, urine drug screen, pill counts as well as use of [place]  Controlled Substance Reporting system. A 12 month History has been reviewed on the   Controlled Substance Reporting System on  10/12/2023. 2. Insomnia/ Anxiety: Continue  Xanax  to 0.5 mg at HS, educated on the [employer], she verbalizes understanding. 10/12/2023 3. Lumbar Radiculopathy/Neuropathic pain: Continue current medication regimen with Gabapentin . 10/12/2023 4. Muscle Spasms. Continue current medication regimen with: Tizanidine . 10/12/2023 5. Neurogenic bowel and bladder. Continue Bowel Regimen. Continue to monitor. 10/12/2023 6. Constipation/Opioid Therapy:  Continue current medication regimen with  Linzess . 10/12/2023 7. Depression: Continue current medication regimen with Celexa . Continue to monitor. 10/12/2023 8. Left  Greater Trochanteric Bursitis:  Continue  Heat/Ice Therapy. 10/12/2023 9. Chronic Pain Syndrome: Continue current medication regimen with Voltaren . 10/12/2023 10. Chronic Right Knee Pain: .No complaints today.  Continue HEP as Tolerated. Continue current medication regimen and continue to monitor. 10/12/2023 11. Cervicalgia/ Cervical Radiculitis: No complaints today. Continue current medication regimen and HEP as tolerated.. Continue to Monitor. 10/12/2023 .    F/U in 2 months

## 2023-10-12 ENCOUNTER — Encounter: Attending: Physical Medicine and Rehabilitation | Admitting: Registered Nurse

## 2023-10-12 ENCOUNTER — Encounter: Payer: Self-pay | Admitting: Registered Nurse

## 2023-10-12 VITALS — BP 128/84 | HR 100 | Ht 65.0 in | Wt 270.0 lb

## 2023-10-12 DIAGNOSIS — G834 Cauda equina syndrome: Secondary | ICD-10-CM | POA: Insufficient documentation

## 2023-10-12 DIAGNOSIS — Z5181 Encounter for therapeutic drug level monitoring: Secondary | ICD-10-CM | POA: Insufficient documentation

## 2023-10-12 DIAGNOSIS — M7061 Trochanteric bursitis, right hip: Secondary | ICD-10-CM | POA: Insufficient documentation

## 2023-10-12 DIAGNOSIS — G894 Chronic pain syndrome: Secondary | ICD-10-CM | POA: Diagnosis not present

## 2023-10-12 DIAGNOSIS — M5416 Radiculopathy, lumbar region: Secondary | ICD-10-CM | POA: Diagnosis not present

## 2023-10-12 DIAGNOSIS — M7062 Trochanteric bursitis, left hip: Secondary | ICD-10-CM | POA: Diagnosis not present

## 2023-10-12 DIAGNOSIS — Z79891 Long term (current) use of opiate analgesic: Secondary | ICD-10-CM | POA: Insufficient documentation

## 2023-10-12 MED ORDER — OXYCODONE-ACETAMINOPHEN 10-325 MG PO TABS: ORAL_TABLET | ORAL | 0 refills | Status: DC

## 2023-10-12 MED ORDER — GABAPENTIN 300 MG PO CAPS: ORAL_CAPSULE | ORAL | 3 refills | Status: AC

## 2023-10-13 DIAGNOSIS — G894 Chronic pain syndrome: Secondary | ICD-10-CM | POA: Diagnosis not present

## 2023-10-13 DIAGNOSIS — Z79891 Long term (current) use of opiate analgesic: Secondary | ICD-10-CM | POA: Diagnosis not present

## 2023-10-13 DIAGNOSIS — Z5181 Encounter for therapeutic drug level monitoring: Secondary | ICD-10-CM | POA: Diagnosis not present

## 2023-10-19 DIAGNOSIS — K5904 Chronic idiopathic constipation: Secondary | ICD-10-CM | POA: Diagnosis not present

## 2023-10-19 DIAGNOSIS — Z1211 Encounter for screening for malignant neoplasm of colon: Secondary | ICD-10-CM | POA: Diagnosis not present

## 2023-10-19 DIAGNOSIS — R635 Abnormal weight gain: Secondary | ICD-10-CM | POA: Diagnosis not present

## 2023-10-19 LAB — TOXASSURE SELECT,+ANTIDEPR,UR

## 2023-10-20 NOTE — Telephone Encounter (Signed)
 error

## 2023-10-21 ENCOUNTER — Other Ambulatory Visit: Payer: Self-pay | Admitting: Registered Nurse

## 2023-11-23 ENCOUNTER — Other Ambulatory Visit: Payer: Self-pay | Admitting: Registered Nurse

## 2023-12-03 DIAGNOSIS — K5730 Diverticulosis of large intestine without perforation or abscess without bleeding: Secondary | ICD-10-CM | POA: Diagnosis not present

## 2023-12-03 DIAGNOSIS — Z1211 Encounter for screening for malignant neoplasm of colon: Secondary | ICD-10-CM | POA: Diagnosis not present

## 2023-12-14 ENCOUNTER — Encounter: Attending: Physical Medicine and Rehabilitation | Admitting: Registered Nurse

## 2023-12-14 VITALS — BP 145/88 | HR 82 | Ht 65.0 in | Wt 269.0 lb

## 2023-12-14 DIAGNOSIS — M5116 Intervertebral disc disorders with radiculopathy, lumbar region: Secondary | ICD-10-CM | POA: Insufficient documentation

## 2023-12-14 DIAGNOSIS — M62838 Other muscle spasm: Secondary | ICD-10-CM | POA: Insufficient documentation

## 2023-12-14 DIAGNOSIS — K592 Neurogenic bowel, not elsewhere classified: Secondary | ICD-10-CM | POA: Insufficient documentation

## 2023-12-14 DIAGNOSIS — Z79899 Other long term (current) drug therapy: Secondary | ICD-10-CM | POA: Diagnosis not present

## 2023-12-14 DIAGNOSIS — W101XXA Fall (on)(from) sidewalk curb, initial encounter: Secondary | ICD-10-CM | POA: Insufficient documentation

## 2023-12-14 DIAGNOSIS — T402X5D Adverse effect of other opioids, subsequent encounter: Secondary | ICD-10-CM | POA: Diagnosis not present

## 2023-12-14 DIAGNOSIS — M47816 Spondylosis without myelopathy or radiculopathy, lumbar region: Secondary | ICD-10-CM | POA: Diagnosis not present

## 2023-12-14 DIAGNOSIS — G894 Chronic pain syndrome: Secondary | ICD-10-CM | POA: Diagnosis not present

## 2023-12-14 DIAGNOSIS — G834 Cauda equina syndrome: Secondary | ICD-10-CM | POA: Insufficient documentation

## 2023-12-14 DIAGNOSIS — W101XXD Fall (on)(from) sidewalk curb, subsequent encounter: Secondary | ICD-10-CM

## 2023-12-14 DIAGNOSIS — M7062 Trochanteric bursitis, left hip: Secondary | ICD-10-CM | POA: Diagnosis not present

## 2023-12-14 DIAGNOSIS — K5903 Drug induced constipation: Secondary | ICD-10-CM | POA: Insufficient documentation

## 2023-12-14 DIAGNOSIS — Z79891 Long term (current) use of opiate analgesic: Secondary | ICD-10-CM | POA: Diagnosis not present

## 2023-12-14 DIAGNOSIS — F419 Anxiety disorder, unspecified: Secondary | ICD-10-CM | POA: Insufficient documentation

## 2023-12-14 DIAGNOSIS — M25561 Pain in right knee: Secondary | ICD-10-CM | POA: Diagnosis not present

## 2023-12-14 DIAGNOSIS — M542 Cervicalgia: Secondary | ICD-10-CM | POA: Diagnosis not present

## 2023-12-14 DIAGNOSIS — G4700 Insomnia, unspecified: Secondary | ICD-10-CM | POA: Insufficient documentation

## 2023-12-14 DIAGNOSIS — Z760 Encounter for issue of repeat prescription: Secondary | ICD-10-CM | POA: Diagnosis not present

## 2023-12-14 DIAGNOSIS — Z5181 Encounter for therapeutic drug level monitoring: Secondary | ICD-10-CM | POA: Insufficient documentation

## 2023-12-14 MED ORDER — OXYCODONE-ACETAMINOPHEN 10-325 MG PO TABS: ORAL_TABLET | ORAL | 0 refills | Status: DC

## 2023-12-14 NOTE — Progress Notes (Signed)
 Subjective:    Patient ID: [name], female    DOB: Aug 24, 1970, 53 y.o.   MRN: [id number]  HPI: [name] is a 53 y.o. female who returns for follow up appointment for chronic pain and medication refill. She states her pain is located in her lower back and  left hip. She  rates her pain 9. Her current exercise regime is walking and performing stretching exercises.  [name] reports last Friday she was walking and when she took a step to go on the curb, her left knee gave out, she landed on her left knee. Her friend helped her up, she also reports she wasn't using her cane. She didn't seek medical attention. She was educated on falls prevention and instructed to use her cane at all times, She verbalizes understanding.   [name] Morphine  equivalent is 75.00 MME.   Oral Swab performed today.     Pain Inventory Average Pain 9 Pain Right Now 9 My pain is sharp, stabbing, tingling, and aching  In the last 24 hours, has pain interfered with the following? General activity 8 Relation with others 9 Enjoyment of life 9 What TIME of day is your pain at its worst? morning , daytime, evening, and night Sleep (in general) Poor  Pain is worse with: walking, bending, sitting, and standing Pain improves with: rest, heat/ice, therapy/exercise, and medication Relief from Meds: 1  Family History  Problem Relation Age of Onset   Hypertension Maternal Grandmother    Anesthesia problems Neg Hx    Social History   Socioeconomic History   Marital status: Divorced    Spouse name: Not on file   Number of children: Not on file   Years of education: Not on file   Highest education level: Not on file  Occupational History   Not on file  Tobacco Use   Smoking status: Never   Smokeless tobacco: Never  Vaping Use   Vaping status: Never Used  Substance and Sexual Activity   Alcohol  use: No   Drug use: No   Sexual activity: Not Currently    Birth control/protection: None     Comment: IUD removed 2 wks ago.  Other Topics Concern   Not on file  Social History Narrative   Not on file   Social Drivers of Health   Financial Resource Strain: Not on file  Food Insecurity: Not on file  Transportation Needs: Not on file  Physical Activity: Not on file  Stress: Not on file  Social Connections: Not on file   Past Surgical History:  Procedure Laterality Date   ABDOMINAL HYSTERECTOMY  06/29/11   Robotic Assisted Hysterectomy   CERVICAL SPINE SURGERY  10   CESAREAN SECTION  1996   LUMBAR LAMINECTOMY  08/31/2011   Procedure: MICRODISCECTOMY LUMBAR LAMINECTOMY;  Surgeon: Tanda DELENA Heading, MD;  [facility];  Service: Orthopedics;  Laterality: N/A;  lumbar five sacral one central microdisectomy   MYOMECTOMY  13   NECK SURGERY     POSTERIOR CERVICAL FUSION/FORAMINOTOMY N/A 02/02/2013   Procedure: POSTERIOR C5-6 SPINAL FUSION;  Surgeon: Donaciano Sprang, MD;  [facility];  Service: Orthopedics;  Laterality: N/A;   POSTERIOR FUSION CERVICAL SPINE  02/02/2013   C 5 C6     Dr Sprang   WISDOM TOOTH EXTRACTION     Past Surgical History:  Procedure Laterality Date   ABDOMINAL HYSTERECTOMY  06/29/11   Robotic Assisted Hysterectomy   CERVICAL SPINE SURGERY  10   CESAREAN SECTION  1996   LUMBAR LAMINECTOMY  08/31/2011   Procedure: MICRODISCECTOMY LUMBAR LAMINECTOMY;  Surgeon: Tanda DELENA Heading, MD;  [facility];  Service: Orthopedics;  Laterality: N/A;  lumbar five sacral one central microdisectomy   MYOMECTOMY  13   NECK SURGERY     POSTERIOR CERVICAL FUSION/FORAMINOTOMY N/A 02/02/2013   Procedure: POSTERIOR C5-6 SPINAL FUSION;  Surgeon: Donaciano Sprang, MD;  [facility];  Service: Orthopedics;  Laterality: N/A;   POSTERIOR FUSION CERVICAL SPINE  02/02/2013   C 5 C6     Dr Sprang   WISDOM TOOTH EXTRACTION     Past Medical History:  Diagnosis Date   Anemia    hx   Arthritis    Complication of anesthesia    slow to wake up,   very difficult to get iv had  picc line last time   Difficult intravenous access    hands are usually best for blood draws and IV starts   DVT (deep venous thrombosis) (HCC)    ? vs lupus   Fibroids    Headache(784.0)    Neck pain    Scoliosis    BP (!) 145/88   Pulse 82   Ht 5' 5 (1.651 m)   Wt 269 lb (122 kg)   LMP 02/27/2011   SpO2 98%   BMI 44.76 kg/m   Opioid Risk Score:   Fall Risk Score:  `1  Depression screen [facility] 2/9     10/12/2023   11:19 AM 06/09/2023   11:46 AM 04/13/2023   11:32 AM 12/07/2022   11:40 AM 09/22/2022   11:40 AM 07/31/2022   11:48 AM 06/01/2022   11:24 AM  Depression screen [facility] 2/9  Decreased Interest 0 0 0 0 0 0 0  Down, Depressed, Hopeless 0 0 0 0 0 0 0  PHQ - 2 Score 0 0 0 0 0 0 0     Review of Systems  Musculoskeletal:  Positive for back pain.       Left hip pain  All other systems reviewed and are negative.      Objective:   Physical Exam Vitals and nursing note reviewed.  Constitutional:      Appearance: Normal appearance. She is obese.  Cardiovascular:     Rate and Rhythm: Normal rate and regular rhythm.     Pulses: Normal pulses.     Heart sounds: Normal heart sounds.  Pulmonary:     Effort: Pulmonary effort is normal.     Breath sounds: Normal breath sounds.  Musculoskeletal:     Comments: Normal Muscle Bulk and Muscle Testing Reveals:  Upper Extremities:Full  ROM and Muscle Strength 5/5  Lumbar Paraspinal Tenderness: L-4-L-5 Left Greater Trochanter Tenderness Lower Extremities: Full ROM and Muscle Strength 5/5 Arises from Table slowly using cane for support Antalgic  Gait     Skin:    General: Skin is warm and dry.  Neurological:     Mental Status: She is alert and oriented to person, place, and time.  Psychiatric:        Mood and Affect: Mood normal.        Behavior: Behavior normal.          Assessment & Plan:  1. 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 Status post decompressive laminectomy:Continue to  Monitor  Refilled: Oxycodone  10mg /325 mg one tablet five times a day as needed #135.  Second script sent for the following month. 12/14/2023 We will continue the opioid monitoring program,  this consists of regular clinic visits, examinations, urine drug screen, pill counts as well as use of [place]  Controlled Substance Reporting system. A 12 month History has been reviewed on the [place]  Controlled Substance Reporting System on 12/14/2023. 2. Insomnia/ Anxiety: Continue  Xanax  to 0.5 mg at HS, educated on the [employer], she verbalizes understanding. 12/14/2023 3. Lumbar Radiculopathy/Neuropathic pain: Continue current medication regimen with Gabapentin . 12/14/2023 4. Muscle Spasms. Continue current medication regimen with: Tizanidine . 12/14/2023 5. Neurogenic bowel and bladder. Continue Bowel Regimen. Continue to monitor. 12/14/2023 6. Constipation/Opioid Therapy: Continue current medication regimen with  Linzess . 12/14/2023 7. Depression: Continue current medication regimen with Celexa . Continue to monitor. 12/14/2023 8. Left  Greater Trochanteric Bursitis:  Continue  Heat/Ice Therapy. 12/14/2023 9. Chronic Pain Syndrome: Continue current medication regimen with Voltaren . 12/14/2023 10. Chronic Right Knee Pain: .No complaints today.  Continue HEP as Tolerated. Continue current medication regimen and continue to monitor. 12/14/2023 11. Cervicalgia/ Cervical Radiculitis: No complaints today. Continue current medication regimen and HEP as tolerated.. Continue to Monitor. 12/14/2023 .12. Fall from sidewalk: Educated on [employer], she was instructed to use cane at all times. She verbalizes understanding.    F/U in 2 months

## 2023-12-14 NOTE — Patient Instructions (Signed)
  [employer]   [email or web address]

## 2023-12-22 ENCOUNTER — Other Ambulatory Visit: Payer: Self-pay | Admitting: Registered Nurse

## 2023-12-23 LAB — DRUG TOX MONITOR 1 W/CONF, ORAL FLD
AMINOCLONAZEPAM: NEGATIVE ng/mL (ref <0.50)
Alprazolam: NEGATIVE ng/mL (ref <0.50)
Amphetamines: NEGATIVE ng/mL (ref <10)
Barbiturates: NEGATIVE ng/mL (ref <10)
Benzodiazepines: NEGATIVE ng/mL (ref <0.50)
Buprenorphine: NEGATIVE ng/mL (ref <0.10)
Chlordiazepoxide: NEGATIVE ng/mL (ref <0.50)
Cocaine: NEGATIVE ng/mL (ref <5.0)
Diazepam: NEGATIVE ng/mL (ref <0.50)
Dihydrocodeine: NEGATIVE ng/mL (ref <2.5)
Fentanyl: NEGATIVE ng/mL (ref <0.10)
Flunitrazepam: NEGATIVE ng/mL (ref <0.50)
Flurazepam: NEGATIVE ng/mL (ref <0.50)
Hydrocodone: >250 ng/mL — ABNORMAL HIGH (ref <2.5)
Hydromorphone: NEGATIVE ng/mL (ref <2.5)
Lorazepam: NEGATIVE ng/mL (ref <0.50)
MARIJUANA: NEGATIVE ng/mL (ref <2.5)
MDMA: NEGATIVE ng/mL (ref <10)
Meprobamate: NEGATIVE ng/mL (ref <2.5)
Methadone: NEGATIVE ng/mL (ref <5.0)
Midazolam: NEGATIVE ng/mL (ref <0.50)
Morphine: 9.5 ng/mL — ABNORMAL HIGH (ref <2.5)
Nicotine Metabolite: NEGATIVE ng/mL (ref <5.0)
Nordiazepam: NEGATIVE ng/mL (ref <0.50)
Norhydrocodone: NEGATIVE ng/mL (ref <2.5)
Noroxycodone: >250 ng/mL — ABNORMAL HIGH (ref <2.5)
Opiates: POSITIVE ng/mL — AB (ref <2.5)
Oxazepam: NEGATIVE ng/mL (ref <0.50)
Oxycodone: >250 ng/mL — ABNORMAL HIGH (ref <2.5)
Oxymorphone: 42.8 ng/mL — ABNORMAL HIGH (ref <2.5)
Phencyclidine: NEGATIVE ng/mL (ref <10)
THC: NEGATIVE ng/mL (ref <2.5)
Tapentadol: NEGATIVE ng/mL (ref <5.0)
Temazepam: NEGATIVE ng/mL (ref <0.50)
Tramadol: NEGATIVE ng/mL (ref <5.0)
Triazolam: NEGATIVE ng/mL (ref <0.50)
Zolpidem: NEGATIVE ng/mL (ref <5.0)

## 2023-12-23 LAB — DRUG TOX ALC METAB W/CON, ORAL FLD: Alcohol Metabolite: NEGATIVE ng/mL (ref <25)

## 2023-12-28 ENCOUNTER — Telehealth: Payer: Self-pay | Admitting: Registered Nurse

## 2023-12-28 NOTE — Telephone Encounter (Signed)
 Called [name] to discussed Oral Swab results and UDS results. The above was discussed with Dr Babs, she will begin a slow weaning and discharged from our office.  She denies taking any medication other than prescribed. She was instructed to call [facility] [employer] to file a complaint. She verbalizes understanding.

## 2023-12-28 NOTE — Telephone Encounter (Signed)
 Doctor Babs,  The past few Drug screens on [name], has been inconsistent, she denies taking any other medications then prescribe.  Last Oral Swab : 12/14/2023  + Oxycodone , Morphine  and Hydrocodone  10/12/2021: UDS + Hydrocodone  , she denied taking Hydrocodone   06/09/2023 + Amphetamine Spoke with Pharmacist: Pharmacist stated Phentermine can give a + Amphetamine.   I will start weaning her, and she will be discharged from our office, are you in agreement.  I will call her, after receiving your response.

## 2023-12-29 ENCOUNTER — Encounter (HOSPITAL_BASED_OUTPATIENT_CLINIC_OR_DEPARTMENT_OTHER): Payer: Self-pay | Admitting: Internal Medicine

## 2023-12-29 DIAGNOSIS — G4733 Obstructive sleep apnea (adult) (pediatric): Secondary | ICD-10-CM

## 2024-01-10 LAB — LAB REPORT - SCANNED: Creatinine, POC: 310.7 mg/dL

## 2024-01-18 ENCOUNTER — Encounter: Payer: Self-pay | Admitting: Registered Nurse

## 2024-01-18 ENCOUNTER — Encounter: Attending: Registered Nurse | Admitting: Registered Nurse

## 2024-01-18 VITALS — BP 109/76 | HR 86 | Ht 65.0 in | Wt 271.0 lb

## 2024-01-18 DIAGNOSIS — Z79891 Long term (current) use of opiate analgesic: Secondary | ICD-10-CM | POA: Diagnosis not present

## 2024-01-18 DIAGNOSIS — G894 Chronic pain syndrome: Secondary | ICD-10-CM | POA: Insufficient documentation

## 2024-01-18 DIAGNOSIS — Z5181 Encounter for therapeutic drug level monitoring: Secondary | ICD-10-CM | POA: Diagnosis not present

## 2024-01-18 DIAGNOSIS — M5416 Radiculopathy, lumbar region: Secondary | ICD-10-CM | POA: Diagnosis not present

## 2024-01-18 NOTE — Progress Notes (Signed)
 Subjective:    Patient ID: [name], female    DOB: 06-02-1970, 53 y.o.   MRN: [id number]  HPI: [name] is a 53 y.o. female who returns for follow up appointment for chronic pain and medication refill. She states her pain is located in her lower back radiating into her left lower extremity. She rates her pain 10.Her  current exercise regime is walking and performing stretching exercises.  [name] , had repeated her Urine drug screen at [facility], results was reviewed. She will send a My-Chart message to Dr Babs regarding remaining a patient in our office. She understands this will have to be Dr Babs decision, she verbalizes understanding.   [name] Morphine  equivalent is 75.00 MME.   Last Oral Swab was Performed on 12/14/2023, see notes for details.      Pain Inventory Average Pain 10 Pain Right Now 10 My pain is constant, sharp, dull, stabbing, tingling, and aching  In the last 24 hours, has pain interfered with the following? General activity 9 Relation with others 9 Enjoyment of life 9 What TIME of day is your pain at its worst? morning , daytime, evening, and night Sleep (in general) Poor  Pain is worse with: walking, bending, sitting, and standing Pain improves with: rest, heat/ice, therapy/exercise, and medication Relief from Meds: 1  Family History  Problem Relation Age of Onset   Hypertension Maternal Grandmother    Anesthesia problems Neg Hx    Social History   Socioeconomic History   Marital status: Divorced    Spouse name: Not on file   Number of children: Not on file   Years of education: Not on file   Highest education level: Not on file  Occupational History   Not on file  Tobacco Use   Smoking status: Never   Smokeless tobacco: Never  Vaping Use   Vaping status: Never Used  Substance and Sexual Activity   Alcohol  use: No   Drug use: No   Sexual activity: Not Currently    Birth control/protection: None    Comment:  IUD removed 2 wks ago.  Other Topics Concern   Not on file  Social History Narrative   Not on file   Social Drivers of Health   Financial Resource Strain: Not on file  Food Insecurity: Not on file  Transportation Needs: Not on file  Physical Activity: Not on file  Stress: Not on file  Social Connections: Not on file   Past Surgical History:  Procedure Laterality Date   ABDOMINAL HYSTERECTOMY  06/29/11   Robotic Assisted Hysterectomy   CERVICAL SPINE SURGERY  10   CESAREAN SECTION  1996   LUMBAR LAMINECTOMY  08/31/2011   Procedure: MICRODISCECTOMY LUMBAR LAMINECTOMY;  Surgeon: Tanda DELENA Heading, MD;  [facility];  Service: Orthopedics;  Laterality: N/A;  lumbar five sacral one central microdisectomy   MYOMECTOMY  13   NECK SURGERY     POSTERIOR CERVICAL FUSION/FORAMINOTOMY N/A 02/02/2013   Procedure: POSTERIOR C5-6 SPINAL FUSION;  Surgeon: Donaciano Sprang, MD;  [facility];  Service: Orthopedics;  Laterality: N/A;   POSTERIOR FUSION CERVICAL SPINE  02/02/2013   C 5 C6     Dr Sprang   WISDOM TOOTH EXTRACTION     Past Surgical History:  Procedure Laterality Date   ABDOMINAL HYSTERECTOMY  06/29/11   Robotic Assisted Hysterectomy   CERVICAL SPINE SURGERY  10   CESAREAN SECTION  1996   LUMBAR LAMINECTOMY  08/31/2011  Procedure: MICRODISCECTOMY LUMBAR LAMINECTOMY;  Surgeon: Tanda DELENA Heading, MD;  [facility];  Service: Orthopedics;  Laterality: N/A;  lumbar five sacral one central microdisectomy   MYOMECTOMY  13   NECK SURGERY     POSTERIOR CERVICAL FUSION/FORAMINOTOMY N/A 02/02/2013   Procedure: POSTERIOR C5-6 SPINAL FUSION;  Surgeon: Donaciano Sprang, MD;  [facility];  Service: Orthopedics;  Laterality: N/A;   POSTERIOR FUSION CERVICAL SPINE  02/02/2013   C 5 C6     Dr Sprang   WISDOM TOOTH EXTRACTION     Past Medical History:  Diagnosis Date   Anemia    hx   Arthritis    Complication of anesthesia    slow to wake up,   very difficult to get iv had picc line  last time   Difficult intravenous access    hands are usually best for blood draws and IV starts   DVT (deep venous thrombosis) (HCC)    ? vs lupus   Fibroids    Headache(784.0)    Neck pain    Scoliosis    LMP 02/27/2011   Opioid Risk Score:   Fall Risk Score:  `1  Depression screen [facility] 2/9     10/12/2023   11:19 AM 06/09/2023   11:46 AM 04/13/2023   11:32 AM 12/07/2022   11:40 AM 09/22/2022   11:40 AM 07/31/2022   11:48 AM 06/01/2022   11:24 AM  Depression screen [facility] 2/9  Decreased Interest 0 0 0 0 0 0 0  Down, Depressed, Hopeless 0 0 0 0 0 0 0  PHQ - 2 Score 0 0 0 0 0 0 0    Review of Systems  Musculoskeletal:  Positive for back pain and gait problem.       Pain the left hip  All other systems reviewed and are negative.      Objective:   Physical Exam Vitals and nursing note reviewed.  Constitutional:      Appearance: Normal appearance. She is obese.  Cardiovascular:     Rate and Rhythm: Normal rate and regular rhythm.     Pulses: Normal pulses.     Heart sounds: Normal heart sounds.  Pulmonary:     Effort: Pulmonary effort is normal.     Breath sounds: Normal breath sounds.  Musculoskeletal:     Comments: Normal Muscle Bulk and Muscle Testing Reveals:  Upper Extremities: Full ROM and Muscle Strength 5/5 Lumbar Paraspinal Tenderness: L: L-4-L-5 Left Greater Trochanter Tenderness  Lower Extremities : Full ROM and Muscle Strength 5/5 Arises from table slowly using cane for support Antalgic Gait     Skin:    General: Skin is warm and dry.  Neurological:     Mental Status: She is alert and oriented to person, place, and time.  Psychiatric:        Mood and Affect: Mood normal.        Behavior: Behavior normal.          Assessment & Plan:  1. 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 Status post decompressive laminectomy:Continue to Monitor  Refilled: Continue 10mg /325 mg one tablet five times a day as needed #135. Awaiting  Dr Babs response to [name] question.   01/18/2024 We will continue the opioid monitoring program, this consists of regular clinic visits, examinations, urine drug screen, pill counts as well as use of [place]  Controlled Substance Reporting system. A 12 month History has been reviewed on the [place]  Controlled  Substance Reporting System on 01/18/2024. 2. Insomnia/ Anxiety: No complaints today.  01/18/2024 3. Lumbar Radiculopathy/Neuropathic pain: Continue current medication regimen with Gabapentin . 01/18/2024 4. Muscle Spasms. Continue current medication regimen with: Tizanidine . 01/18/2024 5. Neurogenic bowel and bladder. Continue Bowel Regimen. Continue to monitor. 01/18/2024 6. Constipation/Opioid Therapy: Continue current medication regimen with  Linzess . 01/18/2024 7. Depression: Continue current medication regimen with Celexa . Continue to monitor. 01/18/2024 8. Left  Greater Trochanteric Bursitis:  Continue  Heat/Ice Therapy. 01/18/2024 9. Chronic Pain Syndrome: Continue current medication regimen with Voltaren . 01/18/2024 10. Chronic Right Knee Pain: .No complaints today.  Continue HEP as Tolerated. Continue current medication regimen and continue to monitor. 01/18/2024 11. Cervicalgia/ Cervical Radiculitis: No complaints today. Continue current medication regimen and HEP as tolerated.. Continue to Monitor. 01/18/2024 .12. Fall from sidewalk: No complaints today. Educated on [employer], she was instructed to use cane at all times. She verbalizes understanding. 01/18/2024   F/U in 2 months

## 2024-01-20 ENCOUNTER — Telehealth: Payer: Self-pay | Admitting: Registered Nurse

## 2024-01-20 DIAGNOSIS — M47816 Spondylosis without myelopathy or radiculopathy, lumbar region: Secondary | ICD-10-CM

## 2024-01-20 DIAGNOSIS — G894 Chronic pain syndrome: Secondary | ICD-10-CM

## 2024-01-20 MED ORDER — OXYCODONE-ACETAMINOPHEN 7.5-325 MG PO TABS
1.0000 | ORAL_TABLET | Freq: Four times a day (QID) | ORAL | 0 refills | Status: DC | PRN
Start: 2024-01-20 — End: 2024-01-20

## 2024-01-20 MED ORDER — OXYCODONE-ACETAMINOPHEN 5-325 MG PO TABS
ORAL_TABLET | ORAL | 0 refills | Status: AC
Start: 2024-01-20 — End: ?

## 2024-01-20 NOTE — Telephone Encounter (Signed)
 Wean Prescription: sent to pharmacy for November and December. PDMP was Reviewed.  [name] is aware via My-Chart

## 2024-01-22 ENCOUNTER — Other Ambulatory Visit: Payer: Self-pay | Admitting: Registered Nurse

## 2024-01-24 ENCOUNTER — Telehealth: Payer: Self-pay | Admitting: Registered Nurse

## 2024-01-24 NOTE — Telephone Encounter (Signed)
 [facility] was called : Regarding Oxycodone  wean prescription. PA is needed and Tye Kitty is aware, Ms. Yglesias will be notified via My-Chart.

## 2024-01-25 ENCOUNTER — Telehealth: Payer: Self-pay | Admitting: Registered Nurse

## 2024-01-25 DIAGNOSIS — M47816 Spondylosis without myelopathy or radiculopathy, lumbar region: Secondary | ICD-10-CM

## 2024-01-25 DIAGNOSIS — G8929 Other chronic pain: Secondary | ICD-10-CM

## 2024-01-25 DIAGNOSIS — G894 Chronic pain syndrome: Secondary | ICD-10-CM

## 2024-01-25 MED ORDER — OXYCODONE-ACETAMINOPHEN 7.5-325 MG PO TABS: 1.0000 | ORAL_TABLET | Freq: Four times a day (QID) | ORAL | 0 refills | Status: AC | PRN

## 2024-01-25 MED ORDER — OXYCODONE-ACETAMINOPHEN 7.5-325 MG PO TABS: 1.0000 | ORAL_TABLET | Freq: Four times a day (QID) | ORAL | 0 refills | Status: DC | PRN

## 2024-01-25 NOTE — Telephone Encounter (Signed)
 Pharmacy was called, office received a request for PA for every 4 hours. This provider prescription was for 4 times a day as needed for pain.  Pharmacy disregarded previous prescription. Oxycodone  7.5 mg 4 times a day as needed for a week supply 20 tablets. Oxycodone  7.5 mg 4 times a day 100 tablets for the remainder of previous prescription. Continue with wean prescription. Pharmacy technician verbalizes understanding.

## 2024-01-26 ENCOUNTER — Telehealth: Payer: Self-pay

## 2024-01-26 NOTE — Telephone Encounter (Signed)
 PA FOR Oxy 7.5-325 MG created in Cover my meds.

## 2024-02-14 ENCOUNTER — Ambulatory Visit: Admitting: Registered Nurse

## 2024-02-22 ENCOUNTER — Other Ambulatory Visit: Payer: Self-pay | Admitting: Registered Nurse

## 2024-02-29 ENCOUNTER — Telehealth: Payer: Self-pay | Admitting: Registered Nurse

## 2024-02-29 NOTE — Telephone Encounter (Signed)
 Pt called and mention that she needs a note  stating she's not part of the practice...[name]

## 2024-03-01 ENCOUNTER — Ambulatory Visit (HOSPITAL_BASED_OUTPATIENT_CLINIC_OR_DEPARTMENT_OTHER): Attending: Internal Medicine | Admitting: Internal Medicine

## 2024-03-01 DIAGNOSIS — G4733 Obstructive sleep apnea (adult) (pediatric): Secondary | ICD-10-CM | POA: Insufficient documentation

## 2024-03-02 NOTE — Telephone Encounter (Signed)
 Discharge letter sent via MyChart and USPS certified mail.

## 2024-03-04 NOTE — Procedures (Signed)
°  Indications for Polysomnography The patient is a 53 year old Female who is 5' 5 and weighs 266.0 lbs. Her BMI equals 44.3.  A full night titration treatment study was performed.  No medication was taken.No Data. Polysomnogram Data A full night polysomnogram recorded the standard physiologic parameters including EEG, EOG, EMG, EKG, nasal and oral airflow.  Respiratory parameters of chest and abdominal movements were recorded with Respiratory Inductance Plethysmography belts.   Oxygen saturation was recorded by pulse oximetry.  Sleep Architecture The total recording time of the polysomnogram was 375.3 minutes.  The total sleep time was 317.5 minutes.  The patient spent 1.7% of total sleep time in Stage N1, 34.3% in Stage N2, 30.1% in Stages N3, and 33.9% in REM.  Sleep latency was 31.4 minutes.   REM latency was 51.5 minutes.  Sleep Efficiency was 84.6%.  Wake after Sleep Onset time was 26.0 minutes.  Titration Summary The patient was titrated at pressures ranging from 6* cm/H20 with supplemental oxygen at - up to 13* cm/H20 with supplemental oxygen at -.  The last pressure used in the study was 13* cm/H20 with supplemental oxygen at -.  Respiratory Events The polysomnogram revealed a presence of 1 obstructive, - central, and - mixed apneas resulting in an Apnea index of 0.2 events per hour.  There were 29 hypopneas (GreaterEqual to3% desaturation and/or arousal) resulting in an Apnea\Hypopnea Index (AHI  GreaterEqual to3% desaturation and/or arousal) of 5.7 events per hour.  There were 11 hypopneas (GreaterEqual to4% desaturation) resulting in an Apnea\Hypopnea Index (AHI GreaterEqual to4% desaturation) of 2.3 events per hour.  There were - Respiratory  Effort Related Arousals resulting in a RERA index of - events per hour. The Respiratory Disturbance Index is 5.7 events per hour.  The snore index was - events per hour.  Mean oxygen saturation was 94.3%.  The lowest oxygen saturation during  sleep was 90.0%.  Time spent LessEqual to88% oxygen saturation was  minutes ().  Limb Activity There were 43 limb movements recorded.  Of this total, 26 were classified as PLMs.  Of the PLMs, - were associated with arousals.  The Limb Movement index was 8.1 per hour while the PLM index was 4.9 per hour.  Cardiac Summary The average pulse rate was 81.5 bpm.  The minimum pulse rate was 64.0 bpm while the maximum pulse rate was 112.0 bpm.  Cardiac rhythm was normal/abnormal.  Comments:  Diagnosis:  Recommendations:   This study was personally reviewed and electronically signed by: Neysa Rama, MD Accredited Board Certified in Sleep Medicine Date/Time:

## 2024-03-04 NOTE — Procedures (Signed)
 [name] [facility] [address] [place], [place] [address] Tel: [phone]   Fax: [phone]  Titration Interpretation  Patient Name:  [name], [name] Date:  03/01/2024 Referring Physician:  SUZEN LAMER [phone]) %%startinterp%% Indications for Polysomnography The patient is a 53 year old Female who is 5' 5 and weighs 266.0 lbs. Her BMI equals 44.3.  A full night titration treatment study was performed Baseline HST 09/19/23  AHI 34.2/hr, saturation nadir 79%.  No medication was taken.  No Data.   Polysomnogram Data A full night polysomnogram recorded the standard physiologic parameters including EEG, EOG, EMG, EKG, nasal and oral airflow.  Respiratory parameters of chest and abdominal movements were recorded with Respiratory Inductance Plethysmography belts.  Oxygen saturation was recorded by pulse oximetry.   Sleep Architecture The total recording time of the polysomnogram was 375.3 minutes.  The total sleep time was 317.5 minutes.  The patient spent 1.7% of total sleep time in Stage N1, 34.3% in Stage N2, 30.1% in Stages N3, and 33.9% in REM.  Sleep latency was 31.4 minutes.  REM latency was 51.5 minutes.  Sleep Efficiency was 84.6%.  Wake after Sleep Onset time was 26.0 minutes.  Titration Summary The patient was titrated at pressures ranging from 6* cm/H20 with supplemental oxygen at - up to 13* cm/H20 with supplemental oxygen at -.  The last pressure used in the study was 13* cm/H20 with supplemental oxygen at -.  Respiratory Events The polysomnogram revealed a presence of 1 obstructive, - central, and - mixed apneas resulting in an Apnea index of 0.2 events per hour.  There were 29 hypopneas (>=3% desaturation and/or arousal) resulting in an Apnea\Hypopnea Index (AHI >=3% desaturation and/or arousal) of 5.7 events per hour.  There were 11 hypopneas (>=4% desaturation) resulting in an Apnea\Hypopnea Index (AHI >=4% desaturation) of  2.3 events per hour.  There were - Respiratory Effort Related Arousals resulting in a RERA index of - events per hour. The Respiratory Disturbance Index is 5.7 events per hour.  The snore index was - events per hour.  Mean oxygen saturation was 94.3%.  The lowest oxygen saturation during sleep was 90.0%.  Time spent <=88% oxygen saturation was - minutes (-).  Limb Activity There were 43 limb movements recorded.  Of this total, 26 were classified as PLMs.  Of the PLMs, - were associated with arousals.  The Limb Movement index was 8.1 per hour while the PLM index was 4.9 per hour.  Cardiac Summary The average pulse rate was 81.5 bpm.  The minimum pulse rate was 64.0 bpm while the maximum pulse rate was 112.0 bpm.  Cardiac rhythm was normal/abnormal.  Comments: CPAP titration to 13 cwp with residual AHI(3%) 1.1/hr, desaturation to 93%, mean 95%.  Diagnosis: Obstructive sleep apnea  Recommendations: Suggest autopap 5-15 or fixed CPAP 13. Patient wore a medium ResMed AirFit F-20 full face mask with heated humidity.   This study was personally reviewed and electronically signed by: Neysa Rama, MD Accredited Board Certified in Sleep Medicine Date/Time: 03/04/24  1:09    %%endinterp%%  Titration Report  Patient Name: [name] Date: 03/01/2024  Date of Birth: Sep 11, 1970 Study Type: CPAP Titration  Age: 48 year MRN #: [id number]  Sex: Female Interpreting Physician: NEYSA RAMA, [id number]  Height: 5' 5 Referring Physician: SUZEN SHELTON [id number])  Weight: 266.0 lbs Recording Tech: Hargis Abu RPSGT RST  BMI: 44.3 Scoring Tech: Hargis Abu RPSGT RST  ESS: 2 Neck Size: 15  Mask Type Airfit F20 FFM Final Pressure: 13CMH2O  Mask Size: Medium Supplemental O2: -   Study Overview  Lights Off: 10:41:12 PM  Count Index  Lights On: 04:56:29 AM Awakenings: 16 3.0  Time in Bed: 375.3 min. Arousals: 35 6.6  Total Sleep Time: 317.5 min. AHI (>=3% Desat and/or Ar.): 30 5.7   Sleep  Efficiency: 84.6% AHI (>=4% Desat): 12 2.3   Sleep Latency: 31.4 min. Limb Movements: 43 8.1  Wake After Sleep Onset: 26.0 min. Snore: - -  REM Latency from Sleep Onset: 51.5 min. Desaturations: 47 8.9     Minimum SpO2 TST: 90.0%    Sleep Architecture  % of Time in Bed Stages Time (mins) % Sleep Time  Wake 58.0   Stage N1 5.5 1.7%  Stage N2 109.0 34.3%  Stage N3 95.5 30.1%  REM 107.5 33.9%   Arousal Summary   NREM REM Sleep Index  Respiratory Arousals 5 1 6  1.1  PLM Arousals - - - -  Isolated Limb Movement Arousals 1 - 1 0.2  Snore Arousals - - - -  Spontaneous Arousals 16 12 28  5.3  Total 22 13 35 6.6   Limb Movement Summary   Count Index  Isolated Limb Movements 17 3.2  Periodic Limb Movements (PLMs) 26 4.9  Total Limb Movements 43 8.1    Respiratory Summary   By Sleep Stage By Body Position Total   NREM REM Supine Non-Supine   Time (min) 210.0 107.5 259.5 58.0 317.5         Obstructive Apnea 1 - 1 - 1  Mixed Apnea - - - - -  Central Apnea - - - - -  Total Apneas 1 - 1 - 1  Total Apnea Index 0.3 - 0.2 - 0.2         Hypopneas (>=3% Desat and/or Ar.) 13 16 28 1 29   AHI (>=3% Desat and/or Ar.) 4.0 8.9 6.7 1.0 5.7         Hypopneas (>=4% Desat) 5 6 11  - 11  AHI (>=4% Desat) 1.7 3.3 2.8 - 2.3          RERAs - - - - -  RERA Index - - - - -         RDI 4.0 8.9 6.7 1.0 5.7    Respiratory Event Type Index  Central Apneas -  Obstructive Apneas 0.2  Mixed Apneas -  Central Hypopneas -  Obstructive Hypopneas -  Central Apnea + Hypopnea (CAHI) -  Obstructive Apnea + Hypopnea (OAHI) 8.9   Respiratory Event Durations   Apnea Hypopnea   NREM REM NREM REM  Average (seconds) 14.6 - 27.8 23.5  Maximum (seconds) 14.6 - 50.4 37.1    Oxygen Saturation Summary   Wake NREM REM TST TIB  Average SpO2 95.3% 94.0% 94.4% 94.2% 94.3%  Minimum SpO2 91.0% 91.0% 90.0% 90.0% 90.0%  Maximum SpO2 99.0% 98.0% 97.0% 98.0% 99.0%   Oxygen Saturation Distribution  Range (%)  Time in range (min) Time in range (%)  90.0 - 100.0 371.1 100.0%  80.0 - 90.0 0.1 0.0%  70.0 - 80.0 - -  60.0 - 70.0 - -  50.0 - 60.0 - -  0.0 - 50.0 - -  Time Spent <=88% SpO2  Range (%) Time in range (min) Time in range (%)  0.0 - 88.0 - -      Count Index  Desaturations 47 8.9    Cardiac Summary   Wake NREM REM Sleep  Total  Average Pulse Rate (BPM) 85.1 80.5 81.5 80.8 81.5  Minimum Pulse Rate (BPM) 69.0 68.0 64.0 64.0 64.0  Maximum Pulse Rate (BPM) 112.0 100.0 104.0 104.0 112.0   Pulse Rate Distribution:  Range (bpm) Time in range (min) Time in range (%)  0.0 - 40.0 - -  40.0 - 60.0 - -  60.0 - 80.0 169.3 45.1%  80.0 - 100.0 204.6 54.5%  100.0 - 120.0 1.3 0.3%  120.0 - 140.0 - -  140.0 - 200.0 - -   Titration Summary  PAP Device PAP Level O2 Level Time (min) Wake (min) NREM (min) REM (min) Supine TST (min) Sleep Eff% OA# CA# MA# Hyp# (>=3%) AHI (>=3%) Hyp# (>=4%) AHI (>=%4) RERA RDI SpO2 <=88% (min) Min SpO2 Mean SpO2 Ar. Index  CPAP 6 - 93.5 44.5 46.5 2.5  49.0 52.4% - - - 7 8.6 2  2.4 -  8.6  0.0 91.0 93.7 8.6  CPAP 7 - 25.0 2.5 5.0 17.5  22.5 90.0% - - - 7 18.7 3  8.0 -  18.7  0.0 91.0 94.6 2.7  CPAP 9 - 91.5 2.0 66.5 23.0  89.5 97.8% - - - 10 6.7 5  3.4 -  6.7  0.0 91.0 94.0 5.4  CPAP 10 - 29.5 0.5 14.0 15.0  29.0 98.3% 1 - - 2 6.2 -  2.1 -  6.2  0.0 91.0 94.0 12.4  CPAP 11 - 55.5 0.5 40.0 15.0  48.5 99.1% - - - - - -  - -  -  0.0 92.0 93.6 3.3  CPAP 12 - 19.5 0.0 0.0 19.5  16.0 100.0% - - - 2 6.2 1  3.1 -  6.2  0.0 90.0 94.6 15.4  CPAP 13 - 61.0 8.0 38.0 15.0  5.0 86.9% - - - 1 1.1 -  - -  1.1  0.0 93.0 95.0 5.7   Hypnograms                           Technologist Comments  Patient was at Sleep Lab for OSA.  A CPAP Titration Study was ordered. Patient was fitted with a medium Airfit F20 FFM.  CPAP pressure started at 6 CMH2O and titrated to CPAP pressure of 13CMH2O with heated humidity and heated tubing.  Patient tolerated CPAP  pressure well.  Respiratory events were eliminated. Snoring was eliminated. Periodic Limb Movement was occasional with/without arousals. EKG showed NSR. No medication was taken. No oxygen was applied.                       Reggy Salt Diplomate, Biomedical Engineer of Sleep Medicine  ELECTRONICALLY SIGNED ON:  03/04/2024, 1:02 PM [facility] PH: [phone]   FX: [phone] ACCREDITED BY THE [employer] OF SLEEP MEDIC[employer]

## 2024-04-18 ENCOUNTER — Other Ambulatory Visit: Payer: Self-pay | Admitting: Registered Nurse

## 2024-04-18 DIAGNOSIS — G834 Cauda equina syndrome: Secondary | ICD-10-CM
